# Patient Record
Sex: Female | Born: 1945 | Race: White | Hispanic: No | Marital: Married | Smoking: Never smoker
Health system: Southern US, Community
[De-identification: ages and names within clinical notes are randomized; demographics above are authoritative.]

## PROBLEM LIST (undated history)

## (undated) DIAGNOSIS — M1990 Unspecified osteoarthritis, unspecified site: Secondary | ICD-10-CM

## (undated) DIAGNOSIS — Z780 Asymptomatic menopausal state: Secondary | ICD-10-CM

## (undated) DIAGNOSIS — M810 Age-related osteoporosis without current pathological fracture: Secondary | ICD-10-CM

## (undated) DIAGNOSIS — D72820 Lymphocytosis (symptomatic): Secondary | ICD-10-CM

## (undated) DIAGNOSIS — H9190 Unspecified hearing loss, unspecified ear: Secondary | ICD-10-CM

## (undated) DIAGNOSIS — R2242 Localized swelling, mass and lump, left lower limb: Secondary | ICD-10-CM

## (undated) DIAGNOSIS — G4733 Obstructive sleep apnea (adult) (pediatric): Secondary | ICD-10-CM

## (undated) DIAGNOSIS — E559 Vitamin D deficiency, unspecified: Secondary | ICD-10-CM

## (undated) DIAGNOSIS — E785 Hyperlipidemia, unspecified: Secondary | ICD-10-CM

## (undated) DIAGNOSIS — G4730 Sleep apnea, unspecified: Secondary | ICD-10-CM

## (undated) DIAGNOSIS — K219 Gastro-esophageal reflux disease without esophagitis: Secondary | ICD-10-CM

## (undated) DIAGNOSIS — R42 Dizziness and giddiness: Secondary | ICD-10-CM

## (undated) DIAGNOSIS — J309 Allergic rhinitis, unspecified: Secondary | ICD-10-CM

## (undated) DIAGNOSIS — R87811 Vaginal high risk human papillomavirus (HPV) DNA test positive: Secondary | ICD-10-CM

## (undated) DIAGNOSIS — T7840XA Allergy, unspecified, initial encounter: Secondary | ICD-10-CM

## (undated) DIAGNOSIS — R87620 Atypical squamous cells of undetermined significance on cytologic smear of vagina (ASC-US): Secondary | ICD-10-CM

## (undated) DIAGNOSIS — M8580 Other specified disorders of bone density and structure, unspecified site: Secondary | ICD-10-CM

## (undated) DIAGNOSIS — J343 Hypertrophy of nasal turbinates: Secondary | ICD-10-CM

## (undated) DIAGNOSIS — Z91018 Allergy to other foods: Secondary | ICD-10-CM

## (undated) DIAGNOSIS — Z8719 Personal history of other diseases of the digestive system: Secondary | ICD-10-CM

## (undated) DIAGNOSIS — Z9989 Dependence on other enabling machines and devices: Secondary | ICD-10-CM

## (undated) DIAGNOSIS — E041 Nontoxic single thyroid nodule: Secondary | ICD-10-CM

## (undated) DIAGNOSIS — Z66 Do not resuscitate: Secondary | ICD-10-CM

## (undated) DIAGNOSIS — I10 Essential (primary) hypertension: Secondary | ICD-10-CM

## (undated) HISTORY — DX: Hyperlipidemia, unspecified: E78.5

## (undated) HISTORY — DX: Allergic rhinitis, unspecified: J30.9

## (undated) HISTORY — DX: Unspecified hearing loss, unspecified ear: H91.90

## (undated) HISTORY — DX: Other specified disorders of bone density and structure, unspecified site: M85.80

## (undated) HISTORY — DX: Atypical squamous cells of undetermined significance on cytologic smear of vagina (ASC-US): R87.811

## (undated) HISTORY — DX: Obstructive sleep apnea (adult) (pediatric): G47.33

## (undated) HISTORY — DX: Allergy to other foods: Z91.018

## (undated) HISTORY — DX: Nontoxic single thyroid nodule: E04.1

## (undated) HISTORY — DX: Sleep apnea, unspecified: G47.30

## (undated) HISTORY — DX: Dizziness and giddiness: R42

## (undated) HISTORY — DX: Personal history of other diseases of the digestive system: Z87.19

## (undated) HISTORY — DX: Hypertrophy of nasal turbinates: J34.3

## (undated) HISTORY — DX: Vaginal high risk human papillomavirus (HPV) DNA test positive: R87.620

## (undated) HISTORY — PX: BREAST CYST ASPIRATION: SHX578

## (undated) HISTORY — DX: Do not resuscitate: Z66

## (undated) HISTORY — PX: CHOLECYSTECTOMY: SHX55

## (undated) HISTORY — DX: Dependence on other enabling machines and devices: Z99.89

## (undated) HISTORY — DX: Essential (primary) hypertension: I10

## (undated) HISTORY — DX: Unspecified osteoarthritis, unspecified site: M19.90

## (undated) HISTORY — DX: Allergy, unspecified, initial encounter: T78.40XA

## (undated) HISTORY — DX: Vitamin D deficiency, unspecified: E55.9

## (undated) HISTORY — DX: Lymphocytosis (symptomatic): D72.820

## (undated) HISTORY — DX: Asymptomatic menopausal state: Z78.0

## (undated) HISTORY — DX: Gastro-esophageal reflux disease without esophagitis: K21.9

---
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
## 1980-09-09 HISTORY — PX: DILATION AND CURETTAGE OF UTERUS: SHX78

## 1988-09-09 HISTORY — PX: CHOLECYSTECTOMY: SHX55

## 2008-04-25 HISTORY — PX: COLONOSCOPY: SHX174

## 2008-04-25 LAB — HM COLONOSCOPY

## 2011-08-13 DIAGNOSIS — M7741 Metatarsalgia, right foot: Secondary | ICD-10-CM | POA: Insufficient documentation

## 2012-04-15 DIAGNOSIS — R1030 Lower abdominal pain, unspecified: Secondary | ICD-10-CM | POA: Insufficient documentation

## 2012-08-13 DIAGNOSIS — Z87898 Personal history of other specified conditions: Secondary | ICD-10-CM | POA: Insufficient documentation

## 2012-08-13 DIAGNOSIS — H9190 Unspecified hearing loss, unspecified ear: Secondary | ICD-10-CM | POA: Insufficient documentation

## 2012-10-27 DIAGNOSIS — G4733 Obstructive sleep apnea (adult) (pediatric): Secondary | ICD-10-CM | POA: Insufficient documentation

## 2013-03-30 DIAGNOSIS — M79673 Pain in unspecified foot: Secondary | ICD-10-CM | POA: Insufficient documentation

## 2013-03-30 DIAGNOSIS — M659 Synovitis and tenosynovitis, unspecified: Secondary | ICD-10-CM | POA: Insufficient documentation

## 2013-06-09 DIAGNOSIS — M8580 Other specified disorders of bone density and structure, unspecified site: Secondary | ICD-10-CM

## 2013-06-09 HISTORY — DX: Other specified disorders of bone density and structure, unspecified site: M85.80

## 2013-07-05 LAB — HM DEXA SCAN

## 2013-07-14 ENCOUNTER — Ambulatory Visit: Payer: Self-pay | Admitting: Family Medicine

## 2013-09-09 HISTORY — PX: CARPAL TUNNEL RELEASE: SHX101

## 2013-09-10 ENCOUNTER — Ambulatory Visit: Payer: Self-pay | Admitting: Internal Medicine

## 2013-10-07 ENCOUNTER — Ambulatory Visit: Payer: Self-pay | Admitting: Family Medicine

## 2013-10-07 LAB — HM MAMMOGRAPHY

## 2014-02-14 DIAGNOSIS — Z9889 Other specified postprocedural states: Secondary | ICD-10-CM | POA: Insufficient documentation

## 2014-10-06 DIAGNOSIS — G4733 Obstructive sleep apnea (adult) (pediatric): Secondary | ICD-10-CM | POA: Diagnosis not present

## 2014-10-07 DIAGNOSIS — R42 Dizziness and giddiness: Secondary | ICD-10-CM | POA: Diagnosis not present

## 2014-10-07 DIAGNOSIS — J329 Chronic sinusitis, unspecified: Secondary | ICD-10-CM | POA: Diagnosis not present

## 2014-10-28 DIAGNOSIS — J343 Hypertrophy of nasal turbinates: Secondary | ICD-10-CM | POA: Diagnosis not present

## 2014-10-28 DIAGNOSIS — K219 Gastro-esophageal reflux disease without esophagitis: Secondary | ICD-10-CM | POA: Diagnosis not present

## 2014-10-28 DIAGNOSIS — I10 Essential (primary) hypertension: Secondary | ICD-10-CM | POA: Diagnosis not present

## 2014-10-28 DIAGNOSIS — E780 Pure hypercholesterolemia: Secondary | ICD-10-CM | POA: Diagnosis not present

## 2014-10-28 DIAGNOSIS — M8580 Other specified disorders of bone density and structure, unspecified site: Secondary | ICD-10-CM | POA: Diagnosis not present

## 2014-11-09 DIAGNOSIS — K219 Gastro-esophageal reflux disease without esophagitis: Secondary | ICD-10-CM | POA: Diagnosis not present

## 2014-11-24 ENCOUNTER — Ambulatory Visit: Payer: Self-pay | Admitting: Gastroenterology

## 2014-11-24 DIAGNOSIS — R12 Heartburn: Secondary | ICD-10-CM | POA: Diagnosis not present

## 2014-11-24 DIAGNOSIS — K2970 Gastritis, unspecified, without bleeding: Secondary | ICD-10-CM | POA: Diagnosis not present

## 2014-11-24 DIAGNOSIS — K3189 Other diseases of stomach and duodenum: Secondary | ICD-10-CM | POA: Diagnosis not present

## 2014-11-24 DIAGNOSIS — K219 Gastro-esophageal reflux disease without esophagitis: Secondary | ICD-10-CM | POA: Diagnosis not present

## 2014-11-24 DIAGNOSIS — K319 Disease of stomach and duodenum, unspecified: Secondary | ICD-10-CM | POA: Diagnosis not present

## 2014-11-24 DIAGNOSIS — K449 Diaphragmatic hernia without obstruction or gangrene: Secondary | ICD-10-CM | POA: Diagnosis not present

## 2014-11-30 DIAGNOSIS — Z23 Encounter for immunization: Secondary | ICD-10-CM | POA: Diagnosis not present

## 2014-11-30 DIAGNOSIS — Z0000 Encounter for general adult medical examination without abnormal findings: Secondary | ICD-10-CM | POA: Diagnosis not present

## 2014-11-30 DIAGNOSIS — D72820 Lymphocytosis (symptomatic): Secondary | ICD-10-CM | POA: Diagnosis not present

## 2014-12-28 DIAGNOSIS — R87619 Unspecified abnormal cytological findings in specimens from cervix uteri: Secondary | ICD-10-CM | POA: Diagnosis not present

## 2014-12-28 DIAGNOSIS — R87810 Cervical high risk human papillomavirus (HPV) DNA test positive: Secondary | ICD-10-CM | POA: Diagnosis not present

## 2014-12-28 DIAGNOSIS — Z0000 Encounter for general adult medical examination without abnormal findings: Secondary | ICD-10-CM | POA: Diagnosis not present

## 2014-12-28 LAB — HM PAP SMEAR

## 2015-01-03 ENCOUNTER — Other Ambulatory Visit: Payer: Self-pay

## 2015-01-03 DIAGNOSIS — Z1231 Encounter for screening mammogram for malignant neoplasm of breast: Secondary | ICD-10-CM

## 2015-01-06 ENCOUNTER — Ambulatory Visit: Payer: Medicare Other | Attending: Family Medicine | Admitting: Rehabilitative and Restorative Service Providers"

## 2015-01-06 ENCOUNTER — Encounter: Payer: Self-pay | Admitting: Rehabilitative and Restorative Service Providers"

## 2015-01-06 DIAGNOSIS — R269 Unspecified abnormalities of gait and mobility: Secondary | ICD-10-CM

## 2015-01-06 DIAGNOSIS — H8111 Benign paroxysmal vertigo, right ear: Secondary | ICD-10-CM

## 2015-01-06 NOTE — Therapy (Signed)
[facility] [address] [place], [place], [address] Phone: [phone]   Fax:  [phone]  Physical Therapy Evaluation  Patient Details  Name: Kathy Baldwin MRN: [id number] Date of Birth: 1946-04-02 Referring Provider:  Kerman Passey, MD  Encounter Date: 01/06/2015      PT End of Session - 01/06/15 1605    Visit Number 1   Number of Visits 8   Date for PT Re-Evaluation 03/08/15   Authorization Type G code every 10th visit   PT Start Time 1018   PT Stop Time 1105   PT Time Calculation (min) 47 min   Activity Tolerance Patient tolerated treatment well   Behavior During Therapy [facility] for tasks assessed/performed      Past Medical History  Diagnosis Date  . Hypertension     Past Surgical History  Procedure Laterality Date  . Carpal tunnel release Right 2015  . Cholecystectomy  15-20 years ago    There were no vitals filed for this visit.  Visit Diagnosis:  BPPV (benign paroxysmal positional vertigo), right  Abnormality of gait      Subjective Assessment - 01/06/15 1023    Subjective The patient reports onset of intermittent vertigo attacks that began 2-3 months ago.  Symptoms are describd as sensation of room spinning (first attack occurred when looking up), disorientation, imbalance lasting x hours.  She reports these attacks occur every few days up to a month apart.  She goes to bed for the day when these occur.  She also notes pressure, "fuzzy" sensation, difficulty thinking, reports of hearing diminished (hearing checked in 2014 with mild hearing loss), no vision changes, negative h/o migraines.  She is avoiding quick turns, quick head movements.   Patient Stated Goals Decrease attacks of vertigo.   Currently in Pain? No/denies         [facility] PT Assessment - 01/06/15 1034    Assessment   Medical Diagnosis --  vertigo   Onset Date --  10/2013   Precautions   Precautions Fall   Balance  Screen   Has the patient fallen in the past 6 months Yes   How many times? --  slide down 4-5 x   Has the patient had a decrease in activity level because of a fear of falling?  Yes   Is the patient reluctant to leave their home because of a fear of falling?  Yes   Home Environment   Living Enviornment Private residence   Living Arrangements Spouse/significant other   Type of Home House   Home Access Stairs to enter   Entrance Stairs-Number of Steps --  3   Home Layout Two level   Additional Comments has to go up/down steps on her bottom   Prior Function   Level of Independence Independent with homemaking with ambulation   Vocation Retired            Vestibular Assessment - 01/06/15 1037    Vestibular Assessment   General Observation reports R ear fullness, with popping noise   Symptom Behavior   Type of Dizziness Spinning  disoriented in space, oscillopsia   Frequency of Dizziness --  intermittent   Duration of Dizziness hours   Aggravating Factors Turning head quickly;Turning body quickly   Relieving Factors Lying supine  on a bad day   Occulomotor Exam   Occulomotor Alignment Abnormal  R eye skew deviation   Spontaneous Absent   Gaze-induced Left beating nystagmus with L gaze  in superior range, saw with frenzels in L gaze   Head shaking Horizontal --  provokes dizziness, no nystagmus noted   Smooth Pursuits Intact   Comment patient describes usually feeling the right side is affected,    Vestibulo-Occular Reflex   VOR 1 Head Only (x 1 viewing) --  at self regulated pace provokes dizziness   VOR Cancellation Normal   Positional Testing   Dix-Hallpike Dix-Hallpike Right;Dix-Hallpike Left   Sidelying Test Sidelying Right;Sidelying Left   Horizontal Canal Testing Horizontal Canal Right;Horizontal Canal Left   Dix-Hallpike Right   Dix-Hallpike Right Duration --  seconds   Dix-Hallpike Right Symptoms Upbeat, right rotatory nystagmus   Dix-Hallpike Left    Dix-Hallpike Left Symptoms No nystagmus   Sidelying Right   Sidelying Right Duration --  approx 30 sec, going down, 7/10 going up   Sidelying Right Symptoms Upbeat, right rotatory nystagmus   Sidelying Left   Sidelying Left Duration --  approx 10 seconds, 4/10 when returns to sitting   Sidelying Left Symptoms Upbeat, left rotatory nystagmus   Horizontal Canal Right   Horizontal Canal Right Symptoms Normal   Horizontal Canal Left   Horizontal Canal Left Symptoms Normal           Vestibular Treatment/Exercise - 01/06/15 1101    Vestibular Treatment/Exercise   Vestibular Treatment Provided Canalith Repositioning   Canalith Repositioning Epley Manuever Right    EPLEY MANUEVER RIGHT   Number of Reps  2   Overall Response Improved Symptoms   Response Details  --  but did not resolve symptoms           PT Long Term Goals - 01/06/15 1614    PT LONG TERM GOAL #1   Title The patient will return demo HEP for habituation, gaze x 1 and high level balance.   Baseline Target date 02/05/2015   Time 4   Period Weeks   PT LONG TERM GOAL #2   Title The patient will be negative for positional testing for vertigo.   Baseline Target date 02/05/2015   Time 4   Period Weeks   PT LONG TERM GOAL #3   Title The patient will tolerate gaze x 1 viewing x 30 seconds with dizziness < or equal to 3/10.   Baseline Target date 02/05/2015   Time 4   Period Weeks   PT LONG TERM GOAL #4   Title The patient will be further assessed on sensory organization testing and goal to follow as indicated.   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  PT to address deficits through vestibular rehab.   Pt will benefit from skilled therapeutic intervention in order to  improve on the following deficits Abnormal gait;Decreased balance;Difficulty walking;Other (comment)  vertigo   Rehab Potential Good   PT Frequency 2x / week   PT Duration 4 weeks   PT Treatment/Interventions Gait training;Therapeutic exercise;Patient/family education;Balance training;Functional mobility training;Neuromuscular re-education;Other (comment)  vestibular rehab   PT Next Visit Plan Check R BPPV, provide brandt daroff, gaze x 1 viewing, balance exercises, check SOT?   Consulted and Agree with Plan of Care Patient          G-Codes - 01/06/15  1620    Functional Assessment Tool Used vertigo   Functional Limitation Self care   Self Care Current Status [phone]) At least 20 percent but less than 40 percent impaired, limited or restricted   Self Care Goal Status (U0454) At least 1 percent but less than 20 percent impaired, limited or restricted       Problem List There are no active problems to display for this patient.   Khamryn Calderone, PT 01/06/2015, 4:20 PM  [place] [facility] [address] [place], [place], [address] Phone: [phone]   Fax:  [phone]

## 2015-01-09 ENCOUNTER — Encounter: Payer: Self-pay | Admitting: Physical Therapy

## 2015-01-09 ENCOUNTER — Ambulatory Visit: Payer: Medicare Other | Attending: Family Medicine | Admitting: Physical Therapy

## 2015-01-09 DIAGNOSIS — H8111 Benign paroxysmal vertigo, right ear: Secondary | ICD-10-CM | POA: Insufficient documentation

## 2015-01-09 DIAGNOSIS — R269 Unspecified abnormalities of gait and mobility: Secondary | ICD-10-CM | POA: Insufficient documentation

## 2015-01-09 NOTE — Patient Instructions (Addendum)
Benign Positional Vertigo Vertigo means you feel like you or your surroundings are moving when they are not. Benign positional vertigo is the most common form of vertigo. Benign means that the cause of your condition is not serious. Benign positional vertigo is more common in older adults. CAUSES  Benign positional vertigo is the result of an upset in the labyrinth system. This is an area in the middle ear that helps control your balance. This may be caused by a viral infection, head injury, or repetitive motion. However, often no specific cause is found. SYMPTOMS  Symptoms of benign positional vertigo occur when you move your head or eyes in different directions. Some of the symptoms may include:  Loss of balance and falls.  Vomiting.  Blurred vision.  Dizziness.  Nausea.  Involuntary eye movements (nystagmus). DIAGNOSIS  Benign positional vertigo is usually diagnosed by physical exam. If the specific cause of your benign positional vertigo is unknown, your caregiver may perform imaging tests, such as magnetic resonance imaging (MRI) or computed tomography (CT). TREATMENT  Your caregiver may recommend movements or procedures to correct the benign positional vertigo. Medicines such as meclizine, benzodiazepines, and medicines for nausea may be used to treat your symptoms. In rare cases, if your symptoms are caused by certain conditions that affect the inner ear, you may need surgery. HOME CARE INSTRUCTIONS   Follow your caregiver's instructions.  Move slowly. Do not make sudden body or head movements.  Avoid driving.  Avoid operating heavy machinery.  Avoid performing any tasks that would be dangerous to you or others during a vertigo episode.  Drink enough fluids to keep your urine clear or pale yellow. SEEK IMMEDIATE MEDICAL CARE IF:   You develop problems with walking, weakness, numbness, or using your arms, hands, or legs.  You have difficulty speaking.  You develop  severe headaches.  Your nausea or vomiting continues or gets worse.  You develop visual changes.  Your family or friends notice any behavioral changes.  Your condition gets worse.  You have a fever.  You develop a stiff neck or sensitivity to light. MAKE SURE YOU:   Understand these instructions.  Will watch your condition.  Will get help right away if you are not doing well or get worse. Document Released: 06/03/2006 Document Revised: 11/18/2011 Document Reviewed: 05/16/2011 [facility] Patient Information 2015 [employer] [place]. This information is not intended to replace advice given to you by your health care provider. Make sure you discuss any questions you have with your health care provider. Self Treatment for Left Posterior / Anterior Canalithiasis   Sitting on bed: 1. Turn head 45 left. (a) Lie back slowly, shoulders on pillow, head on bed. (b) Hold __30__ seconds. 2. Keeping head on bed, turn head 90 right. Hold __30__ seconds. 3. Roll to right, head on 45 angle down toward bed. Hold _30___ seconds. 4. Sit up on right side of bed. Repeat __5__ times per session. Do __3-5__ sessions per day.  Copyright  VHI. All rights reserved.  SINGLE LIMB STANCE   Stance: single leg on floor. Raise leg. Hold ___ seconds. Repeat with other leg. ___ reps per set, ___ sets per day, ___ days per week  Copyright  VHI. All rights reserved.

## 2015-01-09 NOTE — Therapy (Signed)
[place] [address] [place], [place], [address] Phone: [phone]   Fax:  [phone]  Physical Therapy Treatment  Patient Details  Name: Kathy Baldwin MRN: [id number] Date of Birth: 10-16-1945 Referring Provider:  Arnetha Courser, MD  Encounter Date: 01/09/2015      PT End of Session - 01/09/15 0845    Visit Number 2  G2   Number of Visits 8   Date for PT Re-Evaluation 03/08/15   Authorization Type G code every 10th visit   PT Start Time 0800   PT Stop Time 0840   PT Time Calculation (min) 40 min      Past Medical History  Diagnosis Date  . Hypertension     Past Surgical History  Procedure Laterality Date  . Carpal tunnel release Right 2015  . Cholecystectomy  15-20 years ago    There were no vitals filed for this visit.  Visit Diagnosis:  BPPV (benign paroxysmal positional vertigo), right      Subjective Assessment - 01/09/15 0839    Subjective Pt. reports very little vertigo has been experienced since treatment received (Epley's) on Friday; states husband did the Epley maneuver for her on Saturday at which time she had a little vertigo; states she has some pressure in forehead which may be due to sinus problem   Patient is accompained by: Family member  husband   Patient Stated Goals Decrease attacks of vertigo.   Currently in Pain? No/denies     Pt. Performed standing balance on foam in corner with EO and EC and with head turns - no significant LOB occurred Instructed in Brandt-Daroff - pt. Performed 1 rep with no c/o vertigo ; instructed in balance exercises - tandem stance, Single limb stance, ankle sways and marching on floor and on compliant surface; also standing with narrow BOS on floor And on pillow with head turns - no LOB ocurred Husband present - both pt and spouse stated that they had taken balance classes at [employer]           Vestibular Assessment -  01/09/15 0849    Positional Testing   Dix-Hallpike Dix-Hallpike Right;Dix-Hallpike Left   Sidelying Test Sidelying Right;Sidelying Left   Dix-Hallpike Right   Dix-Hallpike Right Duration No vertigo   Dix-Hallpike Left   Dix-Hallpike Left Duration No vertigo   Sidelying Right   Sidelying Right Duration no vertigo   Sidelying Left   Sidelying Left Duration no vertigo   Positional Sensitivities   Rolling Right No dizziness   Rolling Left No dizziness                         PT Education - 01/09/15 0844    Education provided Yes   Education Details BPPV information; Brandt-Daroff exercises and balance exercises including single limb and tandem stance   Person(s) Educated Patient;Spouse   Methods Explanation;Demonstration;Handout   Comprehension Verbalized understanding;Returned demonstration          PT Short Term Goals - 01/06/15 1609    PT SHORT TERM GOAL #1   Title --           PT Long Term Goals - 01/09/15 0856    PT LONG TERM GOAL #2   Title The patient will be negative for positional testing for vertigo.   Baseline 01-09-15 met   Status Achieved  Plan - 01/09/15 0852    Clinical Impression Statement Pt.'s R BPPV appears to have resolved with treatment of Epley's maneuver; pt. has no c/o vertigo and no nystagmus observed with Dix-Hallpike, Brandt-Daroff, or rolling during positional testing; pt able to maintain balance on foam iwth headturns and with EC and EO - no signiificant LOB occurred    Pt will benefit from skilled therapeutic intervention in order to improve on the following deficits Abnormal gait;Decreased balance;Difficulty walking;Other (comment)   Rehab Potential Good   PT Frequency 2x / week   PT Duration 4 weeks   PT Treatment/Interventions Gait training;Therapeutic exercise;Patient/family education;Balance training;Functional mobility training;Neuromuscular re-education;Other (comment)   PT Next Visit Plan Pt.  wishes to cancel remaining appt. this week due to not having vertigo at this time - states she wishes to be placed on hold and will call back for appt as needed   Consulted and Agree with Plan of Care Patient;Family member/caregiver   Family Member Consulted spouse        Problem List There are no active problems to display for this patient.   Alda Lea, PT 01/09/2015, 9:03 AM  [facility] [address] [place], [place], [address] Phone: [phone]   Fax:  [phone]

## 2015-01-12 ENCOUNTER — Encounter: Payer: Medicare Other | Admitting: Rehabilitative and Restorative Service Providers"

## 2015-01-13 ENCOUNTER — Ambulatory Visit
Admission: RE | Admit: 2015-01-13 | Discharge: 2015-01-13 | Disposition: A | Discharge status: Home / Self Care | Payer: Medicare Other | Source: Ambulatory Visit | Attending: Family Medicine | Admitting: Family Medicine

## 2015-01-13 ENCOUNTER — Ambulatory Visit: Payer: Self-pay

## 2015-01-13 ENCOUNTER — Other Ambulatory Visit: Payer: Self-pay

## 2015-01-13 ENCOUNTER — Ambulatory Visit: Payer: Self-pay | Admitting: Physical Therapy

## 2015-01-13 DIAGNOSIS — Z1231 Encounter for screening mammogram for malignant neoplasm of breast: Secondary | ICD-10-CM | POA: Insufficient documentation

## 2015-01-17 ENCOUNTER — Encounter: Payer: Medicare Other | Admitting: Physical Therapy

## 2015-01-20 ENCOUNTER — Encounter: Payer: Medicare Other | Admitting: Rehabilitative and Restorative Service Providers"

## 2015-01-23 ENCOUNTER — Encounter: Payer: Medicare Other | Admitting: Rehabilitative and Restorative Service Providers"

## 2015-01-25 ENCOUNTER — Encounter: Payer: Medicare Other | Admitting: Rehabilitative and Restorative Service Providers"

## 2015-01-30 ENCOUNTER — Encounter: Payer: Medicare Other | Admitting: Rehabilitative and Restorative Service Providers"

## 2015-01-30 DIAGNOSIS — G4733 Obstructive sleep apnea (adult) (pediatric): Secondary | ICD-10-CM | POA: Diagnosis not present

## 2015-02-01 ENCOUNTER — Encounter: Payer: Medicare Other | Admitting: Rehabilitative and Restorative Service Providers"

## 2015-02-14 ENCOUNTER — Encounter: Payer: Medicare Other | Admitting: Rehabilitative and Restorative Service Providers"

## 2015-02-17 ENCOUNTER — Encounter: Payer: Medicare Other | Admitting: Rehabilitative and Restorative Service Providers"

## 2015-03-02 ENCOUNTER — Other Ambulatory Visit: Payer: Self-pay | Admitting: Family Medicine

## 2015-03-03 NOTE — Telephone Encounter (Signed)
Routing to provider  

## 2015-03-10 ENCOUNTER — Encounter: Payer: Self-pay | Admitting: Rehabilitative and Restorative Service Providers"

## 2015-03-10 NOTE — Therapy (Signed)
[facility] [address] [place], [place], [address] Phone: [phone]   Fax:  [phone]  Patient Details  Name: Kathy Baldwin MRN: [id number] Date of Birth: [birth year] Referring Provider:  No ref. provider found  Encounter Date: last encounter on 01/09/2015  PHYSICAL THERAPY DISCHARGE SUMMARY  Visits from Start of Care: 2  Current functional level related to goals / functional outcomes:     PT Long Term Goals - 01/09/15 0856    PT LONG TERM GOAL #2   Title The patient will be negative for positional testing for vertigo.   Baseline 01-09-15 met   Status Achieved        Remaining deficits: None noted on second visit, patient had cancelled remaining visits.   Education / Equipment: HEP for vertigo.  Plan: Patient agrees to discharge.  Patient goals were met. Patient is being discharged due to meeting the stated rehab goals.  ?????        Debra Colon, PT 03/10/2015, 9:45 AM  [facility] [address] [place], [place], [address] Phone: [phone]   Fax:  [phone]

## 2015-03-28 ENCOUNTER — Telehealth: Payer: Self-pay

## 2015-03-28 ENCOUNTER — Other Ambulatory Visit: Payer: Self-pay

## 2015-03-28 ENCOUNTER — Other Ambulatory Visit: Payer: Self-pay | Admitting: Family Medicine

## 2015-03-28 DIAGNOSIS — J309 Allergic rhinitis, unspecified: Secondary | ICD-10-CM | POA: Insufficient documentation

## 2015-03-28 DIAGNOSIS — D72820 Lymphocytosis (symptomatic): Secondary | ICD-10-CM

## 2015-03-28 DIAGNOSIS — K2100 Gastro-esophageal reflux disease with esophagitis, without bleeding: Secondary | ICD-10-CM

## 2015-03-28 DIAGNOSIS — J3089 Other allergic rhinitis: Secondary | ICD-10-CM

## 2015-03-28 DIAGNOSIS — E785 Hyperlipidemia, unspecified: Secondary | ICD-10-CM

## 2015-03-28 DIAGNOSIS — Z5181 Encounter for therapeutic drug level monitoring: Secondary | ICD-10-CM | POA: Insufficient documentation

## 2015-03-28 MED ORDER — OMEPRAZOLE 40 MG PO CPDR: 40.0000 mg | DELAYED_RELEASE_CAPSULE | Freq: Every day | ORAL | Status: DC

## 2015-03-28 MED ORDER — RABEPRAZOLE SODIUM 20 MG PO TBEC: 20.0000 mg | DELAYED_RELEASE_TABLET | Freq: Every day | ORAL | Status: DC

## 2015-03-28 NOTE — Telephone Encounter (Signed)
I sent refills as requested Please ask the patient to come in for fasting labs on or after august 22nd; orders entered

## 2015-03-28 NOTE — Telephone Encounter (Signed)
Routing to provider  

## 2015-03-28 NOTE — Telephone Encounter (Signed)
See notes below from [employer] regarding new rx request. [place]ip[place] 20mg  [place] [facility] [place]. Pt advised if this doesn't work then we will need a PH study.     Pt already has paid for a 90 day supply of Dexilant. Will call back when she completes the rx for a new rx for Aciphex. Pt requested I call to get a tier exception for her Dexilant. GF  > From: Midge MiniumWOHL, DARREN MD > To: Hajer Dwyer > Sent: 01/09/2015 5:28 PM > Yes but if that does not work then she may need pH studies to see if reflux is really the problem.  > From: Joanathan Affeldt > To: Midge MiniumWOHL, DARREN MD > Sent: 01/06/2015 4:12 PM > Pt has already taken and failed Pantoprazole, Zantac and Nexium. Can we try Aciphex?  > From: Allyn, Shaunna > To: Khamila Bassinger > Sent: 01/06/2015 4:01 PM > [phone] pt went to get Dexilent and it was 500.00. Can she get something cheaper?

## 2015-03-29 ENCOUNTER — Other Ambulatory Visit: Payer: Self-pay

## 2015-03-29 DIAGNOSIS — J3089 Other allergic rhinitis: Secondary | ICD-10-CM

## 2015-03-29 NOTE — Telephone Encounter (Signed)
She needs a 3 month supply on flonase.

## 2015-03-29 NOTE — Telephone Encounter (Signed)
No answer

## 2015-03-29 NOTE — Telephone Encounter (Signed)
Patient notified, lab appointment scheduled

## 2015-03-30 MED ORDER — FLUTICASONE PROPIONATE 50 MCG/ACT NA SUSP: 2.0000 | Freq: Every day | NASAL | Status: DC

## 2015-04-04 ENCOUNTER — Telehealth: Payer: Self-pay | Admitting: Gastroenterology

## 2015-04-04 ENCOUNTER — Other Ambulatory Visit: Payer: Self-pay

## 2015-04-04 DIAGNOSIS — K2100 Gastro-esophageal reflux disease with esophagitis, without bleeding: Secondary | ICD-10-CM

## 2015-04-04 MED ORDER — OMEPRAZOLE 40 MG PO CPDR: 40.0000 mg | DELAYED_RELEASE_CAPSULE | Freq: Every day | ORAL | Status: DC

## 2015-04-04 NOTE — Telephone Encounter (Signed)
Patient would like her reflux medication called in to the mail pharmacy [facility] and please call patient when done

## 2015-04-04 NOTE — Telephone Encounter (Signed)
Rx sent to [facility]. Pt has been notified.

## 2015-05-02 ENCOUNTER — Other Ambulatory Visit: Payer: Medicare Other

## 2015-05-02 DIAGNOSIS — E785 Hyperlipidemia, unspecified: Secondary | ICD-10-CM

## 2015-05-02 DIAGNOSIS — D72820 Lymphocytosis (symptomatic): Secondary | ICD-10-CM

## 2015-05-02 DIAGNOSIS — Z5181 Encounter for therapeutic drug level monitoring: Secondary | ICD-10-CM | POA: Diagnosis not present

## 2015-05-03 LAB — LIPID PANEL W/O CHOL/HDL RATIO
Cholesterol, Total: 187 mg/dL (ref 100–199)
HDL: 72 mg/dL (ref >39)
LDL Calculated: 95 mg/dL (ref 0–99)
TRIGLYCERIDES: 101 mg/dL (ref 0–149)
VLDL CHOLESTEROL CAL: 20 mg/dL (ref 5–40)

## 2015-05-03 LAB — COMPREHENSIVE METABOLIC PANEL
ALK PHOS: 107 IU/L (ref 39–117)
ALT: 17 IU/L (ref 0–32)
AST: 25 IU/L (ref 0–40)
Albumin/Globulin Ratio: 1.7 (ref 1.1–2.5)
Albumin: 4.3 g/dL (ref 3.5–4.8)
BILIRUBIN TOTAL: 0.7 mg/dL (ref 0.0–1.2)
BUN / CREAT RATIO: 15 (ref 11–26)
BUN: 13 mg/dL (ref 8–27)
CHLORIDE: 103 mmol/L (ref 97–108)
CO2: 26 mmol/L (ref 18–29)
CREATININE: 0.86 mg/dL (ref 0.57–1.00)
Calcium: 9.9 mg/dL (ref 8.7–10.3)
GFR calc Af Amer: 79 mL/min/{1.73_m2} (ref >59)
GFR calc non Af Amer: 69 mL/min/{1.73_m2} (ref >59)
GLOBULIN, TOTAL: 2.5 g/dL (ref 1.5–4.5)
Glucose: 95 mg/dL (ref 65–99)
POTASSIUM: 5 mmol/L (ref 3.5–5.2)
SODIUM: 144 mmol/L (ref 134–144)
Total Protein: 6.8 g/dL (ref 6.0–8.5)

## 2015-05-03 LAB — CBC WITH DIFFERENTIAL/PLATELET
BASOS: 0 %
Basophils Absolute: 0 10*3/uL (ref 0.0–0.2)
EOS (ABSOLUTE): 0.2 10*3/uL (ref 0.0–0.4)
EOS: 3 %
HEMATOCRIT: 41.9 % (ref 34.0–46.6)
Hemoglobin: 14.2 g/dL (ref 11.1–15.9)
IMMATURE GRANS (ABS): 0 10*3/uL (ref 0.0–0.1)
IMMATURE GRANULOCYTES: 0 %
LYMPHS: 50 %
Lymphocytes Absolute: 3.6 10*3/uL — ABNORMAL HIGH (ref 0.7–3.1)
MCH: 30.1 pg (ref 26.6–33.0)
MCHC: 33.9 g/dL (ref 31.5–35.7)
MCV: 89 fL (ref 79–97)
Monocytes Absolute: 0.5 10*3/uL (ref 0.1–0.9)
Monocytes: 7 %
NEUTROS PCT: 40 %
Neutrophils Absolute: 2.9 10*3/uL (ref 1.4–7.0)
Platelets: 267 10*3/uL (ref 150–379)
RBC: 4.72 x10E6/uL (ref 3.77–5.28)
RDW: 13.8 % (ref 12.3–15.4)
WBC: 7.3 10*3/uL (ref 3.4–10.8)

## 2015-05-09 ENCOUNTER — Encounter: Payer: Self-pay | Admitting: Family Medicine

## 2015-05-17 DIAGNOSIS — K219 Gastro-esophageal reflux disease without esophagitis: Secondary | ICD-10-CM | POA: Insufficient documentation

## 2015-05-17 DIAGNOSIS — R87811 Vaginal high risk human papillomavirus (HPV) DNA test positive: Secondary | ICD-10-CM

## 2015-05-17 DIAGNOSIS — D72820 Lymphocytosis (symptomatic): Secondary | ICD-10-CM | POA: Insufficient documentation

## 2015-05-17 DIAGNOSIS — J343 Hypertrophy of nasal turbinates: Secondary | ICD-10-CM | POA: Insufficient documentation

## 2015-05-17 DIAGNOSIS — E785 Hyperlipidemia, unspecified: Secondary | ICD-10-CM | POA: Insufficient documentation

## 2015-05-17 DIAGNOSIS — Z9989 Dependence on other enabling machines and devices: Secondary | ICD-10-CM

## 2015-05-17 DIAGNOSIS — I10 Essential (primary) hypertension: Secondary | ICD-10-CM | POA: Insufficient documentation

## 2015-05-17 DIAGNOSIS — R87620 Atypical squamous cells of undetermined significance on cytologic smear of vagina (ASC-US): Secondary | ICD-10-CM | POA: Insufficient documentation

## 2015-05-17 DIAGNOSIS — J309 Allergic rhinitis, unspecified: Secondary | ICD-10-CM | POA: Insufficient documentation

## 2015-05-17 DIAGNOSIS — G4733 Obstructive sleep apnea (adult) (pediatric): Secondary | ICD-10-CM | POA: Insufficient documentation

## 2015-05-22 DIAGNOSIS — G4733 Obstructive sleep apnea (adult) (pediatric): Secondary | ICD-10-CM | POA: Diagnosis not present

## 2015-05-25 ENCOUNTER — Ambulatory Visit (INDEPENDENT_AMBULATORY_CARE_PROVIDER_SITE_OTHER): Payer: Medicare Other | Admitting: Family Medicine

## 2015-05-25 ENCOUNTER — Encounter: Payer: Self-pay | Admitting: Family Medicine

## 2015-05-25 VITALS — BP 132/69 | HR 67 | Temp 98.2°F | Ht 62.4 in | Wt 164.0 lb

## 2015-05-25 DIAGNOSIS — D72820 Lymphocytosis (symptomatic): Secondary | ICD-10-CM

## 2015-05-25 DIAGNOSIS — R590 Localized enlarged lymph nodes: Secondary | ICD-10-CM | POA: Diagnosis not present

## 2015-05-25 DIAGNOSIS — Z23 Encounter for immunization: Secondary | ICD-10-CM

## 2015-05-25 DIAGNOSIS — R61 Generalized hyperhidrosis: Secondary | ICD-10-CM | POA: Diagnosis not present

## 2015-05-25 NOTE — Patient Instructions (Signed)
Please do keep an eye on your lymph nodes and report any swelling of your glands to me or the hematologist right away We'll have you see the specialist in the next few weeks or few months If you have not heard anything from my staff in a week about any orders/referrals/studies from today, please contact us here to follow-up [phone]

## 2015-05-25 NOTE — Assessment & Plan Note (Signed)
Seems to have resolved; patient to keep an eye on this area and other lymph nodes to see if any swelling; get CT scan if any new lymph node enlargement; refer to heme for evaluation

## 2015-05-25 NOTE — Progress Notes (Signed)
BP 132/69 mmHg  Pulse 67  Temp(Src) 98.2 F (36.8 C)  Ht 5' 2.4" (1.585 m)  Wt 164 lb (74.39 kg)  BMI 29.61 kg/m2  SpO2 99%   Subjective:    Patient ID: Kathy Baldwin, female    DOB: 1946-05-11, 69 y.o.   MRN: [id number]  HPI: Kathy Baldwin is a 69 y.o. female  Chief Complaint  Patient presents with  . re check white blood count   No fevers; does have some night sweats; not extreme though she says; she was about 69 years old She did noticed some swollen glands on the right side of the neck a few weeks ago Energy level is good She still has problems with her right ear, but no vertigo stuff any more; learned how to do her own exercises when she gets a little dizzy No diarrhea, no unexplained weight loss No unusual rash Does have some pain in her hands, but had carpal tunnel surgery done and that goes away No chest pain or trouble breathing  Relevant past medical, surgical, family and social history reviewed and updated as indicated. Interim medical history since our last visit reviewed. Allergies and medications reviewed and updated.  Review of Systems  Constitutional: Positive for chills (felt cold in the house a few weeks ago when lymph nodes were swollen in the neck) and diaphoresis (night sweats). Negative for fever, activity change, appetite change and fatigue.  HENT: Positive for postnasal drip. Negative for congestion and dental problem.   Respiratory: Negative for shortness of breath.   Cardiovascular: Negative for chest pain and leg swelling.  Gastrointestinal: Negative for blood in stool.  Genitourinary: Negative for hematuria.  Musculoskeletal:       Pain in the leg and groin; when she was at [facility] and she had scan done, couldn't find anything, no treatment; no worse than when evaluated at [facility]; actually, a lot better  Allergic/Immunologic: Positive for environmental allergies (uses flonase).  Neurological: Negative for tremors and numbness (maybe carpal  tunnel, alleviated with massage).  Hematological: Positive for adenopathy (previous swelling along right side of neck a couple of weeks ago, resolved). Does not bruise/bleed easily (hardly ever has bruises, one bruise on the right arm).  Psychiatric/Behavioral: Negative for sleep disturbance.   Per HPI unless specifically indicated above     Objective:    BP 132/69 mmHg  Pulse 67  Temp(Src) 98.2 F (36.8 C)  Ht 5' 2.4" (1.585 m)  Wt 164 lb (74.39 kg)  BMI 29.61 kg/m2  SpO2 99%  Wt Readings from Last 3 Encounters:  05/25/15 164 lb (74.39 kg)  11/30/14 165 lb (74.844 kg)    Physical Exam  Constitutional: She appears well-developed and well-nourished. No distress.  Weight stable  Eyes: No scleral icterus.  Cardiovascular: Normal rate and regular rhythm.   Pulmonary/Chest: Effort normal and breath sounds normal.  Abdominal: Soft. She exhibits no distension.  Musculoskeletal: She exhibits no edema.  Lymphadenopathy:       Head (right side): No submental, no submandibular, no preauricular and no posterior auricular adenopathy present.       Head (left side): No submental, no submandibular, no preauricular and no posterior auricular adenopathy present.    She has no cervical adenopathy.    She has no axillary adenopathy.  Neurological: She is alert.  Skin: Skin is warm and dry. No bruising, no ecchymosis and no petechiae noted. She is not diaphoretic. No pallor.  Psychiatric: She has a normal mood and affect. Her  speech is normal and behavior is normal. Judgment and thought content normal. Cognition and memory are normal.      Assessment & Plan:   Problem List Items Addressed This Visit      Immune and Lymphatic   Cervical lymphadenopathy    Seems to have resolved; patient to keep an eye on this area and other lymph nodes to see if any swelling; get CT scan if any new lymph node enlargement; refer to heme for evaluation      Relevant Orders   Ambulatory referral to  Hematology     Other   Lymphocytosis - Primary    Discussed and will refer to hematologist for evaluation since she has had some recent lymph node swelling and night sweats; no imaging or additional labs here today since I anticipate they will do repeat CBC, peripheral smear, CLL panel, etc.      Relevant Orders   Ambulatory referral to Hematology    Other Visit Diagnoses    Immunization due        Relevant Orders    Flu vaccine greater than or equal to 3yo preservative free IM (Completed)    Night sweats        Relevant Orders    Ambulatory referral to Hematology       Follow up plan: No Follow-up on file.

## 2015-05-25 NOTE — Assessment & Plan Note (Signed)
Discussed and will refer to hematologist for evaluation since she has had some recent lymph node swelling and night sweats; no imaging or additional labs here today since I anticipate they will do repeat CBC, peripheral smear, CLL panel, etc.

## 2015-05-29 DIAGNOSIS — G4733 Obstructive sleep apnea (adult) (pediatric): Secondary | ICD-10-CM | POA: Diagnosis not present

## 2015-06-05 ENCOUNTER — Inpatient Hospital Stay: Payer: Medicare Other | Attending: Internal Medicine | Admitting: Internal Medicine

## 2015-06-05 ENCOUNTER — Encounter: Payer: Self-pay | Admitting: Internal Medicine

## 2015-06-05 ENCOUNTER — Inpatient Hospital Stay: Payer: Medicare Other

## 2015-06-05 VITALS — BP 133/79 | HR 71 | Temp 97.7°F | Resp 20 | Ht 62.4 in | Wt 163.1 lb

## 2015-06-05 DIAGNOSIS — Z7982 Long term (current) use of aspirin: Secondary | ICD-10-CM

## 2015-06-05 DIAGNOSIS — R221 Localized swelling, mass and lump, neck: Secondary | ICD-10-CM

## 2015-06-05 DIAGNOSIS — G4730 Sleep apnea, unspecified: Secondary | ICD-10-CM | POA: Diagnosis not present

## 2015-06-05 DIAGNOSIS — K219 Gastro-esophageal reflux disease without esophagitis: Secondary | ICD-10-CM | POA: Diagnosis not present

## 2015-06-05 DIAGNOSIS — M8580 Other specified disorders of bone density and structure, unspecified site: Secondary | ICD-10-CM | POA: Diagnosis not present

## 2015-06-05 DIAGNOSIS — H9190 Unspecified hearing loss, unspecified ear: Secondary | ICD-10-CM | POA: Diagnosis not present

## 2015-06-05 DIAGNOSIS — D72820 Lymphocytosis (symptomatic): Secondary | ICD-10-CM | POA: Diagnosis not present

## 2015-06-05 DIAGNOSIS — J343 Hypertrophy of nasal turbinates: Secondary | ICD-10-CM

## 2015-06-05 DIAGNOSIS — Z780 Asymptomatic menopausal state: Secondary | ICD-10-CM | POA: Diagnosis not present

## 2015-06-05 DIAGNOSIS — Z79899 Other long term (current) drug therapy: Secondary | ICD-10-CM | POA: Diagnosis not present

## 2015-06-05 DIAGNOSIS — Z800 Family history of malignant neoplasm of digestive organs: Secondary | ICD-10-CM

## 2015-06-05 DIAGNOSIS — R61 Generalized hyperhidrosis: Secondary | ICD-10-CM

## 2015-06-05 DIAGNOSIS — Z809 Family history of malignant neoplasm, unspecified: Secondary | ICD-10-CM | POA: Diagnosis not present

## 2015-06-05 DIAGNOSIS — E559 Vitamin D deficiency, unspecified: Secondary | ICD-10-CM

## 2015-06-05 LAB — CBC WITH DIFFERENTIAL/PLATELET
BASOS PCT: 1 %
Basophils Absolute: 0.1 10*3/uL (ref 0–0.1)
EOS ABS: 0.2 10*3/uL (ref 0–0.7)
EOS PCT: 3 %
HCT: 41.9 % (ref 35.0–47.0)
Hemoglobin: 14 g/dL (ref 12.0–16.0)
Lymphocytes Relative: 42 %
Lymphs Abs: 2.7 10*3/uL (ref 1.0–3.6)
MCH: 30 pg (ref 26.0–34.0)
MCHC: 33.4 g/dL (ref 32.0–36.0)
MCV: 89.9 fL (ref 80.0–100.0)
MONO ABS: 0.6 10*3/uL (ref 0.2–0.9)
MONOS PCT: 9 %
NEUTROS PCT: 45 %
Neutro Abs: 2.9 10*3/uL (ref 1.4–6.5)
PLATELETS: 248 10*3/uL (ref 150–440)
RBC: 4.66 MIL/uL (ref 3.80–5.20)
RDW: 13.3 % (ref 11.5–14.5)
WBC: 6.5 10*3/uL (ref 3.6–11.0)

## 2015-06-05 LAB — LACTATE DEHYDROGENASE: LDH: 174 U/L (ref 98–192)

## 2015-06-05 NOTE — Progress Notes (Signed)
[facility] CONSULT NOTE  Patient Care Team: Kerman Passey, MD as PCP - General (Family Medicine)  CHIEF COMPLAINTS/PURPOSE OF CONSULTATION:  Neck swelling and night sweats.   HISTORY OF PRESENTING ILLNESS:  Kathy Baldwin 69 y.o. female with a history of mild lymphocytosis noted in August 2016 has been deferred was for further evaluation of neck adenopathy with night sweats.   Skin August 2016-patient had regular CBC that showed normal white count at 7; mild lymphocytosis at 3.6 [normal 3.1]; normal hemoglobin and platelet count.  More recently patient complained of mild neck adenopathy; which is currently resolved. Patient also complained of intermittent "night sweats". However on further questioning she states this suggests mild; and could be related to ambulate temperature changes.  Patient denies any weight loss; fatigue or frequent infections. Denies any skin rashes. Denies any early satiety.  ROS: A complete 10 point review of system is done which is negative except mentioned above in history of present illness  MEDICAL HISTORY:  Past Medical History  Diagnosis Date  . Hypertension   . Hyperlipidemia   . ASCUS with positive high risk human papillomavirus of vagina     colpo done by Dr. Holly Bodily 2010  . Osteopenia Oct. 2014  . Allergic rhinitis   . GERD (gastroesophageal reflux disease)   . Vitamin D deficiency disease   . OSA on CPAP   . Lymphocytosis   . Hypertrophy of nasal turbinates   . Menopause age 65  . Multiple food allergies     chocolate and cabbage  . Vertigo   . Sleep apnea   . GERD (gastroesophageal reflux disease)   . History of gallstones   . Hearing loss     SURGICAL HISTORY: Past Surgical History  Procedure Laterality Date  . Carpal tunnel release Right 2015  . Cholecystectomy  15-20 years ago  . Breast cyst aspiration Right     negative  . Dilation and curettage of uterus  1982    s/p miscarriage  . Colonoscopy  04/25/08     SOCIAL HISTORY: Social History   Social History  . Marital Status: Married    Spouse Name: N/A  . Number of Children: N/A  . Years of Education: N/A   Occupational History  . Not on file.   Social History Main Topics  . Smoking status: Never Smoker   . Smokeless tobacco: Never Used  . Alcohol Use: 0.0 oz/week    0 Standard drinks or equivalent per week     Comment: occasional  . Drug Use: No  . Sexual Activity:    Partners: Male   Other Topics Concern  . Not on file   Social History Narrative    FAMILY HISTORY: Family History  Problem Relation Age of Onset  . Hypertension Mother   . Lung disease Father     black lung  . Heart disease Father   . Hyperlipidemia Father   . Hypertension Father   . Diabetes Sister   . Emphysema Sister     died of complications of emphyema  . Cancer Brother     unsure of type  . Asthma Daughter   . Deep vein thrombosis Sister   . Thyroid disease Sister   . Colon cancer Other 60    nephew    ALLERGIES:  is allergic to cabbage; chocolate; and statins.  MEDICATIONS:  Current Outpatient Prescriptions  Medication Sig Dispense Refill  . amLODipine (NORVASC) 5 MG tablet Take 1 tablet by  mouth  daily 90 tablet 2  . aspirin EC 81 MG tablet Take 81 mg by mouth daily.    Marland Kitchen atorvastatin (LIPITOR) 10 MG tablet Take 1 tablet (10 mg total) by mouth at bedtime. 90 tablet 0  . calcium carbonate (OSCAL) 1500 (600 CA) MG TABS tablet Take 600 mg of elemental calcium by mouth 2 (two) times daily with a meal.    . Cyanocobalamin (B-12) 2500 MCG TABS Take 1 tablet by mouth daily.    . fluticasone (FLONASE) 50 MCG/ACT nasal spray Place 2 sprays into both nostrils daily. 48 g 3  . omeprazole (PRILOSEC) 40 MG capsule Take 1 capsule (40 mg total) by mouth daily. 90 capsule 3   No current facility-administered medications for this visit.      Marland Kitchen  PHYSICAL EXAMINATION: ECOG PERFORMANCE STATUS: 0 - Asymptomatic  Filed Vitals:   06/05/15 1103   BP: 133/79  Pulse: 71  Temp: 97.7 F (36.5 C)  Resp: 20   Filed Weights   06/05/15 1103  Weight: 163 lb 2.2 oz (73.999 kg)    GENERAL:alert, no distress and comfortable. She is alone. SKIN: skin color, texture, turgor are normal, no rashes or significant lesions EYES: normal, conjunctiva are pink and non-injected, sclera clear OROPHARYNX:no exudate, no erythema and lips, buccal mucosa, and tongue normal  NECK: supple, thyroid normal size, non-tender, without nodularity LYMPH:  no palpable lymphadenopathy in the cervical, axillary or inguinal LUNGS: clear to auscultation and percussion with normal breathing effort HEART: regular rate & rhythm and no murmurs and no lower extremity edema ABDOMEN:abdomen soft, non-tender and normal bowel sounds Musculoskeletal:no cyanosis of digits and no clubbing  PSYCH: alert & oriented x 3 with fluent speech NEURO: no focal motor/sensory deficits  LABORATORY DATA:  I have reviewed the data as listed Lab Results  Component Value Date   WBC 7.3 05/02/2015   HCT 41.9 05/02/2015    Recent Labs  05/02/15 0819  NA 144  K 5.0  CL 103  CO2 26  GLUCOSE 95  BUN 13  CREATININE 0.86  CALCIUM 9.9  GFRNONAA 69  GFRAA 79  PROT 6.8  AST 25  ALT 17  ALKPHOS 107  BILITOT 0.7    RADIOGRAPHIC STUDIES: I have personally reviewed the radiological images as listed and agreed with the findings in the report. No results found.  ASSESSMENT & PLAN:   # Lymphocytosis- with normal white count and hemoglobin platelets. I suspect this is a transient phenomenon possibly from viral infections etc versus clonal B-cell process. Patient is currently asymptomatic; on physical exam no lymphadenopathy felt today.    Today I would recommend follow-up CBC; LDH.  Further workup/follow-up based on results of today's labs.  All questions were answered. The patient knows to call the clinic with any problems, questions or concerns.  Thank you Dr. Sherie Don for allowing  me to participate in the care of your pleasant patient. Please do not hesitate to contact me with questions or concerns in the interim.   I spent  30 counseling the patient face to face. The total time spent in the appointment was  30 mins and more than 50% was on counseling.     Earna Coder, MD 06/05/2015 11:30 AM

## 2015-06-05 NOTE — Progress Notes (Signed)
Cbc- normal; no follow up unless pt has any worsening symptoms/ signs.

## 2015-06-06 ENCOUNTER — Telehealth: Payer: Self-pay | Admitting: *Deleted

## 2015-06-06 NOTE — Telephone Encounter (Signed)
-----   Message from Earna Coder, MD sent at 06/05/2015  4:44 PM EDT ----- Please inform pt that her blood work has come back normal; hence no further follow up are recommended; unless she has any other worsening symptoms.

## 2015-06-06 NOTE — Telephone Encounter (Signed)
Patient notified that the labs which were drawn yesterday are completely normal.  Patient reassured that no return appointment is needed at this time. The patient expressed gratitude for the call.

## 2015-06-07 DIAGNOSIS — G4733 Obstructive sleep apnea (adult) (pediatric): Secondary | ICD-10-CM | POA: Diagnosis not present

## 2015-07-18 ENCOUNTER — Telehealth: Payer: Self-pay | Admitting: Family Medicine

## 2015-07-18 ENCOUNTER — Encounter: Payer: Self-pay | Admitting: Family Medicine

## 2015-07-18 ENCOUNTER — Ambulatory Visit (INDEPENDENT_AMBULATORY_CARE_PROVIDER_SITE_OTHER): Payer: Medicare Other | Admitting: Family Medicine

## 2015-07-18 VITALS — BP 134/74 | HR 63 | Temp 98.7°F | Wt 161.0 lb

## 2015-07-18 DIAGNOSIS — H8112 Benign paroxysmal vertigo, left ear: Secondary | ICD-10-CM

## 2015-07-18 NOTE — Progress Notes (Signed)
BP 134/74 mmHg  Pulse 63  Temp(Src) 98.7 F (37.1 C)  Wt 161 lb (73.029 kg)  SpO2 97%   Subjective:    Patient ID: Kathy Baldwin, female    DOB: 08/06/1946, 69 y.o.   MRN: [id number]  HPI: Kathy Baldwin is a 69 y.o. female  Chief Complaint  Patient presents with  . Dizziness    Patient states that she has had this in the past, she needs a [facility].   DIZZINESS- had BPPV in May, did PT. Has tried eply's manuver without benefit. Can't take meclizine as it makes her sleep. Has only taken full pill, has not cut in half. Duration: Started on Sunday Description of symptoms: lightheaded Duration of episode: seconds Dizziness frequency: recurrent Provoking factors: head movements Aggravating factors:  Head movements Triggered by rolling over in bed: yes Triggered by bending over: yes Aggravated by head movement: yes Aggravated by exertion, coughing, loud noises: no Recent head injury: no Recent or current viral symptoms: no History of vasovagal episodes: no Nausea: no Vomiting: no Tinnitus: no Hearing loss: no Aural fullness: no Headache: yes Photophobia/phonophobia: no Unsteady gait: yes Postural instability: yes Diplopia, dysarthria, dysphagia or weakness: no Related to exertion: no Pallor: no Diaphoresis: yes Dyspnea: no Chest pain: no  Relevant past medical, surgical, family and social history reviewed and updated as indicated. Interim medical history since our last visit reviewed. Allergies and medications reviewed and updated.  Review of Systems  Constitutional: Negative.   Respiratory: Negative.   Cardiovascular: Negative.   Endocrine: Negative.   Neurological: Positive for dizziness, light-headedness and headaches. Negative for tremors, seizures, syncope, facial asymmetry, speech difficulty, weakness and numbness.    Per HPI unless specifically indicated above     Objective:    BP 134/74 mmHg  Pulse 63  Temp(Src)  98.7 F (37.1 C)  Wt 161 lb (73.029 kg)  SpO2 97%  Wt Readings from Last 3 Encounters:  07/18/15 161 lb (73.029 kg)  06/05/15 163 lb 2.2 oz (73.999 kg)  05/25/15 164 lb (74.39 kg)    Physical Exam  Constitutional: She is oriented to person, place, and time. She appears well-developed and well-nourished. No distress.  HENT:  Head: Normocephalic and atraumatic.  Right Ear: Hearing normal.  Left Ear: Hearing normal.  Nose: Nose normal.  Eyes: Conjunctivae and lids are normal. Pupils are equal, round, and reactive to light. Right eye exhibits no discharge. Left eye exhibits no discharge. No scleral icterus. Right eye exhibits normal extraocular motion and no nystagmus. Left eye exhibits nystagmus. Left eye exhibits normal extraocular motion.  Neck: Normal range of motion. Neck supple. No JVD present. No tracheal deviation present. No thyromegaly present.  Cardiovascular: Normal rate, regular rhythm, normal heart sounds and intact distal pulses.  Exam reveals no gallop and no friction rub.   No murmur heard. Pulmonary/Chest: Effort normal and breath sounds normal. No stridor. No respiratory distress. She has no wheezes. She has no rales. She exhibits no tenderness.  Musculoskeletal: Normal range of motion.  Lymphadenopathy:    She has no cervical adenopathy.  Neurological: She is alert and oriented to person, place, and time. She displays normal reflexes. No cranial nerve deficit. She exhibits normal muscle tone. Coordination normal.  Skin: Skin is warm, dry and intact. No rash noted. No erythema. No pallor.  Psychiatric: She has a normal mood and affect. Her speech is normal and behavior is normal. Judgment and thought content normal. Cognition and memory are normal.  Nursing note and vitals reviewed.   Results for orders placed or performed in visit on 06/05/15  Lactate dehydrogenase  Result Value Ref Range   LDH 174 98 - 192 U/L  CBC with Differential  Result Value Ref Range   WBC  6.5 3.6 - 11.0 K/uL   RBC 4.66 3.80 - 5.20 MIL/uL   Hemoglobin 14.0 12.0 - 16.0 g/dL   HCT 16.1 09.6 - 04.5 %   MCV 89.9 80.0 - 100.0 fL   MCH 30.0 26.0 - 34.0 pg   MCHC 33.4 32.0 - 36.0 g/dL   RDW 40.9 81.1 - 91.4 %   Platelets 248 150 - 440 K/uL   Neutrophils Relative % 45 %   Neutro Abs 2.9 1.4 - 6.5 K/uL   Lymphocytes Relative 42 %   Lymphs Abs 2.7 1.0 - 3.6 K/uL   Monocytes Relative 9 %   Monocytes Absolute 0.6 0.2 - 0.9 K/uL   Eosinophils Relative 3 %   Eosinophils Absolute 0.2 0 - 0.7 K/uL   Basophils Relative 1 %   Basophils Absolute 0.1 0 - 0.1 K/uL   Smear Review MANUAL DIFF DONE       Assessment & Plan:   Problem List Items Addressed This Visit    None    Visit Diagnoses    BPPV (benign paroxysmal positional vertigo), left    -  Primary    Reoccurance. Referral back to neurology for PT made today. Will try halving her meclizine or quartering it. Call if not getting better or getting worse.     Relevant Orders    Ambulatory referral to Neurology        Follow up plan: Return if symptoms worsen or fail to improve.

## 2015-07-18 NOTE — Telephone Encounter (Signed)
Pt would like to go back to [facility] but the referral has expired.

## 2015-07-19 ENCOUNTER — Ambulatory Visit: Payer: Medicare Other | Attending: Family Medicine | Admitting: Physical Therapy

## 2015-07-19 DIAGNOSIS — R42 Dizziness and giddiness: Secondary | ICD-10-CM | POA: Diagnosis not present

## 2015-07-19 NOTE — Therapy (Addendum)
[facility] [address] [place], [place], [address] Phone: [phone]   Fax:  [phone]  Physical Therapy Evaluation  Patient Details  Name: Kathy Baldwin MRN: [id number] Date of Birth: 1946-04-06 Referring Provider: Park Liter, DO  Encounter Date: 07/19/2015      PT End of Session - 07/19/15 2140    Visit Number 1   Number of Visits 3  eval + 3 visits   Date for PT Re-Evaluation 08/18/15   Authorization Type [employer] Medicare - G Codes required   PT Start Time 1104   PT Stop Time 1145   PT Time Calculation (min) 41 min   Activity Tolerance Patient tolerated treatment well   Behavior During Therapy [facility] for tasks assessed/performed      Past Medical History  Diagnosis Date  . Hypertension   . Hyperlipidemia   . ASCUS with positive high risk human papillomavirus of vagina     colpo done by Dr. Sabino Gasser 2010  . Osteopenia Oct. 2014  . Allergic rhinitis   . GERD (gastroesophageal reflux disease)   . Vitamin D deficiency disease   . OSA on CPAP   . Lymphocytosis   . Hypertrophy of nasal turbinates   . Menopause age 35  . Multiple food allergies     chocolate and cabbage  . Vertigo   . Sleep apnea   . GERD (gastroesophageal reflux disease)   . History of gallstones   . Hearing loss     Past Surgical History  Procedure Laterality Date  . Carpal tunnel release Right 2015  . Cholecystectomy  15-20 years ago  . Breast cyst aspiration Right     negative  . Dilation and curettage of uterus  1982    s/p miscarriage  . Colonoscopy  04/25/08    There were no vitals filed for this visit.  Visit Diagnosis:  Dizziness and giddiness - Plan: PT plan of care cert/re-cert      Subjective Assessment - 07/19/15 1112    Subjective Pt seen in [facility] in April-May 2016; seen for 2 appts for treatment of R posterior canal BPPV. Pt with onset of vertigo. Pt performed Epley maneuver by herself (taught by PT)when  she felt "kind of weird" over the summer. This time, pt/husband report vertigo is "more stubborn" in that the Epley maneuver does not appear to be mitigating symptoms.   Patient is accompained by: Family member  husband, Barnabas Lister   Pertinent History BPPV   Patient Stated Goals "To quit wobbling"   Currently in Pain? No/denies  has had headache off/on            [facility] PT Assessment - 07/19/15 0001    Assessment   Medical Diagnosis BPPV   Referring Provider Park Liter, DO   Onset Date/Surgical Date 07/15/15   Precautions   Precautions Fall   Balance Screen   Has the patient fallen in the past 6 months No   Has the patient had a decrease in activity level because of a fear of falling?  No   Is the patient reluctant to leave their home because of a fear of falling?  No   Prior Function   Level of Independence Independent            Vestibular Assessment - 07/19/15 0001    Symptom Behavior   Type of Dizziness Spinning   Frequency of Dizziness every morning since Saturday 11/5   Duration of Dizziness between 30 sec  and 1 min   Aggravating Factors Looking up to the ceiling;Mornings;Sitting with head tilted back;Rolling to right;Rolling to left   Relieving Factors Rest;Head stationary   Occulomotor Exam   Occulomotor Alignment Normal   Spontaneous Absent   Gaze-induced Absent   Positional Testing   Dix-Hallpike Dix-Hallpike Right;Dix-Hallpike Left   Sidelying Test Sidelying Right;Sidelying Left   Horizontal Canal Testing Horizontal Canal Right;Horizontal Canal Left   Dix-Hallpike Right   Dix-Hallpike Right Duration Approx 20 seconds   Dix-Hallpike Right Symptoms Upbeat, right rotatory nystagmus   Dix-Hallpike Left   Dix-Hallpike Left Duration NA   Dix-Hallpike Left Symptoms No nystagmus   Sidelying Right   Sidelying Right Duration NA   Sidelying Right Symptoms No nystagmus   Sidelying Left   Sidelying Left Duration NA   Sidelying Left Symptoms No nystagmus    Horizontal Canal Right   Horizontal Canal Right Duration NA   Horizontal Canal Right Symptoms Normal   Horizontal Canal Left   Horizontal Canal Left Duration NA   Horizontal Canal Left Symptoms Normal               [facility] Adult PT Treatment/Exercise - 07/19/15 0001    Transfers   Transfers Sit to Stand;Stand to Sit   Sit to Stand 7: Independent   Stand to Sit 7: Independent   Ambulation/Gait   Ambulation/Gait Yes   Ambulation/Gait Assistance 7: Independent   Ambulation Distance (Feet) 120 Feet   Assistive device None   Gait Pattern Within Functional Limits   Ambulation Surface Level;Indoor         Vestibular Treatment/Exercise - 07/19/15 0001    Vestibular Treatment/Exercise   Vestibular Treatment Provided Canalith Repositioning   Canalith Repositioning Epley Manuever Right    EPLEY MANUEVER RIGHT   Number of Reps  2   Overall Response Symptoms Resolved   Response Details  During second maneuver, pt reports "just a twinge" of dizziness (duration < 3 seconds) in R Halpike position. No symptoms or nystagmus noted in remaining test positions.               PT Education - 07/19/15 2139    Education provided Yes   Education Details Pt eval findings, POC. Recommended pt contact this PT and schedule follow up appt if symptoms reoccur.   Person(s) Educated Patient;Spouse   Methods Explanation   Comprehension Verbalized understanding          PT Short Term Goals - 07/19/15 2149    PT SHORT TERM GOAL #1   Title Positional vertigo testing will be negative to indicate resolved BPPV. Target date: 08/16/15   PT SHORT TERM GOAL #2   Title Pt will decrease DHI score from 54 to < / = 36 to indicate significant decrease in pt-perceived disability due to dizziness. Target date: 08/16/15                  Plan - 07/19/15 2144    Clinical Impression Statement Pt is a 69 y/o F referred to outpatient neuro PT to address recent onset of vertiginous symptoms. PT  evaluation reveals R upbeating torsional nystagmus approximately 20 seconds in duration on R Dix-Hallpike Test. Therefore, unable to rule out R posterior canalithiasis.Pt treated with R Epley Maneuver. Pt will benefit for up to 3 additional sessions, as needed, over the course of the next 4 weeks to address any additional symptoms.     Pt will benefit from skilled therapeutic intervention in order to improve on the following  deficits Dizziness;Decreased balance   Rehab Potential Good   Clinical Impairments Affecting Rehab Potential multiple occurrence of BPPV since 12/2014   PT Frequency Other (comment)  3 total sessions   PT Duration 4 weeks   PT Treatment/Interventions Canalith Repostioning;Vestibular;Balance training;Neuromuscular re-education   PT Next Visit Plan Reassess BPPV.   Consulted and Agree with Plan of Care Patient;Family member/caregiver   Family Member Consulted husband, Windell Norfolk - 2015/07/24 Nov 11, 2153    Functional Assessment Tool Used DHI score 54   Functional Limitation Self care   Self Care Current Status [phone]) At least 40 percent but less than 60 percent impaired, limited or restricted   Self Care Goal Status (O1157) At least 20 percent but less than 40 percent impaired, limited or restricted       Problem List Patient Active Problem List   Diagnosis Date Noted  . Cervical lymphadenopathy 05/25/2015  . Hypertension   . Hyperlipidemia   . 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 GERD (gastroesophageal reflux disease)   . OSA on CPAP   . Lymphocytosis   . Hypertrophy of nasal turbinates   . Hyperlipidemia 03/28/2015  . Medication monitoring encounter 03/28/2015  . Allergic rhinitis 03/28/2015  . Elevated lymphocytes 03/28/2015  . Osteopenia 06/09/2013    Billie Ruddy, PT, [facility] [address] [place], [place], [address] Phone: [phone]   Fax:   [phone] 2015/07/24, 10:00 PM   Name: ANJEL PERFETTI MRN: [id number] Date of Birth: 1945/11/20    PHYSICAL THERAPY DISCHARGE SUMMARY  Visits from Start of Care: 1  Current functional level related to goals / functional outcomes: Unknown, as patient did not return to PT after initial evaluation    Remaining deficits: Unknown, as patient did not return to PT after initial evaluation    Education / Equipment: See above.  Plan: Patient agrees to discharge.  Patient goals were not met. Patient is being discharged due to not returning since the last visit.  ?????             G-Codes - 07/24/15 2153-11-11    Functional Assessment Tool Used DHI score not captured, as patient did not return to PT after initial evaluation.   Functional Limitation Self care   Self Care Current Status [phone]) At least 20 percent but less than 40 percent impaired, limited or restricted   Self Care Goal Status (Q9169) At least 40 percent but less than 60 percent impaired, limited or restricted     Billie Ruddy, PT, [place] [address] [place], [place], [address] Phone: [phone]   Fax:  [phone] 07/26/16, 8:40 AM

## 2015-08-14 ENCOUNTER — Other Ambulatory Visit: Payer: Self-pay | Admitting: Family Medicine

## 2015-08-14 NOTE — Telephone Encounter (Signed)
Routing to provider  

## 2015-08-15 NOTE — Telephone Encounter (Signed)
Labs Aug 2016 reviewed Rx approved Allergy overridden, she is tolerating this

## 2015-08-16 ENCOUNTER — Telehealth: Payer: Self-pay | Admitting: Family Medicine

## 2015-08-16 DIAGNOSIS — Z1159 Encounter for screening for other viral diseases: Secondary | ICD-10-CM

## 2015-08-16 NOTE — Telephone Encounter (Signed)
I entered the test and diagnosis code Misty StanleyLisa can give you the number so you can load it into your favorites Thanks!

## 2015-08-16 NOTE — Telephone Encounter (Signed)
Dr. Sherie DonLada, I don't know which test code to order. Can you put this test in?

## 2015-08-16 NOTE — Telephone Encounter (Signed)
Pt would like to come in for hep c screening.

## 2015-08-28 ENCOUNTER — Telehealth: Payer: Self-pay | Admitting: Family Medicine

## 2015-08-28 NOTE — Telephone Encounter (Signed)
Pt would like a call back regarding hep c screening

## 2015-08-28 NOTE — Telephone Encounter (Signed)
Fast busy on home number.

## 2015-08-28 NOTE — Telephone Encounter (Signed)
Patient just wanted to schedule her lab visit.

## 2015-08-28 NOTE — Telephone Encounter (Signed)
No answer

## 2015-08-29 ENCOUNTER — Other Ambulatory Visit: Payer: Medicare Other

## 2015-08-29 DIAGNOSIS — Z1159 Encounter for screening for other viral diseases: Secondary | ICD-10-CM

## 2015-08-30 LAB — HEPATITIS C ANTIBODY

## 2015-09-25 ENCOUNTER — Telehealth: Payer: Self-pay | Admitting: Family Medicine

## 2015-09-25 ENCOUNTER — Other Ambulatory Visit: Payer: Self-pay | Admitting: Family Medicine

## 2015-09-25 DIAGNOSIS — J3089 Other allergic rhinitis: Secondary | ICD-10-CM

## 2015-09-25 MED ORDER — OMEPRAZOLE 40 MG PO CPDR: 40.0000 mg | DELAYED_RELEASE_CAPSULE | Freq: Every day | ORAL | Status: DC

## 2015-09-25 MED ORDER — FLUTICASONE PROPIONATE 50 MCG/ACT NA SUSP: 2.0000 | Freq: Every day | NASAL | Status: DC

## 2015-09-25 MED ORDER — AMLODIPINE BESYLATE 5 MG PO TABS: 5.0000 mg | ORAL_TABLET | Freq: Every day | ORAL | Status: DC

## 2015-09-25 NOTE — Telephone Encounter (Signed)
Rxs sent I appreciate staff checking on the statin, no refill needed for that

## 2015-09-25 NOTE — Telephone Encounter (Signed)
Pt needs refills on all RX's [facility] to [facility] [place]. 90 day supply of each. Thanks.

## 2015-09-25 NOTE — Telephone Encounter (Signed)
Pt has switched pharmacies for insurance purposes. Please send all RX's to [facility] in [place]. Thanks. Please remove [facility] from [facility] listing.

## 2015-09-25 NOTE — Telephone Encounter (Signed)
Pharmacy updated.

## 2015-09-25 NOTE — Telephone Encounter (Signed)
Routing to provider. Atorvastatin was written on 08/15/15 with a qty of 90, should not be out yet.

## 2015-10-02 ENCOUNTER — Other Ambulatory Visit: Payer: Self-pay | Admitting: Family Medicine

## 2015-10-02 NOTE — Telephone Encounter (Signed)
Rx was filled on 08/15/15 for a qty. Of 90. Was sent to [facility].

## 2015-10-02 NOTE — Telephone Encounter (Signed)
Pt would like refill for atorvastatin sent to [facility] [place]

## 2015-10-03 MED ORDER — ATORVASTATIN CALCIUM 10 MG PO TABS: 10.0000 mg | ORAL_TABLET | Freq: Every day | ORAL | Status: DC

## 2015-10-03 NOTE — Telephone Encounter (Signed)
May 02, 2015 labs reviewed; Rx approved

## 2015-10-03 NOTE — Telephone Encounter (Signed)
She did have an rx sent to [facility], but she states they no longer use them.

## 2015-10-17 ENCOUNTER — Telehealth: Payer: Self-pay

## 2015-10-17 NOTE — Telephone Encounter (Signed)
If we're the ordering / managing provider for the OSA, then that's fine If she has pulm or ENT or neuro do her OSA and CPAP supplies and management, that order should go through them please Thank you

## 2015-10-17 NOTE — Telephone Encounter (Signed)
Patient called and stated that she has a new insurance company and now needs to get her CPAP supplies ordered thru [facility]. Is it ok to send the order to them? The generic order form I have does not require a signature at this time.

## 2015-10-19 NOTE — Telephone Encounter (Signed)
Left message to call.

## 2015-10-20 NOTE — Telephone Encounter (Signed)
Patient states she has not seen anyone else for her OSA. Order faxed to [facility].

## 2015-11-01 DIAGNOSIS — G4733 Obstructive sleep apnea (adult) (pediatric): Secondary | ICD-10-CM | POA: Diagnosis not present

## 2015-11-23 DIAGNOSIS — H2513 Age-related nuclear cataract, bilateral: Secondary | ICD-10-CM | POA: Diagnosis not present

## 2015-12-04 ENCOUNTER — Ambulatory Visit (INDEPENDENT_AMBULATORY_CARE_PROVIDER_SITE_OTHER): Payer: PPO | Admitting: Family Medicine

## 2015-12-04 ENCOUNTER — Encounter: Payer: Self-pay | Admitting: Family Medicine

## 2015-12-04 VITALS — BP 145/68 | HR 54 | Temp 98.4°F | Ht 62.5 in | Wt 158.0 lb

## 2015-12-04 DIAGNOSIS — I10 Essential (primary) hypertension: Secondary | ICD-10-CM

## 2015-12-04 DIAGNOSIS — M8580 Other specified disorders of bone density and structure, unspecified site: Secondary | ICD-10-CM

## 2015-12-04 DIAGNOSIS — Z0000 Encounter for general adult medical examination without abnormal findings: Secondary | ICD-10-CM | POA: Insufficient documentation

## 2015-12-04 DIAGNOSIS — Z5181 Encounter for therapeutic drug level monitoring: Secondary | ICD-10-CM | POA: Diagnosis not present

## 2015-12-04 DIAGNOSIS — E785 Hyperlipidemia, unspecified: Secondary | ICD-10-CM | POA: Diagnosis not present

## 2015-12-04 DIAGNOSIS — R87811 Vaginal high risk human papillomavirus (HPV) DNA test positive: Secondary | ICD-10-CM

## 2015-12-04 DIAGNOSIS — Z1231 Encounter for screening mammogram for malignant neoplasm of breast: Secondary | ICD-10-CM | POA: Diagnosis not present

## 2015-12-04 DIAGNOSIS — R87620 Atypical squamous cells of undetermined significance on cytologic smear of vagina (ASC-US): Secondary | ICD-10-CM

## 2015-12-04 DIAGNOSIS — H9193 Unspecified hearing loss, bilateral: Secondary | ICD-10-CM | POA: Insufficient documentation

## 2015-12-04 DIAGNOSIS — H9192 Unspecified hearing loss, left ear: Secondary | ICD-10-CM

## 2015-12-04 NOTE — Assessment & Plan Note (Signed)
USPSTF grade A and B recommendations reviewed with patient; age-appropriate recommendations, preventive care, screening tests, etc discussed and encouraged; healthy living encouraged; see AVS for patient education given to patient  

## 2015-12-04 NOTE — Progress Notes (Signed)
Patient: Kathy Baldwin, Female    DOB: [birth year], 70 y.o.   MRN: [id number]  Visit Date: 12/04/2015  Today's Provider: Enid Derry, MD   Chief Complaint  Patient presents with  . Annual Exam    Medicare Wellness; mammogram ordered    Subjective:   Kathy Baldwin is a 70 y.o. female who presents today for her Subsequent Annual Wellness Visit.  Caregiver input:  Seeing benefits from therapy and doing exercise at home when needed for BPPV; offered ENT referral if needed in future  USPSTF grade A and B recommendations Alcohol: occasional social Depression:  Depression screen [facility] 2/9 12/04/2015  Decreased Interest 0  Down, Depressed, Hopeless 0  PHQ - 2 Score 0   Hypertension: not checking at home; just over target; sick dog at home last night; up and down all night Obesity: n/a Tobacco use: never smoker HIV, hep B, hep C: politely declined, hep C already done STD testing and prevention (chl/gon/syphilis): little bump down below, like a pimple kind of thing; she'd like to have it checked in April; call before if getting worse Lipids: check today Glucose: check today Colorectal cancer: 2009;  Breast cancer: May 2016 BRCA gene screening: n/a, no fam hx Intimate partner violence: no Cervical cancer screening: went to specialist 2010; repeat in April 2017; last pap April 2016 HPV negative, negative for intraepithelial lesion; reviewed path report Lung cancer: never smoker Osteoporosis: ordered Fall prevention/vitamin D: spends time outdoors; does take calcium with vitamin D AAA:  n/a Aspirin: daily Diet: does not like fast food, tries to eat well Exercise: goes to senior center, active, will start water aerobics Skin cancer: no worrisome moles; scaly dark mole on back  HPI  Review of Systems  Past Medical History  Diagnosis Date  . Hypertension   . Hyperlipidemia   . ASCUS with positive high risk human papillomavirus of vagina     colpo done by Dr. Sabino Gasser 2010   . Osteopenia Oct. 2014  . Allergic rhinitis   . GERD (gastroesophageal reflux disease)   . Vitamin D deficiency disease   . OSA on CPAP   . Lymphocytosis   . Hypertrophy of nasal turbinates   . Menopause age 60  . Multiple food allergies     chocolate and cabbage  . Vertigo   . Sleep apnea   . GERD (gastroesophageal reflux disease)   . History of gallstones   . Hearing loss      Past Surgical History  Procedure Laterality Date  . Carpal tunnel release Right 2015  . Cholecystectomy  15-20 years ago  . Breast cyst aspiration Right     negative  . Dilation and curettage of uterus  1982    s/p miscarriage  . Colonoscopy  04/25/08    Family History  Problem Relation Age of Onset  . Hypertension Mother   . Lung disease Father     black lung  . Heart disease Father   . Hyperlipidemia Father   . Hypertension Father   . Diabetes Sister   . Emphysema Sister     died of complications of emphyema  . Cancer Brother     unsure of type  . Asthma Daughter   . Deep vein thrombosis Sister   . Thyroid disease Sister   . Colon cancer Other 51    nephew    Social History   Social History  . Marital Status: Married    Spouse Name: N/A  . Number of Children:  N/A  . Years of Education: N/A   Occupational History  . Not on file.   Social History Main Topics  . Smoking status: Never Smoker   . Smokeless tobacco: Never Used  . Alcohol Use: 0.0 oz/week    0 Standard drinks or equivalent per week     Comment: occasional  . Drug Use: No  . Sexual Activity:    Partners: Male   Other Topics Concern  . Not on file   Social History Narrative    Outpatient Encounter Prescriptions as of 12/04/2015  Medication Sig  . amLODipine (NORVASC) 5 MG tablet Take 1 tablet (5 mg total) by mouth daily.  Marland Kitchen aspirin EC 81 MG tablet Take 81 mg by mouth daily.  Marland Kitchen atorvastatin (LIPITOR) 10 MG tablet Take 1 tablet (10 mg total) by mouth at bedtime.  . calcium carbonate (OSCAL) 1500 (600 CA)  MG TABS tablet Take 600 mg of elemental calcium by mouth 2 (two) times daily with a meal.  . Cyanocobalamin (B-12) 2500 MCG TABS Take 1 tablet by mouth daily.  . fluticasone (FLONASE) 50 MCG/ACT nasal spray Place 2 sprays into both nostrils daily.  Marland Kitchen omeprazole (PRILOSEC) 40 MG capsule Take 1 capsule (40 mg total) by mouth daily.   No facility-administered encounter medications on file as of 12/04/2015.    Functional Ability / Safety Screening 1.  Was the timed Get Up and Go test longer than 30 seconds?  no 2.  Does the patient need help with the phone, transportation, shopping,      preparing meals, housework, laundry, medications, or managing money?  no 3.  Does the patient's home have:  loose throw rugs in the hallway?   no  (slip protection)      Grab bars in the bathroom? no      Handrails on the stairs?   yes      Poor lighting?   no 4.  Has the patient noticed any hearing difficulties?   yes, husband noticed  Fall Risk Assessment See under rooming  Depression Screen See under rooming Depression screen [facility] 2/9 12/04/2015  Decreased Interest 0  Down, Depressed, Hopeless 0  PHQ - 2 Score 0   Advanced Directives Does patient have a HCPOA?    yes If yes, name and contact information: husband, home number [phone]; 2nd is daughter Kathy Baldwin, [phone] Does patient have a living will or MOST form?  yes  Objective:   Vitals: BP 145/68 mmHg  Pulse 54  Temp(Src) 98.4 F (36.9 C)  Ht 5' 2.5" (1.588 m)  Wt 158 lb (71.668 kg)  BMI 28.42 kg/m2  SpO2 98% Body mass index is 28.42 kg/(m^2). No exam data present  Physical Exam  Constitutional: She appears well-developed and well-nourished.  Cardiovascular: Normal rate.   Pulmonary/Chest: Effort normal.  Skin: No pallor.  Psychiatric: She has a normal mood and affect. Her behavior is normal. Judgment and thought content normal.   Mood/affect:  euthymic Appearance:  Casually dressed, good hygiene  Cognitive  Testing - 6-CIT  Correct? Score   What year is it? yes 0 Yes = 0    No = 4  What month is it? yes 0 Yes = 0    No = 3  Remember:     Pia [address]au, [place], [place]     What time is it? yes 0 Yes = 0    No = 3  Count backwards from 20 to 1 yes 0 Correct =  0    1 error = 2   More than 1 error = 4  Say the months of the year in reverse. yes 0 Correct = 0    1 error = 2   More than 1 error = 4  What address did I ask you to remember? yes 0 Correct = 0  1 error = 2    2 error = 4    3 error = 6    4 error = 8    All wrong = 10       TOTAL SCORE  0/28   Interpretation:  Normal  Normal (0-7) Abnormal (8-28)    Assessment & Plan:     Annual Wellness Visit  Reviewed patient's Family Medical History Reviewed and updated list of patient's medical providers Assessment of cognitive impairment was done Assessed patient's functional ability Established a written schedule for health screening [place] Completed and Reviewed  Exercise Activities and Dietary recommendations Goals    None      Immunization History  Administered Date(s) Administered  . Influenza, Seasonal, Injecte, Preservative Fre 05/25/2015  . Influenza-Unspecified 05/10/2014  . Pneumococcal Conjugate-13 11/30/2014  . Pneumococcal Polysaccharide-23 08/13/2011, 10/01/2013  . Tdap 09/10/2011  . Zoster 08/13/2012    Health Maintenance  Topic Date Due  . INFLUENZA VACCINE  04/09/2016  . MAMMOGRAM  01/12/2017  . COLONOSCOPY  04/25/2018  . TETANUS/TDAP  09/09/2021  . DEXA SCAN  Completed  . ZOSTAVAX  Completed  . Hepatitis C Screening  Completed  . PNA vac Low Risk Adult  Completed     Discussed health benefits of physical activity, and encouraged her to engage in regular exercise appropriate for her age and condition.   No orders of the defined types were placed in this encounter.    Current outpatient prescriptions:  .  amLODipine (NORVASC) 5 MG tablet, Take 1 tablet (5 mg total) by  mouth daily., Disp: 90 tablet, Rfl: 1 .  aspirin EC 81 MG tablet, Take 81 mg by mouth daily., Disp: , Rfl:  .  atorvastatin (LIPITOR) 10 MG tablet, Take 1 tablet (10 mg total) by mouth at bedtime., Disp: 90 tablet, Rfl: 0 .  calcium carbonate (OSCAL) 1500 (600 CA) MG TABS tablet, Take 600 mg of elemental calcium by mouth 2 (two) times daily with a meal., Disp: , Rfl:  .  Cyanocobalamin (B-12) 2500 MCG TABS, Take 1 tablet by mouth daily., Disp: , Rfl:  .  fluticasone (FLONASE) 50 MCG/ACT nasal spray, Place 2 sprays into both nostrils daily., Disp: 48 g, Rfl: 1 .  omeprazole (PRILOSEC) 40 MG capsule, Take 1 capsule (40 mg total) by mouth daily., Disp: 90 capsule, Rfl: 0 There are no discontinued medications.  Next Medicare Wellness Visit in 12+ months  Problem List Items Addressed This Visit      Cardiovascular and Mediastinum   Hypertension    Better on the recheck ;try DASH guidelines        Musculoskeletal and Integument   Osteopenia    Order DEXA; fall precautions, calcium and vitamin D; weight-bearing activity like she is doing      Relevant Orders   DG Bone Density     Other   Hyperlipidemia    Avoid fatty meats, limit saturated fats; check lipids today      Relevant Orders   Lipid Panel w/o Chol/HDL Ratio   Medication monitoring encounter    Check renal and liver function  Relevant Orders   Comprehensive metabolic panel   ASCUS with positive high risk human papillomavirus of vagina    History; last pap was normal; return in late April or early May for next with breast exam      Hearing loss in left ear    Refer to audiology      Relevant Orders   Ambulatory referral to Audiology   Preventative health care - Primary    USPSTF grade A and B recommendations reviewed with patient; age-appropriate recommendations, preventive care, screening tests, etc discussed and encouraged; healthy living encouraged; see AVS for patient education given to patient        Other Visit Diagnoses    Visit for screening mammogram        Relevant Orders    MM SCREENING BREAST TOMO BILATERAL

## 2015-12-04 NOTE — Assessment & Plan Note (Addendum)
Order DEXA; fall precautions, calcium and vitamin D; weight-bearing activity like she is doing

## 2015-12-04 NOTE — Assessment & Plan Note (Signed)
Avoid fatty meats, limit saturated fats; check lipids today

## 2015-12-04 NOTE — Assessment & Plan Note (Signed)
Better on the recheck ;try DASH guidelines

## 2015-12-04 NOTE — Assessment & Plan Note (Signed)
Check renal and liver function 

## 2015-12-04 NOTE — Assessment & Plan Note (Signed)
History; last pap was normal; return in late April or early May for next with breast exam

## 2015-12-04 NOTE — Patient Instructions (Addendum)
Return for a pap smear and breast exam on or after December 29, 2015 at [facility] Please do call to schedule your mammogram and bone density test; the number to schedule one at either [facility] or [facility] is [phone] We'll check to see when you next colonoscopy is (five years or ten years after the date of the last one); if you have not heard from [place] in the next few weeks, please ask me at your next follow-up and we'll make sure we heard back from the gastroenterologist Otherwise, the list of preventive services should be accurate:  Health Maintenance  Topic Date Due  . INFLUENZA VACCINE  04/09/2016  . MAMMOGRAM  01/12/2017  . COLONOSCOPY  04/25/2018  . TETANUS/TDAP  09/09/2021  . DEXA SCAN  Completed  . ZOSTAVAX  Completed  . Hepatitis C Screening  Completed  . PNA vac Low Risk Adult  Completed    Your goal blood pressure is less than 150 mmHg on top. Try to follow the DASH guidelines (DASH stands for Dietary Approaches to Stop Hypertension) Try to limit the sodium in your diet.  Ideally, consume less than 1.5 grams (less than 1,546m) per day. Do not add salt when cooking or at the table.  Check the sodium amount on labels when shopping, and choose items lower in sodium when given a choice. Avoid or limit foods that already contain a lot of sodium. Eat a diet rich in fruits and vegetables and whole grains. Try to limit saturated fats in your diet (bologna, hot dogs, barbeque, cheeseburgers, hamburgers, steak, bacon, sausage, cheese, etc.) and get more fresh fruits, vegetables, and whole grains   Health Maintenance, Female Adopting a healthy lifestyle and getting preventive care can go a long way to promote health and wellness. Talk with your health care provider about what schedule of regular examinations is right for you. This is a good chance for you to check in with your provider about disease prevention and staying healthy. In between checkups,  there are plenty of things you can do on your own. Experts have done a lot of research about which lifestyle changes and preventive measures are most likely to keep you healthy. Ask your health care provider for more information. WEIGHT AND DIET  Eat a healthy diet  Be sure to include plenty of vegetables, fruits, low-fat dairy products, and lean protein.  Do not eat a lot of foods high in solid fats, added sugars, or salt.  Get regular exercise. This is one of the most important things you can do for your health.  Most adults should exercise for at least 150 minutes each week. The exercise should increase your heart rate and make you sweat (moderate-intensity exercise).  Most adults should also do strengthening exercises at least twice a week. This is in addition to the moderate-intensity exercise.  Maintain a healthy weight  Body mass index (BMI) is a measurement that can be used to identify possible weight problems. It estimates body fat based on height and weight. Your health care provider can help determine your BMI and help you achieve or maintain a healthy weight.  For females 21years of age and older:   A BMI below 18.5 is considered underweight.  A BMI of 18.5 to 24.9 is normal.  A BMI of 25 to 29.9 is considered overweight.  A BMI of 30 and above is considered obese.  Watch levels of cholesterol and blood lipids  You should start having your  blood tested for lipids and cholesterol at 72 years of age, then have this test every 5 years.  You may need to have your cholesterol levels checked more often if:  Your lipid or cholesterol levels are high.  You are older than 72 years of age.  You are at high risk for heart disease.  CANCER SCREENING   Lung Cancer  Lung cancer screening is recommended for adults 61-80 years old who are at high risk for lung cancer because of a history of smoking.  A yearly low-dose CT scan of the lungs is recommended for people  who:  Currently smoke.  Have quit within the past 15 years.  Have at least a 30-pack-year history of smoking. A pack year is smoking an average of one pack of cigarettes a day for 1 year.  Yearly screening should continue until it has been 15 years since you quit.  Yearly screening should stop if you develop a health problem that would prevent you from having lung cancer treatment.  Breast Cancer  Practice breast self-awareness. This means understanding how your breasts normally appear and feel.  It also means doing regular breast self-exams. Let your health care provider know about any changes, no matter how small.  If you are in your 20s or 30s, you should have a clinical breast exam (CBE) by a health care provider every 1-3 years as part of a regular health exam.  If you are 74 or older, have a CBE every year. Also consider having a breast X-ray (mammogram) every year.  If you have a family history of breast cancer, talk to your health care provider about genetic screening.  If you are at high risk for breast cancer, talk to your health care provider about having an MRI and a mammogram every year.  Breast cancer gene (BRCA) assessment is recommended for women who have family members with BRCA-related cancers. BRCA-related cancers include:  Breast.  Ovarian.  Tubal.  Peritoneal cancers.  Results of the assessment will determine the need for genetic counseling and BRCA1 and BRCA2 testing. Cervical Cancer Your health care provider may recommend that you be screened regularly for cancer of the pelvic organs (ovaries, uterus, and vagina). This screening involves a pelvic examination, including checking for microscopic changes to the surface of your cervix (Pap test). You may be encouraged to have this screening done every 3 years, beginning at age 64.  For women ages 68-65, health care providers may recommend pelvic exams and Pap testing every 3 years, or they may recommend the  Pap and pelvic exam, combined with testing for human papilloma virus (HPV), every 5 years. Some types of HPV increase your risk of cervical cancer. Testing for HPV may also be done on women of any age with unclear Pap test results.  Other health care providers may not recommend any screening for nonpregnant women who are considered low risk for pelvic cancer and who do not have symptoms. Ask your health care provider if a screening pelvic exam is right for you.  If you have had past treatment for cervical cancer or a condition that could lead to cancer, you need Pap tests and screening for cancer for at least 20 years after your treatment. If Pap tests have been discontinued, your risk factors (such as having a new sexual partner) need to be reassessed to determine if screening should resume. Some women have medical problems that increase the chance of getting cervical cancer. In these cases, your health care  provider may recommend more frequent screening and Pap tests. Colorectal Cancer  This type of cancer can be detected and often prevented.  Routine colorectal cancer screening usually begins at 72 years of age and continues through 72 years of age.  Your health care provider may recommend screening at an earlier age if you have risk factors for colon cancer.  Your health care provider may also recommend using home test kits to check for hidden blood in the stool.  A small camera at the end of a tube can be used to examine your colon directly (sigmoidoscopy or colonoscopy). This is done to check for the earliest forms of colorectal cancer.  Routine screening usually begins at age 32.  Direct examination of the colon should be repeated every 5-10 years through 72 years of age. However, you may need to be screened more often if early forms of precancerous polyps or small growths are found. Skin Cancer  Check your skin from head to toe regularly.  Tell your health care provider about any new  moles or changes in moles, especially if there is a change in a mole's shape or color.  Also tell your health care provider if you have a mole that is larger than the size of a pencil eraser.  Always use sunscreen. Apply sunscreen liberally and repeatedly throughout the day.  Protect yourself by wearing long sleeves, pants, a wide-brimmed hat, and sunglasses whenever you are outside. HEART DISEASE, DIABETES, AND HIGH BLOOD PRESSURE   High blood pressure causes heart disease and increases the risk of stroke. High blood pressure is more likely to develop in:  People who have blood pressure in the high end of the normal range (130-139/85-89 mm Hg).  People who are overweight or obese.  People who are African American.  If you are 52-21 years of age, have your blood pressure checked every 3-5 years. If you are 2 years of age or older, have your blood pressure checked every year. You should have your blood pressure measured twice--once when you are at a hospital or clinic, and once when you are not at a hospital or clinic. Record the average of the two measurements. To check your blood pressure when you are not at a hospital or clinic, you can use:  An automated blood pressure machine at a pharmacy.  A home blood pressure monitor.  If you are between 66 years and 42 years old, ask your health care provider if you should take aspirin to prevent strokes.  Have regular diabetes screenings. This involves taking a blood sample to check your fasting blood sugar level.  If you are at a normal weight and have a low risk for diabetes, have this test once every three years after 72 years of age.  If you are overweight and have a high risk for diabetes, consider being tested at a younger age or more often. PREVENTING INFECTION  Hepatitis B  If you have a higher risk for hepatitis B, you should be screened for this virus. You are considered at high risk for hepatitis B if:  You were born in a  country where hepatitis B is common. Ask your health care provider which countries are considered high risk.  Your parents were born in a high-risk country, and you have not been immunized against hepatitis B (hepatitis B vaccine).  You have HIV or AIDS.  You use needles to inject street drugs.  You live with someone who has hepatitis B.  You have  had sex with someone who has hepatitis B.  You get hemodialysis treatment.  You take certain medicines for conditions, including cancer, organ transplantation, and autoimmune conditions. Hepatitis C  Blood testing is recommended for:  Everyone born from [birth year] through 1967.  Anyone with known risk factors for hepatitis C. Sexually transmitted infections (STIs)  You should be screened for sexually transmitted infections (STIs) including gonorrhea and chlamydia if:  You are sexually active and are younger than 72 years of age.  You are older than 71 years of age and your health care provider tells you that you are at risk for this type of infection.  Your sexual activity has changed since you were last screened and you are at an increased risk for chlamydia or gonorrhea. Ask your health care provider if you are at risk.  If you do not have HIV, but are at risk, it may be recommended that you take a prescription medicine daily to prevent HIV infection. This is called pre-exposure prophylaxis (PrEP). You are considered at risk if:  You are sexually active and do not regularly use condoms or know the HIV status of your partner(s).  You take drugs by injection.  You are sexually active with a partner who has HIV. Talk with your health care provider about whether you are at high risk of being infected with HIV. If you choose to begin PrEP, you should first be tested for HIV. You should then be tested every 3 months for as long as you are taking PrEP.  PREGNANCY   If you are premenopausal and you may become pregnant, ask your health care  provider about preconception counseling.  If you may become pregnant, take 400 to 800 micrograms (mcg) of folic acid every day.  If you want to prevent pregnancy, talk to your health care provider about birth control (contraception). OSTEOPOROSIS AND MENOPAUSE   Osteoporosis is a disease in which the bones lose minerals and strength with aging. This can result in serious bone fractures. Your risk for osteoporosis can be identified using a bone density scan.  If you are 17 years of age or older, or if you are at risk for osteoporosis and fractures, ask your health care provider if you should be screened.  Ask your health care provider whether you should take a calcium or vitamin D supplement to lower your risk for osteoporosis.  Menopause may have certain physical symptoms and risks.  Hormone replacement therapy may reduce some of these symptoms and risks. Talk to your health care provider about whether hormone replacement therapy is right for you.  HOME CARE INSTRUCTIONS   Schedule regular health, dental, and eye exams.  Stay current with your immunizations.   Do not use any tobacco products including cigarettes, chewing tobacco, or electronic cigarettes.  If you are pregnant, do not drink alcohol.  If you are breastfeeding, limit how much and how often you drink alcohol.  Limit alcohol intake to no more than 1 drink per day for nonpregnant women. One drink equals 12 ounces of beer, 5 ounces of wine, or 1 ounces of hard liquor.  Do not use street drugs.  Do not share needles.  Ask your health care provider for help if you need support or information about quitting drugs.  Tell your health care provider if you often feel depressed.  Tell your health care provider if you have ever been abused or do not feel safe at home.   This information is not intended to replace  advice given to you by your health care provider. Make sure you discuss any questions you have with your health  care provider.   Document Released: 03/11/2011 Document Revised: 09/16/2014 Document Reviewed: 07/28/2013 Elsevier Interactive Patient Education 2016 [place] DASH stands for "Dietary Approaches to Stop Hypertension." The DASH eating plan is a healthy eating plan that has been shown to reduce high blood pressure (hypertension). Additional health benefits may include reducing the risk of type 2 diabetes mellitus, heart disease, and stroke. The DASH eating plan may also help with weight loss. WHAT DO I NEED TO KNOW ABOUT THE DASH EATING PLAN? For the DASH eating plan, you will follow these general guidelines:  Choose foods with a percent daily value for sodium of less than 5% (as listed on the food label).  Use salt-free seasonings or herbs instead of table salt or sea salt.  Check with your health care provider or pharmacist before using salt substitutes.  Eat lower-sodium products, often labeled as "lower sodium" or "no salt added."  Eat fresh foods.  Eat more vegetables, fruits, and low-fat dairy products.  Choose whole grains. Look for the word "whole" as the first word in the ingredient list.  Choose fish and skinless chicken or [place] more often than red meat. Limit fish, poultry, and meat to 6 oz (170 g) each day.  Limit sweets, desserts, sugars, and sugary drinks.  Choose heart-healthy fats.  Limit cheese to 1 oz (28 g) per day.  Eat more home-cooked food and less restaurant, buffet, and fast food.  Limit fried foods.  Cook foods using methods other than frying.  Limit canned vegetables. If you do use them, rinse them well to decrease the sodium.  When eating at a restaurant, ask that your food be prepared with less salt, or no salt if possible. WHAT FOODS CAN I EAT? Seek help from a dietitian for individual calorie needs. Grains Whole grain or whole wheat bread. Brown rice. Whole grain or whole wheat pasta. Quinoa, bulgur, and whole grain  cereals. Low-sodium cereals. Corn or whole wheat flour tortillas. Whole grain cornbread. Whole grain crackers. Low-sodium crackers. Vegetables Fresh or frozen vegetables (raw, steamed, roasted, or grilled). Low-sodium or reduced-sodium tomato and vegetable juices. Low-sodium or reduced-sodium tomato sauce and paste. Low-sodium or reduced-sodium canned vegetables.  Fruits All fresh, canned (in natural juice), or frozen fruits. Meat and Other Protein Products Ground beef (85% or leaner), grass-fed beef, or beef trimmed of fat. Skinless chicken or [place]. Ground chicken or [place]. Pork trimmed of fat. All fish and seafood. Eggs. Dried beans, peas, or lentils. Unsalted nuts and seeds. Unsalted canned beans. Dairy Low-fat dairy products, such as skim or 1% milk, 2% or reduced-fat cheeses, low-fat ricotta or cottage cheese, or plain low-fat yogurt. Low-sodium or reduced-sodium cheeses. Fats and Oils Tub margarines without trans fats. Light or reduced-fat mayonnaise and salad dressings (reduced sodium). Avocado. Safflower, olive, or canola oils. Natural peanut or almond butter. Other Unsalted popcorn and pretzels. The items listed above may not be a complete list of recommended foods or beverages. Contact your dietitian for more options. WHAT FOODS ARE NOT RECOMMENDED? Grains White bread. White pasta. White rice. Refined cornbread. Bagels and croissants. Crackers that contain trans fat. Vegetables Creamed or fried vegetables. Vegetables in a cheese sauce. Regular canned vegetables. Regular canned tomato sauce and paste. Regular tomato and vegetable juices. Fruits Dried fruits. Canned fruit in light or heavy syrup. Fruit juice. Meat and Other Protein Products Fatty cuts  of meat. Ribs, chicken wings, bacon, sausage, bologna, salami, chitterlings, fatback, hot dogs, bratwurst, and packaged luncheon meats. Salted nuts and seeds. Canned beans with salt. Dairy Whole or 2% milk, cream, half-and-half, and  cream cheese. Whole-fat or sweetened yogurt. Full-fat cheeses or blue cheese. Nondairy creamers and whipped toppings. Processed cheese, cheese spreads, or cheese curds. Condiments Onion and garlic salt, seasoned salt, table salt, and sea salt. Canned and packaged gravies. Worcestershire sauce. Tartar sauce. Barbecue sauce. Teriyaki sauce. Soy sauce, including reduced sodium. Steak sauce. Fish sauce. Oyster sauce. Cocktail sauce. Horseradish. Ketchup and mustard. Meat flavorings and tenderizers. Bouillon cubes. Hot sauce. Tabasco sauce. Marinades. Taco seasonings. Relishes. Fats and Oils Butter, stick margarine, lard, shortening, ghee, and bacon fat. Coconut, palm kernel, or palm oils. Regular salad dressings. Other Pickles and olives. Salted popcorn and pretzels. The items listed above may not be a complete list of foods and beverages to avoid. Contact your dietitian for more information. WHERE CAN I FIND MORE INFORMATION? National Heart, Lung, and Blood Institute: travelstabloid.com   This information is not intended to replace advice given to you by your health care provider. Make sure you discuss any questions you have with your health care provider.   Document Released: 08/15/2011 Document Revised: 09/16/2014 Document Reviewed: 06/30/2013 Elsevier Interactive Patient Education [employer].

## 2015-12-04 NOTE — Assessment & Plan Note (Signed)
Refer to audiology.

## 2015-12-05 ENCOUNTER — Encounter: Payer: Self-pay | Admitting: Family Medicine

## 2015-12-05 LAB — COMPREHENSIVE METABOLIC PANEL
ALK PHOS: 89 IU/L (ref 39–117)
ALT: 17 IU/L (ref 0–32)
AST: 22 IU/L (ref 0–40)
Albumin/Globulin Ratio: 2 (ref 1.2–2.2)
Albumin: 4.3 g/dL (ref 3.5–4.8)
BILIRUBIN TOTAL: 0.9 mg/dL (ref 0.0–1.2)
BUN/Creatinine Ratio: 14 (ref 11–26)
BUN: 11 mg/dL (ref 8–27)
CHLORIDE: 105 mmol/L (ref 96–106)
CO2: 28 mmol/L (ref 18–29)
Calcium: 9.8 mg/dL (ref 8.7–10.3)
Creatinine, Ser: 0.78 mg/dL (ref 0.57–1.00)
GFR calc Af Amer: 88 mL/min/{1.73_m2} (ref >59)
GFR calc non Af Amer: 77 mL/min/{1.73_m2} (ref >59)
GLUCOSE: 97 mg/dL (ref 65–99)
Globulin, Total: 2.2 g/dL (ref 1.5–4.5)
Potassium: 4.4 mmol/L (ref 3.5–5.2)
Sodium: 143 mmol/L (ref 134–144)
Total Protein: 6.5 g/dL (ref 6.0–8.5)

## 2015-12-05 LAB — LIPID PANEL W/O CHOL/HDL RATIO
CHOLESTEROL TOTAL: 171 mg/dL (ref 100–199)
HDL: 72 mg/dL (ref >39)
LDL CALC: 85 mg/dL (ref 0–99)
TRIGLYCERIDES: 69 mg/dL (ref 0–149)
VLDL CHOLESTEROL CAL: 14 mg/dL (ref 5–40)

## 2015-12-20 DIAGNOSIS — H903 Sensorineural hearing loss, bilateral: Secondary | ICD-10-CM | POA: Diagnosis not present

## 2015-12-31 ENCOUNTER — Other Ambulatory Visit: Payer: Self-pay | Admitting: Family Medicine

## 2015-12-31 NOTE — Telephone Encounter (Signed)
December 04, 2015 labs reviewed; Rx approved

## 2016-01-05 DIAGNOSIS — G4733 Obstructive sleep apnea (adult) (pediatric): Secondary | ICD-10-CM | POA: Diagnosis not present

## 2016-01-23 ENCOUNTER — Ambulatory Visit
Admission: RE | Admit: 2016-01-23 | Discharge: 2016-01-23 | Disposition: A | Discharge status: Home / Self Care | Payer: PPO | Source: Ambulatory Visit | Attending: Family Medicine | Admitting: Family Medicine

## 2016-01-23 DIAGNOSIS — M85852 Other specified disorders of bone density and structure, left thigh: Secondary | ICD-10-CM | POA: Insufficient documentation

## 2016-01-23 DIAGNOSIS — Z1231 Encounter for screening mammogram for malignant neoplasm of breast: Secondary | ICD-10-CM | POA: Diagnosis not present

## 2016-01-23 DIAGNOSIS — Z780 Asymptomatic menopausal state: Secondary | ICD-10-CM | POA: Diagnosis not present

## 2016-01-29 ENCOUNTER — Other Ambulatory Visit: Payer: Self-pay | Admitting: Family Medicine

## 2016-01-29 MED ORDER — OMEPRAZOLE 20 MG PO CPDR: 20.0000 mg | DELAYED_RELEASE_CAPSULE | Freq: Every day | ORAL | Status: DC | PRN

## 2016-01-29 NOTE — Telephone Encounter (Signed)
Pt needs refill on generic for prilosec. Pharm is [facility] in [place]

## 2016-01-29 NOTE — Telephone Encounter (Signed)
Pt willing to try 20mg 

## 2016-01-29 NOTE — Telephone Encounter (Signed)
Please see if patient would be willing to decrease this dose of omeprazole from 40 mg daily to 20 mg daily Use of this medicine long term can increase risk of osteoporosis, anemia, pneumonia, kidney disease, C diff colitis, etc. I'd like to help her decrease her use If she agrees, back to me to change dose Encourage her to limit/avoid triggers (chocolate, spicy foods, tomato-based foods, coffee, onions, etc.)

## 2016-01-29 NOTE — Telephone Encounter (Signed)
New Rx generated.

## 2016-01-30 ENCOUNTER — Encounter: Payer: Self-pay | Admitting: Family Medicine

## 2016-02-08 ENCOUNTER — Encounter: Payer: Self-pay | Admitting: Family Medicine

## 2016-02-08 ENCOUNTER — Ambulatory Visit (INDEPENDENT_AMBULATORY_CARE_PROVIDER_SITE_OTHER): Payer: PPO | Admitting: Family Medicine

## 2016-02-08 VITALS — BP 116/76 | HR 71 | Temp 98.4°F | Resp 18 | Ht 63.0 in | Wt 153.5 lb

## 2016-02-08 DIAGNOSIS — R87620 Atypical squamous cells of undetermined significance on cytologic smear of vagina (ASC-US): Secondary | ICD-10-CM

## 2016-02-08 DIAGNOSIS — H9192 Unspecified hearing loss, left ear: Secondary | ICD-10-CM

## 2016-02-08 DIAGNOSIS — R87811 Vaginal high risk human papillomavirus (HPV) DNA test positive: Secondary | ICD-10-CM

## 2016-02-08 DIAGNOSIS — M8588 Other specified disorders of bone density and structure, other site: Secondary | ICD-10-CM | POA: Diagnosis not present

## 2016-02-08 DIAGNOSIS — K219 Gastro-esophageal reflux disease without esophagitis: Secondary | ICD-10-CM | POA: Diagnosis not present

## 2016-02-08 DIAGNOSIS — Z01419 Encounter for gynecological examination (general) (routine) without abnormal findings: Secondary | ICD-10-CM | POA: Diagnosis not present

## 2016-02-08 DIAGNOSIS — M8580 Other specified disorders of bone density and structure, unspecified site: Secondary | ICD-10-CM | POA: Diagnosis not present

## 2016-02-08 DIAGNOSIS — I10 Essential (primary) hypertension: Secondary | ICD-10-CM

## 2016-02-08 NOTE — Patient Instructions (Signed)
I've put in a referral for you to see an endocrinologist Return next spring for your Medicare wellness visit

## 2016-02-08 NOTE — Assessment & Plan Note (Signed)
Using hearing aide now

## 2016-02-08 NOTE — Assessment & Plan Note (Signed)
Excellent control today; so glad she is exercising and working on weight loss

## 2016-02-08 NOTE — Assessment & Plan Note (Addendum)
Recheck pap, if Ascus then reflex HPV today

## 2016-02-08 NOTE — Assessment & Plan Note (Addendum)
Reviewed the dexa report; T-score femoral neck -2.4, so technically osteopenia, but her risk for hip fracture is increased; with her acid reflux, oral bisphosphonate not my first choice; will refer to endo to discuss possible Prolia; I don't think the miacalcin would be indicated, since her spine T-score was -0.1; fall precautions discussed; calcium intake diet is best, okay to supplement but may not be helpful explained

## 2016-02-08 NOTE — Progress Notes (Signed)
BP 116/76 mmHg  Pulse 71  Temp(Src) 98.4 F (36.9 C)  Resp 18  Ht '5\' 3"'$  (1.6 m)  Wt 153 lb 8 oz (69.627 kg)  BMI 27.20 kg/m2  SpO2 96%   Subjective:    Patient ID: Kathy Baldwin, female    DOB: 1946-03-27, 70 y.o.   MRN: [id number]  HPI: Kathy Baldwin is a 69 y.o. female  Chief Complaint  Patient presents with  . Gynecologic Exam    pt here for yearly pap exam   No issues with pelvic pain or discharge, no bleeding; here for repeat pap smear; hx of ASCUS with high risk HPV  We talked about her osteopenia, found on bone density test; reviewed scan results and letter She is taking a calcium supplement now, switched brand She does not drink milk; does eat yogurt and loves cheese; does like kale, has kale salad quite often; does eat some broccoli, but husband doesn't like it; does get some greens when eating out She has acid reflux disease Mother broke her femur  Elevated WBC and eval with hematologist all normal; no further work-up  Referred to audiologist and patient is now wearing hearing aides  Last mammogram reviewed from Jan 23, 2016; normal RECOMMENDATION: Screening mammogram in one year. (Code:[id number]) BI-RADS CATEGORY 1: Negative.  Depression screen PHQ 2/9 12/04/2015  Decreased Interest 0  Down, Depressed, Hopeless 0  PHQ - 2 Score 0   Relevant past medical, surgical, family and social history reviewed Past Medical History  Diagnosis Date  . Hypertension   . Hyperlipidemia   . ASCUS with positive high risk human papillomavirus of vagina     colpo done by Dr. Sabino Gasser 2010  . Osteopenia Oct. 2014  . Allergic rhinitis   . GERD (gastroesophageal reflux disease)   . Vitamin D deficiency disease   . OSA on CPAP   . Lymphocytosis   . Hypertrophy of nasal turbinates   . Menopause age 20  . Multiple food allergies     chocolate and cabbage  . Vertigo   . Sleep apnea   . GERD (gastroesophageal reflux disease)   . History of gallstones   .  Hearing loss    Past Surgical History  Procedure Laterality Date  . Carpal tunnel release Right 2015  . Cholecystectomy  15-20 years ago  . Breast cyst aspiration Right     negative  . Dilation and curettage of uterus  1982    s/p miscarriage  . Colonoscopy  04/25/08   Family History  Problem Relation Age of Onset  . Hypertension Mother   . Lung disease Father     black lung  . Heart disease Father   . Hyperlipidemia Father   . Hypertension Father   . Diabetes Sister   . Emphysema Sister     died of complications of emphyema  . Cancer Brother     unsure of type  . Asthma Daughter   . Deep vein thrombosis Sister   . Thyroid disease Sister   . Colon cancer Other 62    nephew   Social History  Substance Use Topics  . Smoking status: Never Smoker   . Smokeless tobacco: Never Used  . Alcohol Use: 0.0 oz/week    0 Standard drinks or equivalent per week     Comment: occasional   Interim medical history since last visit reviewed. Allergies and medications reviewed  Review of Systems Per HPI unless specifically indicated above She is losing weight  on purpose, going to the senior center, trying to eat better; taking tai chi classes     Objective:    BP 116/76 mmHg  Pulse 71  Temp(Src) 98.4 F (36.9 C)  Resp 18  Ht '5\' 3"'$  (1.6 m)  Wt 153 lb 8 oz (69.627 kg)  BMI 27.20 kg/m2  SpO2 96%  Wt Readings from Last 3 Encounters:  02/08/16 153 lb 8 oz (69.627 kg)  12/04/15 158 lb (71.668 kg)  07/18/15 161 lb (73.029 kg)    Physical Exam  Constitutional: She appears well-developed and well-nourished.  Cardiovascular: Normal rate and regular rhythm.   No murmur heard. Pulmonary/Chest: Effort normal and breath sounds normal. She has no decreased breath sounds. She has no wheezes. She has no rhonchi. She has no rales. Right breast exhibits no inverted nipple, no mass, no nipple discharge, no skin change and no tenderness. Left breast exhibits no inverted nipple, no mass, no  nipple discharge, no skin change and no tenderness. Breasts are symmetrical.  Genitourinary: Uterus normal. There is no rash or lesion on the right labia. There is no rash or lesion on the left labia. Uterus is not enlarged and not tender. Cervix exhibits no motion tenderness, no discharge and no friability. Right adnexum displays no mass, no tenderness and no fullness. Left adnexum displays no mass, no tenderness and no fullness. No erythema or bleeding in the vagina. No foreign body around the vagina. No vaginal discharge found.  Skin:  Numerous brown keratotic papules on the trunk, most following dermatomal lines  Psychiatric: Her mood appears not anxious. She does not exhibit a depressed mood.   Results for orders placed or performed in visit on 12/04/15  Lipid Panel w/o Chol/HDL Ratio  Result Value Ref Range   Cholesterol, Total 171 100 - 199 mg/dL   Triglycerides 69 0 - 149 mg/dL   HDL 72 >39 mg/dL   VLDL Cholesterol Cal 14 5 - 40 mg/dL   LDL Calculated 85 0 - 99 mg/dL  Comprehensive metabolic panel  Result Value Ref Range   Glucose 97 65 - 99 mg/dL   BUN 11 8 - 27 mg/dL   Creatinine, Ser 0.78 0.57 - 1.00 mg/dL   GFR calc non Af Amer 77 >59 mL/min/1.73   GFR calc Af Amer 88 >59 mL/min/1.73   BUN/Creatinine Ratio 14 11 - 26   Sodium 143 134 - 144 mmol/L   Potassium 4.4 3.5 - 5.2 mmol/L   Chloride 105 96 - 106 mmol/L   CO2 28 18 - 29 mmol/L   Calcium 9.8 8.7 - 10.3 mg/dL   Total Protein 6.5 6.0 - 8.5 g/dL   Albumin 4.3 3.5 - 4.8 g/dL   Globulin, Total 2.2 1.5 - 4.5 g/dL   Albumin/Globulin Ratio 2.0 1.2 - 2.2   Bilirubin Total 0.9 0.0 - 1.2 mg/dL   Alkaline Phosphatase 89 39 - 117 IU/L   AST 22 0 - 40 IU/L   ALT 17 0 - 32 IU/L      Assessment & Plan:   Problem List Items Addressed This Visit      Cardiovascular and Mediastinum   Hypertension    Excellent control today; so glad she is exercising and working on weight loss        Digestive   GERD (gastroesophageal  reflux disease)    Oral bisphosphonate might not be best option for her; refer to endo to discuss / consider Prolia        Musculoskeletal and  Integument   Osteopenia - Primary    Reviewed the dexa report; T-score femoral neck -2.4, so technically osteopenia, but her risk for hip fracture is increased; with her acid reflux, oral bisphosphonate not my first choice; will refer to endo to discuss possible Prolia; I don't think the miacalcin would be indicated, since her spine T-score was -0.1; fall precautions discussed; calcium intake diet is best, okay to supplement but may not be helpful explained      Relevant Orders   Ambulatory referral to Endocrinology     Other   ASCUS with positive high risk human papillomavirus of vagina    Recheck pap, if Ascus then reflex HPV today      Relevant Orders   IGP, rfx Aptima HPV ASCU   Hearing loss in left ear    Using hearing aide now       Other Visit Diagnoses    Encounter for gynecological examination        breast and pelvic/pap done       Follow up plan: No Follow-up on file.  An after-visit summary was printed and given to the patient at [facility].  Please see the patient instructions which may contain other information and recommendations beyond what is mentioned above in the assessment and plan.  Orders Placed This Encounter  Procedures  . Ambulatory referral to Endocrinology

## 2016-02-08 NOTE — Assessment & Plan Note (Signed)
Oral bisphosphonate might not be best option for her; refer to endo to discuss / consider Prolia

## 2016-02-10 LAB — IGP, RFX APTIMA HPV ASCU: PAP Smear Comment: 0

## 2016-02-23 ENCOUNTER — Telehealth: Payer: Self-pay | Admitting: Family Medicine

## 2016-02-23 NOTE — Telephone Encounter (Signed)
Pt would like a call back about a medical personal.

## 2016-02-23 NOTE — Telephone Encounter (Signed)
I spoke with her; not related to her health

## 2016-03-06 ENCOUNTER — Telehealth: Payer: Self-pay | Admitting: Family Medicine

## 2016-03-06 NOTE — Telephone Encounter (Signed)
Thank you :)

## 2016-03-06 NOTE — Telephone Encounter (Signed)
Patient wanted to inform you that she will be seeing Dr Katheran AweWhol-Gastro doctor on Monday.

## 2016-03-11 ENCOUNTER — Ambulatory Visit (INDEPENDENT_AMBULATORY_CARE_PROVIDER_SITE_OTHER): Payer: PPO | Admitting: Gastroenterology

## 2016-03-11 ENCOUNTER — Encounter: Payer: Self-pay | Admitting: Gastroenterology

## 2016-03-11 VITALS — BP 154/79 | HR 65 | Temp 99.3°F | Wt 157.0 lb

## 2016-03-11 DIAGNOSIS — R140 Abdominal distension (gaseous): Secondary | ICD-10-CM

## 2016-03-11 DIAGNOSIS — K5900 Constipation, unspecified: Secondary | ICD-10-CM | POA: Diagnosis not present

## 2016-03-11 DIAGNOSIS — E785 Hyperlipidemia, unspecified: Secondary | ICD-10-CM | POA: Insufficient documentation

## 2016-03-11 DIAGNOSIS — T7840XA Allergy, unspecified, initial encounter: Secondary | ICD-10-CM | POA: Insufficient documentation

## 2016-03-11 NOTE — Progress Notes (Signed)
Primary Care Physician: Baruch GoutyMelinda Lada, MD  Primary Gastroenterologist:  Dr. Midge Miniumarren Kwan [place]  Chief Complaint  Patient presents with  . Excessive gas  . Bloated    HPI: Kathy Baldwin is a 70 y.o. female here bloating with gas. The patient reports that she has excessive flatulence and bloating.  The patient states that she moves her bowels every other day.  She also reports that her stools are firm and sometimes hard.  There is no report of any rectal bleeding, black stools or bloody stools.  She does report that when she is  constipated her heartburn is worse. When she is not constipated her heartburn  Current Outpatient Prescriptions  Medication Sig Dispense Refill  . amLODipine (NORVASC) 5 MG tablet Take 1 tablet (5 mg total) by mouth daily. 90 tablet 1  . aspirin EC 81 MG tablet Take 81 mg by mouth daily.    Marland Kitchen. atorvastatin (LIPITOR) 10 MG tablet TAKE 1 TABLET(10 MG) BY MOUTH AT BEDTIME 90 tablet 1  . calcium carbonate (OSCAL) 1500 (600 CA) MG TABS tablet Take 600 mg of elemental calcium by mouth 2 (two) times daily with a meal.    . Cyanocobalamin (B-12) 2500 MCG TABS Take 1 tablet by mouth daily.    . fluticasone (FLONASE) 50 MCG/ACT nasal spray Place 2 sprays into both nostrils daily. 48 g 1  . omeprazole (PRILOSEC) 20 MG capsule Take 1 capsule (20 mg total) by mouth daily as needed. 90 capsule 0   No current facility-administered medications for this visit.    Allergies as of 03/11/2016 - Review Complete 03/11/2016  Allergen Reaction Noted  . Cabbage Other (See Comments) 06/05/2015  . Chocolate Other (See Comments) 06/05/2015  . Statins Other (See Comments) 05/25/2015    ROS:  General: Negative for anorexia, weight loss, fever, chills, fatigue, weakness. ENT: Negative for hoarseness, difficulty swallowing , nasal congestion. CV: Negative for chest pain, angina, palpitations, dyspnea on exertion, peripheral edema.  Respiratory: Negative for dyspnea at rest, dyspnea on  exertion, cough, sputum, wheezing.  GI: See history of present illness. GU:  Negative for dysuria, hematuria, urinary incontinence, urinary frequency, nocturnal urination.  Endo: Negative for unusual weight change.    Physical Examination:   BP 154/79 mmHg  Pulse 65  Temp(Src) 99.3 F (37.4 C) (Oral)  Wt 157 lb (71.215 kg)  General: Well-nourished, well-developed in no acute distress.  Eyes: No icterus. Conjunctivae pink. Mouth: Oropharyngeal mucosa moist and pink , no lesions erythema or exudate. Lungs: Clear to auscultation bilaterally. Non-labored. Heart: Regular rate and rhythm, no murmurs rubs or gallops.  Abdomen: Bowel sounds are normal, nontender, nondistended, no hepatosplenomegaly or masses, no abdominal bruits or hernia , no rebound or guarding.   Extremities: No lower extremity edema. No clubbing or deformities. Neuro: Alert and oriented x 3.  Grossly intact. Skin: Warm and dry, no jaundice.   Psych: Alert and cooperative, normal mood and affect.  Labs:    Imaging Studies: No results found.  Assessment and Plan:   Kathy Baldwin is a 70 y.o. y/o female who comes in today with bloating and gas with constipation.  The patient has been told to take Mi on a daily basis.  She has been told that if this does not work she should add fiber and prune juice as needed.  The patient has been explained the plan and agrees with it.   Note: This dictation was prepared with Dragon dictation along with smaller phrase technology. Any transcriptional  errors that result from this process are unintentional.

## 2016-03-13 DIAGNOSIS — Z780 Asymptomatic menopausal state: Secondary | ICD-10-CM | POA: Diagnosis not present

## 2016-03-13 DIAGNOSIS — M85852 Other specified disorders of bone density and structure, left thigh: Secondary | ICD-10-CM | POA: Diagnosis not present

## 2016-03-19 DIAGNOSIS — G4733 Obstructive sleep apnea (adult) (pediatric): Secondary | ICD-10-CM | POA: Diagnosis not present

## 2016-03-20 ENCOUNTER — Other Ambulatory Visit: Payer: Self-pay | Admitting: Family Medicine

## 2016-04-08 ENCOUNTER — Telehealth: Payer: Self-pay

## 2016-04-08 NOTE — Telephone Encounter (Signed)
Patient saw Dr.Wohl on 03/11/16. He put her on a program to follow to clear her bowels. She wants to know how long she should continue this. She also wants to know if she should be doing something else because it's not working or helping her.

## 2016-04-09 ENCOUNTER — Telehealth: Payer: Self-pay | Admitting: Family Medicine

## 2016-04-09 DIAGNOSIS — M810 Age-related osteoporosis without current pathological fracture: Secondary | ICD-10-CM | POA: Diagnosis not present

## 2016-04-09 HISTORY — DX: Age-related osteoporosis without current pathological fracture: M81.0

## 2016-04-09 NOTE — Telephone Encounter (Signed)
Left message on home number to return my call. Tried contacting pt on her cell and recording stated she was not accepting calls at the moment,

## 2016-04-09 NOTE — Telephone Encounter (Signed)
Pt returned my call and we discussed her gas issue. I asked pt if she was doing all of Dr. Annabell Sabal recommendations. Miralax daily, Fiber and prune juice. She stated she had not been doing the fiber. I have recommended her start the fiber and add a gas x to her regimen to see if this will help her symptoms. Pt will start this today and contact me on Friday to let me know if she has had any relief. Advised her Dr. Servando Snare will be out of the office starting Friday but will return on Tuesday. I will discuss with him if no improvement. Pt agreed and will let me know.

## 2016-04-12 ENCOUNTER — Telehealth: Payer: Self-pay | Admitting: Family Medicine

## 2016-04-29 NOTE — Telephone Encounter (Signed)
I tried to call patient, but her line would not accept a call from my number I'll check for the paperwork tomorrow

## 2016-04-30 ENCOUNTER — Other Ambulatory Visit: Payer: Self-pay | Admitting: Family Medicine

## 2016-04-30 NOTE — Telephone Encounter (Signed)
Sent with cautions 

## 2016-04-30 NOTE — Telephone Encounter (Signed)
Have this been taken care of? If so please close encounter °

## 2016-05-10 DIAGNOSIS — G4733 Obstructive sleep apnea (adult) (pediatric): Secondary | ICD-10-CM | POA: Diagnosis not present

## 2016-05-15 NOTE — Telephone Encounter (Signed)
I am sorry, but I am not seeing any paperwork and she won't take calls from my phone Can the paperwork be re-faxed? Thank you

## 2016-05-16 NOTE — Telephone Encounter (Signed)
Fax order to [facility] [phone]

## 2016-05-16 NOTE — Telephone Encounter (Signed)
Then fax [facility] to [facility]

## 2016-05-17 NOTE — Telephone Encounter (Signed)
faxed

## 2016-07-28 ENCOUNTER — Telehealth: Payer: Self-pay | Admitting: Family Medicine

## 2016-07-29 ENCOUNTER — Encounter: Payer: Self-pay | Admitting: Family Medicine

## 2016-07-29 MED ORDER — RANITIDINE HCL 300 MG PO TABS: 300.0000 mg | ORAL_TABLET | Freq: Every day | ORAL | 5 refills | Status: DC

## 2016-07-29 NOTE — Telephone Encounter (Signed)
I'm going to suggest patient wean off of this medicine, unless she has Barrett's esophagus (I have no record of that) This drug increases risks of certain health problems I'll suggest she decrease by one pill a week over the next six weeks until she stops it Take a pill six days, off one day the first week Take a pill five days, off two days the second week and so on Make the non-pill days non-consecutive as long as she can; for example Week 1: skip Monday Week 2: skip Monday and Friday Week 3: skip Monday, Wed, and Friday Week 4: skip Monday, Wed, Fri, Sat Etc. She can start taking Zantac 300 mg at bedtime if she needs something for heartburn; Rx sent; okay to take same days as PPI Avoid triggers Go to GI if still having symptoms off of PPI

## 2016-07-30 NOTE — Telephone Encounter (Signed)
Called pt regarding her approve Rx. Went over Dr. lada instructions on how to take the medication in order to ween her off omeprazole. Pt understood.

## 2016-08-07 ENCOUNTER — Telehealth: Payer: Self-pay | Admitting: Family Medicine

## 2016-08-07 NOTE — Telephone Encounter (Signed)
Uses cpap machine and [facility] has sent a form to you on yesterdayfor her to get supplies. Please give patient a call at [phone] if yo have any questions.

## 2016-08-31 ENCOUNTER — Ambulatory Visit
Admission: EM | Admit: 2016-08-31 | Discharge: 2016-08-31 | Disposition: A | Discharge status: Home / Self Care | Payer: PPO | Attending: Family Medicine | Admitting: Family Medicine

## 2016-08-31 ENCOUNTER — Ambulatory Visit (INDEPENDENT_AMBULATORY_CARE_PROVIDER_SITE_OTHER): Payer: PPO

## 2016-08-31 ENCOUNTER — Encounter: Payer: Self-pay | Admitting: Emergency Medicine

## 2016-08-31 DIAGNOSIS — R05 Cough: Secondary | ICD-10-CM | POA: Diagnosis not present

## 2016-08-31 DIAGNOSIS — J40 Bronchitis, not specified as acute or chronic: Secondary | ICD-10-CM

## 2016-08-31 DIAGNOSIS — R059 Cough, unspecified: Secondary | ICD-10-CM

## 2016-08-31 MED ORDER — BENZONATATE 100 MG PO CAPS: 100.0000 mg | ORAL_CAPSULE | Freq: Three times a day (TID) | ORAL | 0 refills | Status: DC | PRN

## 2016-08-31 MED ORDER — IPRATROPIUM-ALBUTEROL 0.5-2.5 (3) MG/3ML IN SOLN
3.0000 mL | Freq: Once | RESPIRATORY_TRACT | Status: AC
  Administered 2016-08-31: 3 mL via RESPIRATORY_TRACT

## 2016-08-31 MED ORDER — HYDROCOD POLST-CPM POLST ER 10-8 MG/5ML PO SUER: 5.0000 mL | Freq: Two times a day (BID) | ORAL | 0 refills | Status: DC | PRN

## 2016-08-31 MED ORDER — DOXYCYCLINE HYCLATE 100 MG PO TABS: 100.0000 mg | ORAL_TABLET | Freq: Two times a day (BID) | ORAL | 0 refills | Status: DC

## 2016-08-31 NOTE — ED Triage Notes (Signed)
Patient  c/o cough and chest congestion for over a week.   

## 2016-08-31 NOTE — ED Provider Notes (Addendum)
[facility]    CSN: [id number] Arrival date & time: 08/31/16  1415     History   Chief Complaint Chief Complaint  Patient presents with  . Cough    HPI Kathy Baldwin is a 70 y.o. female.   The history is provided by the patient.  URI  Presenting symptoms: congestion, cough, fatigue and fever   Severity:  Moderate Onset quality:  Sudden Duration:  10 days Timing:  Constant Progression:  Worsening Chronicity:  New Relieved by:  Nothing Ineffective treatments:  OTC medications Associated symptoms: wheezing (slight)   Associated symptoms: no headaches, no myalgias, no neck pain, no sinus pain and no swollen glands   Risk factors: being elderly and sick contacts   Risk factors: no chronic cardiac disease, no chronic kidney disease, no chronic respiratory disease, no diabetes mellitus, no immunosuppression, no recent illness and no recent travel     Past Medical History:  Diagnosis Date  . Allergic rhinitis   . 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 Holly Bodily 2010  . GERD (gastroesophageal reflux disease)   . Hearing loss   . History of gallstones   . Hyperlipidemia   . Hypertension   . Hypertrophy of nasal turbinates   . Lymphocytosis   . Menopause age 22  . Multiple food allergies    chocolate and cabbage  . OSA on CPAP   . Osteopenia Oct. 2014  . Sleep apnea   . Vertigo   . Vitamin D deficiency disease     Patient Active Problem List   Diagnosis Date Noted  . HLD (hyperlipidemia) 03/11/2016  . Allergic state 03/11/2016  . Hearing loss in left ear 12/04/2015  . Preventative health care 12/04/2015  . Need for hepatitis C screening test 08/16/2015  . Hypertension   . 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 GERD (gastroesophageal reflux disease)   . OSA on CPAP   . Hypertrophy of nasal turbinates   . Hyperlipidemia 03/28/2015  . Medication monitoring encounter 03/28/2015    . Allergic rhinitis 03/28/2015  . History of decompression of median nerve 02/14/2014  . Osteopenia 06/09/2013  . Synovitis and tenosynovitis 03/30/2013  . Foot pain 03/30/2013  . Obstructive apnea 10/27/2012  . Personal history of other specified conditions 08/13/2012  . Difficulty hearing 08/13/2012  . Groin pain 04/15/2012  . Metatarsalgia of right foot 08/13/2011    Past Surgical History:  Procedure Laterality Date  . BREAST CYST ASPIRATION Right    negative  . CARPAL TUNNEL RELEASE Right 2015  . CHOLECYSTECTOMY  15-20 years ago  . COLONOSCOPY  04/25/08  . DILATION AND CURETTAGE OF UTERUS  1982   s/p miscarriage    OB History    No data available       Home Medications    Prior to Admission medications   Medication Sig Start Date End Date Taking? Authorizing Provider  amLODipine (NORVASC) 5 MG tablet TAKE 1 TABLET(5 MG) BY MOUTH DAILY 03/20/16   Kerman Passey, MD  aspirin EC 81 MG tablet Take 81 mg by mouth daily.    Historical Provider, MD  atorvastatin (LIPITOR) 10 MG tablet TAKE 1 TABLET(10 MG) BY MOUTH AT BEDTIME 12/31/15   Kerman Passey, MD  benzonatate (TESSALON) 100 MG capsule Take 1 capsule (100 mg total) by mouth 3 (three) times daily as needed for cough. 08/31/16   Payton Mccallum, MD  calcium  carbonate (OSCAL) 1500 (600 CA) MG TABS tablet Take 600 mg of elemental calcium by mouth 2 (two) times daily with a meal.    Historical Provider, MD  chlorpheniramine-HYDROcodone (TUSSIONEX PENNKINETIC ER) 10-8 MG/5ML SUER Take 5 mLs by mouth every 12 (twelve) hours as needed. 08/31/16   Payton Mccallumrlando Laisha Rau, MD  Cyanocobalamin (B-12) 2500 MCG TABS Take 1 tablet by mouth daily.    Historical Provider, MD  doxycycline (VIBRA-TABS) 100 MG tablet Take 1 tablet (100 mg total) by mouth 2 (two) times daily. 08/31/16   Payton Mccallumrlando Chrisean Kloth, MD  fluticasone (FLONASE) 50 MCG/ACT nasal spray Place 2 sprays into both nostrils daily. 03/20/16   Kerman PasseyMelinda P Lada, MD  omeprazole (PRILOSEC) 20 MG capsule  Take one pill daily, but wean by skipping one pill each week over next six weeks 07/29/16 09/09/16  Kerman PasseyMelinda P Lada, MD  ranitidine (ZANTAC) 300 MG tablet Take 1 tablet (300 mg total) by mouth at bedtime. For heartburn, reflux 07/29/16   Kerman PasseyMelinda P Lada, MD    Family History Family History  Problem Relation Age of Onset  . Hypertension Mother   . Lung disease Father     black lung  . Heart disease Father   . Hyperlipidemia Father   . Hypertension Father   . Diabetes Sister   . Emphysema Sister     died of complications of emphyema  . Cancer Brother     unsure of type  . Asthma Daughter   . Deep vein thrombosis Sister   . Thyroid disease Sister   . Colon cancer Other 2849    nephew    Social History Social History  Substance Use Topics  . Smoking status: Never Smoker  . Smokeless tobacco: Never Used  . Alcohol use 0.0 oz/week     Comment: occasional     Allergies   Cabbage; Chocolate; and Statins   Review of Systems Review of Systems  Constitutional: Positive for fatigue and fever.  HENT: Positive for congestion. Negative for sinus pain.   Respiratory: Positive for cough and wheezing (slight).   Musculoskeletal: Negative for myalgias and neck pain.  Neurological: Negative for headaches.     Physical Exam Triage Vital Signs ED Triage Vitals  Enc Vitals Group     BP 08/31/16 1502 118/64     Pulse Rate 08/31/16 1502 82     Resp 08/31/16 1502 16     Temp 08/31/16 1502 99.1 F (37.3 C)     Temp Source 08/31/16 1502 Oral     SpO2 08/31/16 1502 95 %     Weight 08/31/16 1501 145 lb (65.8 kg)     Height 08/31/16 1501 5\' 3"  (1.6 m)     Head Circumference --      Peak Flow --      Pain Score 08/31/16 1502 4     Pain Loc --      Pain Edu? --      Excl. in GC? --    No data found.   Updated Vital Signs BP 118/64 (BP Location: Left Arm)   Pulse 73   Temp 99.1 F (37.3 C) (Oral)   Resp 16   Ht 5\' 3"  (1.6 m)   Wt 145 lb (65.8 kg)   SpO2 95%   BMI 25.69 kg/m     Visual Acuity Right Eye Distance:   Left Eye Distance:   Bilateral Distance:    Right Eye Near:   Left Eye Near:    Bilateral Near:  Physical Exam  Constitutional: She appears well-developed and well-nourished. No distress.  HENT:  Head: Normocephalic and atraumatic.  Right Ear: Tympanic membrane, external ear and ear canal normal.  Left Ear: Tympanic membrane, external ear and ear canal normal.  Nose: No mucosal edema, rhinorrhea, nose lacerations, sinus tenderness, nasal deformity, septal deviation or nasal septal hematoma. No epistaxis.  No foreign bodies. Right sinus exhibits no maxillary sinus tenderness and no frontal sinus tenderness. Left sinus exhibits no maxillary sinus tenderness and no frontal sinus tenderness.  Mouth/Throat: Uvula is midline, oropharynx is clear and moist and mucous membranes are normal. No oropharyngeal exudate.  Eyes: Conjunctivae and EOM are normal. Pupils are equal, round, and reactive to light. Right eye exhibits no discharge. Left eye exhibits no discharge. No scleral icterus.  Neck: Normal range of motion. Neck supple. No thyromegaly present.  Cardiovascular: Normal rate, regular rhythm and normal heart sounds.   Pulmonary/Chest: Effort normal. No respiratory distress. She has wheezes (mild, diffuse). She has rales (bilateral bases).  Lymphadenopathy:    She has no cervical adenopathy.  Skin: She is not diaphoretic.  Nursing note and vitals reviewed.    UC Treatments / Results  Labs (all labs ordered are listed, but only abnormal results are displayed) Labs Reviewed - No data to display  EKG  EKG Interpretation None       Radiology Dg Chest 2 View  Result Date: 08/31/2016 CLINICAL DATA:  Cough for 2 weeks. EXAM: CHEST  2 VIEW COMPARISON:  None. FINDINGS: Both lungs are clear. Heart and mediastinum are within normal limits. Atherosclerotic calcifications at the aortic arch. No pleural effusions. Surgical clips in the right upper  abdomen. Mild degenerative changes in the thoracic spine. IMPRESSION: No active cardiopulmonary disease. Electronically Signed   By: Richarda OverlieAdam  Henn M.D.   On: 08/31/2016 15:59    Procedures Procedures (including critical care time)  Medications Ordered in UC Medications  ipratropium-albuterol (DUONEB) 0.5-2.5 (3) MG/3ML nebulizer solution 3 mL (3 mLs Nebulization Given 08/31/16 1528)     Initial Impression / Assessment and Plan / UC Course  I have reviewed the triage vital signs and the nursing notes.  Pertinent labs & imaging results that were available during my care of the patient were reviewed by me and considered in my medical decision making (see chart for details).  Clinical Course       Final Clinical Impressions(s) / UC Diagnoses   Final diagnoses:  Cough  Bronchitis    New Prescriptions Discharge Medication List as of 08/31/2016  4:14 PM    START taking these medications   Details  benzonatate (TESSALON) 100 MG capsule Take 1 capsule (100 mg total) by mouth 3 (three) times daily as needed for cough., Starting Sat 08/31/2016, Normal    chlorpheniramine-HYDROcodone (TUSSIONEX PENNKINETIC ER) 10-8 MG/5ML SUER Take 5 mLs by mouth every 12 (twelve) hours as needed., Starting Sat 08/31/2016, Normal    doxycycline (VIBRA-TABS) 100 MG tablet Take 1 tablet (100 mg total) by mouth 2 (two) times daily., Starting Sat 08/31/2016, Normal       1. x-ray results and diagnosis reviewed with patient 2. Given duoneb x 1 with improvement of symptoms 3. rx as per orders above; reviewed possible side effects, interactions, risks and benefits  4. Recommend supportive treatment with rest, fluids 5. Follow-up prn if symptoms worsen or don't improve   Payton Mccallumrlando Arthelia Callicott, MD 08/31/16 1715    Payton Mccallumrlando Harvel Meskill, MD 08/31/16 [phone]

## 2016-09-03 ENCOUNTER — Encounter: Payer: Self-pay | Admitting: Family Medicine

## 2016-09-03 ENCOUNTER — Ambulatory Visit
Admission: RE | Admit: 2016-09-03 | Discharge: 2016-09-03 | Disposition: A | Discharge status: Home / Self Care | Payer: PPO | Source: Ambulatory Visit | Attending: Family Medicine | Admitting: Family Medicine

## 2016-09-03 ENCOUNTER — Ambulatory Visit (INDEPENDENT_AMBULATORY_CARE_PROVIDER_SITE_OTHER): Payer: PPO | Admitting: Family Medicine

## 2016-09-03 DIAGNOSIS — R05 Cough: Secondary | ICD-10-CM | POA: Diagnosis not present

## 2016-09-03 DIAGNOSIS — J180 Bronchopneumonia, unspecified organism: Secondary | ICD-10-CM | POA: Insufficient documentation

## 2016-09-03 DIAGNOSIS — Z9989 Dependence on other enabling machines and devices: Secondary | ICD-10-CM | POA: Diagnosis not present

## 2016-09-03 DIAGNOSIS — G4733 Obstructive sleep apnea (adult) (pediatric): Secondary | ICD-10-CM

## 2016-09-03 DIAGNOSIS — I700 Atherosclerosis of aorta: Secondary | ICD-10-CM | POA: Diagnosis not present

## 2016-09-03 MED ORDER — PREDNISONE 10 MG PO TABS: ORAL_TABLET | ORAL | 0 refills | Status: AC

## 2016-09-03 MED ORDER — LEVOFLOXACIN 500 MG PO TABS: 500.0000 mg | ORAL_TABLET | Freq: Every day | ORAL | 0 refills | Status: DC

## 2016-09-03 NOTE — Progress Notes (Signed)
BP 118/62 (BP Location: Left Arm, Patient Position: Sitting, Cuff Size: Normal)   Pulse 77   Temp 97.9 F (36.6 C) (Oral)   Resp 16   Wt 142 lb 8 oz (64.6 kg)   SpO2 93%   BMI 25.24 kg/m    Subjective:    Patient ID: Kathy Baldwin Justin, female    DOB: 06/25/1946, 70 y.o.   MRN: [id number]  HPI: Kathy Baldwin Koper is a 70 y.o. female  Chief Complaint  Patient presents with  . Cough    Lots of mucus and drainage. This morning yellow mucus but clear before    She was diagnosed with bronchitis; had chest xray; really bad drainage and mucous; was clear but now it's yellow They gave her med Got sick on December 10th; just feeling yucky; never had body aches; started with cough; nasal drainage; no headaches Just got back from cruise; got sick on the cruise No sore throuat No fever Chest and head congestion, center and upper chest Husband also got sick during the end of the cruise; he had hacking cough No personal hx of pneumonia  She saw Dr. Judd Gaudieronty, note reviewed; treatd with doxy, tussionex and tessalon perles; husband says they are not helping; could not get insurance coverage for tussionex Not much wheezing; no inhaler  Has OSA; uses CPAP; diagnosed Feb 2014; uses CPAP every night, even naps; wakes up more refreshed; without it, used to snore significantly; no known fam hx of OSA  [facility] 09/03/2016 12/04/2015  Decreased Interest 0 0  Down, Depressed, Hopeless 0 0  PHQ - 2 Score 0 0   Relevant past medical, surgical, family and social history reviewed Past Medical History:  Diagnosis Date  . Allergic rhinitis   . 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 Holly BodilyArtis 2010  . GERD (gastroesophageal reflux disease)   . Hearing loss   . History of gallstones   . Hyperlipidemia   . Hypertension   . Hypertrophy of nasal turbinates   . Lymphocytosis   . Menopause age 70  . Multiple food allergies    chocolate and  cabbage  . OSA on CPAP   . Osteopenia Oct. 2014  . Sleep apnea   . Vertigo   . Vitamin D deficiency disease    Past Surgical History:  Procedure Laterality Date  . BREAST CYST ASPIRATION Right    negative  . CARPAL TUNNEL RELEASE Right 2015  . CHOLECYSTECTOMY  15-20 years ago  . COLONOSCOPY  04/25/08  . DILATION AND CURETTAGE OF UTERUS  1982   s/Baldwin miscarriage   Family History  Problem Relation Age of Onset  . Hypertension Mother   . Lung disease Father     black lung  . Heart disease Father   . Hyperlipidemia Father   . Hypertension Father   . Diabetes Sister   . Emphysema Sister     died of complications of emphyema  . Cancer Brother     unsure of type  . Asthma Daughter   . Deep vein thrombosis Sister   . Thyroid disease Sister   . Colon cancer Other [id number]    nephew   Social History  Substance Use Topics  . Smoking status: Never Smoker  . Smokeless tobacco: Never Used  . Alcohol use 0.0 oz/week     Comment: occasional   Interim medical history since last visit reviewed. Allergies and medications reviewed  Review  of Systems Per HPI unless specifically indicated above     Objective:    BP 118/62 (BP Location: Left Arm, Patient Position: Sitting, Cuff Size: Normal)   Pulse 77   Temp 97.9 F (36.6 C) (Oral)   Resp 16   Wt 142 lb 8 oz (64.6 kg)   SpO2 93%   BMI 25.24 kg/m   Wt Readings from Last 3 Encounters:  09/03/16 142 lb 8 oz (64.6 kg)  08/31/16 145 lb (65.8 kg)  03/11/16 157 lb (71.2 kg)    Physical Exam  Constitutional: She appears well-developed and well-nourished.  HENT:  Head: Normocephalic and atraumatic.  Right Ear: Hearing, tympanic membrane, external ear and ear canal normal. Tympanic membrane is not erythematous. No middle ear effusion.  Left Ear: Hearing, external ear and ear canal normal. Tympanic membrane is not erythematous. A middle ear effusion is present.  Nose: Rhinorrhea present.  Mouth/Throat: Oropharynx is clear and moist and  mucous membranes are normal.  Eyes: EOM are normal. No scleral icterus.  Cardiovascular: Normal rate and regular rhythm.   Pulmonary/Chest: Effort normal. No accessory muscle usage. No respiratory distress. She has no decreased breath sounds. She has wheezes. She has rhonchi in the left middle field and the left lower field.  Lymphadenopathy:    She has no cervical adenopathy.  Psychiatric: She has a normal mood and affect. Her behavior is normal. Her mood appears not anxious. She does not exhibit a depressed mood.   Results for orders placed or performed in visit on 02/08/16  IGP, rfx Aptima HPV ASCU  Result Value Ref Range   DIAGNOSIS: Comment    Specimen adequacy: Comment    CLINICIAN PROVIDED ICD10: Comment    Performed by: Comment    PAP SMEAR COMMENT .    Note: Comment    Test Methodology Comment    PAP REFLEX: Comment       Assessment & Plan:   Problem List Items Addressed This Visit      Respiratory   OSA on CPAP    Using CPAP, recertify; benefit noted      Bronchopneumonia    Suspicious for pneumonia; switch gears, start levaquin; stop doxycycline; treat with low dose steroids; rest, hydration; on-call doctor available      Relevant Medications   levofloxacin (LEVAQUIN) 500 MG tablet   Other Relevant Orders   DG Chest 2 View      Follow up plan: No Follow-up on file.  An after-visit summary was printed and given to the patient at check-out.  Please see the patient instructions which may contain other information and recommendations beyond what is mentioned above in the assessment and plan.  Meds ordered this encounter  Medications  . levofloxacin (LEVAQUIN) 500 MG tablet    Sig: Take 1 tablet (500 mg total) by mouth daily.    Dispense:  7 tablet    Refill:  0  . predniSONE (DELTASONE) 10 MG tablet    Sig: Three pills by mouth daily x 2 days, then two pills daily x 2 days, then one pill daily x 2 days; take with food    Dispense:  12 tablet    Refill:  0      Orders Placed This Encounter  Procedures  . DG Chest 2 View

## 2016-09-03 NOTE — Assessment & Plan Note (Signed)
Using CPAP, recertify; benefit noted

## 2016-09-03 NOTE — Patient Instructions (Signed)
Stop the doxycycline Start the new antibiotic Please do eat yogurt daily or take a probiotic daily for the next month or two We want to replace the healthy germs in the gut If you notice foul, watery diarrhea in the next two months, schedule an appointment RIGHT AWAY Try vitamin C (orange juice if not diabetic or vitamin C tablets) and drink green tea to help your immune system during your illness Get plenty of rest and hydration

## 2016-09-03 NOTE — Assessment & Plan Note (Signed)
Suspicious for pneumonia; switch gears, start levaquin; stop doxycycline; treat with low dose steroids; rest, hydration; on-call doctor available

## 2016-09-04 ENCOUNTER — Telehealth: Payer: Self-pay | Admitting: Family Medicine

## 2016-09-04 NOTE — Telephone Encounter (Signed)
[facility] and was given antibiotic (doxycycline). She then came to see you and was given a different antibiotic (levosloxacin). Patient did the xray and was told that the xray does not show pneumonia. Patient would like to know if she should start back taking the doxycycline or what should she do. She had one dose of both medications on two different days.

## 2016-09-04 NOTE — Telephone Encounter (Signed)
Checking status on chest xray also need clarification on prednisone. Would like to know if she is taking it correctly. Please return call [phone]

## 2016-09-04 NOTE — Telephone Encounter (Signed)
The doxycycline has fewer potential side effects, so let's have her STOP levaquin and go back on the doxy Continue the steroid If not getting better, let me know

## 2016-09-04 NOTE — Telephone Encounter (Signed)
Poke with pt and gave clarification on dosing and explained that nothing was seen on her xray.

## 2016-09-05 ENCOUNTER — Other Ambulatory Visit: Payer: Self-pay | Admitting: Family Medicine

## 2016-09-05 ENCOUNTER — Encounter: Payer: Self-pay | Admitting: Family Medicine

## 2016-09-05 DIAGNOSIS — I700 Atherosclerosis of aorta: Secondary | ICD-10-CM

## 2016-09-05 DIAGNOSIS — Z5181 Encounter for therapeutic drug level monitoring: Secondary | ICD-10-CM

## 2016-09-05 NOTE — Assessment & Plan Note (Signed)
Monitor sgpt on statin 

## 2016-09-05 NOTE — Telephone Encounter (Signed)
Patient notified to start back with doxycycline and prednisone.

## 2016-09-05 NOTE — Progress Notes (Signed)
Orders for lipids, cmp; note to pt; check labs in a few weeks

## 2016-09-05 NOTE — Assessment & Plan Note (Signed)
Check lipids 

## 2016-09-14 ENCOUNTER — Other Ambulatory Visit: Payer: Self-pay | Admitting: Family Medicine

## 2016-09-14 NOTE — Telephone Encounter (Signed)
rx approved

## 2016-10-24 DIAGNOSIS — G4733 Obstructive sleep apnea (adult) (pediatric): Secondary | ICD-10-CM | POA: Diagnosis not present

## 2016-12-13 DIAGNOSIS — G4733 Obstructive sleep apnea (adult) (pediatric): Secondary | ICD-10-CM | POA: Diagnosis not present

## 2017-01-17 ENCOUNTER — Other Ambulatory Visit: Payer: Self-pay | Admitting: Family Medicine

## 2017-01-17 DIAGNOSIS — Z1231 Encounter for screening mammogram for malignant neoplasm of breast: Secondary | ICD-10-CM

## 2017-02-05 ENCOUNTER — Ambulatory Visit
Admission: RE | Admit: 2017-02-05 | Discharge: 2017-02-05 | Disposition: A | Discharge status: Home / Self Care | Payer: PPO | Source: Ambulatory Visit | Attending: Family Medicine | Admitting: Family Medicine

## 2017-02-05 DIAGNOSIS — Z1231 Encounter for screening mammogram for malignant neoplasm of breast: Secondary | ICD-10-CM | POA: Diagnosis not present

## 2017-02-10 ENCOUNTER — Encounter: Payer: PPO | Admitting: Family Medicine

## 2017-02-18 ENCOUNTER — Ambulatory Visit
Admission: RE | Admit: 2017-02-18 | Discharge: 2017-02-18 | Disposition: A | Discharge status: Home / Self Care | Payer: PPO | Source: Ambulatory Visit | Attending: Family Medicine | Admitting: Family Medicine

## 2017-02-18 ENCOUNTER — Encounter: Payer: Self-pay | Admitting: Family Medicine

## 2017-02-18 ENCOUNTER — Ambulatory Visit (INDEPENDENT_AMBULATORY_CARE_PROVIDER_SITE_OTHER): Payer: PPO | Admitting: Family Medicine

## 2017-02-18 VITALS — BP 122/62 | HR 66 | Temp 97.5°F | Resp 14 | Ht 62.5 in | Wt 125.3 lb

## 2017-02-18 DIAGNOSIS — I10 Essential (primary) hypertension: Secondary | ICD-10-CM | POA: Diagnosis not present

## 2017-02-18 DIAGNOSIS — R3129 Other microscopic hematuria: Secondary | ICD-10-CM

## 2017-02-18 DIAGNOSIS — R300 Dysuria: Secondary | ICD-10-CM

## 2017-02-18 DIAGNOSIS — N952 Postmenopausal atrophic vaginitis: Secondary | ICD-10-CM | POA: Diagnosis not present

## 2017-02-18 DIAGNOSIS — N159 Renal tubulo-interstitial disease, unspecified: Secondary | ICD-10-CM | POA: Diagnosis not present

## 2017-02-18 DIAGNOSIS — N3090 Cystitis, unspecified without hematuria: Secondary | ICD-10-CM | POA: Diagnosis not present

## 2017-02-18 DIAGNOSIS — R109 Unspecified abdominal pain: Secondary | ICD-10-CM | POA: Diagnosis not present

## 2017-02-18 LAB — POCT URINALYSIS DIPSTICK
BILIRUBIN UA: NEGATIVE
Glucose, UA: NEGATIVE
Ketones, UA: NEGATIVE
NITRITE UA: NEGATIVE
Protein, UA: NEGATIVE
Spec Grav, UA: 1.025 (ref 1.010–1.025)
Urobilinogen, UA: 0.2 E.U./dL
pH, UA: 6.5 (ref 5.0–8.0)

## 2017-02-18 MED ORDER — ESTROGENS, CONJUGATED 0.625 MG/GM VA CREA: TOPICAL_CREAM | VAGINAL | 12 refills | Status: DC

## 2017-02-18 MED ORDER — AMLODIPINE BESYLATE 2.5 MG PO TABS: 2.5000 mg | ORAL_TABLET | Freq: Every day | ORAL | 3 refills | Status: DC

## 2017-02-18 MED ORDER — ZOSTER VAC RECOMB ADJUVANTED 50 MCG/0.5ML IM SUSR: 0.5000 mL | Freq: Once | INTRAMUSCULAR | 1 refills | Status: AC

## 2017-02-18 MED ORDER — NITROFURANTOIN MONOHYD MACRO 100 MG PO CAPS: 100.0000 mg | ORAL_CAPSULE | Freq: Two times a day (BID) | ORAL | 0 refills | Status: AC

## 2017-02-18 NOTE — Patient Instructions (Addendum)
Start the antibiotics Return in 2-3 weeks and we'll recheck a urine quickly at that time Seek care or call if worsening pain, fever, nausea/vomiting Reduce the blood pressure medicine amlodipine from 5 mg daily to 2.5 mg daily Try to follow the DASH guidelines Go across the street and have the xray done Call with any problems

## 2017-02-18 NOTE — Progress Notes (Signed)
BP 122/62   Pulse 66   Temp 97.5 F (36.4 C) (Oral)   Resp 14   Ht 5' 2.5" (1.588 m)   Wt 125 lb 4.8 oz (56.8 kg)   SpO2 96%   BMI 22.55 kg/m    Subjective:    Patient ID: Kathy Baldwin, female    DOB: 10/26/1945, 71 y.o.   MRN: [id number]  HPI: Kathy Baldwin is a 71 y.o. female  Chief Complaint  Patient presents with  . Urinary Tract Infection    right flank pain and burning with urination    HPI Patient is here for an acute visit She will reschedule her complete physical / medicare visit Recent intercourse a few days before Two days duraction Little chilled last night Getting up every hour and a half last night to urinate Cloudy urine No strong smell Some burning with urination, more so yesterday Some right sided pain, little bit lower abd too, all on right side No hx of kidney stone No blood or pinkish tinge to the urine No hx of frequent UTIs, just varied over the years No surgical instrumentation of UT No other antibiotics in last 2 months  Hypertension Wonders if she still needs the 5 mg of amlodipine She does not check her own BP  Small case of shingles on right flank years ago; had old shingles vaccine years ago  more vaginal dryness; recent mammo negative; no hx of breast cancer  [facility] 02/18/2017 09/03/2016 12/04/2015  Decreased Interest 0 0 0  Down, Depressed, Hopeless 0 0 0  PHQ - 2 Score 0 0 0    Relevant past medical, surgical, family and social history reviewed Past Medical History:  Diagnosis Date  . Allergic rhinitis   . 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 Holly BodilyArtis 2010  . GERD (gastroesophageal reflux disease)   . Hearing loss   . History of gallstones   . Hyperlipidemia   . Hypertension   . Hypertrophy of nasal turbinates   . Lymphocytosis   . Menopause age 71  . Multiple food allergies    chocolate and cabbage  . OSA on CPAP   . Osteopenia Oct. 2014  .  Sleep apnea   . Vertigo   . Vitamin D deficiency disease    Past Surgical History:  Procedure Laterality Date  . BREAST CYST ASPIRATION Right    negative  . CARPAL TUNNEL RELEASE Right 2015  . CHOLECYSTECTOMY  15-20 years ago  . COLONOSCOPY  04/25/08  . DILATION AND CURETTAGE OF UTERUS  1982   s/p miscarriage   Family History  Problem Relation Age of Onset  . Hypertension Mother   . Lung disease Father        black lung  . Heart disease Father   . Hyperlipidemia Father   . Hypertension Father   . Diabetes Sister   . Emphysema Sister        died of complications of emphyema  . Cancer Brother        unsure of type  . Asthma Daughter   . Deep vein thrombosis Sister   . Thyroid disease Sister   . Colon cancer Other [id number]       nephew   Social History   Social History  . Marital status: Married    Spouse name: N/A  . Number of children: N/A  . Years of education: N/A   Occupational  History  . Not on file.   Social History Main Topics  . Smoking status: Never Smoker  . Smokeless tobacco: Never Used  . Alcohol use 0.0 oz/week     Comment: occasional  . Drug use: No  . Sexual activity: Yes    Partners: Male   Other Topics Concern  . Not on file   Social History Narrative  . No narrative on file    Interim medical history since last visit reviewed. Allergies and medications reviewed  Review of Systems Per HPI unless specifically indicated above     Objective:    BP 122/62   Pulse 66   Temp 97.5 F (36.4 C) (Oral)   Resp 14   Ht 5' 2.5" (1.588 m)   Wt 125 lb 4.8 oz (56.8 kg)   SpO2 96%   BMI 22.55 kg/m   Wt Readings from Last 3 Encounters:  02/18/17 125 lb 4.8 oz (56.8 kg)  09/03/16 142 lb 8 oz (64.6 kg)  08/31/16 145 lb (65.8 kg)    Physical Exam  Constitutional: She appears well-developed and well-nourished.  HENT:  Mouth/Throat: Mucous membranes are normal.  Eyes: EOM are normal. No scleral icterus.  Cardiovascular: Normal rate and regular  rhythm.   Pulmonary/Chest: Effort normal and breath sounds normal.  Abdominal: Soft. Normal appearance. There is tenderness in the suprapubic area. There is CVA tenderness (equivocal on the right). There is no rebound.  Skin: No rash (specifically, no rash on the right side where discomfort reported) noted.  Psychiatric: She has a normal mood and affect. Her behavior is normal. Her mood appears not anxious. She does not exhibit a depressed mood.    Results for orders placed or performed in visit on 02/18/17  POCT urinalysis dipstick  Result Value Ref Range   Color, UA yellow    Clarity, UA clear    Glucose, UA neg    Bilirubin, UA neg    Ketones, UA neg    Spec Grav, UA 1.025 1.010 - 1.025   Blood, UA 2+    pH, UA 6.5 5.0 - 8.0   Protein, UA neg    Urobilinogen, UA 0.2 0.2 or 1.0 E.U./dL   Nitrite, UA neg    Leukocytes, UA Large (3+) (A) Negative      Assessment & Plan:   Problem List Items Addressed This Visit      Cardiovascular and Mediastinum   Hypertension    Reduce dose of CCB; return in 2-3 weeks for medicare wellness and we'll see what BP is then      Relevant Medications   amLODipine (NORVASC) 2.5 MG tablet     Genitourinary   Vaginal atrophy    May have contributed to current UTI; will start small amount of topical estrogen; nightly for 2 weeks, then just 2x a week       Other Visit Diagnoses    Burning with urination    -  Primary   see urine results; start macrobid; culture pending; hydrate   Relevant Orders   POCT urinalysis dipstick (Completed)   Urine Culture   DG Abd 1 View   Other microscopic hematuria       recheck urine in 2-3 weeks; KUB ordered; urine culture pending   Relevant Orders   DG Abd 1 View   Acute right flank pain       KUB pending; discussed pyelo vs stone; reasons to seek medical care right away reviewed; start macrobid; culture pending; recheck urine  in 2-3 weeks   Relevant Orders   DG Abd 1 View       Follow up  plan: Return in about 2 weeks (around 03/04/2017) for Medicare Wellness check (2-3 weeks).  An after-visit summary was printed and given to the patient at check-out.  Please see the patient instructions which may contain other information and recommendations beyond what is mentioned above in the assessment and plan.  Meds ordered this encounter  Medications  . ranitidine (ZANTAC) 300 MG tablet    Sig: Take 300 mg by mouth as needed for heartburn.  . polyethylene glycol (MIRALAX / GLYCOLAX) packet    Sig: Take 17 g by mouth daily.  Marland Kitchen PEDIASURE/FIBER (PEDIASURE/FIBER) LIQD    Sig: Take 10 mLs by mouth.  Marland Kitchen amLODipine (NORVASC) 2.5 MG tablet    Sig: Take 1 tablet (2.5 mg total) by mouth daily.    Dispense:  90 tablet    Refill:  3    Actually decreasing the dose  . nitrofurantoin, macrocrystal-monohydrate, (MACROBID) 100 MG capsule    Sig: Take 1 capsule (100 mg total) by mouth 2 (two) times daily.    Dispense:  10 capsule    Refill:  0  . Zoster Vac Recomb Adjuvanted (SHINGRIX) injection    Sig: Inject 0.5 mLs into the muscle once. Repeat booster 2-6 months after first injection    Dispense:  0.5 mL    Refill:  1  . conjugated estrogens (PREMARIN) vaginal cream    Sig: Use a pea-sized amount externally and internally every night for 2 weeks, then just twice a week    Dispense:  42.5 g    Refill:  12    Orders Placed This Encounter  Procedures  . Urine Culture  . DG Abd 1 View  . POCT urinalysis dipstick

## 2017-02-18 NOTE — Assessment & Plan Note (Signed)
Reduce dose of CCB; return in 2-3 weeks for medicare wellness and we'll see what BP is then

## 2017-02-18 NOTE — Assessment & Plan Note (Signed)
May have contributed to current UTI; will start small amount of topical estrogen; nightly for 2 weeks, then just 2x a week

## 2017-02-20 LAB — URINE CULTURE

## 2017-03-07 DIAGNOSIS — Z780 Asymptomatic menopausal state: Secondary | ICD-10-CM | POA: Diagnosis not present

## 2017-03-07 DIAGNOSIS — M85852 Other specified disorders of bone density and structure, left thigh: Secondary | ICD-10-CM | POA: Diagnosis not present

## 2017-03-14 DIAGNOSIS — Z780 Asymptomatic menopausal state: Secondary | ICD-10-CM | POA: Diagnosis not present

## 2017-03-14 DIAGNOSIS — M8589 Other specified disorders of bone density and structure, multiple sites: Secondary | ICD-10-CM | POA: Diagnosis not present

## 2017-03-20 ENCOUNTER — Encounter: Payer: Self-pay | Admitting: Family Medicine

## 2017-03-20 ENCOUNTER — Ambulatory Visit (INDEPENDENT_AMBULATORY_CARE_PROVIDER_SITE_OTHER): Payer: PPO | Admitting: Family Medicine

## 2017-03-20 VITALS — BP 112/72 | HR 60 | Temp 97.8°F | Resp 14 | Wt 125.3 lb

## 2017-03-20 DIAGNOSIS — Z7189 Other specified counseling: Secondary | ICD-10-CM

## 2017-03-20 DIAGNOSIS — Z0000 Encounter for general adult medical examination without abnormal findings: Secondary | ICD-10-CM

## 2017-03-20 DIAGNOSIS — Z7185 Encounter for immunization safety counseling: Secondary | ICD-10-CM

## 2017-03-20 DIAGNOSIS — Z66 Do not resuscitate: Secondary | ICD-10-CM | POA: Insufficient documentation

## 2017-03-20 HISTORY — DX: Do not resuscitate: Z66

## 2017-03-20 MED ORDER — ZOSTER VAC RECOMB ADJUVANTED 50 MCG/0.5ML IM SUSR: 0.5000 mL | Freq: Once | INTRAMUSCULAR | 1 refills | Status: AC

## 2017-03-20 NOTE — Assessment & Plan Note (Signed)
USPSTF grade A and B recommendations reviewed with patient; age-appropriate recommendations, preventive care, screening tests, etc discussed and encouraged; healthy living encouraged; see AVS for patient education given to patient  

## 2017-03-20 NOTE — Patient Instructions (Addendum)
Health Maintenance  Topic Date Due  . INFLUENZA VACCINE  04/09/2017  . MAMMOGRAM  02/05/2018  . COLONOSCOPY  04/25/2018  . TETANUS/TDAP  09/09/2021  . DEXA SCAN  Completed  . Hepatitis C Screening  Completed  . PNA vac Low Risk Adult  Completed   DEXA scan will be determined by Dr. [place] can get the shingles vaccine at your participating pharmacy   Health Maintenance for Postmenopausal Women Menopause is a normal process in which your reproductive ability comes to an end. This process happens gradually over a span of months to years, usually between the ages of 18 and 11. Menopause is complete when you have missed 12 consecutive menstrual periods. It is important to talk with your health care provider about some of the most common conditions that affect postmenopausal women, such as heart disease, cancer, and bone loss (osteoporosis). Adopting a healthy lifestyle and getting preventive care can help to promote your health and wellness. Those actions can also lower your chances of developing some of these common conditions. What should I know about menopause? During menopause, you may experience a number of symptoms, such as:  Moderate-to-severe hot flashes.  Night sweats.  Decrease in sex drive.  Mood swings.  Headaches.  Tiredness.  Irritability.  Memory problems.  Insomnia.  Choosing to treat or not to treat menopausal changes is an individual decision that you make with your health care provider. What should I know about hormone replacement therapy and supplements? Hormone therapy products are effective for treating symptoms that are associated with menopause, such as hot flashes and night sweats. Hormone replacement carries certain risks, especially as you become older. If you are thinking about using estrogen or estrogen with progestin treatments, discuss the benefits and risks with your health care provider. What should I know about heart disease and  stroke? Heart disease, heart attack, and stroke become more likely as you age. This may be due, in part, to the hormonal changes that your body experiences during menopause. These can affect how your body processes dietary fats, triglycerides, and cholesterol. Heart attack and stroke are both medical emergencies. There are many things that you can do to help prevent heart disease and stroke:  Have your blood pressure checked at least every 1-2 years. High blood pressure causes heart disease and increases the risk of stroke.  If you are 57-58 years old, ask your health care provider if you should take aspirin to prevent a heart attack or a stroke.  Do not use any tobacco products, including cigarettes, chewing tobacco, or electronic cigarettes. If you need help quitting, ask your health care provider.  It is important to eat a healthy diet and maintain a healthy weight. ? Be sure to include plenty of vegetables, fruits, low-fat dairy products, and lean protein. ? Avoid eating foods that are high in solid fats, added sugars, or salt (sodium).  Get regular exercise. This is one of the most important things that you can do for your health. ? Try to exercise for at least 150 minutes each week. The type of exercise that you do should increase your heart rate and make you sweat. This is known as moderate-intensity exercise. ? Try to do strengthening exercises at least twice each week. Do these in addition to the moderate-intensity exercise.  Know your numbers.Ask your health care provider to check your cholesterol and your blood glucose. Continue to have your blood tested as directed by your health care provider.  What should I  know about cancer screening? There are several types of cancer. Take the following steps to reduce your risk and to catch any cancer development as early as possible. Breast Cancer  Practice breast self-awareness. ? This means understanding how your breasts normally appear  and feel. ? It also means doing regular breast self-exams. Let your health care provider know about any changes, no matter how small.  If you are 53 or older, have a clinician do a breast exam (clinical breast exam or CBE) every year. Depending on your age, family history, and medical history, it may be recommended that you also have a yearly breast X-ray (mammogram).  If you have a family history of breast cancer, talk with your health care provider about genetic screening.  If you are at high risk for breast cancer, talk with your health care provider about having an MRI and a mammogram every year.  Breast cancer (BRCA) gene test is recommended for women who have family members with BRCA-related cancers. Results of the assessment will determine the need for genetic counseling and BRCA1 and for BRCA2 testing. BRCA-related cancers include these types: ? Breast. This occurs in males or females. ? Ovarian. ? Tubal. This may also be called fallopian tube cancer. ? Cancer of the abdominal or pelvic lining (peritoneal cancer). ? Prostate. ? Pancreatic.  Cervical, Uterine, and Ovarian Cancer Your health care provider may recommend that you be screened regularly for cancer of the pelvic organs. These include your ovaries, uterus, and vagina. This screening involves a pelvic exam, which includes checking for microscopic changes to the surface of your cervix (Pap test).  For women ages 21-65, health care providers may recommend a pelvic exam and a Pap test every three years. For women ages 7-65, they may recommend the Pap test and pelvic exam, combined with testing for human papilloma virus (HPV), every five years. Some types of HPV increase your risk of cervical cancer. Testing for HPV may also be done on women of any age who have unclear Pap test results.  Other health care providers may not recommend any screening for nonpregnant women who are considered low risk for pelvic cancer and have no  symptoms. Ask your health care provider if a screening pelvic exam is right for you.  If you have had past treatment for cervical cancer or a condition that could lead to cancer, you need Pap tests and screening for cancer for at least 20 years after your treatment. If Pap tests have been discontinued for you, your risk factors (such as having a new sexual partner) need to be reassessed to determine if you should start having screenings again. Some women have medical problems that increase the chance of getting cervical cancer. In these cases, your health care provider may recommend that you have screening and Pap tests more often.  If you have a family history of uterine cancer or ovarian cancer, talk with your health care provider about genetic screening.  If you have vaginal bleeding after reaching menopause, tell your health care provider.  There are currently no reliable tests available to screen for ovarian cancer.  Lung Cancer Lung cancer screening is recommended for adults 53-71 years old who are at high risk for lung cancer because of a history of smoking. A yearly low-dose CT scan of the lungs is recommended if you:  Currently smoke.  Have a history of at least 30 pack-years of smoking and you currently smoke or have quit within the past 15 years. A  pack-year is smoking an average of one pack of cigarettes per day for one year.  Yearly screening should:  Continue until it has been 15 years since you quit.  Stop if you develop a health problem that would prevent you from having lung cancer treatment.  Colorectal Cancer  This type of cancer can be detected and can often be prevented.  Routine colorectal cancer screening usually begins at age 57 and continues through age 57.  If you have risk factors for colon cancer, your health care provider may recommend that you be screened at an earlier age.  If you have a family history of colorectal cancer, talk with your health care  provider about genetic screening.  Your health care provider may also recommend using home test kits to check for hidden blood in your stool.  A small camera at the end of a tube can be used to examine your colon directly (sigmoidoscopy or colonoscopy). This is done to check for the earliest forms of colorectal cancer.  Direct examination of the colon should be repeated every 5-10 years until age 24. However, if early forms of precancerous polyps or small growths are found or if you have a family history or genetic risk for colorectal cancer, you may need to be screened more often.  Skin Cancer  Check your skin from head to toe regularly.  Monitor any moles. Be sure to tell your health care provider: ? About any new moles or changes in moles, especially if there is a change in a mole's shape or color. ? If you have a mole that is larger than the size of a pencil eraser.  If any of your family members has a history of skin cancer, especially at a young age, talk with your health care provider about genetic screening.  Always use sunscreen. Apply sunscreen liberally and repeatedly throughout the day.  Whenever you are outside, protect yourself by wearing long sleeves, pants, a wide-brimmed hat, and sunglasses.  What should I know about osteoporosis? Osteoporosis is a condition in which bone destruction happens more quickly than new bone creation. After menopause, you may be at an increased risk for osteoporosis. To help prevent osteoporosis or the bone fractures that can happen because of osteoporosis, the following is recommended:  If you are 43-13 years old, get at least 1,000 mg of calcium and at least 600 mg of vitamin D per day.  If you are older than age 54 but younger than age 32, get at least 1,200 mg of calcium and at least 600 mg of vitamin D per day.  If you are older than age 84, get at least 1,200 mg of calcium and at least 800 mg of vitamin D per day.  Smoking and excessive  alcohol intake increase the risk of osteoporosis. Eat foods that are rich in calcium and vitamin D, and do weight-bearing exercises several times each week as directed by your health care provider. What should I know about how menopause affects my mental health? Depression may occur at any age, but it is more common as you become older. Common symptoms of depression include:  Low or sad mood.  Changes in sleep patterns.  Changes in appetite or eating patterns.  Feeling an overall lack of motivation or enjoyment of activities that you previously enjoyed.  Frequent crying spells.  Talk with your health care provider if you think that you are experiencing depression. What should I know about immunizations? It is important that you get  and maintain your immunizations. These include:  Tetanus, diphtheria, and pertussis (Tdap) booster vaccine.  Influenza every year before the flu season begins.  Pneumonia vaccine.  Shingles vaccine.  Your health care provider may also recommend other immunizations. This information is not intended to replace advice given to you by your health care provider. Make sure you discuss any questions you have with your health care provider. Document Released: 10/18/2005 Document Revised: 03/15/2016 Document Reviewed: 05/30/2015 Elsevier Interactive Patient Education  2018 [employer].

## 2017-03-20 NOTE — Progress Notes (Signed)
Patient: Kathy Baldwin, Female    DOB: 06/13/1946, 71 y.o.   MRN: [id number]  Visit Date: 03/20/2017  Today's Provider: Baruch GoutyMelinda Haruki Arnold, MD   Chief Complaint  Patient presents with  . Medicare Wellness    Subjective:   Kathy MallingJanice P Baldwin is a 71 y.o. female who presents today for her Subsequent Annual Wellness Visit.  Caregiver input:  N/a  USPSTF grade A and B recommendations Depression:  Depression screen [facility] 2/9 03/20/2017 02/18/2017 09/03/2016 12/04/2015  Decreased Interest 0 0 0 0  Down, Depressed, Hopeless 0 0 0 0  PHQ - 2 Score 0 0 0 0   Hypertension: BP Readings from Last 3 Encounters:  03/20/17 112/72  02/18/17 122/62  09/03/16 118/62   Obesity: stable, goal is 125-130 pounds; weight loss makes sense for intake and activity Wt Readings from Last 3 Encounters:  03/20/17 125 lb 4.8 oz (56.8 kg)  02/18/17 125 lb 4.8 oz (56.8 kg)  09/03/16 142 lb 8 oz (64.6 kg)   BMI Readings from Last 3 Encounters:  03/20/17 22.55 kg/m  02/18/17 22.55 kg/m  09/03/16 25.24 kg/m    Alcohol: occasionally, socially Tobacco use: never HIV, hep B, hep C: UTD STD testing and prevention (chl/gon/syphilis): declined Intimate partner violence: no abuse Breast cancer: May 2018 Cervical cancer screening: seeing GYN Osteoporosis: managed by endo Fall prevention/vitamin D: normal on March 07, 2017; good fall prevention Lipids:  Lab Results  Component Value Date   CHOL 171 12/04/2015   CHOL 187 05/02/2015   Lab Results  Component Value Date   HDL 72 12/04/2015   HDL 72 05/02/2015   Lab Results  Component Value Date   LDLCALC 85 12/04/2015   LDLCALC 95 05/02/2015   Lab Results  Component Value Date   TRIG 69 12/04/2015   TRIG 101 05/02/2015   No results found for: CHOLHDL No results found for: LDLDIRECT Glucose:  Glucose  Date Value Ref Range Status  12/04/2015 97 65 - 99 mg/dL Final  40/98/119108/23/2016 95 65 - 99 mg/dL Final   Colorectal cancer: 2009, due next year; no  fam hx of colon cancer in 1st degree relative Lung cancer:  never AAA: n/a Aspirin: taking 81 mg daily Diet: eating much better; losing weight on purpose Exercise: pretty active Skin cancer: mole on the back; checked before, okay  Immunizations: discussed, needs new shingles vaccines to go elsewhere  HPI  Review of Systems  Past Medical History:  Diagnosis Date  . Allergic rhinitis   . 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 Holly BodilyArtis 2010  . DNR no code (do not resuscitate) 03/20/2017  . GERD (gastroesophageal reflux disease)   . Hearing loss   . History of gallstones   . Hyperlipidemia   . Hypertension   . Hypertrophy of nasal turbinates   . Lymphocytosis   . Menopause age 89  . Multiple food allergies    chocolate and cabbage  . OSA on CPAP   . Osteopenia Oct. 2014  . Sleep apnea   . Vertigo   . Vitamin D deficiency disease     Past Surgical History:  Procedure Laterality Date  . BREAST CYST ASPIRATION Right    negative  . CARPAL TUNNEL RELEASE Right 2015  . CHOLECYSTECTOMY  15-20 years ago  . COLONOSCOPY  04/25/08  . DILATION AND CURETTAGE OF UTERUS  1982   s/p miscarriage    Family History  Problem Relation Age of Onset  .  Lung disease Father        black lung  . Heart disease Father   . Hyperlipidemia Father   . Hypertension Father   . Emphysema Sister   . Diabetes Sister   . Emphysema Sister        died of complications of emphyema  . Cancer Brother        unsure of type  . Asthma Daughter   . Deep vein thrombosis Sister   . Thyroid disease Sister   . Stroke Maternal Grandmother   . Hypertension Maternal Grandmother   . Diabetes Maternal Grandmother     Social History   Social History  . Marital status: Married    Spouse name: N/A  . Number of children: N/A  . Years of education: N/A   Occupational History  . Not on file.   Social History Main Topics  . Smoking status: Never Smoker  .  Smokeless tobacco: Never Used  . Alcohol use 0.0 oz/week     Comment: occasional-holidays  . Drug use: No  . Sexual activity: Yes    Partners: Male   Other Topics Concern  . Not on file   Social History Narrative  . No narrative on file    Outpatient Encounter Prescriptions as of 03/20/2017  Medication Sig  . amLODipine (NORVASC) 2.5 MG tablet Take 1 tablet (2.5 mg total) by mouth daily.  Marland Kitchen aspirin EC 81 MG tablet Take 81 mg by mouth daily.  . calcium carbonate (OSCAL) 1500 (600 CA) MG TABS tablet Take 600 mg of elemental calcium by mouth 2 (two) times daily with a meal.  . conjugated estrogens (PREMARIN) vaginal cream Use a pea-sized amount externally and internally every night for 2 weeks, then just twice a week  . Cyanocobalamin (B-12) 2500 MCG TABS Take 1 tablet by mouth daily.  . fluticasone (FLONASE) 50 MCG/ACT nasal spray Place 2 sprays into both nostrils daily.  Marland Kitchen PEDIASURE/FIBER (PEDIASURE/FIBER) LIQD Take 10 mLs by mouth.  . polyethylene glycol (MIRALAX / GLYCOLAX) packet Take 17 g by mouth daily.  . ranitidine (ZANTAC) 300 MG tablet Take 300 mg by mouth as needed for heartburn.  . Zoster Vac Recomb Adjuvanted [facility]) injection Inject 0.5 mLs into the muscle once. Receive booster vaccine 2-6 months after the 1st shot   No facility-administered encounter medications on file as of 03/20/2017.     Functional Ability / Safety Screening 1.  Was the timed Get Up and Go test longer than 30 seconds?  no 2.  Does the patient need help with the phone, transportation, shopping,      preparing meals, housework, laundry, medications, or managing money?  no 3.  Does the patient's home have:  loose throw rugs in the hallway?   no      Grab bars in the bathroom? no      Handrails on the stairs?   yes      Poor lighting?   no 4.  Has the patient noticed any hearing difficulties?   yes, hearing aides bilaterally  Fall Risk Assessment See under rooming  Depression Screen See under  rooming Depression screen [facility] 2/9 03/20/2017 02/18/2017 09/03/2016 12/04/2015  Decreased Interest 0 0 0 0  Down, Depressed, Hopeless 0 0 0 0  PHQ - 2 Score 0 0 0 0    Advanced Directives Does patient have a HCPOA?    yes If yes, name and contact information: husband, [phone] Does patient have a living will or MOST form?  yes    Objective:   Vitals: BP 112/72   Pulse 60   Temp 97.8 F (36.6 C) (Oral)   Resp 14   Wt 125 lb 4.8 oz (56.8 kg)   SpO2 96%   BMI 22.55 kg/m  Body mass index is 22.55 kg/m. No exam data present  Physical Exam  Constitutional: She appears well-developed and well-nourished. No distress.  Eyes: EOM are normal.  Cardiovascular: Normal rate.   Pulmonary/Chest: Effort normal.  Skin: No pallor.  Keratotic lesion on the LEFT side of back above bra line, typical SK  Psychiatric: She has a normal mood and affect.   Mood/affect:  euthymic Appearance:  Neatly dressed  6CIT Screen 03/20/2017  What Year? 0 points  What month? 0 points  What time? 0 points  Count back from 20 0 points  Months in reverse 0 points  Repeat phrase 0 points  Total Score 0    Assessment & Plan:     Annual Wellness Visit  Reviewed patient's Family Medical History Reviewed and updated list of patient's medical providers Assessment of cognitive impairment was done Assessed patient's functional ability Established a written schedule for health screening services Health Risk Assessent Completed and Reviewed  Exercise Activities and Dietary recommendations Goals    None    stay where she is right now  Immunization History  Administered Date(s) Administered  . Influenza, Seasonal, Injecte, Preservative Fre 05/25/2015  . Influenza-Unspecified 05/10/2014, 06/22/2016  . Pneumococcal Conjugate-13 11/30/2014  . Pneumococcal Polysaccharide-23 08/13/2011, 10/01/2013  . Tdap 09/10/2011  . Zoster 08/13/2012    Health Maintenance  Topic Date Due  . INFLUENZA VACCINE   04/09/2017  . MAMMOGRAM  02/05/2018  . COLONOSCOPY  04/25/2018  . TETANUS/TDAP  09/09/2021  . DEXA SCAN  Completed  . Hepatitis C Screening  Completed  . PNA vac Low Risk Adult  Completed    Discussed health benefits of physical activity, and encouraged her to engage in regular exercise appropriate for her age and condition.   Meds ordered this encounter  Medications  . Zoster Vac Recomb Adjuvanted [facility]) injection    Sig: Inject 0.5 mLs into the muscle once. Receive booster vaccine 2-6 months after the 1st shot    Dispense:  0.5 mL    Refill:  1    Current Outpatient Prescriptions:  .  amLODipine (NORVASC) 2.5 MG tablet, Take 1 tablet (2.5 mg total) by mouth daily., Disp: 90 tablet, Rfl: 3 .  aspirin EC 81 MG tablet, Take 81 mg by mouth daily., Disp: , Rfl:  .  calcium carbonate (OSCAL) 1500 (600 CA) MG TABS tablet, Take 600 mg of elemental calcium by mouth 2 (two) times daily with a meal., Disp: , Rfl:  .  conjugated estrogens (PREMARIN) vaginal cream, Use a pea-sized amount externally and internally every night for 2 weeks, then just twice a week, Disp: 42.5 g, Rfl: 12 .  Cyanocobalamin (B-12) 2500 MCG TABS, Take 1 tablet by mouth daily., Disp: , Rfl:  .  fluticasone (FLONASE) 50 MCG/ACT nasal spray, Place 2 sprays into both nostrils daily., Disp: 48 g, Rfl: 3 .  PEDIASURE/FIBER (PEDIASURE/FIBER) LIQD, Take 10 mLs by mouth., Disp: , Rfl:  .  polyethylene glycol (MIRALAX / GLYCOLAX) packet, Take 17 g by mouth daily., Disp: , Rfl:  .  ranitidine (ZANTAC) 300 MG tablet, Take 300 mg by mouth as needed for heartburn., Disp: , Rfl:  .  Zoster Vac Recomb Adjuvanted (SHINGRIX) injection, Inject 0.5 mLs into the muscle  once. Receive booster vaccine 2-6 months after the 1st shot, Disp: 0.5 mL, Rfl: 1 There are no discontinued medications.  Next Medicare Wellness Visit in 12+ months  Problem List Items Addressed This Visit      Other   Preventative health care - Primary    USPSTF  grade A and B recommendations reviewed with patient; age-appropriate recommendations, preventive care, screening tests, etc discussed and encouraged; healthy living encouraged; see AVS for patient education given to patient      DNR no code (do not resuscitate)    Confirmed today       Other Visit Diagnoses    Vaccine counseling       discussed Shingrix vaccine; Rx printed and she can take that to her participating pharmacy

## 2017-03-20 NOTE — Assessment & Plan Note (Signed)
Confirmed today.  

## 2017-03-26 DIAGNOSIS — G4733 Obstructive sleep apnea (adult) (pediatric): Secondary | ICD-10-CM | POA: Diagnosis not present

## 2017-04-19 ENCOUNTER — Other Ambulatory Visit: Payer: Self-pay | Admitting: Family Medicine

## 2017-04-22 DIAGNOSIS — M810 Age-related osteoporosis without current pathological fracture: Secondary | ICD-10-CM | POA: Diagnosis not present

## 2017-05-05 DIAGNOSIS — H04123 Dry eye syndrome of bilateral lacrimal glands: Secondary | ICD-10-CM | POA: Diagnosis not present

## 2017-05-05 DIAGNOSIS — H524 Presbyopia: Secondary | ICD-10-CM | POA: Diagnosis not present

## 2017-05-05 DIAGNOSIS — H2513 Age-related nuclear cataract, bilateral: Secondary | ICD-10-CM | POA: Diagnosis not present

## 2017-05-05 DIAGNOSIS — H25013 Cortical age-related cataract, bilateral: Secondary | ICD-10-CM | POA: Diagnosis not present

## 2017-05-05 DIAGNOSIS — H43811 Vitreous degeneration, right eye: Secondary | ICD-10-CM | POA: Diagnosis not present

## 2017-05-06 ENCOUNTER — Encounter: Payer: Self-pay | Admitting: Family Medicine

## 2017-05-06 ENCOUNTER — Ambulatory Visit (INDEPENDENT_AMBULATORY_CARE_PROVIDER_SITE_OTHER): Payer: PPO | Admitting: Family Medicine

## 2017-05-06 VITALS — BP 110/70 | HR 75 | Temp 97.9°F | Resp 14 | Ht 63.0 in | Wt 127.5 lb

## 2017-05-06 DIAGNOSIS — N3001 Acute cystitis with hematuria: Secondary | ICD-10-CM

## 2017-05-06 LAB — POCT URINALYSIS DIPSTICK
BILIRUBIN UA: NEGATIVE
Glucose, UA: NEGATIVE
KETONES UA: NEGATIVE
Nitrite, UA: POSITIVE
SPEC GRAV UA: 1.015 (ref 1.010–1.025)
Urobilinogen, UA: 0.2 E.U./dL
pH, UA: 5 (ref 5.0–8.0)

## 2017-05-06 MED ORDER — SULFAMETHOXAZOLE-TRIMETHOPRIM 800-160 MG PO TABS: 1.0000 | ORAL_TABLET | Freq: Two times a day (BID) | ORAL | 0 refills | Status: AC

## 2017-05-06 NOTE — Patient Instructions (Addendum)

## 2017-05-06 NOTE — Progress Notes (Addendum)
Name: Kathy Baldwin   MRN: [id number]    DOB: 04-03-1946   Date:05/06/2017       Progress Note  Subjective  Chief Complaint  Chief Complaint  Patient presents with  . Back Pain    mid back right side for 2 days    HPI  Pt presents with RIGHT mid-back pain for about 2-3 days. She has had similar symptoms in June 2018 and was diagnosed with cystitis - treated with Nitrofurantoin with good efficacy. No dysuria, no frequency, urgency, frank blood in urine, no history of kidney stones.  Pain is mid back and did have one episode of right flank and right groin pain yesterday that got better as the day went on.  Denies nausea, had one episode of diarrhea on first day of symptoms.  Patient Active Problem List   Diagnosis Date Noted  . DNR no code (do not resuscitate) 03/20/2017  . Vaginal atrophy 02/18/2017  . Aortic atherosclerosis (HCC) 09/05/2016  . Bronchopneumonia 09/03/2016  . Allergic state 03/11/2016  . Hearing loss in left ear 12/04/2015  . Preventative health care 12/04/2015  . Need for hepatitis C screening test 08/16/2015  . Hypertension   . 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 GERD (gastroesophageal reflux disease)   . OSA on CPAP   . Hypertrophy of nasal turbinates   . Hyperlipidemia 03/28/2015  . Medication monitoring encounter 03/28/2015  . Allergic rhinitis 03/28/2015  . History of decompression of median nerve 02/14/2014  . Osteopenia 06/09/2013  . Synovitis and tenosynovitis 03/30/2013  . Foot pain 03/30/2013  . Obstructive apnea 10/27/2012  . Personal history of other specified conditions 08/13/2012  . Difficulty hearing 08/13/2012  . Groin pain 04/15/2012  . Metatarsalgia of right foot 08/13/2011    Social History  Substance Use Topics  . Smoking status: Never Smoker  . Smokeless tobacco: Never Used  . Alcohol use 0.0 oz/week     Comment: occasional-holidays     Current Outpatient Prescriptions:  .  amLODipine  (NORVASC) 2.5 MG tablet, Take 1 tablet (2.5 mg total) by mouth daily., Disp: 90 tablet, Rfl: 3 .  aspirin EC 81 MG tablet, Take 81 mg by mouth daily., Disp: , Rfl:  .  calcium carbonate (OSCAL) 1500 (600 CA) MG TABS tablet, Take 600 mg of elemental calcium by mouth 2 (two) times daily with a meal., Disp: , Rfl:  .  conjugated estrogens (PREMARIN) vaginal cream, Use a pea-sized amount externally and internally every night for 2 weeks, then just twice a week, Disp: 42.5 g, Rfl: 12 .  Cyanocobalamin (B-12) 2500 MCG TABS, Take 1 tablet by mouth daily., Disp: , Rfl:  .  fluticasone (FLONASE) 50 MCG/ACT nasal spray, SHAKE LIQUID AND USE 2 SPRAYS IN EACH NOSTRIL DAILY, Disp: 48 g, Rfl: 3 .  PEDIASURE/FIBER (PEDIASURE/FIBER) LIQD, Take 10 mLs by mouth., Disp: , Rfl:  .  polyethylene glycol (MIRALAX / GLYCOLAX) packet, Take 17 g by mouth daily., Disp: , Rfl:  .  ranitidine (ZANTAC) 300 MG tablet, Take 300 mg by mouth as needed for heartburn., Disp: , Rfl:   Allergies  Allergen Reactions  . Cabbage Other (See Comments)    Hypersensitivity on allergy testing  . Chocolate Other (See Comments)    Hypersensitivity on allergy testing  . Statins Other (See Comments)    Zocor and Pravastatin--leg cramps    ROS  Constitutional: Negative for fevers/chills or weight change.  Respiratory: Negative for cough and shortness  of breath.   Cardiovascular: Negative for chest pain or palpitations.  Gastrointestinal: Negative for abdominal pain, see HPI GU: See HPI Musculoskeletal: Negative for gait problem or joint swelling.  Skin: Negative for rash.  Neurological: Negative for dizziness or headache.  No other specific complaints in a complete review of systems (except as listed in HPI above).  Objective  Vitals:   05/06/17 1101  BP: 110/70  Pulse: 75  Resp: 14  Temp: 97.9 F (36.6 C)  TempSrc: Oral  SpO2: 95%  Weight: 127 lb 8 oz (57.8 kg)  Height: 5\' 3"  (1.6 m)    Body mass index is 22.59  kg/m.  Nursing Note and Vital Signs reviewed.  Physical Exam  Constitutional: Patient appears well-developed and well-nourished.  No distress.  HEENT: head atraumatic, normocephalic. Pulmonary/Chest: Effort normal and breath sounds clear. No respiratory distress or retractions. Abdominal: Soft and non-tender, bowel sounds present x4 quadrants.  RIGHT CVA tenderness is equivocal, no LEFT CVA tenderness. Psychiatric: Patient has a normal mood and affect. behavior is normal. Judgment and thought content normal.  Recent Results (from the past 2160 hour(s))  POCT urinalysis dipstick     Status: Abnormal   Collection Time: 02/18/17 10:59 AM  Result Value Ref Range   Color, UA yellow    Clarity, UA clear    Glucose, UA neg    Bilirubin, UA neg    Ketones, UA neg    Spec Grav, UA 1.025 1.010 - 1.025   Blood, UA 2+    pH, UA 6.5 5.0 - 8.0   Protein, UA neg    Urobilinogen, UA 0.2 0.2 or 1.0 E.U./dL   Nitrite, UA neg    Leukocytes, UA Large (3+) (A) Negative  Urine Culture     Status: None   Collection Time: 02/18/17 11:11 AM  Result Value Ref Range   Culture ESCHERICHIA COLI     Comment: SOURCE: URINE&URINE   Colony Count Greater than 100,000 CFU/mL    Organism ID, Bacteria ESCHERICHIA COLI       Susceptibility   Escherichia coli -  (no method available)    AMPICILLIN 4 Sensitive     AMOX/CLAVULANIC <=2 Sensitive     AMPICILLIN/SULBACTAM <=2 Sensitive     PIP/TAZO <=4 Sensitive     IMIPENEM <=0.25 Sensitive     CEFAZOLIN <=4 Not Reportable     CEFTRIAXONE <=1 Sensitive     CEFTAZIDIME <=1 Sensitive     CEFEPIME <=1 Sensitive     GENTAMICIN <=1 Sensitive     TOBRAMYCIN <=1 Sensitive     CIPROFLOXACIN <=0.25 Sensitive     LEVOFLOXACIN <=0.12 Sensitive     NITROFURANTOIN <=16 Sensitive     TRIMETH/SULFA* <=20 Sensitive      * NR=NOT REPORTABLE,SEE COMMENTORAL therapy:A cefazolin MIC of <32 predicts susceptibility to the oral agents  cefaclor,cefdinir,cefpodoxime,cefprozil,cefuroxime,cephalexin,and loracarbef when used for therapy of uncomplicated UTIs due to E.coli,K.pneumomiae,and P.mirabilis. PARENTERAL therapy: A cefazolinMIC of >8 indicates resistance to parenteralcefazolin. An alternate test method must beperformed to confirm susceptibility to parenteralcefazolin.  POCT urinalysis dipstick     Status: Abnormal   Collection Time: 05/06/17 11:11 AM  Result Value Ref Range   Color, UA yellow    Clarity, UA cloudy    Glucose, UA negative    Bilirubin, UA negative    Ketones, UA negative    Spec Grav, UA 1.015 1.010 - 1.025   Blood, UA trace    pH, UA 5.0 5.0 - 8.0   Protein, UA  trace    Urobilinogen, UA 0.2 0.2 or 1.0 E.U./dL   Nitrite, UA positive    Leukocytes, UA Trace (A) Negative     Assessment & Plan  1. Acute cystitis with hematuria - POCT urinalysis dipstick - Urine Culture - sulfamethoxazole-trimethoprim (BACTRIM DS,SEPTRA DS) 800-160 MG tablet; Take 1 tablet by mouth 2 (two) times daily.  Dispense: 10 tablet; Refill: 0 - Return in 2 weeks for urine dipstick (nurse visit) to ensure resolution of hematuria. - Due to possibility of pyelonephritis based on equivocal CVA tenderness on exam, we will treat for 5 days with Bactrim, and patient will call on day 5 if not improving, and we will extend treatment and/or consider imaging at that time.  -Red flags and when to present for emergency care or RTC including fever >101.59F, new/worsening/un-resolving symptoms, severe pain, frank blood in urine reviewed with patient at time of visit. Follow up and care instructions discussed and provided in AVS.  I have reviewed this encounter including the documentation in this note and/or discussed this patient with the Deboraha Sprang, FNP, NP-C. I am certifying that I agree with the content of this note as supervising physician.  Alba Cory, MD [facility] 05/06/2017,  12:08 PM

## 2017-05-06 NOTE — Addendum Note (Signed)
Addended by: Maurice Small E on: 05/06/2017 12:12 PM   Modules accepted: Level of Service

## 2017-05-08 LAB — URINE CULTURE

## 2017-05-20 ENCOUNTER — Other Ambulatory Visit: Payer: Self-pay | Admitting: Family Medicine

## 2017-05-20 ENCOUNTER — Other Ambulatory Visit (INDEPENDENT_AMBULATORY_CARE_PROVIDER_SITE_OTHER): Payer: PPO

## 2017-05-20 ENCOUNTER — Ambulatory Visit: Payer: PPO

## 2017-05-20 DIAGNOSIS — N390 Urinary tract infection, site not specified: Secondary | ICD-10-CM | POA: Diagnosis not present

## 2017-05-20 DIAGNOSIS — N3001 Acute cystitis with hematuria: Secondary | ICD-10-CM | POA: Diagnosis not present

## 2017-05-20 DIAGNOSIS — R8281 Pyuria: Secondary | ICD-10-CM

## 2017-05-20 LAB — POCT URINALYSIS DIPSTICK
BILIRUBIN UA: NEGATIVE
Glucose, UA: NEGATIVE
NITRITE UA: NEGATIVE
PH UA: 6.5 (ref 5.0–8.0)
RBC UA: NEGATIVE
Spec Grav, UA: 1.01 (ref 1.010–1.025)
Urobilinogen, UA: 0.2 E.U./dL

## 2017-05-20 NOTE — Progress Notes (Signed)
Patient presents for repeat Urine Dipstick nurse visit to ensure resolution of hematuria.  Hematuria has resolved, but she has moderate Leukocytes and trace Ketones. She took and finished Bactrim as prescribed and is no longer having symptoms. We will send urine for culture today to evaluate, and will hold on additional treatment at this time. Patient is agreeable with this plan.

## 2017-05-21 LAB — URINE CULTURE
MICRO NUMBER:: 80999718
SPECIMEN QUALITY: ADEQUATE

## 2017-07-22 ENCOUNTER — Encounter: Payer: Self-pay | Admitting: Family Medicine

## 2017-07-29 ENCOUNTER — Other Ambulatory Visit: Payer: Self-pay | Admitting: Family Medicine

## 2017-10-24 ENCOUNTER — Telehealth: Payer: Self-pay | Admitting: Family Medicine

## 2017-10-24 NOTE — Telephone Encounter (Signed)
Tried calling no voicemail setup, called cell and husband stated she was at home?

## 2017-10-24 NOTE — Telephone Encounter (Signed)
Copied from CRM [id number]. Topic: Quick Communication - Rx Refill/Question >> Oct 24, 2017  1:03 PM Maia Pettiesrtiz, Kristie S wrote: Pt is needing RX sent for cpap mask and supplies to [facility], [address], [place], [place] [address], Phone: [phone] She is supposed to get new machine this year too, please include on RX (pt has had her over 5 years).

## 2017-10-24 NOTE — Telephone Encounter (Signed)
Please ask her to schedule an appt to get this recertified. It looks like the last time CPAP was addressed was 2017. We'll be glad to get that for her, and will document at her visit use, symptoms, side effects, benefits, etc. Make sure she knows her settings and type so we can include that on her paperwork. We'll also need to verify date of last sleep study (find in chart or [facility] system if you can). Thank you

## 2017-10-24 NOTE — Telephone Encounter (Signed)
Last sleep study 01/23/14

## 2017-10-25 NOTE — Progress Notes (Signed)
Closing out lab/order note open since:  05/20/17

## 2017-10-25 NOTE — Telephone Encounter (Signed)
I came upon an old note that was still open Purpose of note completed Signing off

## 2017-10-27 NOTE — Telephone Encounter (Signed)
Notified and appt scheduled

## 2017-10-29 ENCOUNTER — Ambulatory Visit (INDEPENDENT_AMBULATORY_CARE_PROVIDER_SITE_OTHER): Payer: PPO | Admitting: Family Medicine

## 2017-10-29 ENCOUNTER — Encounter: Payer: Self-pay | Admitting: Family Medicine

## 2017-10-29 VITALS — BP 124/62 | HR 81 | Temp 98.3°F | Resp 14 | Wt 135.9 lb

## 2017-10-29 DIAGNOSIS — J0100 Acute maxillary sinusitis, unspecified: Secondary | ICD-10-CM

## 2017-10-29 DIAGNOSIS — R52 Pain, unspecified: Secondary | ICD-10-CM

## 2017-10-29 DIAGNOSIS — I10 Essential (primary) hypertension: Secondary | ICD-10-CM | POA: Diagnosis not present

## 2017-10-29 DIAGNOSIS — Z9989 Dependence on other enabling machines and devices: Secondary | ICD-10-CM

## 2017-10-29 DIAGNOSIS — J111 Influenza due to unidentified influenza virus with other respiratory manifestations: Secondary | ICD-10-CM

## 2017-10-29 DIAGNOSIS — R69 Illness, unspecified: Secondary | ICD-10-CM | POA: Diagnosis not present

## 2017-10-29 DIAGNOSIS — G4733 Obstructive sleep apnea (adult) (pediatric): Secondary | ICD-10-CM | POA: Diagnosis not present

## 2017-10-29 LAB — POCT INFLUENZA A/B
Influenza A, POC: NEGATIVE
Influenza B, POC: NEGATIVE

## 2017-10-29 MED ORDER — DOXYCYCLINE HYCLATE 100 MG PO TABS: 100.0000 mg | ORAL_TABLET | Freq: Two times a day (BID) | ORAL | 0 refills | Status: DC

## 2017-10-29 MED ORDER — BENZONATATE 100 MG PO CAPS: 100.0000 mg | ORAL_CAPSULE | Freq: Three times a day (TID) | ORAL | 0 refills | Status: DC | PRN

## 2017-10-29 NOTE — Patient Instructions (Signed)
Try vitamin C (orange juice if not diabetic or vitamin C tablets) and drink green tea to help your immune system during your illness Get plenty of rest and hydration Start the antibiotics Please do eat yogurt or kimchi or take a probiotic daily for the next month We want to replace the healthy germs in the gut If you notice foul, watery diarrhea in the next two months, schedule an appointment RIGHT AWAY or go to an urgent care or the emergency room if a holiday or over a weekend

## 2017-10-29 NOTE — Progress Notes (Signed)
BP 124/62   Pulse 81   Temp 98.3 F (36.8 C) (Oral)   Resp 14   Wt 135 lb 14.4 oz (61.6 kg)   SpO2 94%   BMI 24.07 kg/m    Subjective:    Patient ID: Kathy Baldwin, female    DOB: [birth year], 73 y.o.   MRN: [id number]  HPI: Kathy Baldwin is a 72 y.o. female  Chief Complaint  Patient presents with  . URI    onset 3 days sypmtoms include: congestion, runny nose, cough, headache, sore throat and sneezing  . Sleep Apnea    paperwork/forms    HPI Patient is here for illness; got sick really on Saturday; sneezing and drippy and coughing; no fevers; progressively getting worse; coughing up white phlegm, maybe some yellow or green; no SHOB or heaviness Really tired Sinus drainage, some pressure up in the forehead, some sinus headaches; might be developoing sinus infections Husband sick too [place] in January; no rash; no headache or neck stiffness Ribs sore She was taking sinus medicine, mucous medicine No hx of pneumonia; nonsmoker  She uses CPAP Gets dry eye from the air exit leakage; tried systane but caused tear duct to split open; no preservative [employer] helps a lot Nosebleed this morning, just a tinge; just will illness Using nasal mask Got this machine in 2014; test was in Feb 2014; got the machine and never changed the settings She does get benefit She uses it every night and even during daytime naps Feels much better on the PAP, no daytime somnolence; would still snore like crazy without it   Depression screen [facility] 2/9 10/29/2017 03/20/2017 02/18/2017 09/03/2016 12/04/2015  Decreased Interest 0 0 0 0 0  Down, Depressed, Hopeless 0 0 0 0 0  PHQ - 2 Score 0 0 0 0 0    Relevant past medical, surgical, family and social history reviewed Past Medical History:  Diagnosis Date  . Allergic rhinitis   . 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 Holly Bodily 2010  . DNR no code (do not resuscitate) 03/20/2017  . GERD  (gastroesophageal reflux disease)   . Hearing loss   . History of gallstones   . Hyperlipidemia   . Hypertension   . Hypertrophy of nasal turbinates   . Lymphocytosis   . Menopause age 76  . Multiple food allergies    chocolate and cabbage  . OSA on CPAP   . Osteopenia Oct. 2014  . Sleep apnea   . Vertigo   . Vitamin D deficiency disease    Past Surgical History:  Procedure Laterality Date  . BREAST CYST ASPIRATION Right    negative  . CARPAL TUNNEL RELEASE Right 2015  . CHOLECYSTECTOMY  15-20 years ago  . COLONOSCOPY  04/25/08  . DILATION AND CURETTAGE OF UTERUS  1982   s/p miscarriage   Family History  Problem Relation Age of Onset  . Lung disease Father        black lung  . Heart disease Father   . Hyperlipidemia Father   . Hypertension Father   . Emphysema Sister   . Diabetes Sister   . Emphysema Sister        died of complications of emphyema  . Cancer Brother        unsure of type  . Asthma Daughter   . Deep vein thrombosis Sister   . Thyroid disease Sister   . Stroke  Maternal Grandmother   . Hypertension Maternal Grandmother   . Diabetes Maternal Grandmother    Social History   Tobacco Use  . Smoking status: Never Smoker  . Smokeless tobacco: Never Used  Substance Use Topics  . Alcohol use: Yes    Alcohol/week: 0.0 oz    Comment: occasional-holidays  . Drug use: No    Interim medical history since last visit reviewed. Allergies and medications reviewed  Review of Systems Per HPI unless specifically indicated above     Objective:    BP 124/62   Pulse 81   Temp 98.3 F (36.8 C) (Oral)   Resp 14   Wt 135 lb 14.4 oz (61.6 kg)   SpO2 94%   BMI 24.07 kg/m   Wt Readings from Last 3 Encounters:  10/29/17 135 lb 14.4 oz (61.6 kg)  05/06/17 127 lb 8 oz (57.8 kg)  03/20/17 125 lb 4.8 oz (56.8 kg)    Physical Exam  Constitutional: She appears well-developed and well-nourished.  HENT:  Right Ear: Tympanic membrane and ear canal normal.    Left Ear: Tympanic membrane and ear canal normal.  Nose: Mucosal edema and rhinorrhea present. Left sinus exhibits maxillary sinus tenderness.  Mouth/Throat: Mucous membranes are normal. No oropharyngeal exudate, posterior oropharyngeal edema or posterior oropharyngeal erythema.  Eyes: EOM are normal. Right conjunctiva is injected. Right conjunctiva has no hemorrhage. Left conjunctiva is injected. Left conjunctiva has no hemorrhage. No scleral icterus.  Cardiovascular: Normal rate and regular rhythm.  Pulmonary/Chest: Effort normal and breath sounds normal.  Skin: She is not diaphoretic. No pallor.  Psychiatric: She has a normal mood and affect. Her behavior is normal.    Results for orders placed or performed in visit on 10/29/17  POCT Influenza A/B  Result Value Ref Range   Influenza A, POC Negative Negative   Influenza B, POC Negative Negative      Assessment & Plan:   Problem List Items Addressed This Visit      Cardiovascular and Mediastinum   Hypertension    Under excellent control        Respiratory   OSA on CPAP    Self-titrating machine; patient received benefit from use; uses nightly and during daytime; I believe this CPAP is medically necessary       Other Visit Diagnoses    Influenza-like illness    -  Primary   flu A and B negative; sick for five days now; rest, hydration   Body aches       tested for flu, negative   Relevant Orders   POCT Influenza A/B (Completed)   Acute non-recurrent maxillary sinusitis       secondary infection after initial virus; start ABX; see cautions about C diff   Relevant Medications   benzonatate (TESSALON) 100 MG capsule   doxycycline (VIBRA-TABS) 100 MG tablet       Follow up plan: Return for (okay to cancel March 6th visit).  An after-visit summary was printed and given to the patient at check-out.  Please see the patient instructions which may contain other information and recommendations beyond what is mentioned above  in the assessment and plan.  Meds ordered this encounter  Medications  . benzonatate (TESSALON) 100 MG capsule    Sig: Take 1 capsule (100 mg total) by mouth 3 (three) times daily as needed for cough.    Dispense:  30 capsule    Refill:  0  . doxycycline (VIBRA-TABS) 100 MG tablet  Sig: Take 1 tablet (100 mg total) by mouth 2 (two) times daily.    Dispense:  20 tablet    Refill:  0    Orders Placed This Encounter  Procedures  . POCT Influenza A/B

## 2017-10-29 NOTE — Assessment & Plan Note (Signed)
Self-titrating machine; patient received benefit from use; uses nightly and during daytime; I believe this CPAP is medically necessary

## 2017-10-29 NOTE — Assessment & Plan Note (Signed)
Under excellent control 

## 2017-11-12 ENCOUNTER — Ambulatory Visit: Payer: Self-pay | Admitting: Family Medicine

## 2017-12-02 ENCOUNTER — Telehealth: Payer: Self-pay | Admitting: Family Medicine

## 2017-12-02 NOTE — Telephone Encounter (Signed)
Is this something we do or pulmonary? Please advise. Thanks

## 2017-12-02 NOTE — Telephone Encounter (Signed)
Copied from CRM [phone]#[id number]. Topic: Quick Communication - See Telephone Encounter >> Dec 02, 2017 10:58 AM Diana EvesHoyt, Maryann B wrote: CRM for notification. See Telephone encounter for: 12/02/17.  Pt requesting a new script for CPAP. She is changing vendors. Pt will come and pick up the script when it is ready.

## 2017-12-02 NOTE — Telephone Encounter (Signed)
Please write out Rx See 10/18/2016 under media tab

## 2017-12-03 NOTE — Telephone Encounter (Signed)
Rx written, placed on providers desk 

## 2017-12-08 NOTE — Telephone Encounter (Signed)
Called pt she states that she needs to pick up the rx for CPAP, as she is switching companies (no longer using [facility]) informed pt she could drop by to get RX at her convenience. Pt gave verbal understanding.

## 2017-12-08 NOTE — Telephone Encounter (Signed)
Patient checking status  [phone]

## 2018-01-02 DIAGNOSIS — G4733 Obstructive sleep apnea (adult) (pediatric): Secondary | ICD-10-CM | POA: Diagnosis not present

## 2018-02-01 DIAGNOSIS — G4733 Obstructive sleep apnea (adult) (pediatric): Secondary | ICD-10-CM | POA: Diagnosis not present

## 2018-02-11 ENCOUNTER — Other Ambulatory Visit: Payer: Self-pay | Admitting: Internal Medicine

## 2018-02-11 DIAGNOSIS — M8589 Other specified disorders of bone density and structure, multiple sites: Secondary | ICD-10-CM

## 2018-02-20 ENCOUNTER — Ambulatory Visit
Admission: RE | Admit: 2018-02-20 | Discharge: 2018-02-20 | Disposition: A | Discharge status: Home / Self Care | Payer: PPO | Source: Ambulatory Visit | Attending: Internal Medicine | Admitting: Internal Medicine

## 2018-02-20 DIAGNOSIS — M85851 Other specified disorders of bone density and structure, right thigh: Secondary | ICD-10-CM | POA: Diagnosis not present

## 2018-02-20 DIAGNOSIS — M85852 Other specified disorders of bone density and structure, left thigh: Secondary | ICD-10-CM | POA: Diagnosis not present

## 2018-02-20 DIAGNOSIS — Z780 Asymptomatic menopausal state: Secondary | ICD-10-CM | POA: Diagnosis not present

## 2018-02-20 DIAGNOSIS — M8589 Other specified disorders of bone density and structure, multiple sites: Secondary | ICD-10-CM | POA: Diagnosis not present

## 2018-03-04 DIAGNOSIS — G4733 Obstructive sleep apnea (adult) (pediatric): Secondary | ICD-10-CM | POA: Diagnosis not present

## 2018-03-09 ENCOUNTER — Ambulatory Visit (INDEPENDENT_AMBULATORY_CARE_PROVIDER_SITE_OTHER): Payer: PPO | Admitting: Family Medicine

## 2018-03-09 ENCOUNTER — Encounter: Payer: Self-pay | Admitting: Family Medicine

## 2018-03-09 VITALS — BP 128/56 | HR 56 | Temp 98.2°F | Resp 16 | Ht 63.0 in | Wt 134.5 lb

## 2018-03-09 DIAGNOSIS — Z1211 Encounter for screening for malignant neoplasm of colon: Secondary | ICD-10-CM

## 2018-03-09 DIAGNOSIS — I10 Essential (primary) hypertension: Secondary | ICD-10-CM | POA: Diagnosis not present

## 2018-03-09 DIAGNOSIS — M8589 Other specified disorders of bone density and structure, multiple sites: Secondary | ICD-10-CM | POA: Diagnosis not present

## 2018-03-09 DIAGNOSIS — Z1239 Encounter for other screening for malignant neoplasm of breast: Secondary | ICD-10-CM

## 2018-03-09 DIAGNOSIS — R42 Dizziness and giddiness: Secondary | ICD-10-CM | POA: Diagnosis not present

## 2018-03-09 DIAGNOSIS — Z1231 Encounter for screening mammogram for malignant neoplasm of breast: Secondary | ICD-10-CM | POA: Diagnosis not present

## 2018-03-09 DIAGNOSIS — R55 Syncope and collapse: Secondary | ICD-10-CM

## 2018-03-09 NOTE — Assessment & Plan Note (Signed)
Stop the CCB; monitor BP and contact me if going above 140/90; try DASH guidelines

## 2018-03-09 NOTE — Progress Notes (Signed)
BP (!) 128/56 (BP Location: Right Arm, Patient Position: Sitting, Cuff Size: Large)   Pulse (!) 56   Temp 98.2 F (36.8 C) (Oral)   Resp 16   Ht 5\' 3"  (1.6 m)   Wt 134 lb 8 oz (61 kg)   SpO2 97%   BMI 23.83 kg/m    Subjective:    Patient ID: Kathy Baldwin, female    DOB: 01/13/1946, 72 y.o.   MRN: [id number]  HPI: Kathy Baldwin is a 72 y.o. female  Chief Complaint  Patient presents with  . Near Syncope    patient is unsure if she actually had a vertigo flare or not. 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 and knocking them off; got to a certain point and almost fell and twitched; she remembers the whole thing; did not lose consciousness It acted like vertigo but no precursor; she did not feel vertiginous at all Afternoon time frame Ate normally; not all up all night Everything was pretty normal It was not especially hot outside, June 9th; 8:48 pm She had taken the dog out to do his business and just noticed the leaves on the tree and started gathering them to through out Came back in to the house; no residual effects; acted normally; did not feel tired Father had cardiac atherosclerosis, died at age 69  She has not seen ENT for vertigo or neurologist  Depression screen [facility] 2/9 03/09/2018 10/29/2017 03/20/2017 02/18/2017 09/03/2016  Decreased Interest 0 0 0 0 0  Down, Depressed, Hopeless 0 0 0 0 0  PHQ - 2 Score 0 0 0 0 0    Relevant past medical, surgical, family and social history reviewed Past Medical History:  Diagnosis Date  . Allergic rhinitis   . 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 Holly Baldwin 2010  . DNR no code (do not resuscitate) 03/20/2017  . GERD (gastroesophageal reflux disease)   . Hearing  loss   . History of gallstones   . Hyperlipidemia   . Hypertension   . Hypertrophy of nasal turbinates   . Lymphocytosis   . Menopause age 55  . Multiple food allergies    chocolate and cabbage  . OSA on CPAP   . Osteopenia Oct. 2014  . Sleep apnea   . Vertigo   . Vitamin D deficiency disease    Past Surgical History:  Procedure Laterality Date  . BREAST CYST ASPIRATION Right    negative  . CARPAL TUNNEL RELEASE Right 2015  . CHOLECYSTECTOMY  15-20 years ago  . COLONOSCOPY  04/25/08  . DILATION AND CURETTAGE OF UTERUS  1982   s/p miscarriage   Family History  Problem Relation Age of Onset  . Lung disease Father        black lung  . Heart disease Father   . Hyperlipidemia Father   . Hypertension Father   . Emphysema Sister   . Diabetes Sister   . Emphysema Sister        died of complications of emphyema  . Cancer Brother        unsure of type  . Asthma Daughter   . Deep vein thrombosis Sister   .  Thyroid disease Sister   . Stroke Maternal Grandmother   . Hypertension Maternal Grandmother   . Diabetes Maternal Grandmother    Social History   Tobacco Use  . Smoking status: Never Smoker  . Smokeless tobacco: Never Used  Substance Use Topics  . Alcohol use: Yes    Alcohol/week: 0.0 oz    Comment: occasional-holidays  . Drug use: No    Interim medical history since last visit reviewed. Allergies and medications reviewed  Review of Systems Per HPI unless specifically indicated above     Objective:    BP (!) 128/56 (BP Location: Right Arm, Patient Position: Sitting, Cuff Size: Large)   Pulse (!) 56   Temp 98.2 F (36.8 C) (Oral)   Resp 16   Ht 5\' 3"  (1.6 m)   Wt 134 lb 8 oz (61 kg)   SpO2 97%   BMI 23.83 kg/m   Wt Readings from Last 3 Encounters:  03/09/18 134 lb 8 oz (61 kg)  10/29/17 135 lb 14.4 oz (61.6 kg)  05/06/17 127 lb 8 oz (57.8 kg)    Physical Exam  Constitutional: She appears well-developed and well-nourished. No distress.  HENT:    Head: Normocephalic and atraumatic.  Eyes: EOM are normal. No scleral icterus.  Neck: Carotid bruit is not present. No thyromegaly present.  Cardiovascular: Normal rate and regular rhythm.  Murmur heard.  Systolic murmur is present with a grade of 1/6. RUSB; accentuated 2nd heart sound  Pulmonary/Chest: Effort normal and breath sounds normal. No respiratory distress. She has no wheezes.  Abdominal: Soft. Bowel sounds are normal. She exhibits no distension.  Musculoskeletal: She exhibits no edema.  Neurological: She is alert. She exhibits normal muscle tone. Coordination normal.  Skin: Skin is warm and dry. She is not diaphoretic. No pallor.  Psychiatric: She has a normal mood and affect. Her behavior is normal. Judgment and thought content normal.    Results for orders placed or performed in visit on 10/29/17  POCT Influenza A/B  Result Value Ref Range   Influenza A, POC Negative Negative   Influenza B, POC Negative Negative      Assessment & Plan:   Problem List Items Addressed This Visit      Cardiovascular and Mediastinum   Hypertension    Stop the CCB; monitor BP and contact me if going above 140/90; try DASH guidelines       Other Visit Diagnoses    Syncope, unspecified syncope type    -  Primary   Relevant Orders   US Carotid Duplex Bilateral   ECHOCARDIOGRAM COMPLETE   Vertigo       Relevant Orders   Ambulatory referral to ENT   Colon cancer screening       Relevant Orders   Cologuard   Breast cancer screening       Relevant Orders   MM DIGITAL SCREENING BILATERAL       Follow up plan: No follow-ups on file.  An after-visit summary was printed and given to the patient at check-out.  Please see the patient instructions which may contain other information and recommendations beyond what is mentioned above in the assessment and plan.  No orders of the defined types were placed in this encounter.   Orders Placed This Encounter  Procedures  . US Carotid  Duplex Bilateral  . MM DIGITAL SCREENING BILATERAL  . Cologuard  . Ambulatory referral to ENT  . ECHOCARDIOGRAM COMPLETE

## 2018-03-09 NOTE — Patient Instructions (Addendum)
Please call [phone] to schedule your imaging test (carotid ultrasound) Please wait 2-3 days after the order has been placed to call and get your test scheduled  Please call [phone] to schedule your echocardiogram Please wait 2-3 days after the order has been placed to call and get your echocardiogram scheduled  Do stay well-hydrated If another episode occurs, seek medical attention or advice  Stop the blood pressure for now We'll have you see the Ear Nose Throat doctor  Monitor your blood pressure off of the medicine and notify me if over 140/90  Try to follow the DASH guidelines (DASH stands for Dietary Approaches to Stop Hypertension). Try to limit the sodium in your diet to no more than 1,500mg  of sodium per day. Certainly try to not exceed 2,000 mg per day at the very most. Do not add salt when cooking or at the table.  Check the sodium amount on labels when shopping, and choose items lower in sodium when given a choice. Avoid or limit foods that already contain a lot of sodium. Eat a diet rich in fruits and vegetables and whole grains, and try to lose weight if overweight or obese

## 2018-03-15 DIAGNOSIS — Z1211 Encounter for screening for malignant neoplasm of colon: Secondary | ICD-10-CM | POA: Diagnosis not present

## 2018-03-16 DIAGNOSIS — Z780 Asymptomatic menopausal state: Secondary | ICD-10-CM | POA: Diagnosis not present

## 2018-03-16 DIAGNOSIS — M8589 Other specified disorders of bone density and structure, multiple sites: Secondary | ICD-10-CM | POA: Diagnosis not present

## 2018-03-24 LAB — COLOGUARD: Cologuard: NEGATIVE

## 2018-04-02 ENCOUNTER — Ambulatory Visit
Admission: RE | Admit: 2018-04-02 | Discharge: 2018-04-02 | Disposition: A | Discharge status: Home / Self Care | Payer: PPO | Source: Ambulatory Visit | Attending: Family Medicine | Admitting: Family Medicine

## 2018-04-02 DIAGNOSIS — Z1231 Encounter for screening mammogram for malignant neoplasm of breast: Secondary | ICD-10-CM | POA: Insufficient documentation

## 2018-04-02 DIAGNOSIS — Z1239 Encounter for other screening for malignant neoplasm of breast: Secondary | ICD-10-CM

## 2018-04-03 ENCOUNTER — Telehealth: Payer: Self-pay | Admitting: Family Medicine

## 2018-04-03 DIAGNOSIS — G4733 Obstructive sleep apnea (adult) (pediatric): Secondary | ICD-10-CM | POA: Diagnosis not present

## 2018-04-03 NOTE — Telephone Encounter (Signed)
Left message asking pt to call and schedule AWV w/ [facility]. Last AWV 03/20/17

## 2018-04-06 NOTE — Telephone Encounter (Signed)
Pt has been sch for 04-17-18

## 2018-04-08 DIAGNOSIS — G4733 Obstructive sleep apnea (adult) (pediatric): Secondary | ICD-10-CM | POA: Diagnosis not present

## 2018-04-10 ENCOUNTER — Ambulatory Visit
Admission: RE | Admit: 2018-04-10 | Discharge: 2018-04-10 | Disposition: A | Discharge status: Home / Self Care | Payer: PPO | Source: Ambulatory Visit | Attending: Family Medicine | Admitting: Family Medicine

## 2018-04-10 DIAGNOSIS — R55 Syncope and collapse: Secondary | ICD-10-CM | POA: Insufficient documentation

## 2018-04-10 DIAGNOSIS — K219 Gastro-esophageal reflux disease without esophagitis: Secondary | ICD-10-CM | POA: Diagnosis not present

## 2018-04-10 DIAGNOSIS — I6523 Occlusion and stenosis of bilateral carotid arteries: Secondary | ICD-10-CM | POA: Diagnosis not present

## 2018-04-10 DIAGNOSIS — E785 Hyperlipidemia, unspecified: Secondary | ICD-10-CM | POA: Diagnosis not present

## 2018-04-10 DIAGNOSIS — I10 Essential (primary) hypertension: Secondary | ICD-10-CM | POA: Diagnosis not present

## 2018-04-10 DIAGNOSIS — G4733 Obstructive sleep apnea (adult) (pediatric): Secondary | ICD-10-CM | POA: Diagnosis not present

## 2018-04-10 DIAGNOSIS — R0602 Shortness of breath: Secondary | ICD-10-CM | POA: Diagnosis not present

## 2018-04-10 NOTE — Progress Notes (Signed)
*  PRELIMINARY RESULTS* Echocardiogram 2D Echocardiogram has been performed.  Cristela BlueHege, Usbaldo Pannone 04/10/2018, 10:28 AM

## 2018-04-13 DIAGNOSIS — R42 Dizziness and giddiness: Secondary | ICD-10-CM | POA: Diagnosis not present

## 2018-04-14 ENCOUNTER — Other Ambulatory Visit: Payer: Self-pay | Admitting: Family Medicine

## 2018-04-14 DIAGNOSIS — E041 Nontoxic single thyroid nodule: Secondary | ICD-10-CM | POA: Insufficient documentation

## 2018-04-14 DIAGNOSIS — I6523 Occlusion and stenosis of bilateral carotid arteries: Secondary | ICD-10-CM

## 2018-04-14 NOTE — Progress Notes (Signed)
See carotid report; order thyroid US and refer to endo

## 2018-04-14 NOTE — Assessment & Plan Note (Addendum)
Order dedicated thyroid US, refer to endo

## 2018-04-17 ENCOUNTER — Other Ambulatory Visit: Payer: Self-pay

## 2018-04-17 ENCOUNTER — Ambulatory Visit (INDEPENDENT_AMBULATORY_CARE_PROVIDER_SITE_OTHER): Payer: PPO

## 2018-04-17 VITALS — BP 142/68 | HR 55 | Temp 97.6°F | Resp 12 | Ht 63.0 in | Wt 134.8 lb

## 2018-04-17 DIAGNOSIS — I6523 Occlusion and stenosis of bilateral carotid arteries: Secondary | ICD-10-CM

## 2018-04-17 DIAGNOSIS — Z0000 Encounter for general adult medical examination without abnormal findings: Secondary | ICD-10-CM

## 2018-04-17 LAB — LIPID PANEL
CHOL/HDL RATIO: 3.2 (calc) (ref <5.0)
Cholesterol: 273 mg/dL — ABNORMAL HIGH (ref <200)
HDL: 86 mg/dL (ref >50)
LDL Cholesterol (Calc): 169 mg/dL (calc) — ABNORMAL HIGH
Non-HDL Cholesterol (Calc): 187 mg/dL (calc) — ABNORMAL HIGH (ref <130)
Triglycerides: 80 mg/dL (ref <150)

## 2018-04-17 NOTE — Progress Notes (Signed)
Subjective:   Kathy Baldwin is a 72 y.o. female who presents for Medicare Annual (Subsequent) preventive examination.  Review of Systems:  N/A Cardiac Risk Factors include: advanced age (>29men, >69 women);dyslipidemia;hypertension;sedentary lifestyle     Objective:     Vitals: BP (!) 142/68 (BP Location: Left Arm, Patient Position: Sitting, Cuff Size: Normal)   Pulse (!) 55   Temp 97.6 F (36.4 C) (Oral)   Resp 12   Ht 5\' 3"  (1.6 m)   Wt 134 lb 12.8 oz (61.1 kg)   SpO2 97%   BMI 23.88 kg/m   Body mass index is 23.88 kg/m.  Advanced Directives 04/17/2018 05/06/2017 03/20/2017 02/18/2017 09/03/2016 08/31/2016 02/08/2016  Does Patient Have a Medical Advance Directive? Yes Yes Yes Yes Yes Yes Yes  Type of Estate agent of [place];Living will Healthcare Power of [place];Living will - - Healthcare Power of [place];Living will Living will Healthcare Power of [place];Living will  Does patient want to make changes to medical advance directive? - - - - - - No - Patient declined  Copy of Healthcare Power of Attorney in Chart? No - copy requested - - - - - No - copy requested    Tobacco Social History   Tobacco Use  Smoking Status Never Smoker  Smokeless Tobacco Never Used  Tobacco Comment   smoking cessation materials not required     Counseling given: No Comment: smoking cessation materials not required  Clinical Intake:  Pre-visit preparation completed: Yes  Pain : No/denies pain   BMI - recorded: 23.88 Nutritional Status: BMI of 19-24  Normal Nutritional Risks: None Diabetes: No  How often do you need to have someone help you when you read instructions, pamphlets, or other written materials from your doctor or pharmacy?: 1 - Never  Interpreter Needed?: No  Information entered by :: AEversole, LPN  Past Medical History:  Diagnosis Date  . Allergic rhinitis   . ASCUS with positive high risk human papillomavirus of vagina    colpo done  by Dr. Holly Bodily 2010  . DNR no code (do not resuscitate) 03/20/2017  . GERD (gastroesophageal reflux disease)   . Hearing loss   . History of gallstones   . Hyperlipidemia   . Hypertension   . Hypertrophy of nasal turbinates   . Lymphocytosis   . Menopause age 24  . Multiple food allergies    chocolate and cabbage  . OSA on CPAP   . Osteopenia Oct. 2014  . Sleep apnea   . Vertigo   . Vitamin D deficiency disease    Past Surgical History:  Procedure Laterality Date  . BREAST CYST ASPIRATION Right    negative  . CARPAL TUNNEL RELEASE Right 2015  . CHOLECYSTECTOMY  15-20 years ago  . COLONOSCOPY  04/25/08  . DILATION AND CURETTAGE OF UTERUS  1982   s/p miscarriage   Family History  Problem Relation Age of Onset  . Healthy Mother   . Lung disease Father        black lung  . Heart disease Father   . Hyperlipidemia Father   . Hypertension Father   . Emphysema Sister   . Healthy Sister   . Cancer Brother        unsure of type  . Asthma Daughter   . Heart disease Sister   . Thyroid disease Sister   . Stroke Maternal Grandmother   . Hypertension Maternal Grandmother   . Diabetes Maternal Grandmother   . Diabetes Sister   .  Healthy Sister   . Breast cancer Neg Hx    Social History   Socioeconomic History  . Marital status: Married    Spouse name: [name]  . Number of children: 1  . Years of education: Not on file  . Highest education level: Bachelor's degree (e.g., BA, AB, BS)  Occupational History  . Occupation: Retired  Engineer, production  . Financial resource strain: Not hard at all  . Food insecurity:    Worry: Never true    Inability: Never true  . Transportation needs:    Medical: No    Non-medical: No  Tobacco Use  . Smoking status: Never Smoker  . Smokeless tobacco: Never Used  . Tobacco comment: smoking cessation materials not required  Substance and Sexual Activity  . Alcohol use: Yes    Alcohol/week: 0.0 standard drinks    Comment: occasional-holidays  .  Drug use: No  . Sexual activity: Yes    Partners: Male  Lifestyle  . Physical activity:    Days per week: 0 days    Minutes per session: 0 min  . Stress: Not at all  Relationships  . Social connections:    Talks on phone: Patient refused    Gets together: Patient refused    Attends religious service: Patient refused    Active member of club or organization: Patient refused    Attends meetings of clubs or organizations: Patient refused    Relationship status: Married  Other Topics Concern  . Not on file  Social History Narrative  . Not on file    Outpatient Encounter Medications as of 04/17/2018  Medication Sig  . aspirin EC 81 MG tablet Take 81 mg by mouth daily.  . calcium carbonate (OSCAL) 1500 (600 CA) MG TABS tablet Take 600 mg of elemental calcium by mouth 2 (two) times daily with a meal.  . conjugated estrogens (PREMARIN) vaginal cream Use a pea-sized amount externally and internally every night for 2 weeks, then just twice a week  . Cyanocobalamin (B-12) 2500 MCG TABS Take 1 tablet by mouth daily.  . fluticasone (FLONASE) 50 MCG/ACT nasal spray SHAKE LIQUID AND USE 2 SPRAYS IN EACH NOSTRIL DAILY  . PEDIASURE/FIBER (PEDIASURE/FIBER) LIQD Take 10 mLs by mouth.  . polyethylene glycol (MIRALAX / GLYCOLAX) packet Take 17 g by mouth daily.  . ranitidine (ZANTAC) 300 MG tablet TAKE 1 TABLET(300 MG) BY MOUTH AT BEDTIME FOR HEARTBURN OR REFLUX  . scopolamine (TRANSDERM-SCOP) 1 MG/3DAYS Place 1 mg onto the skin every 3 (three) days.   No facility-administered encounter medications on file as of 04/17/2018.     Activities of Daily Living In your present state of health, do you have any difficulty performing the following activities: 04/17/2018 03/09/2018  Hearing? N N  Comment B hearing aids; vertigo -  Vision? N N  Comment wears eyeglasses -  Difficulty concentrating or making decisions? Y N  Comment short term memory loss -  Walking or climbing stairs? N N  Dressing or bathing? N  N  Doing errands, shopping? N N  Preparing Food and eating ? N -  Comment denies dentures -  Using the Toilet? N -  In the past six months, have you accidently leaked urine? N -  Do you have problems with loss of bowel control? N -  Managing your Medications? N -  Managing your Finances? N -  Housekeeping or managing your Housekeeping? N -  Some recent data might be hidden    Patient Care  Team: Kerman PasseyLada, Melinda P, MD as PCP - General (Family Medicine) Midge MiniumWohl, Darren, MD as Consulting Physician (Gastroenterology) [facility], [facility] (Optometry) Solum, Marlana SalvageAnna M, MD as Physician Assistant (Endocrinology) Linus SalmonsMcQueen, Chapman, MD as Consulting Physician (Otolaryngology)    Assessment:   This is a routine wellness examination for Kathy NixonJanice.  Exercise Activities and Dietary recommendations Current Exercise Habits: The patient does not participate in regular exercise at present, Exercise limited by: None identified  Goals    . DIET - INCREASE WATER INTAKE     Recommend to drink at least 6-8 8oz glasses of water per day.       Fall Risk Fall Risk  04/17/2018 03/09/2018 10/29/2017 03/20/2017 02/18/2017  Falls in the past year? No Yes No No No  Comment removing dead leaves from a fig tree and fell to the ground - - - -  Number falls in past yr: 1 1 - - -  Injury with Fall? No No - - -  Risk for fall due to : Impaired vision;Other (Comment);Impaired balance/gait;History of fall(s) - - - -  Risk for fall due to: Comment wears eyeglasses; vertigo - - - -  Follow up Falls evaluation completed;Education provided;Falls prevention discussed - - - -   FALL RISK PREVENTION PERTAINING TO HOME: Is your home free of loose throw rugs in walkways, pet beds, electrical cords, etc? Yes Is there adequate lighting in your home to reduce risk of falls?  Yes Are there stairs in or around your home WITH handrails? Yes  ASSISTIVE DEVICES UTILIZED TO PREVENT FALLS: Use of a cane, walker or w/c? No Grab bars in the  bathroom? No  Shower chair or a place to sit while bathing? No An elevated toilet seat or a handicapped toilet? No  Timed Get Up and Go Performed: Yes. Pt ambulated 10 feet within 11 sec. Gait slow, steady and without the use of an assistive device. No intervention required at this time. Fall risk prevention has been discussed.  Community Resource Referral:  Pt declined my offer to send [employer] Corporation[employer] Resource Referral to Care Guide for installation of grab bars in the shower, shower chair or an elevated toilet seat.  Depression Screen PHQ 2/9 Scores 04/17/2018 03/09/2018 10/29/2017 03/20/2017  PHQ - 2 Score 0 0 0 0  PHQ- 9 Score 0 - - -     Cognitive Function     6CIT Screen 04/17/2018 03/20/2017  What Year? 0 points 0 points  What month? 0 points 0 points  What time? 0 points 0 points  Count back from 20 0 points 0 points  Months in reverse 0 points 0 points  Repeat phrase 0 points 0 points  Total Score 0 0    Immunization History  Administered Date(s) Administered  . Influenza, High Dose Seasonal PF 06/27/2017  . Influenza, Seasonal, Injecte, Preservative Fre 05/25/2015  . Influenza-Unspecified 06/12/2011, 05/10/2014, 06/22/2016, 06/27/2017  . Pneumococcal Conjugate-13 11/30/2014  . Pneumococcal Polysaccharide-23 08/13/2011, 10/01/2013  . Td 06/09/2002  . Tdap 09/10/2011, 08/13/2012  . Zoster 08/13/2012    Qualifies for Shingles Vaccine? Yes. Zostavax completed 08/23/12. Due for Shingrix. Education has been provided regarding the importance of this vaccine. Pt has been advised to call insurance company to determine out of pocket expense. Advised may also receive vaccine at local pharmacy or Health Dept. Verbalized acceptance and understanding.  Screening Tests Health Maintenance  Topic Date Due  . INFLUENZA VACCINE  04/09/2018  . MAMMOGRAM  04/03/2019  . Fecal DNA (Cologuard)  03/24/2021  .  TETANUS/TDAP  08/13/2022  . DEXA SCAN  Completed  . Hepatitis C Screening  Completed    . PNA vac Low Risk Adult  Completed    Cancer Screenings: Lung: Low Dose CT Chest recommended if Age 17-80 years, 30 pack-year currently smoking OR have quit w/in 15years. Patient does not qualify. Breast:  Up to date on Mammogram? Yes. Completed 04/02/18. Repeat every year   Up to date of Bone Density/Dexa? Yes. Completed 02/20/18. Results reflect osteopenia. Repeat every 2 years Colorectal: Completed Cologuard 03/24/18. Repeat every 3 years  Additional Screenings: Hepatitis C Screening: Completed 08/29/15    Plan:  I have personally reviewed and addressed the Medicare Annual Wellness questionnaire and have noted the following in the patient's chart:  A. Medical and social history B. Use of alcohol, tobacco or illicit drugs  C. Current medications and supplements D. Functional ability and status E.  Nutritional status F.  Physical activity G. Advance directives H. List of other physicians I.  Hospitalizations, surgeries, and ER visits in previous 12 months J.  Vitals K. Screenings such as hearing and vision if needed, cognitive and depression L. Referrals and appointments  In addition, I have reviewed and discussed with patient certain preventive protocols, quality metrics, and best practice recommendations. A written personalized care plan for preventive services as well as general preventive health recommendations were provided to patient.  See attached scanned questionnaire for additional information.   Signed,  Deon Pilling, LPN Nurse Health Advisor

## 2018-04-17 NOTE — Patient Instructions (Addendum)
Ms. Kathy Baldwin , Thank you for taking time to come for your Medicare Wellness Visit. I appreciate your ongoing commitment to your health goals. Please review the following plan we discussed and let me know if I can assist you in the future.   Screening recommendations/referrals: Colorectal Screening: Up to date Mammogram: Up to date Bone Density: Up to date  Vision and Dental Exams: Recommended annual ophthalmology exams for early detection of glaucoma and other disorders of the eye Recommended annual dental exams for proper oral hygiene  Vaccinations: Influenza vaccine: Up to date Pneumococcal vaccine: Up to date Tdap vaccine: Up to date Shingles vaccine: Please call your insurance company to determine your out of pocket expense for the Shingrix vaccine. You may also receive this vaccine at your local pharmacy or Health Dept.    Advanced directives: Please bring a copy of your POA (Power of Attorney) and/or Living Will to your next appointment.  Goals: Recommend to drink at least 6-8 8oz glasses of water per day.  Next appointment: Please schedule your Annual Wellness Visit with your Nurse Health Advisor in one year.  Preventive Care 6365 Years and Older, Female Preventive care refers to lifestyle choices and visits with your health care provider that can promote health and wellness. What does preventive care include?  A yearly physical exam. This is also called an annual well check.  Dental exams once or twice a year.  Routine eye exams. Ask your health care provider how often you should have your eyes checked.  Personal lifestyle choices, including:  Daily care of your teeth and gums.  Regular physical activity.  Eating a healthy diet.  Avoiding tobacco and drug use.  Limiting alcohol use.  Practicing safe sex.  Taking low-dose aspirin every day.  Taking vitamin and mineral supplements as recommended by your health care provider. What happens during an annual well  check? The services and screenings done by your health care provider during your annual well check will depend on your age, overall health, lifestyle risk factors, and family history of disease. Counseling  Your health care provider may ask you questions about your:  Alcohol use.  Tobacco use.  Drug use.  Emotional well-being.  Home and relationship well-being.  Sexual activity.  Eating habits.  History of falls.  Memory and ability to understand (cognition).  Work and work Astronomerenvironment.  Reproductive health. Screening  You may have the following tests or measurements:  Height, weight, and BMI.  Blood pressure.  Lipid and cholesterol levels. These may be checked every 5 years, or more frequently if you are over 74 years old.  Skin check.  Lung cancer screening. You may have this screening every year starting at age 10255 if you have a 30-pack-year history of smoking and currently smoke or have quit within the past 15 years.  Fecal occult blood test (FOBT) of the stool. You may have this test every year starting at age 74.  Flexible sigmoidoscopy or colonoscopy. You may have a sigmoidoscopy every 5 years or a colonoscopy every 10 years starting at age 74.  Hepatitis C blood test.  Hepatitis B blood test.  Sexually transmitted disease (STD) testing.  Diabetes screening. This is done by checking your blood sugar (glucose) after you have not eaten for a while (fasting). You may have this done every 1-3 years.  Bone density scan. This is done to screen for osteoporosis. You may have this done starting at age 74.  Mammogram. This may be done every 1-2  years. Talk to your health care provider about how often you should have regular mammograms. Talk with your health care provider about your test results, treatment options, and if necessary, the need for more tests. Vaccines  Your health care provider may recommend certain vaccines, such as:  Influenza vaccine. This is  recommended every year.  Tetanus, diphtheria, and acellular pertussis (Tdap, Td) vaccine. You may need a Td booster every 10 years.  Zoster vaccine. You may need this after age 49.  Pneumococcal 13-valent conjugate (PCV13) vaccine. One dose is recommended after age 24.  Pneumococcal polysaccharide (PPSV23) vaccine. One dose is recommended after age 87. Talk to your health care provider about which screenings and vaccines you need and how often you need them. This information is not intended to replace advice given to you by your health care provider. Make sure you discuss any questions you have with your health care provider. Document Released: 09/22/2015 Document Revised: 05/15/2016 Document Reviewed: 06/27/2015 Elsevier Interactive Patient Education  2017 [place] Prevention in the Home Falls can cause injuries. They can happen to people of all ages. There are many things you can do to make your home safe and to help prevent falls. What can I do on the outside of my home?  Regularly fix the edges of walkways and driveways and fix any cracks.  Remove anything that might make you trip as you walk through a door, such as a raised step or threshold.  Trim any bushes or trees on the path to your home.  Use bright outdoor lighting.  Clear any walking paths of anything that might make someone trip, such as rocks or tools.  Regularly check to see if handrails are loose or broken. Make sure that both sides of any steps have handrails.  Any raised decks and porches should have guardrails on the edges.  Have any leaves, snow, or ice cleared regularly.  Use sand or salt on walking paths during winter.  Clean up any spills in your garage right away. This includes oil or grease spills. What can I do in the bathroom?  Use night lights.  Install grab bars by the toilet and in the tub and shower. Do not use towel bars as grab bars.  Use non-skid mats or decals in the tub or  shower.  If you need to sit down in the shower, use a plastic, non-slip stool.  Keep the floor dry. Clean up any water that spills on the floor as soon as it happens.  Remove soap buildup in the tub or shower regularly.  Attach bath mats securely with double-sided non-slip rug tape.  Do not have throw rugs and other things on the floor that can make you trip. What can I do in the bedroom?  Use night lights.  Make sure that you have a light by your bed that is easy to reach.  Do not use any sheets or blankets that are too big for your bed. They should not hang down onto the floor.  Have a firm chair that has side arms. You can use this for support while you get dressed.  Do not have throw rugs and other things on the floor that can make you trip. What can I do in the kitchen?  Clean up any spills right away.  Avoid walking on wet floors.  Keep items that you use a lot in easy-to-reach places.  If you need to reach something above you, use a strong step  stool that has a grab bar.  Keep electrical cords out of the way.  Do not use floor polish or wax that makes floors slippery. If you must use wax, use non-skid floor wax.  Do not have throw rugs and other things on the floor that can make you trip. What can I do with my stairs?  Do not leave any items on the stairs.  Make sure that there are handrails on both sides of the stairs and use them. Fix handrails that are broken or loose. Make sure that handrails are as long as the stairways.  Check any carpeting to make sure that it is firmly attached to the stairs. Fix any carpet that is loose or worn.  Avoid having throw rugs at the top or bottom of the stairs. If you do have throw rugs, attach them to the floor with carpet tape.  Make sure that you have a light switch at the top of the stairs and the bottom of the stairs. If you do not have them, ask someone to add them for you. What else can I do to help prevent  falls?  Wear shoes that:  Do not have high heels.  Have rubber bottoms.  Are comfortable and fit you well.  Are closed at the toe. Do not wear sandals.  If you use a stepladder:  Make sure that it is fully opened. Do not climb a closed stepladder.  Make sure that both sides of the stepladder are locked into place.  Ask someone to hold it for you, if possible.  Clearly mark and make sure that you can see:  Any grab bars or handrails.  First and last steps.  Where the edge of each step is.  Use tools that help you move around (mobility aids) if they are needed. These include:  Canes.  Walkers.  Scooters.  Crutches.  Turn on the lights when you go into a dark area. Replace any light bulbs as soon as they burn out.  Set up your furniture so you have a clear path. Avoid moving your furniture around.  If any of your floors are uneven, fix them.  If there are any pets around you, be aware of where they are.  Review your medicines with your doctor. Some medicines can make you feel dizzy. This can increase your chance of falling. Ask your doctor what other things that you can do to help prevent falls. This information is not intended to replace advice given to you by your health care provider. Make sure you discuss any questions you have with your health care provider. Document Released: 06/22/2009 Document Revised: 02/01/2016 Document Reviewed: 09/30/2014 Elsevier Interactive Patient Education  2017 [employer].

## 2018-04-20 ENCOUNTER — Other Ambulatory Visit: Payer: Self-pay | Admitting: Unknown Physician Specialty

## 2018-04-20 DIAGNOSIS — R42 Dizziness and giddiness: Secondary | ICD-10-CM

## 2018-04-22 ENCOUNTER — Other Ambulatory Visit: Payer: Self-pay | Admitting: Family Medicine

## 2018-04-22 DIAGNOSIS — E782 Mixed hyperlipidemia: Secondary | ICD-10-CM

## 2018-04-22 MED ORDER — ROSUVASTATIN CALCIUM 10 MG PO TABS: 10.0000 mg | ORAL_TABLET | Freq: Every day | ORAL | 1 refills | Status: DC

## 2018-04-22 NOTE — Progress Notes (Signed)
Start crestor and coq-10 Recheck lipids in 6 weeks

## 2018-04-23 ENCOUNTER — Ambulatory Visit
Admission: RE | Admit: 2018-04-23 | Discharge: 2018-04-23 | Disposition: A | Discharge status: Home / Self Care | Payer: PPO | Source: Ambulatory Visit | Attending: Family Medicine | Admitting: Family Medicine

## 2018-04-23 DIAGNOSIS — E041 Nontoxic single thyroid nodule: Secondary | ICD-10-CM

## 2018-04-23 DIAGNOSIS — E010 Iodine-deficiency related diffuse (endemic) goiter: Secondary | ICD-10-CM | POA: Insufficient documentation

## 2018-04-23 DIAGNOSIS — E042 Nontoxic multinodular goiter: Secondary | ICD-10-CM | POA: Diagnosis not present

## 2018-04-24 DIAGNOSIS — M810 Age-related osteoporosis without current pathological fracture: Secondary | ICD-10-CM | POA: Diagnosis not present

## 2018-04-27 ENCOUNTER — Other Ambulatory Visit: Payer: Self-pay | Admitting: Family Medicine

## 2018-04-28 ENCOUNTER — Other Ambulatory Visit: Payer: Self-pay | Admitting: Family Medicine

## 2018-04-28 ENCOUNTER — Encounter: Payer: Self-pay | Admitting: Family Medicine

## 2018-04-28 DIAGNOSIS — E041 Nontoxic single thyroid nodule: Secondary | ICD-10-CM

## 2018-04-28 DIAGNOSIS — E042 Nontoxic multinodular goiter: Secondary | ICD-10-CM | POA: Diagnosis not present

## 2018-04-28 NOTE — Assessment & Plan Note (Signed)
Refer again to endo

## 2018-04-28 NOTE — Progress Notes (Signed)
Refer to endo 

## 2018-04-29 ENCOUNTER — Other Ambulatory Visit
Admission: RE | Admit: 2018-04-29 | Discharge: 2018-04-29 | Disposition: A | Discharge status: Home / Self Care | Payer: PPO | Source: Ambulatory Visit | Attending: Unknown Physician Specialty | Admitting: Unknown Physician Specialty

## 2018-04-29 ENCOUNTER — Ambulatory Visit
Admission: RE | Admit: 2018-04-29 | Discharge: 2018-04-29 | Disposition: A | Discharge status: Home / Self Care | Payer: PPO | Source: Ambulatory Visit | Attending: Unknown Physician Specialty | Admitting: Unknown Physician Specialty

## 2018-04-29 DIAGNOSIS — I6782 Cerebral ischemia: Secondary | ICD-10-CM | POA: Insufficient documentation

## 2018-04-29 DIAGNOSIS — R42 Dizziness and giddiness: Secondary | ICD-10-CM | POA: Diagnosis not present

## 2018-04-29 LAB — CREATININE, SERUM
Creatinine, Ser: 0.8 mg/dL (ref 0.44–1.00)
GFR calc Af Amer: >60 mL/min (ref >60)
GFR calc non Af Amer: >60 mL/min (ref >60)

## 2018-04-29 MED ORDER — GADOBENATE DIMEGLUMINE 529 MG/ML IV SOLN
15.0000 mL | Freq: Once | INTRAVENOUS | Status: AC | PRN
  Administered 2018-04-29: 12 mL via INTRAVENOUS

## 2018-05-04 DIAGNOSIS — E042 Nontoxic multinodular goiter: Secondary | ICD-10-CM | POA: Diagnosis not present

## 2018-05-04 DIAGNOSIS — G4733 Obstructive sleep apnea (adult) (pediatric): Secondary | ICD-10-CM | POA: Diagnosis not present

## 2018-05-05 DIAGNOSIS — E042 Nontoxic multinodular goiter: Secondary | ICD-10-CM | POA: Diagnosis not present

## 2018-05-11 DIAGNOSIS — H524 Presbyopia: Secondary | ICD-10-CM | POA: Diagnosis not present

## 2018-05-11 DIAGNOSIS — H2513 Age-related nuclear cataract, bilateral: Secondary | ICD-10-CM | POA: Diagnosis not present

## 2018-05-11 DIAGNOSIS — H43811 Vitreous degeneration, right eye: Secondary | ICD-10-CM | POA: Diagnosis not present

## 2018-05-11 DIAGNOSIS — H04123 Dry eye syndrome of bilateral lacrimal glands: Secondary | ICD-10-CM | POA: Diagnosis not present

## 2018-05-18 ENCOUNTER — Encounter: Payer: Self-pay | Admitting: Family Medicine

## 2018-05-18 NOTE — Telephone Encounter (Signed)
Please update health maintenance for Shingrix

## 2018-05-21 ENCOUNTER — Ambulatory Visit (INDEPENDENT_AMBULATORY_CARE_PROVIDER_SITE_OTHER): Payer: PPO

## 2018-05-21 DIAGNOSIS — Z23 Encounter for immunization: Secondary | ICD-10-CM | POA: Diagnosis not present

## 2018-06-04 DIAGNOSIS — G4733 Obstructive sleep apnea (adult) (pediatric): Secondary | ICD-10-CM | POA: Diagnosis not present

## 2018-06-25 ENCOUNTER — Other Ambulatory Visit: Payer: Self-pay

## 2018-06-25 ENCOUNTER — Telehealth: Payer: Self-pay | Admitting: Family Medicine

## 2018-06-25 DIAGNOSIS — E782 Mixed hyperlipidemia: Secondary | ICD-10-CM

## 2018-06-25 LAB — LIPID PANEL
CHOL/HDL RATIO: 2 (calc) (ref <5.0)
Cholesterol: 157 mg/dL (ref <200)
HDL: 77 mg/dL (ref >50)
LDL Cholesterol (Calc): 65 mg/dL (calc)
Non-HDL Cholesterol (Calc): 80 mg/dL (calc) (ref <130)
Triglycerides: 69 mg/dL (ref <150)

## 2018-06-25 MED ORDER — ROSUVASTATIN CALCIUM 10 MG PO TABS: 10.0000 mg | ORAL_TABLET | Freq: Every day | ORAL | 0 refills | Status: DC

## 2018-06-25 NOTE — Telephone Encounter (Signed)
Pt.notified

## 2018-06-25 NOTE — Telephone Encounter (Signed)
Copied from CRM [id number]. Topic: General - Other >> Jun 25, 2018 12:35 PM Ronney Lion A wrote: Medication: rosuvastatin (CRESTOR) 10 MG tablet   Has the patient contacted their pharmacy? Yes (pt would like to continue taking this medication)  Preferred Pharmacy (with phone number or street name): [id number] [place],  - [address] AT [facility] OF [address]  [phone] (Phone) [phone] (Fax)    Agent: Please be advised that RX refills may take up to 3 business days. We ask that you follow-up with your pharmacy.

## 2018-06-25 NOTE — Telephone Encounter (Signed)
Please see lab result note from August 9th and the outstanding lipid panel to be drawn Ask her to come in for those labs soon I'll send in one week of medicine to allow her more time to come get bloodwork Thank you

## 2018-06-26 ENCOUNTER — Other Ambulatory Visit: Payer: Self-pay | Admitting: Family Medicine

## 2018-06-26 MED ORDER — ROSUVASTATIN CALCIUM 10 MG PO TABS: 10.0000 mg | ORAL_TABLET | Freq: Every day | ORAL | 1 refills | Status: DC

## 2018-06-26 NOTE — Progress Notes (Signed)
Refills of statin sent 

## 2018-07-04 DIAGNOSIS — G4733 Obstructive sleep apnea (adult) (pediatric): Secondary | ICD-10-CM | POA: Diagnosis not present

## 2018-07-10 DIAGNOSIS — G4733 Obstructive sleep apnea (adult) (pediatric): Secondary | ICD-10-CM | POA: Diagnosis not present

## 2018-07-24 DIAGNOSIS — H2513 Age-related nuclear cataract, bilateral: Secondary | ICD-10-CM | POA: Diagnosis not present

## 2018-08-04 DIAGNOSIS — G4733 Obstructive sleep apnea (adult) (pediatric): Secondary | ICD-10-CM | POA: Diagnosis not present

## 2018-09-03 DIAGNOSIS — G4733 Obstructive sleep apnea (adult) (pediatric): Secondary | ICD-10-CM | POA: Diagnosis not present

## 2018-09-07 ENCOUNTER — Other Ambulatory Visit: Payer: Self-pay | Admitting: Family Medicine

## 2018-09-07 NOTE — Telephone Encounter (Signed)
Copied from CRM [id number]. Topic: Quick Communication - Rx Refill/Question >> Sep 07, 2018  9:36 AM Gloriann LoanPayne, Kathy Baldwin wrote: Medication: ranitidine (ZANTAC) 300 MG tablet  rosuvastatin (CRESTOR) 10 MG tablet  Has the patient contacted their pharmacy? Yes.   (Agent: If no, request that the patient contact the pharmacy for the refill.) (Agent: If yes, when and what did the pharmacy advise?) she called today told her to call provider  Preferred Pharmacy (with phone number or street name):     [id number] [address]- [place], [place] - [address] [facility] [phone] (Phone) [phone] (Fax)    Agent: Please be advised that RX refills may take up to 3 business days. We ask that you follow-up with your pharmacy.

## 2018-09-08 MED ORDER — RANITIDINE HCL 300 MG PO TABS: ORAL_TABLET | ORAL | 5 refills | Status: DC

## 2018-09-08 NOTE — Telephone Encounter (Signed)
I just prescribed her ranitidine yesterday and received another request today; I hit send before I realized it was a duplicate in her med list; I deleted that one  I prescribed her a 6 month supply of rosuvastatin on 06/26/18 Please resolve the rosuvastatin with the pharmacy

## 2018-09-09 HISTORY — PX: CYST EXCISION: SHX5701

## 2018-09-23 ENCOUNTER — Other Ambulatory Visit: Payer: Self-pay

## 2018-09-23 ENCOUNTER — Encounter: Payer: Self-pay | Admitting: *Deleted

## 2018-09-23 DIAGNOSIS — H2512 Age-related nuclear cataract, left eye: Secondary | ICD-10-CM | POA: Diagnosis not present

## 2018-09-23 NOTE — Anesthesia Preprocedure Evaluation (Addendum)
Anesthesia Evaluation  Patient identified by MRN, date of birth, ID band Patient awake    Reviewed: Allergy & Precautions, NPO status , Patient's Chart, lab work & pertinent test results  History of Anesthesia Complications Negative for: history of anesthetic complications  Airway Mallampati: I   Neck ROM: Full    Dental  (+)    Pulmonary sleep apnea and Continuous Positive Airway Pressure Ventilation ,    Pulmonary exam normal breath sounds clear to auscultation       Cardiovascular hypertension, Normal cardiovascular exam Rhythm:Regular Rate:Normal     Neuro/Psych Vertigo     GI/Hepatic GERD  ,  Endo/Other  negative endocrine ROS  Renal/GU negative Renal ROS     Musculoskeletal Vitamin D deficiency   Abdominal   Peds  Hematology negative hematology ROS (+)   Anesthesia Other Findings   Reproductive/Obstetrics                            Anesthesia Physical Anesthesia Plan  ASA: II  Anesthesia Plan: MAC   Post-op Pain Management:    Induction: Intravenous  PONV Risk Score and Plan: 2 and TIVA and Midazolam  Airway Management Planned: Natural Airway  Additional Equipment:   Intra-op Plan:   Post-operative Plan:   Informed Consent: I have reviewed the patients History and Physical, chart, labs and discussed the procedure including the risks, benefits and alternatives for the proposed anesthesia with the patient or authorized representative who has indicated his/her understanding and acceptance.       Plan Discussed with: CRNA  Anesthesia Plan Comments: (Discuss suspension of DNR with patient.)       Anesthesia Quick Evaluation

## 2018-09-24 NOTE — Discharge Instructions (Signed)

## 2018-09-30 ENCOUNTER — Ambulatory Visit
Admission: RE | Admit: 2018-09-30 | Discharge: 2018-09-30 | Disposition: A | Discharge status: Home / Self Care | Payer: PPO | Attending: Ophthalmology | Admitting: Ophthalmology

## 2018-09-30 ENCOUNTER — Ambulatory Visit: Payer: PPO | Admitting: Anesthesiology

## 2018-09-30 ENCOUNTER — Encounter
Admission: RE | Disposition: A | Discharge status: Home / Self Care | Payer: Self-pay | Source: Home / Self Care | Attending: Ophthalmology

## 2018-09-30 DIAGNOSIS — H2512 Age-related nuclear cataract, left eye: Secondary | ICD-10-CM | POA: Insufficient documentation

## 2018-09-30 DIAGNOSIS — G4730 Sleep apnea, unspecified: Secondary | ICD-10-CM | POA: Diagnosis not present

## 2018-09-30 DIAGNOSIS — M810 Age-related osteoporosis without current pathological fracture: Secondary | ICD-10-CM | POA: Insufficient documentation

## 2018-09-30 DIAGNOSIS — I10 Essential (primary) hypertension: Secondary | ICD-10-CM | POA: Diagnosis not present

## 2018-09-30 DIAGNOSIS — H25812 Combined forms of age-related cataract, left eye: Secondary | ICD-10-CM | POA: Diagnosis not present

## 2018-09-30 HISTORY — PX: CATARACT EXTRACTION W/PHACO: SHX586

## 2018-09-30 SURGERY — PHACOEMULSIFICATION, CATARACT, WITH IOL INSERTION
Anesthesia: Monitor Anesthesia Care | Laterality: Left

## 2018-09-30 MED ORDER — MOXIFLOXACIN HCL 0.5 % OP SOLN
1.0000 [drp] | OPHTHALMIC | Status: DC | PRN
  Administered 2018-09-30 (×3): 1 [drp] via OPHTHALMIC

## 2018-09-30 MED ORDER — ACETAMINOPHEN 160 MG/5ML PO SOLN: 325.0000 mg | ORAL | Status: DC | PRN

## 2018-09-30 MED ORDER — LIDOCAINE HCL (PF) 2 % IJ SOLN
INTRAOCULAR | Status: DC | PRN
  Administered 2018-09-30: 1 mL

## 2018-09-30 MED ORDER — LACTATED RINGERS IV SOLN: INTRAVENOUS | Status: DC

## 2018-09-30 MED ORDER — ACETAMINOPHEN 325 MG PO TABS: 650.0000 mg | ORAL_TABLET | Freq: Once | ORAL | Status: DC | PRN

## 2018-09-30 MED ORDER — FENTANYL CITRATE (PF) 100 MCG/2ML IJ SOLN
INTRAMUSCULAR | Status: DC | PRN
  Administered 2018-09-30: 50 ug via INTRAVENOUS

## 2018-09-30 MED ORDER — BRIMONIDINE TARTRATE-TIMOLOL 0.2-0.5 % OP SOLN
OPHTHALMIC | Status: DC | PRN
  Administered 2018-09-30: 1 [drp] via OPHTHALMIC

## 2018-09-30 MED ORDER — NA HYALUR & NA CHOND-NA HYALUR 0.4-0.35 ML IO KIT
PACK | INTRAOCULAR | Status: DC | PRN
  Administered 2018-09-30: 1 mL via INTRAOCULAR

## 2018-09-30 MED ORDER — ONDANSETRON HCL 4 MG/2ML IJ SOLN: 4.0000 mg | Freq: Once | INTRAMUSCULAR | Status: DC | PRN

## 2018-09-30 MED ORDER — TETRACAINE HCL 0.5 % OP SOLN
1.0000 [drp] | OPHTHALMIC | Status: DC | PRN
  Administered 2018-09-30 (×2): 1 [drp] via OPHTHALMIC

## 2018-09-30 MED ORDER — ARMC OPHTHALMIC DILATING DROPS
1.0000 "application " | OPHTHALMIC | Status: DC | PRN
  Administered 2018-09-30 (×3): 1 via OPHTHALMIC

## 2018-09-30 MED ORDER — MIDAZOLAM HCL 2 MG/2ML IJ SOLN
INTRAMUSCULAR | Status: DC | PRN
  Administered 2018-09-30 (×2): 0.5 mg via INTRAVENOUS
  Administered 2018-09-30: 1 mg via INTRAVENOUS

## 2018-09-30 MED ORDER — CEFUROXIME OPHTHALMIC INJECTION 1 MG/0.1 ML
INJECTION | OPHTHALMIC | Status: DC | PRN
  Administered 2018-09-30: .2 mL via INTRACAMERAL

## 2018-09-30 MED ORDER — EPINEPHRINE PF 1 MG/ML IJ SOLN
INTRAOCULAR | Status: DC | PRN
  Administered 2018-09-30: 67 mL via OPHTHALMIC

## 2018-09-30 SURGICAL SUPPLY — 27 items
CANNULA ANT/CHMB 27G (MISCELLANEOUS) ×1 IMPLANT
CANNULA ANT/CHMB 27GA (MISCELLANEOUS) ×2 IMPLANT
GLOVE SURG LX 7.5 STRW (GLOVE) ×1
GLOVE SURG LX STRL 7.5 STRW (GLOVE) ×1 IMPLANT
GLOVE SURG TRIUMPH 8.0 PF LTX (GLOVE) ×2 IMPLANT
GOWN STRL REUS W/ TWL LRG LVL3 (GOWN DISPOSABLE) ×2 IMPLANT
GOWN STRL REUS W/TWL LRG LVL3 (GOWN DISPOSABLE) ×2
LENS IOL ACRSF IQ ULTRA 18.0 (Intraocular Lens) IMPLANT
LENS IOL ACRYSOF IQ 18.0 (Intraocular Lens) ×2 IMPLANT
MARKER SKIN DUAL TIP RULER LAB (MISCELLANEOUS) ×2 IMPLANT
NDL FILTER BLUNT 18X1 1/2 (NEEDLE) ×1 IMPLANT
NDL RETROBULBAR .5 NSTRL (NEEDLE) IMPLANT
NEEDLE FILTER BLUNT 18X 1/2SAF (NEEDLE) ×1
NEEDLE FILTER BLUNT 18X1 1/2 (NEEDLE) ×1 IMPLANT
PACK CATARACT BRASINGTON (MISCELLANEOUS) ×2 IMPLANT
PACK EYE AFTER SURG (MISCELLANEOUS) ×2 IMPLANT
PACK OPTHALMIC (MISCELLANEOUS) ×2 IMPLANT
RING MALYGIN 7.0 (MISCELLANEOUS) IMPLANT
SUT ETHILON 10-0 CS-B-6CS-B-6 (SUTURE)
SUT VICRYL  9 0 (SUTURE)
SUT VICRYL 9 0 (SUTURE) IMPLANT
SUTURE EHLN 10-0 CS-B-6CS-B-6 (SUTURE) IMPLANT
SYR 3ML LL SCALE MARK (SYRINGE) ×2 IMPLANT
SYR 5ML LL (SYRINGE) ×2 IMPLANT
SYR TB 1ML LUER SLIP (SYRINGE) ×2 IMPLANT
WATER STERILE IRR 500ML POUR (IV SOLUTION) ×2 IMPLANT
WIPE NON LINTING 3.25X3.25 (MISCELLANEOUS) ×2 IMPLANT

## 2018-09-30 NOTE — Op Note (Signed)
OPERATIVE NOTE  Kathy Baldwin [id number] 09/30/2018   PREOPERATIVE DIAGNOSIS:  Nuclear sclerotic cataract left eye. H25.12   POSTOPERATIVE DIAGNOSIS:    Nuclear sclerotic cataract left eye.     PROCEDURE:  Phacoemusification with posterior chamber intraocular lens placement of the left eye   LENS:   Implant Name Type Inv. Item Serial No. Manufacturer Lot No. LRB No. Used  LENS IOL ACRYSOF IQ 18.0 - [id number] Intraocular Lens LENS IOL ACRYSOF IQ 18.0 [id number] ALCON  Left 1        ULTRASOUND TIME: 16  % of 0 minutes 49 seconds, CDE 8.0  SURGEON:  Deirdre Evener, MD   ANESTHESIA:  Topical with tetracaine drops and 2% Xylocaine jelly, augmented with 1% preservative-free intracameral lidocaine.    COMPLICATIONS:  None.   DESCRIPTION OF PROCEDURE:  The patient was identified in the holding room and transported to the operating room and placed in the supine position under the operating microscope.  The left eye was identified as the operative eye and it was prepped and draped in the usual sterile ophthalmic fashion.   A 1 millimeter clear-corneal paracentesis was made at the 1:30 position.  0.5 ml of preservative-free 1% lidocaine was injected into the anterior chamber.  The anterior chamber was filled with Viscoat viscoelastic.  A 2.4 millimeter keratome was used to make a near-clear corneal incision at the 10:30 position.  .  A curvilinear capsulorrhexis was made with a cystotome and capsulorrhexis forceps.  Balanced salt solution was used to hydrodissect and hydrodelineate the nucleus.   Phacoemulsification was then used in stop and chop fashion to remove the lens nucleus and epinucleus.  The remaining cortex was then removed using the irrigation and aspiration handpiece. Provisc was then placed into the capsular bag to distend it for lens placement.  A lens was then injected into the capsular bag.  The remaining viscoelastic was aspirated.   Wounds were hydrated with  balanced salt solution.  The anterior chamber was inflated to a physiologic pressure with balanced salt solution.  No wound leaks were noted. Cefuroxime 0.1 ml of a 10mg /ml solution was injected into the anterior chamber for a dose of 1 mg of intracameral antibiotic at the completion of the case.   Timolol and Brimonidine drops were applied to the eye.  The patient was taken to the recovery room in stable condition without complications of anesthesia or surgery.  [name] 09/30/2018, 9:03 AM

## 2018-09-30 NOTE — Transfer of Care (Signed)
Immediate Anesthesia Transfer of Care Note  Patient: Kathy Baldwin  Procedure(s) Performed: CATARACT EXTRACTION PHACO AND INTRAOCULAR LENS PLACEMENT (IOC)  LEFT (Left )  Patient Location: PACU  Anesthesia Type: MAC  Level of Consciousness: awake, alert  and patient cooperative  Airway and Oxygen Therapy: Patient Spontanous Breathing and Patient connected to supplemental oxygen  Post-op Assessment: Post-op Vital signs reviewed, Patient's Cardiovascular Status Stable, Respiratory Function Stable, Patent Airway and No signs of Nausea or vomiting  Post-op Vital Signs: Reviewed and stable  Complications: No apparent anesthesia complications

## 2018-09-30 NOTE — Anesthesia Procedure Notes (Signed)
Procedure Name: MAC Performed by: Izetta Dakin, CRNA Pre-anesthesia Checklist: Timeout performed, Patient being monitored, Suction available, Emergency Drugs available and Patient identified Patient Re-evaluated:Patient Re-evaluated prior to induction Oxygen Delivery Method: Nasal cannula

## 2018-09-30 NOTE — H&P (Signed)

## 2018-09-30 NOTE — Anesthesia Postprocedure Evaluation (Signed)
Anesthesia Post Note  Patient: Kathy Baldwin  Procedure(s) Performed: CATARACT EXTRACTION PHACO AND INTRAOCULAR LENS PLACEMENT (IOC)  LEFT (Left )  Patient location during evaluation: PACU Anesthesia Type: MAC Level of consciousness: awake and alert, oriented and patient cooperative Pain management: pain level controlled Vital Signs Assessment: post-procedure vital signs reviewed and stable Respiratory status: spontaneous breathing, nonlabored ventilation and respiratory function stable Cardiovascular status: blood pressure returned to baseline and stable Postop Assessment: adequate PO intake Anesthetic complications: no    Darrin Nipper

## 2018-10-01 ENCOUNTER — Encounter: Payer: Self-pay | Admitting: Ophthalmology

## 2018-10-04 DIAGNOSIS — G4733 Obstructive sleep apnea (adult) (pediatric): Secondary | ICD-10-CM | POA: Diagnosis not present

## 2018-10-08 ENCOUNTER — Ambulatory Visit: Payer: PPO | Admitting: Family Medicine

## 2018-10-08 ENCOUNTER — Ambulatory Visit
Admission: RE | Admit: 2018-10-08 | Discharge: 2018-10-08 | Disposition: A | Discharge status: Home / Self Care | Payer: PPO | Source: Ambulatory Visit | Attending: Family Medicine | Admitting: Family Medicine

## 2018-10-08 ENCOUNTER — Telehealth: Payer: Self-pay | Admitting: Family Medicine

## 2018-10-08 ENCOUNTER — Telehealth: Payer: Self-pay

## 2018-10-08 ENCOUNTER — Ambulatory Visit (INDEPENDENT_AMBULATORY_CARE_PROVIDER_SITE_OTHER): Payer: PPO | Admitting: Family Medicine

## 2018-10-08 ENCOUNTER — Encounter: Payer: Self-pay | Admitting: Family Medicine

## 2018-10-08 VITALS — BP 138/80 | HR 69 | Temp 98.6°F | Resp 12 | Ht 63.0 in | Wt 139.2 lb

## 2018-10-08 DIAGNOSIS — Z8673 Personal history of transient ischemic attack (TIA), and cerebral infarction without residual deficits: Secondary | ICD-10-CM | POA: Insufficient documentation

## 2018-10-08 DIAGNOSIS — E782 Mixed hyperlipidemia: Secondary | ICD-10-CM

## 2018-10-08 DIAGNOSIS — I679 Cerebrovascular disease, unspecified: Secondary | ICD-10-CM

## 2018-10-08 DIAGNOSIS — R600 Localized edema: Secondary | ICD-10-CM | POA: Diagnosis not present

## 2018-10-08 DIAGNOSIS — I6523 Occlusion and stenosis of bilateral carotid arteries: Secondary | ICD-10-CM | POA: Insufficient documentation

## 2018-10-08 DIAGNOSIS — R413 Other amnesia: Secondary | ICD-10-CM | POA: Diagnosis not present

## 2018-10-08 DIAGNOSIS — I639 Cerebral infarction, unspecified: Secondary | ICD-10-CM | POA: Diagnosis not present

## 2018-10-08 MED ORDER — ASPIRIN EC 325 MG PO TBEC: 325.0000 mg | DELAYED_RELEASE_TABLET | Freq: Every day | ORAL | Status: DC

## 2018-10-08 NOTE — Patient Instructions (Addendum)
We'll have you go directly to the hospital (or wherever staff directs you) for the imaging test I'll order an MRI of the left lower leg if nothing definitive shows on the first scan Check with the pharmacy about your ranitidine to see if it's included in the recall If you develop acute shortness of breath and/or chest pain, call 911 immediately We'll have you see the neurologist and the vascular surgeon Try to limit saturated fats in your diet (bologna, hot dogs, barbeque, cheeseburgers, hamburgers, steak, bacon, sausage, cheese, etc.) and get more fresh fruits, vegetables, and whole grains

## 2018-10-08 NOTE — Assessment & Plan Note (Addendum)
Noted on recent imaging; reviewed with patient and husband here during visit; refer to neuro; on aspirin and statin

## 2018-10-08 NOTE — Telephone Encounter (Signed)
See other phone note; discussed and documented in the note started for aspirin therapy

## 2018-10-08 NOTE — Telephone Encounter (Signed)
Per [facility], Dr. Inez Pilgrim is okay with the aspirin increase to 325mg  and it does NOT need to be stopped before surgery.

## 2018-10-08 NOTE — Progress Notes (Signed)
BP 138/80   Pulse 69   Temp 98.6 F (37 C) (Oral)   Resp 12   Ht 5\' 3"  (1.6 m)   Wt 139 lb 3.2 oz (63.1 kg)   SpO2 99%   BMI 24.66 kg/m    Subjective:    Patient ID: Kathy Baldwin, female    DOB: 10/23/1945, 73 y.o.   MRN: [id number]  HPI: Kathy Baldwin is a 73 y.o. female  Chief Complaint  Patient presents with  . Leg Pain    left calf onset 2 days, with swelling    HPI Patient is here for an acute visit Left calf pain, started two days ago No long trips, no airplane flights Tingling in the left foot and toes No injuries to the leg Sister had a blood clot, not sure where, not fatal Using topical estogen just every other night, just pea-sized No fevers The first night she tried a Scientist, clinical (histocompatibility and immunogenetics)massager which helped some Ice pack helped some No SHOB, no chest pain Swelled up last night like half of an egg, small egg If it starts to hurt, walking relieves it Going to bed makes it unbearable She was instructed to go to the ER but did not go Sitting and kneeling on the concrete floor the last few days, towels under her; cleaning the bottom shelf and painting in the pantry; day before yesterday, the swelling started the same night; no swelling before that Just had cataract extraction, IOL placed OS Jan 22nd Planning to have OD done in a few weeks She is on aspirin, but she did not take it just one day (surgery date), started back Dr. Inez PilgrimBrasington is eye surgery  She is having a lot of trouble with memory, small amount of cognitive problems Doesn't think clearly; going on for 4-6 months Very forgetful If they don't write down dates on calendar, "forget it, we ain't going to be there" He will tell her something, and after 20 minutes it's gone Mother had some, very little in her 6790's Patient gets easily sidetracked  ENT did a brain MRI done not long ago: CLINICAL DATA:  Recurrent episodes of vertigo over the last 2 years.  EXAM: MRI HEAD WITHOUT AND WITH  CONTRAST  TECHNIQUE: Multiplanar, multiecho pulse sequences of the brain and surrounding structures were obtained without and with intravenous contrast.  CONTRAST:  12mL MULTIHANCE GADOBENATE DIMEGLUMINE 529 MG/ML IV SOLN  COMPARISON:  None.  FINDINGS: Brain: Diffusion imaging does not show any acute or subacute infarction. There are mild chronic small-vessel changes of the pons. There are several old small vessel cerebellar infarctions. Cerebral hemispheres show moderate chronic small-vessel ischemic changes of the deep and subcortical white matter. No cortical or large vessel territory infarction. No mass lesion, hemorrhage, hydrocephalus or extra-axial collection. CP angle regions are normal. Seventh and eighth nerve complexes are normal. No abnormal enhancement occurs.  Vascular: Major vessels at the base of the brain show flow.  Skull and upper cervical spine: Negative  Sinuses/Orbits: Clear/normal. No fluid in the middle ears or mastoids.  Other: None  IMPRESSION: Chronic small-vessel ischemic changes affecting the brain as outlined above. No acute or subacute insult. No vestibular schwannoma or specific cause of vertigo.   Electronically Signed   By: Paulina FusiMark  Shogry M.D.   On: 04/29/2018 09:54  Dr. Tedd SiasSolum is managing thyroid nodules; biopsied, everything was fine says husband  Carotids done in August; less than 50% both sides, but that was before we knew about the strokes  LDL was 65 in October  [facility] 10/08/2018 04/17/2018 03/09/2018 10/29/2017 03/20/2017  Decreased Interest 0 0 0 0 0  Down, Depressed, Hopeless 0 0 0 0 0  PHQ - 2 Score 0 0 0 0 0  Altered sleeping 0 0 - - -  Tired, decreased energy 0 0 - - -  Change in appetite 0 0 - - -  Feeling bad or failure about yourself  0 0 - - -  Trouble concentrating 0 0 - - -  Moving slowly or fidgety/restless 0 0 - - -  Suicidal thoughts 0 0 - - -  PHQ-9 Score 0 0 - - -  Difficult doing  work/chores Not difficult at all Not difficult at all - - -   Fall Risk  10/08/2018 04/17/2018 03/09/2018 10/29/2017 03/20/2017  Falls in the past year? 1 No Yes No No  Comment - removing dead leaves from a fig tree and fell to the ground - - -  Number falls in past yr: 0 1 1 - -  Injury with Fall? 0 No No - -  Risk for fall due to : - Impaired vision;Other (Comment);Impaired balance/gait;History of fall(s) - - -  Risk for fall due to: Comment - wears eyeglasses; vertigo - - -  Follow up - Falls evaluation completed;Education provided;Falls prevention discussed - - -    Relevant past medical, surgical, family and social history reviewed Past Medical History:  Diagnosis Date  . Allergic rhinitis   . 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 Holly BodilyArtis 2010  . DNR no code (do not resuscitate) 03/20/2017  . GERD (gastroesophageal reflux disease)   . Hearing loss   . History of gallstones   . Hyperlipidemia   . Hypertension   . Hypertrophy of nasal turbinates   . Lymphocytosis   . Menopause age 73  . Multiple food allergies    chocolate and cabbage  . OSA on CPAP   . Osteopenia Oct. 2014  . Sleep apnea   . Vertigo   . Vitamin D deficiency disease    Past Surgical History:  Procedure Laterality Date  . BREAST CYST ASPIRATION Right    negative  . CARPAL TUNNEL RELEASE Right 2015  . CATARACT EXTRACTION W/PHACO Left 09/30/2018   Procedure: CATARACT EXTRACTION PHACO AND INTRAOCULAR LENS PLACEMENT (IOC)  LEFT;  Surgeon: Lockie MolaBrasington, Chadwick, MD;  [facility];  Service: Ophthalmology;  Laterality: Left;  sleep apnea  . CHOLECYSTECTOMY  15-20 years ago  . COLONOSCOPY  04/25/08  . DILATION AND CURETTAGE OF UTERUS  1982   s/p miscarriage   Family History  Problem Relation Age of Onset  . Healthy Mother   . Lung disease Father        black lung  . Heart disease Father   . Hyperlipidemia Father   . Hypertension Father   . Emphysema  Sister   . Healthy Sister   . Cancer Brother        unsure of type  . Asthma Daughter   . Heart disease Sister   . Thyroid disease Sister   . Stroke Maternal Grandmother   . Hypertension Maternal Grandmother   . Diabetes Maternal Grandmother   . Diabetes Sister   . Healthy Sister   . Breast cancer Neg Hx    Social History   Tobacco Use  . Smoking status: Never Smoker  . Smokeless tobacco: Never Used  .  Tobacco comment: smoking cessation materials not required  Substance Use Topics  . Alcohol use: Yes    Alcohol/week: 0.0 standard drinks    Comment: occasional-holidays  . Drug use: No     Interim medical history since last visit reviewed. Allergies and medications reviewed  Review of Systems Per HPI unless specifically indicated above     Objective:    BP 138/80   Pulse 69   Temp 98.6 F (37 C) (Oral)   Resp 12   Ht 5\' 3"  (1.6 m)   Wt 139 lb 3.2 oz (63.1 kg)   SpO2 99%   BMI 24.66 kg/m   Wt Readings from Last 3 Encounters:  10/08/18 139 lb 3.2 oz (63.1 kg)  09/30/18 139 lb (63 kg)  04/17/18 134 lb 12.8 oz (61.1 kg)    Physical Exam Constitutional:      Appearance: She is well-developed. She is not diaphoretic.  Eyes:     General: No scleral icterus. Cardiovascular:     Rate and Rhythm: Normal rate and regular rhythm.  Pulmonary:     Effort: Pulmonary effort is normal.     Breath sounds: Normal breath sounds.  Musculoskeletal:       Legs:     Comments: 8 x 6.5 cm palpable mass/swelling; most tender at inferior aspect  Skin:    Findings: No bruising, ecchymosis or erythema.     Comments: No visible signs of injury or trauma to the left leg  Neurological:     Mental Status: She is alert.     Comments: Neurologically intact over and distal to the lesion  Psychiatric:        Behavior: Behavior normal.    Results for orders placed or performed in visit on 06/25/18  Lipid panel  Result Value Ref Range   Cholesterol 157 <200 mg/dL   HDL 77 >16  mg/dL   Triglycerides 69 <109 mg/dL   LDL Cholesterol (Calc) 65 mg/dL (calc)   Total CHOL/HDL Ratio 2.0 <5.0 (calc)   Non-HDL Cholesterol (Calc) 80 <604 mg/dL (calc)      Assessment & Plan:   Problem List Items Addressed This Visit      Cardiovascular and Mediastinum   Small vessel disease, cerebrovascular    Noted on recent imaging; reviewed with patient and husband here during visit; refer to neuro; on aspirin and statin      Relevant Orders   Ambulatory referral to Vascular Surgery   Ambulatory referral to Neurology   COMPLETE METABOLIC PANEL WITH GFR   Lipid panel   Carotid atherosclerosis, bilateral    Refer to vascular specialist; goal LDL less than 70; currently on 81 mg aspirin; will find out from ophthalmologist if antiplatlet therapy can be increased (recent eye surgery)        Nervous and Auditory   Cerebellar infarction (HCC)    Noted on last MRI; discussed results, refer to neurologist; she has been on 81 mg aspirin; she recently had cataract surgery; phone call to Dr. Inez Pilgrim to find out if she can take 325 mg at this time, and if so, does he need her to hold that for X number of days prior to the next cataract surgery; awaiting word back      Relevant Orders   Ambulatory referral to Vascular Surgery   Ambulatory referral to Neurology   COMPLETE METABOLIC PANEL WITH GFR   Lipid panel     Other   Hyperlipidemia    With evidence of cerebellar strokes  on recent MRI which appear old; refer to vascular; refer to neurologist; discussed findings with patient and husband; will find out if ophthalmologist says it is okay to increase dose of aspirin since she recently had eye surgery       Other Visit Diagnoses    Leg edema, left    -  Primary   less likely DVT and more concerned about the anterolateral swelling; no bruising; will get stat US of LEFT lower ext; consider MRI for ant/lat aspect   Relevant Orders   US Venous Img Lower Unilateral Left (Completed)    Memory difficulties       will check labs today and refer to neurologist   Relevant Orders   Ambulatory referral to Vascular Surgery   Ambulatory referral to Neurology   TSH   CBC   VITAMIN D 25 Hydroxy (Vit-D Deficiency, Fractures)   Vitamin B12       Follow up plan: Return in about 4 weeks (around 11/05/2018) for follow-up visit with Dr. Sherie Don.  An after-visit summary was printed and given to the patient at check-out.  Please see the patient instructions which may contain other information and recommendations beyond what is mentioned above in the assessment and plan.  No orders of the defined types were placed in this encounter.   Orders Placed This Encounter  Procedures  . US Venous Img Lower Unilateral Left  . TSH  . CBC  . COMPLETE METABOLIC PANEL WITH GFR  . Lipid panel  . VITAMIN D 25 Hydroxy (Vit-D Deficiency, Fractures)  . Vitamin B12  . Ambulatory referral to Vascular Surgery  . Ambulatory referral to Neurology

## 2018-10-08 NOTE — Telephone Encounter (Signed)
Copied from CRM [id number]. Topic: General - Other >> Oct 08, 2018  3:06 PM Percival Spanish wrote:  Sharlet Salina with  Eye call to say that is ok to increase  aspirin due to they don't dc aspirin due to surgery  She said this message for [facility]

## 2018-10-08 NOTE — Assessment & Plan Note (Addendum)
Noted on last MRI; discussed results, refer to neurologist; she has been on 81 mg aspirin; she recently had cataract surgery; phone call to Dr. Inez Pilgrim to find out if she can take 325 mg at this time, and if so, does he need her to hold that for X number of days prior to the next cataract surgery; awaiting word back

## 2018-10-08 NOTE — Telephone Encounter (Signed)
Pt called in last night with leg pain, Dr. Sherie Don reviewed this morning and advised pt be seen in the ER this morning. Pt notified, stated her husband had just left for work but she was going to call him and see if he could come back home to take her.

## 2018-10-08 NOTE — Telephone Encounter (Signed)
Copied from CRM [id number]. Topic: Quick Communication - See Telephone Encounter >> Oct 08, 2018  4:49 PM Arlyss Gandyichardson, Mikalyn Hermida N, NT wrote: CRM for notification. See Telephone encounter for: 10/08/18. Pts husband awaiting call from Dr. Sherie DonLada to receive instructions on how to care for his wifes edema in legs when he helps her to bed tonight. Please advise.

## 2018-10-08 NOTE — Assessment & Plan Note (Signed)
Refer to vascular specialist; goal LDL less than 70; currently on 81 mg aspirin; will find out from ophthalmologist if antiplatlet therapy can be increased (recent eye surgery)

## 2018-10-08 NOTE — Assessment & Plan Note (Signed)
With evidence of cerebellar strokes on recent MRI which appear old; refer to vascular; refer to neurologist; discussed findings with patient and husband; will find out if ophthalmologist says it is okay to increase dose of aspirin since she recently had eye surgery

## 2018-10-08 NOTE — Telephone Encounter (Signed)
Thank you so much I called patient to discuss increasing aspirin dose I invited him to confirm with Dr. Inez Pilgrim that she is supposed to continue Ice, with protective cloth between temp and skin Tylenol Avoid additional anti-inflammatories since already on aspirin and over 65 He wants to wait on getting the MRI, mentioned $200-300 copay and would rather see what it does over the next 7-10 days I explained I am okay with waiting 7 days, but want them to contact me in one week with an update on how it's doing It is complex and septated and if not quickly resolving, I want MRI He agrees to contact me in one week with update and then get MRI if not improving Call with any other issues in the meantime We'll have her see vascular and neurology for the other issues

## 2018-10-08 NOTE — Telephone Encounter (Signed)
Please contact Dr. Inez Pilgrim, eye doctor Let him know that I would like to advance patient's aspirin therapy from 81 mg to 325 mg daily She recently had cataract surgery and is going to have another cataract surgery soon and we want to make sure that is okay with him Also, if she goes up to 325 mg, does she need to hold that prior to the next cataract surgery and if yes, then how many days does he want to stop it beforehand? Thank you

## 2018-10-08 NOTE — Telephone Encounter (Signed)
Left message with receptionist. She stated she would get the message to Dr. Inez Pilgrim and give [place] a call back with his response.

## 2018-10-09 ENCOUNTER — Ambulatory Visit: Payer: PPO | Admitting: Family Medicine

## 2018-10-09 DIAGNOSIS — H2511 Age-related nuclear cataract, right eye: Secondary | ICD-10-CM | POA: Diagnosis not present

## 2018-10-09 LAB — VITAMIN D 25 HYDROXY (VIT D DEFICIENCY, FRACTURES): Vit D, 25-Hydroxy: 50 ng/mL (ref 30–100)

## 2018-10-09 LAB — CBC
HCT: 39.8 % (ref 35.0–45.0)
HEMOGLOBIN: 13.5 g/dL (ref 11.7–15.5)
MCH: 30.6 pg (ref 27.0–33.0)
MCHC: 33.9 g/dL (ref 32.0–36.0)
MCV: 90.2 fL (ref 80.0–100.0)
MPV: 10 fL (ref 7.5–12.5)
Platelets: 216 10*3/uL (ref 140–400)
RBC: 4.41 10*6/uL (ref 3.80–5.10)
RDW: 12.5 % (ref 11.0–15.0)
WBC: 5.8 10*3/uL (ref 3.8–10.8)

## 2018-10-09 LAB — COMPLETE METABOLIC PANEL WITH GFR
AG Ratio: 1.4 (calc) (ref 1.0–2.5)
ALT: 17 U/L (ref 6–29)
AST: 28 U/L (ref 10–35)
Albumin: 4.2 g/dL (ref 3.6–5.1)
Alkaline phosphatase (APISO): 58 U/L (ref 33–130)
BUN: 15 mg/dL (ref 7–25)
CO2: 27 mmol/L (ref 20–32)
Calcium: 10.4 mg/dL (ref 8.6–10.4)
Chloride: 107 mmol/L (ref 98–110)
Creat: 0.78 mg/dL (ref 0.60–0.93)
GFR, Est African American: 87 mL/min/{1.73_m2} (ref >=60)
GFR, Est Non African American: 75 mL/min/{1.73_m2} (ref >=60)
Globulin: 2.9 g/dL (calc) (ref 1.9–3.7)
Glucose, Bld: 78 mg/dL (ref 65–139)
POTASSIUM: 4.3 mmol/L (ref 3.5–5.3)
Sodium: 144 mmol/L (ref 135–146)
Total Bilirubin: 1.2 mg/dL (ref 0.2–1.2)
Total Protein: 7.1 g/dL (ref 6.1–8.1)

## 2018-10-09 LAB — VITAMIN B12: Vitamin B-12: >2000 pg/mL — ABNORMAL HIGH (ref 200–1100)

## 2018-10-09 LAB — LIPID PANEL
Cholesterol: 159 mg/dL (ref <200)
HDL: 81 mg/dL (ref >50)
LDL Cholesterol (Calc): 66 mg/dL (calc)
Non-HDL Cholesterol (Calc): 78 mg/dL (calc) (ref <130)
Total CHOL/HDL Ratio: 2 (calc) (ref <5.0)
Triglycerides: 50 mg/dL (ref <150)

## 2018-10-09 LAB — TSH: TSH: 0.94 mIU/L (ref 0.40–4.50)

## 2018-10-16 ENCOUNTER — Telehealth: Payer: Self-pay | Admitting: Family Medicine

## 2018-10-16 DIAGNOSIS — G4733 Obstructive sleep apnea (adult) (pediatric): Secondary | ICD-10-CM

## 2018-10-16 DIAGNOSIS — Z9989 Dependence on other enabling machines and devices: Principal | ICD-10-CM

## 2018-10-16 NOTE — Addendum Note (Signed)
Addended by: Marcos EkeGRAVES, Karesa Maultsby C on: 10/16/2018 03:00 PM   Modules accepted: Orders

## 2018-10-16 NOTE — Telephone Encounter (Signed)
Copied from CRM [id number]. Topic: General - Other >> Oct 16, 2018 12:07 PM Leafy Ro wrote: Reason for CRM: Shanda Bumps with [facility] is calling and pt would like new rx for cpap supplies from  [facility] also needs face notes to be fax to [phone] and phone [phone]

## 2018-10-16 NOTE — Telephone Encounter (Signed)
Pt.notified

## 2018-10-16 NOTE — Telephone Encounter (Signed)
She'll need a visit for this soon Last sleep apnea dedicated visit was Oct 29, 2017 I believe they require documentation every year Thank you

## 2018-10-17 ENCOUNTER — Encounter: Payer: Self-pay | Admitting: Family Medicine

## 2018-10-19 ENCOUNTER — Ambulatory Visit (INDEPENDENT_AMBULATORY_CARE_PROVIDER_SITE_OTHER): Payer: PPO | Admitting: Family Medicine

## 2018-10-19 ENCOUNTER — Ambulatory Visit: Payer: Self-pay | Admitting: *Deleted

## 2018-10-19 ENCOUNTER — Encounter: Payer: Self-pay | Admitting: Family Medicine

## 2018-10-19 ENCOUNTER — Ambulatory Visit (HOSPITAL_COMMUNITY)
Admission: RE | Admit: 2018-10-19 | Discharge: 2018-10-19 | Disposition: A | Discharge status: Home / Self Care | Payer: PPO | Source: Ambulatory Visit | Attending: Family Medicine | Admitting: Family Medicine

## 2018-10-19 VITALS — BP 126/78 | HR 66 | Temp 98.2°F | Resp 12 | Ht 63.0 in | Wt 139.0 lb

## 2018-10-19 DIAGNOSIS — M899 Disorder of bone, unspecified: Secondary | ICD-10-CM | POA: Diagnosis not present

## 2018-10-19 DIAGNOSIS — M79605 Pain in left leg: Secondary | ICD-10-CM

## 2018-10-19 DIAGNOSIS — G4733 Obstructive sleep apnea (adult) (pediatric): Secondary | ICD-10-CM | POA: Diagnosis not present

## 2018-10-19 DIAGNOSIS — M21372 Foot drop, left foot: Secondary | ICD-10-CM | POA: Diagnosis not present

## 2018-10-19 DIAGNOSIS — R2242 Localized swelling, mass and lump, left lower limb: Secondary | ICD-10-CM | POA: Insufficient documentation

## 2018-10-19 DIAGNOSIS — M79672 Pain in left foot: Secondary | ICD-10-CM | POA: Insufficient documentation

## 2018-10-19 DIAGNOSIS — Z9989 Dependence on other enabling machines and devices: Secondary | ICD-10-CM | POA: Diagnosis not present

## 2018-10-19 MED ORDER — GADOBUTROL 1 MMOL/ML IV SOLN
6.0000 mL | Freq: Once | INTRAVENOUS | Status: AC | PRN
  Administered 2018-10-19: 6 mL via INTRAVENOUS

## 2018-10-19 NOTE — Patient Instructions (Signed)
We'll get the MRI and the physical therapy appointment ASAP Use the brace to prevent falls Be very careful and avoid falls We'll have the neurologist see you as well If you have not heard anything from my staff in a week about any orders/referrals/studies from today, please contact us here to follow-up [phone]

## 2018-10-19 NOTE — Telephone Encounter (Signed)
  Patient 's husband is calling- his wife is having increased pain in Left leg- the swelling is better- but patient has developed new neurological symptoms in the leg. Appointment has been scheduled- but husband thinks the MRI discussed at last appointment needs to be scheduled. A MyChart message was also sent with video. Reason for Disposition . [1] Thigh or calf pain AND [2] only 1 side AND [3] present > 1 hour  Answer Assessment - Initial Assessment Questions 1. ONSET: "When did the swelling start?" (e.g., minutes, hours, days)     Swelling is some better- last week 2. LOCATION: "What part of the leg is swollen?"  "Are both legs swollen or just one leg?"     L leg on calf 3. SEVERITY: "How bad is the swelling?" (e.g., localized; mild, moderate, severe)  - Localized - small area of swelling localized to one leg  - MILD pedal edema - swelling limited to foot and ankle, pitting edema < 1/4 inch (6 mm) deep, rest and elevation eliminate most or all swelling  - MODERATE edema - swelling of lower leg to knee, pitting edema > 1/4 inch (6 mm) deep, rest and elevation only partially reduce swelling  - SEVERE edema - swelling extends above knee, facial or hand swelling present      Localized 4. REDNESS: "Does the swelling look red or infected?"     No redness 5. PAIN: "Is the swelling painful to touch?" If so, ask: "How painful is it?"   (Scale 1-10; mild, moderate or severe)     Nerve pain- central leg to foot- left foot drop 6. FEVER: "Do you have a fever?" If so, ask: "What is it, how was it measured, and when did it start?"      No fever 7. CAUSE: "What do you think is causing the leg swelling?"     Nerve changes 8. MEDICAL HISTORY: "Do you have a history of heart failure, kidney disease, liver failure, or cancer?"     no 9. RECURRENT SYMPTOM: "Have you had leg swelling before?" If so, ask: "When was the last time?" "What happened that time?"     no 10. OTHER SYMPTOMS: "Do you have any other  symptoms?" (e.g., chest pain, difficulty breathing)       no 11. PREGNANCY: "Is there any chance you are pregnant?" "When was your last menstrual period?"       n/a  Protocols used: LEG SWELLING AND EDEMA-A-AH

## 2018-10-19 NOTE — Progress Notes (Signed)
BP 126/78   Pulse 66   Temp 98.2 F (36.8 C) (Oral)   Resp 12   Ht 5\' 3"  (1.6 m)   Wt 139 lb (63 kg)   SpO2 96%   BMI 24.62 kg/m    Subjective:    Patient ID: Kathy Baldwin, female    DOB: 04-Dec-1945, 73 y.o.   MRN: [id number]  HPI: [name] is a 73 y.o. female  Chief Complaint  Patient presents with  . Leg Pain    left with numbness, drop foot  . Cpap    face to face for supplies    HPI She is having nerve pain in the left foot Foot drop; started last week; Tuesday or Wednesday; just started last week; cannot dorsiflex Swelling has gone down on the side of the leg More nerve trouble than she was Trouble with sleeping She has sensation of tingling gets worse on the top of the right foot Painful sesnation on the top of the right foot; stops at the ankle No reason to avoid MRI; no pacemaker; no bladder stimulator No claustrophobia Has had the MRI in the past with no problems No night sweats or unexplained weight loss   She has obstructive sleep apnea Uses the CPAP every single night and when taking a nap Using at least 4 hours a night Mask fits appropriately; nasal pillows Last sleep study, Feb 2014; saw sleep study doctor in Feb 2014 AHI 29.5 pre-CPAP; SpO2 nadir 76% Titrated to 7 cmH2O; AHI 0 and Sp2 nadir 89% Moderate sleep apnea, no PLMS Start auto-CPAP 6-12 cmH2O, or fixed CPAP 7 cmH2O; currently using auto mode Heated humidity Avoid supine sleep was advised; does have reflux so will try elevating HOB GERD is managed by Dr. Servando Snare; he put her on fiber and ranitidine  Depression screen [facility] 2/9 10/08/2018 04/17/2018 03/09/2018 10/29/2017 03/20/2017  Decreased Interest 0 0 0 0 0  Down, Depressed, Hopeless 0 0 0 0 0  PHQ - 2 Score 0 0 0 0 0  Altered sleeping 0 0 - - -  Tired, decreased energy 0 0 - - -  Change in appetite 0 0 - - -  Feeling bad or failure about yourself  0 0 - - -  Trouble concentrating 0 0 - - -  Moving slowly or  fidgety/restless 0 0 - - -  Suicidal thoughts 0 0 - - -  PHQ-9 Score 0 0 - - -  Difficult doing work/chores Not difficult at all Not difficult at all - - -   Fall Risk  10/08/2018 04/17/2018 03/09/2018 10/29/2017 03/20/2017  Falls in the past year? 1 No Yes No No  Comment - removing dead leaves from a fig tree and fell to the ground - - -  Number falls in past yr: 0 1 1 - -  Injury with Fall? 0 No No - -  Risk for fall due to : - Impaired vision;Other (Comment);Impaired balance/gait;History of fall(s) - - -  Risk for fall due to: Comment - wears eyeglasses; vertigo - - -  Follow up - Falls evaluation completed;Education provided;Falls prevention discussed - - -    Relevant past medical, surgical, family and social history reviewed Past Medical History:  Diagnosis Date  . Allergic rhinitis   . 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 Holly Bodily 2010  . DNR no code (do not resuscitate) 03/20/2017  . GERD (gastroesophageal reflux disease)   .  Hearing loss   . History of gallstones   . Hyperlipidemia   . Hypertension   . Hypertrophy of nasal turbinates   . Lymphocytosis   . Menopause age 41  . Multiple food allergies    chocolate and cabbage  . OSA on CPAP   . Osteopenia Oct. 2014  . Sleep apnea   . Vertigo   . Vitamin D deficiency disease    Past Surgical History:  Procedure Laterality Date  . BREAST CYST ASPIRATION Right    negative  . CARPAL TUNNEL RELEASE Right 2015  . CATARACT EXTRACTION W/PHACO Left 09/30/2018   Procedure: CATARACT EXTRACTION PHACO AND INTRAOCULAR LENS PLACEMENT (IOC)  LEFT;  Surgeon: Lockie Mola, MD;  Location: [facility];  Service: Ophthalmology;  Laterality: Left;  sleep apnea  . CHOLECYSTECTOMY  15-20 years ago  . COLONOSCOPY  04/25/08  . DILATION AND CURETTAGE OF UTERUS  1982   s/p miscarriage   Family History  Problem Relation Age of Onset  . Healthy Mother   . Lung disease Father        black  lung  . Heart disease Father   . Hyperlipidemia Father   . Hypertension Father   . Emphysema Sister   . Healthy Sister   . Cancer Brother        unsure of type  . Asthma Daughter   . Heart disease Sister   . Thyroid disease Sister   . Stroke Maternal Grandmother   . Hypertension Maternal Grandmother   . Diabetes Maternal Grandmother   . Diabetes Sister   . Healthy Sister   . Breast cancer Neg Hx    Social History   Tobacco Use  . Smoking status: Never Smoker  . Smokeless tobacco: Never Used  . Tobacco comment: smoking cessation materials not required  Substance Use Topics  . Alcohol use: Yes    Alcohol/week: 0.0 standard drinks    Comment: occasional-holidays  . Drug use: No     Interim medical history since last visit reviewed. Allergies and medications reviewed  Review of Systems Per HPI unless specifically indicated above     Objective:    BP 126/78   Pulse 66   Temp 98.2 F (36.8 C) (Oral)   Resp 12   Ht 5\' 3"  (1.6 m)   Wt 139 lb (63 kg)   SpO2 96%   BMI 24.62 kg/m   Wt Readings from Last 3 Encounters:  10/19/18 139 lb (63 kg)  10/08/18 139 lb 3.2 oz (63.1 kg)  09/30/18 139 lb (63 kg)    Physical Exam Constitutional:      General: She is not in acute distress.    Appearance: She is well-developed. She is not diaphoretic.  HENT:     Head: Normocephalic and atraumatic.     Nose: No nasal deformity or mucosal edema.     Right Nostril: No foreign body or occlusion.     Left Nostril: No foreign body or occlusion.     Mouth/Throat:     Palate: No mass and lesions.     Pharynx: Oropharynx is clear. Uvula midline. No posterior oropharyngeal erythema.  Eyes:     General: No scleral icterus. Neck:     Thyroid: No thyromegaly.  Cardiovascular:     Rate and Rhythm: Normal rate and regular rhythm.     Heart sounds: Normal heart sounds. No murmur.  Pulmonary:     Effort: Pulmonary effort is normal. No respiratory distress.  Breath sounds: Normal  breath sounds. No wheezing.  Abdominal:     General: Bowel sounds are normal. There is no distension.     Palpations: Abdomen is soft.  Musculoskeletal:       Legs:  Skin:    General: Skin is warm and dry.     Coloration: Skin is not pale.     Comments: No contusion or skin changes over the left proximal lower leg; anterolateral aspect is firm to the touch  Neurological:     Mental Status: She is alert.     Deep Tendon Reflexes:     Reflex Scores:      Patellar reflexes are 2+ on the right side and 2+ on the left side.      Achilles reflexes are 2+ on the right side and 1+ on the left side.    Comments: Sensation altered, dysaesthesia, pain lateral LEFT leg and dorsum of the left foot; decreased sensation to monofilmanet testing great toe, 2nd toe, 3rd toe on the LEFT; dorsiflexion 2/5 on the LEFT; great toe extension 2/5 on the LEFT; 5/5 on the right  Psychiatric:        Behavior: Behavior normal.        Thought Content: Thought content normal.        Judgment: Judgment normal.    Results for orders placed or performed in visit on 10/08/18  TSH  Result Value Ref Range   TSH 0.94 0.40 - 4.50 mIU/L  CBC  Result Value Ref Range   WBC 5.8 3.8 - 10.8 Thousand/uL   RBC 4.41 3.80 - 5.10 Million/uL   Hemoglobin 13.5 11.7 - 15.5 g/dL   HCT 13.039.8 86.535.0 - 78.445.0 %   MCV 90.2 80.0 - 100.0 fL   MCH 30.6 27.0 - 33.0 pg   MCHC 33.9 32.0 - 36.0 g/dL   RDW 69.612.5 29.511.0 - 28.415.0 %   Platelets 216 140 - 400 Thousand/uL   MPV 10.0 7.5 - 12.5 fL  COMPLETE METABOLIC PANEL WITH GFR  Result Value Ref Range   Glucose, Bld 78 65 - 139 mg/dL   BUN 15 7 - 25 mg/dL   Creat 1.320.78 4.400.60 - 1.020.93 mg/dL   GFR, Est Non African American 75 > OR = 60 mL/min/1.2873m2   GFR, Est African American 87 > OR = 60 mL/min/1.5373m2   BUN/Creatinine Ratio NOT APPLICABLE 6 - 22 (calc)   Sodium 144 135 - 146 mmol/L   Potassium 4.3 3.5 - 5.3 mmol/L   Chloride 107 98 - 110 mmol/L   CO2 27 20 - 32 mmol/L   Calcium 10.4 8.6 - 10.4  mg/dL   Total Protein 7.1 6.1 - 8.1 g/dL   Albumin 4.2 3.6 - 5.1 g/dL   Globulin 2.9 1.9 - 3.7 g/dL (calc)   AG Ratio 1.4 1.0 - 2.5 (calc)   Total Bilirubin 1.2 0.2 - 1.2 mg/dL   Alkaline phosphatase (APISO) 58 33 - 130 U/L   AST 28 10 - 35 U/L   ALT 17 6 - 29 U/L  Lipid panel  Result Value Ref Range   Cholesterol 159 <200 mg/dL   HDL 81 >72>50 mg/dL   Triglycerides 50 <536<150 mg/dL   LDL Cholesterol (Calc) 66 mg/dL (calc)   Total CHOL/HDL Ratio 2.0 <5.0 (calc)   Non-HDL Cholesterol (Calc) 78 <644<130 mg/dL (calc)  VITAMIN D 25 Hydroxy (Vit-D Deficiency, Fractures)  Result Value Ref Range   Vit D, 25-Hydroxy 50 30 - 100 ng/mL  Vitamin B12  Result Value Ref Range   Vitamin B-12 >2,000 (H) 200 - 1,100 pg/mL      Assessment & Plan:   Problem List Items Addressed This Visit      Respiratory   OSA on CPAP    Using every single night, compliant; on auto mode 6-12 cmH2O; try to avoid supine sleep; she'll try elevating HOB for reflux; approval of machine and all equipment with face-to-face today       Other Visit Diagnoses    Pain of left lower extremity    -  Primary   Relevant Orders   MR TIBIA FIBULA LEFT W WO CONTRAST   Ambulatory referral to Physical Therapy   Foot drop, left       Relevant Orders   MR TIBIA FIBULA LEFT W WO CONTRAST   Ambulatory referral to Physical Therapy   Mass of left lower leg       Relevant Orders   MR TIBIA FIBULA LEFT W WO CONTRAST   Foot pain, left       Relevant Orders   MR TIBIA FIBULA LEFT W WO CONTRAST       Follow up plan: No follow-ups on file.  An after-visit summary was printed and given to the patient at check-out.  Please see the patient instructions which may contain other information and recommendations beyond what is mentioned above in the assessment and plan.  No orders of the defined types were placed in this encounter.   Orders Placed This Encounter  Procedures  . MR TIBIA FIBULA LEFT W WO CONTRAST  . Ambulatory referral to  Physical Therapy

## 2018-10-19 NOTE — Assessment & Plan Note (Signed)
Using every single night, compliant; on auto mode 6-12 cmH2O; try to avoid supine sleep; she'll try elevating HOB for reflux; approval of machine and all equipment with face-to-face today

## 2018-10-20 ENCOUNTER — Other Ambulatory Visit: Payer: Self-pay | Admitting: Family Medicine

## 2018-10-20 DIAGNOSIS — M21372 Foot drop, left foot: Secondary | ICD-10-CM

## 2018-10-20 DIAGNOSIS — R2242 Localized swelling, mass and lump, left lower limb: Secondary | ICD-10-CM

## 2018-10-20 DIAGNOSIS — M79605 Pain in left leg: Secondary | ICD-10-CM

## 2018-10-20 NOTE — Telephone Encounter (Signed)
Please make sure patient sees an IN-NETWORK provider for this ASAP Work with referral coordinator and respond to patient Thank you

## 2018-10-21 ENCOUNTER — Telehealth: Payer: Self-pay

## 2018-10-21 NOTE — Telephone Encounter (Signed)
Please coordinate this referral with Melissa to happen ASAP and respond to patient

## 2018-10-21 NOTE — Telephone Encounter (Signed)
Melissa in referrals has taken care of.  Copied from CRM [id number]. Topic: Referral - Request for Referral >> Oct 20, 2018  4:48 PM Lorrine Kin, [place] wrote: Has patient seen PCP for this complaint? Yes.   *If NO, is insurance requiring patient see PCP for this issue before PCP can refer them? Referral for which specialty: General Surgery Preferred provider/office: [facility] Doctor Reason for referral: Patient's husband calling and states that the surgery referral that was requested to [facility] they cannot use. [facility] is not in their insurance network. Please advise.

## 2018-10-22 NOTE — Telephone Encounter (Signed)
Kathy Baldwin, please check on this referral and respond to patient Thank you

## 2018-10-22 NOTE — Discharge Instructions (Signed)

## 2018-10-22 NOTE — Telephone Encounter (Signed)
Pt's spouse called in to follow up on Surgery referral. They would like a call back to confirm when this has been completed.

## 2018-10-23 ENCOUNTER — Telehealth: Payer: Self-pay

## 2018-10-23 ENCOUNTER — Encounter (INDEPENDENT_AMBULATORY_CARE_PROVIDER_SITE_OTHER): Payer: Self-pay | Admitting: Vascular Surgery

## 2018-10-23 ENCOUNTER — Telehealth: Payer: Self-pay | Admitting: Family Medicine

## 2018-10-23 ENCOUNTER — Other Ambulatory Visit (INDEPENDENT_AMBULATORY_CARE_PROVIDER_SITE_OTHER): Payer: PPO

## 2018-10-23 ENCOUNTER — Encounter: Payer: Self-pay | Admitting: Family Medicine

## 2018-10-23 ENCOUNTER — Ambulatory Visit (INDEPENDENT_AMBULATORY_CARE_PROVIDER_SITE_OTHER): Payer: PPO | Admitting: Vascular Surgery

## 2018-10-23 ENCOUNTER — Other Ambulatory Visit: Payer: Self-pay

## 2018-10-23 ENCOUNTER — Other Ambulatory Visit (INDEPENDENT_AMBULATORY_CARE_PROVIDER_SITE_OTHER): Payer: Self-pay | Admitting: Vascular Surgery

## 2018-10-23 VITALS — BP 153/82 | HR 61 | Resp 12 | Ht 63.0 in | Wt 138.0 lb

## 2018-10-23 DIAGNOSIS — E782 Mixed hyperlipidemia: Secondary | ICD-10-CM

## 2018-10-23 DIAGNOSIS — I6523 Occlusion and stenosis of bilateral carotid arteries: Secondary | ICD-10-CM

## 2018-10-23 DIAGNOSIS — I10 Essential (primary) hypertension: Secondary | ICD-10-CM | POA: Diagnosis not present

## 2018-10-23 DIAGNOSIS — Z8673 Personal history of transient ischemic attack (TIA), and cerebral infarction without residual deficits: Secondary | ICD-10-CM

## 2018-10-23 DIAGNOSIS — R2242 Localized swelling, mass and lump, left lower limb: Secondary | ICD-10-CM

## 2018-10-23 DIAGNOSIS — M6740 Ganglion, unspecified site: Secondary | ICD-10-CM | POA: Insufficient documentation

## 2018-10-23 DIAGNOSIS — I639 Cerebral infarction, unspecified: Secondary | ICD-10-CM | POA: Diagnosis not present

## 2018-10-23 NOTE — Telephone Encounter (Signed)
Called and spoke with patients husband. Notified  Referral was placed through [facility]. Patients husband stated he was not sure if [facility] was in network either.   Patients husband stated that they went to [facility] this morning and that Dr. Wyn Quaker said he did not see any reason he could not perform the surgery.  Please advise on next steps.

## 2018-10-23 NOTE — Assessment & Plan Note (Signed)
blood pressure control important in reducing the progression of atherosclerotic disease. On appropriate oral medications.  

## 2018-10-23 NOTE — Telephone Encounter (Signed)
Spoke with husband and informed him that referral was sent to Dr. Edilia Bo and I also gave their office phone number (all information is in referral order).

## 2018-10-23 NOTE — Telephone Encounter (Signed)
Dr. Wyn Quaker is a vascular surgeon Miel, can you please assist with this? Patient has a soft tissue mass in the leg compressing a nerve I need her to get in to see a surgeon to remove this quickly

## 2018-10-23 NOTE — Patient Instructions (Signed)
Carotid Artery Disease  The carotid arteries are arteries on both sides of the neck. They carry blood to the brain, face, and neck. Carotid artery disease happens when these arteries become smaller (narrow) or get blocked. If these arteries become smaller or get blocked, you are more likely to have a stroke or a warning stroke (transient ischemic attack). Follow these instructions at home:  Take over-the-counter and prescription medicines only as told by your doctor.  Make sure you understand all instructions about your medicines. Do not stop taking your medicines without talking to your doctor first.  Follow your doctor's diet instructions. It is important to follow a healthy diet. ? Eat foods that include plenty of: ? Fresh fruits. ? Vegetables. ? Lean meats. ? Avoid these foods: ? Foods that are high in fat. ? Foods that are high in salt (sodium). ? Foods that are fried. ? Foods that are processed. ? Foods that have few good nutrients (poor nutritional value).  Keep a healthy weight.  Stay active. Get at least 30 minutes of activity every day.  Do not smoke.  Limit alcohol use to: ? No more than 2 drinks a day for men. ? No more than 1 drink a day for women who are not pregnant.  Do not use illegal drugs.  Keep all follow-up visits as told by your doctor. This is important. Contact a doctor if: Get help right away if:  You have any symptoms of stroke or TIA. The acronym BEFAST is an easy way to remember the main warning signs of stroke. ? B = Balance problems. Signs include dizziness, sudden trouble walking, or loss of balance ? E = Eye problems. This includes trouble seeing or a sudden change in vision. ? F = Face changes. This includes sudden weakness or numbness of the face, or the face or eyelid drooping to one side. ? A = Arm weakness or numbness. This happens suddenly and usually on one side of the body. ? S = Speech problems. This includes trouble speaking or  trouble understanding. ? T = Time. Time to call 911 or seek emergency care. Do not wait to see if symptoms go away. Make note of the time your symptoms started.  Other signs of stroke may include: ? A sudden, severe headache with no known cause. ? Feeling sick to your stomach (nauseous) or throwing up (vomiting). ? Seizure. Call your local emergency services (911 in [place] Do notdrive yourself to the clinic or hospital. Summary  The carotid arteries are arteries on both sides of the neck.  If these arteries get smaller or get blocked, you are more likely to have a stroke or a warning stroke (transient ischemic attack).  Take over-the-counter and prescription medicines only as told by your doctor.  Keep all follow-up visits as told by your doctor. This is important. This information is not intended to replace advice given to you by your health care provider. Make sure you discuss any questions you have with your health care provider. Document Released: 08/12/2012 Document Revised: 08/21/2017 Document Reviewed: 08/21/2017 Elsevier Interactive Patient Education  2019 [employer] Inc[employer]  

## 2018-10-23 NOTE — Telephone Encounter (Signed)
Copied from CRM [id number]. Topic: General - Other >> Oct 23, 2018 10:08 AM Tamela Oddi wrote: Reason for CRM: Patient called to speak with the referral coordinator regarding a referral that was sent in.  Please advise and call patient back at [phone]

## 2018-10-23 NOTE — Assessment & Plan Note (Signed)
lipid control important in reducing the progression of atherosclerotic disease. Continue statin therapy  

## 2018-10-23 NOTE — Assessment & Plan Note (Signed)
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  She is being referred for surgical resection of that.  I do not know exactly what the mass is, but I have reviewed the MRI with the family today.  This appears to be away from the major vascular structures and resection would seem to be a reasonably safe option when she sees her surgeon.

## 2018-10-23 NOTE — Assessment & Plan Note (Signed)
The patient has moderate plaque in the right carotid bulb with velocities still representing a mild carotid artery stenosis.  The left carotid artery has scant plaque.  No role for intervention at this level.  Would recommend antiplatelet therapy and a statin agent.  Plan to recheck in 1 year.

## 2018-10-23 NOTE — Telephone Encounter (Signed)
Kathy Baldwin, please do whatever is needed to make sure this patient has an appointment with a surgeon for her leg mass and is notified of the appointment by the end of today (Friday Feb 14th) Thank you

## 2018-10-23 NOTE — Telephone Encounter (Signed)
Patient called to update the doctor on her referral information.  She stated that the appointment was made for October 27, 2018 at 12:30.  If there are any questions, please call patient.

## 2018-10-23 NOTE — Progress Notes (Signed)
Patient ID: [name], female   DOB: [birth year], 73 y.o.   MRN: [id number]  Chief Complaint  Patient presents with  . New Patient (Initial Visit)    HPI Kathy Baldwin is a 73 y.o. female.  I am asked to see the patient by Dr. Sherie Don for evaluation of carotid atherosclerosis and previous strokes on MRI.  The patient reports some memory issues and some cognitive issues.  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  Speaking of the mass, she had an MRI for this which I reviewed with the family at their request. This appears to be remote from the vascular structures.  They are being referred to a surgeon for resection of this. As for the carotid disease, she had an ultrasound last summer which showed significant plaque in the right carotid bulb without high-grade stenosis.  There was less plaque in the left carotid bulb.  A subsequent study showed cerebellar infarcts.  She is on aspirin and Crestor.  For further evaluation of her carotid disease, we repeated a carotid duplex today. The patient has moderate plaque in the right carotid bulb with velocities still representing a mild carotid artery stenosis.  The left carotid artery has scant plaque.   Past Medical History:  Diagnosis Date  . Allergic rhinitis   . 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 Holly Bodily 2010  . DNR no code (do not resuscitate) 03/20/2017  . GERD (gastroesophageal reflux disease)   . Hearing loss   . History of gallstones   . Hyperlipidemia   . Hypertension   . Hypertrophy of nasal turbinates   . Lymphocytosis   . Menopause age 27  . Multiple food allergies    chocolate and cabbage  . OSA on CPAP   . Osteopenia Oct. 2014  . Sleep apnea   . Vertigo   . Vitamin D deficiency disease     Past Surgical History:  Procedure Laterality Date  . BREAST CYST ASPIRATION  Right    negative  . CARPAL TUNNEL RELEASE Right 2015  . CATARACT EXTRACTION W/PHACO Left 09/30/2018   Procedure: CATARACT EXTRACTION PHACO AND INTRAOCULAR LENS PLACEMENT (IOC)  LEFT;  Surgeon: Lockie Mola, MD;  Location: [facility];  Service: Ophthalmology;  Laterality: Left;  sleep apnea  . CHOLECYSTECTOMY  15-20 years ago  . COLONOSCOPY  04/25/08  . DILATION AND CURETTAGE OF UTERUS  1982   s/p miscarriage    Family History  Problem Relation Age of Onset  . Healthy Mother   . Lung disease Father        black lung  . Heart disease Father   . Hyperlipidemia Father   . Hypertension Father   . Emphysema Sister   . Healthy Sister   . Cancer Brother        unsure of type  . Asthma Daughter   . Heart disease Sister   . Thyroid disease Sister   . Stroke Maternal Grandmother   . Hypertension Maternal Grandmother   . Diabetes Maternal Grandmother   . Diabetes Sister   . Healthy Sister   . Breast cancer Neg Hx     Social History Social History   Tobacco Use  . Smoking status: Never Smoker  . Smokeless tobacco: Never Used  . Tobacco comment: smoking cessation  materials not required  Substance Use Topics  . Alcohol use: Yes    Alcohol/week: 0.0 standard drinks    Comment: occasional-holidays  . Drug use: No    Allergies  Allergen Reactions  . Cabbage Other (See Comments)    Hypersensitivity on allergy testing  . Chocolate Other (See Comments)    Hypersensitivity on allergy testing  . Statins Other (See Comments)    Zocor and Pravastatin--leg cramps    Current Outpatient Medications  Medication Sig Dispense Refill  . aspirin EC 325 MG tablet Take 1 tablet (325 mg total) by mouth daily.    . calcium carbonate (OSCAL) 1500 (600 CA) MG TABS tablet Take 600 mg of elemental calcium by mouth 2 (two) times daily with a meal.    . COLLAGEN PO Take by mouth daily.    Marland Kitchen conjugated estrogens (PREMARIN) vaginal cream Use a pea-sized amount externally and  internally every night for 2 weeks, then just twice a week 42.5 g 12  . Cyanocobalamin (B-12) 2500 MCG TABS Take 1 tablet by mouth daily.    Marland Kitchen FIBER PO Take by mouth.    . fluticasone (FLONASE) 50 MCG/ACT nasal spray SHAKE LIQUID AND USE 2 SPRAYS IN EACH NOSTRIL DAILY 16 g 11  . meclizine (ANTIVERT) 25 MG tablet Take 25 mg by mouth 3 (three) times daily as needed for dizziness.    . Multiple Vitamin (MULTIVITAMIN) tablet Take 1 tablet by mouth daily.    Marland Kitchen PEDIASURE/FIBER (PEDIASURE/FIBER) LIQD Take 10 mLs by mouth.    . polyethylene glycol (MIRALAX / GLYCOLAX) packet Take 17 g by mouth daily.    . ranitidine (ZANTAC) 300 MG tablet TAKE 1 TABLET(300 MG) BY MOUTH AT BEDTIME FOR HEARTBURN OR REFLUX 30 tablet 5  . rosuvastatin (CRESTOR) 10 MG tablet Take 1 tablet (10 mg total) by mouth at bedtime. 90 tablet 1  . scopolamine (TRANSDERM-SCOP) 1 MG/3DAYS Place 1 mg onto the skin every 3 (three) days.     No current facility-administered medications for this visit.       REVIEW OF SYSTEMS (Negative unless checked)  Constitutional: [] Weight loss  [] Fever  [] Chills Cardiac: [] Chest pain   [] Chest pressure   [] Palpitations   [] Shortness of breath when laying flat   [] Shortness of breath at rest   [] Shortness of breath with exertion. Vascular:  [] Pain in legs with walking   [] Pain in legs at rest   [] Pain in legs when laying flat   [] Claudication   [] Pain in feet when walking  [] Pain in feet at rest  [] Pain in feet when laying flat   [] History of DVT   [] Phlebitis   [] Swelling in legs   [] Varicose veins   [] Non-healing ulcers Pulmonary:   [] Uses home oxygen   [] Productive cough   [] Hemoptysis   [] Wheeze  [] COPD   [] Asthma Neurologic:  [] Dizziness  [] Blackouts   [] Seizures   [x] History of stroke   [] History of TIA  [] Aphasia   [] Temporary blindness   [] Dysphagia   [] Weakness or numbness in arms   [x] Weakness or numbness in legs Musculoskeletal:  [x] Arthritis   [] Joint swelling   [] Joint pain   [] Low back  pain Hematologic:  [] Easy bruising  [] Easy bleeding   [] Hypercoagulable state   [] Anemic  [] Hepatitis Gastrointestinal:  [] Blood in stool   [] Vomiting blood  [x] Gastroesophageal reflux/heartburn   [] Abdominal pain Genitourinary:  [] Chronic kidney disease   [] Difficult urination  [] Frequent urination  [] Burning with urination   [] Hematuria Skin:  [] Rashes   []   Ulcers   [] Wounds Psychological:  [] History of anxiety   []  History of major depression.    Physical Exam BP (!) 153/82 (BP Location: Left Arm, Patient Position: Sitting, Cuff Size: Small)   Pulse 61   Resp 12   Ht 5\' 3"  (1.6 m)   Wt 138 lb (62.6 kg)   BMI 24.45 kg/m  Gen:  WD/WN, NAD Head: [place]/AT, No temporalis wasting.  Ear/Nose/Throat: Hearing grossly intact, nares w/o erythema or drainage, oropharynx w/o Erythema/Exudate Eyes: Conjunctiva clear, sclera non-icteric  Neck: trachea midline.  No bruit or JVD.  Pulmonary:  Good air movement, clear to auscultation bilaterally.  Cardiac: RRR, no JVD Vascular:  Vessel Right Left  Radial Palpable Palpable                          PT Palpable Palpable  DP Palpable Palpable    Musculoskeletal: M/S 5/5 throughout.  Extremities without ischemic changes.  Palpable masses present in the left lateral lower leg . 1+ LLE edema. Neurologic: Sensation grossly intact in extremities.  Symmetrical.  Speech is fluent. Motor exam as listed above. Psychiatric: Judgment intact, Mood & affect appropriate for pt's clinical situation. Dermatologic: No rashes or ulcers noted.  No cellulitis or open wounds.    Radiology Mr Tibia Fibula Left W Wo Contrast  Result Date: 10/19/2018 CLINICAL DATA:  Lump on the posterior aspect of the left lower leg for 1 week. EXAM: MRI OF LOWER LEFT EXTREMITY WITHOUT AND WITH CONTRAST TECHNIQUE: Multiplanar, multisequence MR imaging of the the left lower leg was performed both before and after administration of intravenous contrast. CONTRAST:  6 mL Gadavist IV.  COMPARISON:  None. FINDINGS: Bones/Joint/Cartilage Bone marrow signal is normal without fracture, stress change or focal lesion. Ligaments Intact. Muscles and Tendons There is a lesion in the peroneus longus measuring up to 2.3 cm transverse by 2.6 cm AP by 6.2 cm craniocaudal. The lesion is hypointense to muscle on T1 weighted imaging, T2 hyperintense with septations and demonstrates enhancement of its rim and septations. The lesion extends from the inferior aspect of the proximal tib-fib joint and is centered 5 cm below the top of the fibular head. The lesion likely impinges on the common peroneal nerve. There is mild edema in the peroneal musculature compatible with early denervation changes. Imaged muscles otherwise appear normal. No tear is identified. Soft tissues Negative. IMPRESSION: Lesion in the peroneus longus as described above could be a ganglion, abscess, intramuscular myxoma or myxoid liposarcoma. The lesion impinges on the common peroneal nerve. Mild edema in the peroneal musculature may be due to early denervation changes. Electronically Signed   By: Drusilla Kanner M.D.   On: 10/19/2018 20:03   US Venous Img Lower Unilateral Left  Result Date: 10/08/2018 CLINICAL DATA:  Left lower extremity edema, pain EXAM: LEFT LOWER EXTREMITY VENOUS DOPPLER ULTRASOUND TECHNIQUE: Gray-scale sonography with graded compression, as well as color Doppler and duplex ultrasound were performed to evaluate the lower extremity deep venous systems from the level of the common femoral vein and including the common femoral, femoral, profunda femoral, popliteal and calf veins including the posterior tibial, peroneal and gastrocnemius veins when visible. The superficial great saphenous vein was also interrogated. Spectral Doppler was utilized to evaluate flow at rest and with distal augmentation maneuvers in the common femoral, femoral and popliteal veins. COMPARISON:  None. FINDINGS: Contralateral Common Femoral Vein:  Respiratory phasicity is normal and symmetric with the symptomatic side. No evidence of  thrombus. Normal compressibility. Common Femoral Vein: No evidence of thrombus. Normal compressibility, respiratory phasicity and response to augmentation. Saphenofemoral Junction: No evidence of thrombus. Normal compressibility and flow on color Doppler imaging. Profunda Femoral Vein: No evidence of thrombus. Normal compressibility and flow on color Doppler imaging. Femoral Vein: No evidence of thrombus. Normal compressibility, respiratory phasicity and response to augmentation. Popliteal Vein: No evidence of thrombus. Normal compressibility, respiratory phasicity and response to augmentation. Calf Veins: No evidence of thrombus. Normal compressibility and flow on color Doppler imaging. Superficial Great Saphenous Vein: No evidence of thrombus. Normal compressibility. Venous Reflux:  None. Other Findings: Left lateral calf area proximally demonstrates a minimally complex septated soft tissue cystic abnormality measuring 8.6 x 2.5 x 4.1 cm. No associated vascularity. This remains nonspecific by ultrasound IMPRESSION: Negative for left lower extremity DVT. Left lateral proximal calf minimally complex septated cystic lesion without vascularity remains nonspecific by ultrasound. This may represent sequelae from prior trauma or hematoma. Electronically Signed   By: Judie PetitM.  Shick M.D.   On: 10/08/2018 13:37    Labs Recent Results (from the past 2160 hour(s))  TSH     Status: None   Collection Time: 10/08/18 11:56 AM  Result Value Ref Range   TSH 0.94 0.40 - 4.50 mIU/L  CBC     Status: None   Collection Time: 10/08/18 11:56 AM  Result Value Ref Range   WBC 5.8 3.8 - 10.8 Thousand/uL   RBC 4.41 3.80 - 5.10 Million/uL   Hemoglobin 13.5 11.7 - 15.5 g/dL   HCT 16.139.8 09.635.0 - 04.545.0 %   MCV 90.2 80.0 - 100.0 fL   MCH 30.6 27.0 - 33.0 pg   MCHC 33.9 32.0 - 36.0 g/dL   RDW 40.912.5 81.111.0 - 91.415.0 %   Platelets 216 140 - 400 Thousand/uL    MPV 10.0 7.5 - 12.5 fL  COMPLETE METABOLIC PANEL WITH GFR     Status: None   Collection Time: 10/08/18 11:56 AM  Result Value Ref Range   Glucose, Bld 78 65 - 139 mg/dL    Comment: .        Non-fasting reference interval .    BUN 15 7 - 25 mg/dL   Creat 7.820.78 9.560.60 - 2.130.93 mg/dL    Comment: For patients >10849 years of age, the reference limit for Creatinine is approximately 13% higher for people identified as African-American. .    GFR, Est Non African American 75 > OR = 60 mL/min/1.8173m2   GFR, Est African American 87 > OR = 60 mL/min/1.5873m2   BUN/Creatinine Ratio NOT APPLICABLE 6 - 22 (calc)   Sodium 144 135 - 146 mmol/L   Potassium 4.3 3.5 - 5.3 mmol/L   Chloride 107 98 - 110 mmol/L   CO2 27 20 - 32 mmol/L   Calcium 10.4 8.6 - 10.4 mg/dL   Total Protein 7.1 6.1 - 8.1 g/dL   Albumin 4.2 3.6 - 5.1 g/dL   Globulin 2.9 1.9 - 3.7 g/dL (calc)   AG Ratio 1.4 1.0 - 2.5 (calc)   Total Bilirubin 1.2 0.2 - 1.2 mg/dL   Alkaline phosphatase (APISO) 58 33 - 130 U/L   AST 28 10 - 35 U/L   ALT 17 6 - 29 U/L  Lipid panel     Status: None   Collection Time: 10/08/18 11:56 AM  Result Value Ref Range   Cholesterol 159 <200 mg/dL   HDL 81 >08>50 mg/dL   Triglycerides 50 <657<150 mg/dL   LDL Cholesterol (Calc)  66 mg/dL (calc)    Comment: Reference range: <100 . Desirable range <100 mg/dL for primary prevention;   <70 mg/dL for patients with CHD or diabetic patients  with > or = 2 CHD risk factors. Marland Kitchen LDL-C is now calculated using the Martin-Hopkins  calculation, which is a validated novel method providing  better accuracy than the Friedewald equation in the  estimation of LDL-C.  Horald Pollen et al. Lenox Ahr. 5277;824(23): 2061-2068  (http://education.[facility].com/faq/FAQ164)    Total CHOL/HDL Ratio 2.0 <5.0 (calc)   Non-HDL Cholesterol (Calc) 78 <536 mg/dL (calc)    Comment: For patients with diabetes plus 1 major ASCVD risk  factor, treating to a non-HDL-C goal of <100 mg/dL  (LDL-C of <14  mg/dL) is considered a therapeutic  option.   VITAMIN D 25 Hydroxy (Vit-D Deficiency, Fractures)     Status: None   Collection Time: 10/08/18 11:56 AM  Result Value Ref Range   Vit D, 25-Hydroxy 50 30 - 100 ng/mL    Comment: Vitamin D Status         25-OH Vitamin D: . Deficiency:                    <20 ng/mL Insufficiency:             20 - 29 ng/mL Optimal:                 > or = 30 ng/mL . For 25-OH Vitamin D testing on patients on  D2-supplementation and patients for whom quantitation  of D2 and D3 fractions is required, the QuestAssureD(TM) 25-OH VIT D, (D2,D3), LC/MS/MS is recommended: order  code 43154 (patients >48yrs). . For more information on this test, go to: http://education.[facility].com/faq/FAQ163 (This link is being provided for  informational/educational purposes only.)   Vitamin B12     Status: Abnormal   Collection Time: 10/08/18 11:56 AM  Result Value Ref Range   Vitamin B-12 >2,000 (H) 200 - 1,100 pg/mL    Assessment/Plan:  Hyperlipidemia lipid control important in reducing the progression of atherosclerotic disease. Continue statin therapy   Hypertension blood pressure control important in reducing the progression of atherosclerotic disease. On appropriate oral medications.   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  Would agree with antiplatelet and statin therapy going forward.  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  She is being referred for surgical resection of that.  I do not know exactly what the mass is, but I have reviewed the MRI with the family today.  This appears to be away from the major vascular structures and resection would seem to be a reasonably safe option when she sees her surgeon.  Carotid atherosclerosis, bilateral The patient has moderate plaque in the right carotid bulb with velocities still representing a  mild carotid artery stenosis.  The left carotid artery has scant plaque.  No role for intervention at this level.  Would recommend antiplatelet therapy and a statin agent.  Plan to recheck in 1 year.      Festus Barren 10/23/2018, 10:35 AM   This note was created with Dragon medical transcription system.  Any errors from dictation are unintentional.

## 2018-10-23 NOTE — Telephone Encounter (Signed)
Patient husband is calling and states that [facility] is in network and he would like to know the surgeon name.

## 2018-10-23 NOTE — Assessment & Plan Note (Signed)
Possibly microembolic in nature.  Would agree with antiplatelet and statin therapy going forward.

## 2018-10-27 DIAGNOSIS — R2242 Localized swelling, mass and lump, left lower limb: Secondary | ICD-10-CM | POA: Diagnosis not present

## 2018-10-28 ENCOUNTER — Ambulatory Visit
Admission: RE | Admit: 2018-10-28 | Discharge: 2018-10-28 | Disposition: A | Discharge status: Home / Self Care | Payer: PPO | Attending: Ophthalmology | Admitting: Ophthalmology

## 2018-10-28 ENCOUNTER — Encounter
Admission: RE | Disposition: A | Discharge status: Home / Self Care | Payer: Self-pay | Source: Home / Self Care | Attending: Ophthalmology

## 2018-10-28 ENCOUNTER — Ambulatory Visit: Payer: PPO | Admitting: Anesthesiology

## 2018-10-28 DIAGNOSIS — H25811 Combined forms of age-related cataract, right eye: Secondary | ICD-10-CM | POA: Diagnosis not present

## 2018-10-28 DIAGNOSIS — G4730 Sleep apnea, unspecified: Secondary | ICD-10-CM | POA: Insufficient documentation

## 2018-10-28 DIAGNOSIS — H2511 Age-related nuclear cataract, right eye: Secondary | ICD-10-CM | POA: Diagnosis not present

## 2018-10-28 DIAGNOSIS — I10 Essential (primary) hypertension: Secondary | ICD-10-CM | POA: Diagnosis not present

## 2018-10-28 HISTORY — PX: CATARACT EXTRACTION W/PHACO: SHX586

## 2018-10-28 HISTORY — DX: Localized swelling, mass and lump, left lower limb: R22.42

## 2018-10-28 SURGERY — PHACOEMULSIFICATION, CATARACT, WITH IOL INSERTION
Anesthesia: Monitor Anesthesia Care | Site: Eye | Laterality: Right

## 2018-10-28 MED ORDER — ARMC OPHTHALMIC DILATING DROPS
1.0000 "application " | OPHTHALMIC | Status: DC | PRN
  Administered 2018-10-28 (×3): 1 via OPHTHALMIC

## 2018-10-28 MED ORDER — ACETAMINOPHEN 325 MG PO TABS: 325.0000 mg | ORAL_TABLET | ORAL | Status: DC | PRN

## 2018-10-28 MED ORDER — BRIMONIDINE TARTRATE-TIMOLOL 0.2-0.5 % OP SOLN
OPHTHALMIC | Status: DC | PRN
  Administered 2018-10-28: 1 [drp] via OPHTHALMIC

## 2018-10-28 MED ORDER — MIDAZOLAM HCL 2 MG/2ML IJ SOLN
INTRAMUSCULAR | Status: DC | PRN
  Administered 2018-10-28: 2 mg via INTRAVENOUS

## 2018-10-28 MED ORDER — EPINEPHRINE PF 1 MG/ML IJ SOLN
INTRAOCULAR | Status: DC | PRN
  Administered 2018-10-28: 64 mL via OPHTHALMIC

## 2018-10-28 MED ORDER — CEFUROXIME OPHTHALMIC INJECTION 1 MG/0.1 ML
INJECTION | OPHTHALMIC | Status: DC | PRN
  Administered 2018-10-28: 0.1 mL via INTRACAMERAL

## 2018-10-28 MED ORDER — FENTANYL CITRATE (PF) 100 MCG/2ML IJ SOLN
INTRAMUSCULAR | Status: DC | PRN
  Administered 2018-10-28: 50 ug via INTRAVENOUS

## 2018-10-28 MED ORDER — LIDOCAINE HCL (PF) 2 % IJ SOLN
INTRAOCULAR | Status: DC | PRN
  Administered 2018-10-28: 1 mL

## 2018-10-28 MED ORDER — TETRACAINE HCL 0.5 % OP SOLN
1.0000 [drp] | OPHTHALMIC | Status: DC | PRN
  Administered 2018-10-28 (×3): 1 [drp] via OPHTHALMIC

## 2018-10-28 MED ORDER — LACTATED RINGERS IV SOLN: INTRAVENOUS | Status: DC

## 2018-10-28 MED ORDER — NA HYALUR & NA CHOND-NA HYALUR 0.4-0.35 ML IO KIT
PACK | INTRAOCULAR | Status: DC | PRN
  Administered 2018-10-28: 1 mL via INTRAOCULAR

## 2018-10-28 MED ORDER — ACETAMINOPHEN 160 MG/5ML PO SOLN: 325.0000 mg | ORAL | Status: DC | PRN

## 2018-10-28 MED ORDER — MOXIFLOXACIN HCL 0.5 % OP SOLN
1.0000 [drp] | OPHTHALMIC | Status: DC | PRN
  Administered 2018-10-28 (×3): 1 [drp] via OPHTHALMIC

## 2018-10-28 SURGICAL SUPPLY — 21 items
CANNULA ANT/CHMB 27G (MISCELLANEOUS) ×1 IMPLANT
CANNULA ANT/CHMB 27GA (MISCELLANEOUS) ×2 IMPLANT
GLOVE SURG LX 7.5 STRW (GLOVE) ×1
GLOVE SURG LX STRL 7.5 STRW (GLOVE) ×1 IMPLANT
GLOVE SURG TRIUMPH 8.0 PF LTX (GLOVE) ×2 IMPLANT
GOWN STRL REUS W/ TWL LRG LVL3 (GOWN DISPOSABLE) ×2 IMPLANT
GOWN STRL REUS W/TWL LRG LVL3 (GOWN DISPOSABLE) ×2
LENS IOL ACRSF IQ ULTRA 16.5 (Intraocular Lens) IMPLANT
LENS IOL ACRYSOF IQ 16.5 (Intraocular Lens) ×2 IMPLANT
MARKER SKIN DUAL TIP RULER LAB (MISCELLANEOUS) ×2 IMPLANT
NDL FILTER BLUNT 18X1 1/2 (NEEDLE) ×1 IMPLANT
NEEDLE FILTER BLUNT 18X 1/2SAF (NEEDLE) ×1
NEEDLE FILTER BLUNT 18X1 1/2 (NEEDLE) ×1 IMPLANT
PACK CATARACT BRASINGTON (MISCELLANEOUS) ×2 IMPLANT
PACK EYE AFTER SURG (MISCELLANEOUS) ×2 IMPLANT
PACK OPTHALMIC (MISCELLANEOUS) ×2 IMPLANT
SYR 3ML LL SCALE MARK (SYRINGE) ×2 IMPLANT
SYR 5ML LL (SYRINGE) ×2 IMPLANT
SYR TB 1ML LUER SLIP (SYRINGE) ×2 IMPLANT
WATER STERILE IRR 500ML POUR (IV SOLUTION) ×2 IMPLANT
WIPE NON LINTING 3.25X3.25 (MISCELLANEOUS) ×2 IMPLANT

## 2018-10-28 NOTE — Anesthesia Procedure Notes (Signed)
Procedure Name: MAC Performed by: Ramondo Dietze, CRNA Pre-anesthesia Checklist: Patient identified, Emergency Drugs available, Suction available, Timeout performed and Patient being monitored Patient Re-evaluated:Patient Re-evaluated prior to induction Oxygen Delivery Method: Nasal cannula Placement Confirmation: positive ETCO2       

## 2018-10-28 NOTE — H&P (Signed)
The History and Physical notes are on paper, have been signed, and are to be scanned. The patient remains stable and unchanged from the H&P.   Previous H&P reviewed, patient examined, and there are no changes.  The patient has a visually significant cataract interfering with his or her vision.  I attest that the following are true and accurate to the best of my knowledge: 1. The patient's impairment of visual function is believed not to be correctable with a tolerable change in glasses or contact lenses. 2. Cataract (in the operative eye) is believed to be significantly contributing to the patient's visual impairment. 3. The patient desires surgical correction; the risks, benefits, and alternatives have been explained, and questions have been answered to the patients satisfaction.  A reasonable expectation exists that cataract surgery will significantly improve both the visual and functional status of the patient.  Kathy Baldwin 10/28/2018 9:28 AM   

## 2018-10-28 NOTE — Transfer of Care (Signed)
Immediate Anesthesia Transfer of Care Note  Patient: Kathy Baldwin  Procedure(s) Performed: CATARACT EXTRACTION PHACO AND INTRAOCULAR LENS PLACEMENT (IOC)  RIGHT (Right Eye)  Patient Location: PACU  Anesthesia Type: MAC  Level of Consciousness: awake, alert  and patient cooperative  Airway and Oxygen Therapy: Patient Spontanous Breathing and Patient connected to supplemental oxygen  Post-op Assessment: Post-op Vital signs reviewed, Patient's Cardiovascular Status Stable, Respiratory Function Stable, Patent Airway and No signs of Nausea or vomiting  Post-op Vital Signs: Reviewed and stable  Complications: No apparent anesthesia complications

## 2018-10-28 NOTE — Anesthesia Preprocedure Evaluation (Signed)
Anesthesia Evaluation  Patient identified by MRN, date of birth, ID band Patient awake    Reviewed: Allergy & Precautions, H&P , NPO status , Patient's Chart, lab work & pertinent test results, reviewed documented beta blocker date and time   Airway Mallampati: II  TM Distance: >3 FB Neck ROM: full    Dental no notable dental hx.    Pulmonary sleep apnea and Continuous Positive Airway Pressure Ventilation ,    Pulmonary exam normal breath sounds clear to auscultation       Cardiovascular Exercise Tolerance: Good hypertension,  Rhythm:regular Rate:Normal     Neuro/Psych negative neurological ROS  negative psych ROS   GI/Hepatic Neg liver ROS, GERD  ,  Endo/Other  negative endocrine ROS  Renal/GU negative Renal ROS  negative genitourinary   Musculoskeletal   Abdominal   Peds  Hematology negative hematology ROS (+)   Anesthesia Other Findings   Reproductive/Obstetrics negative OB ROS                             Anesthesia Physical Anesthesia Plan  ASA: II  Anesthesia Plan: MAC   Post-op Pain Management:    Induction:   PONV Risk Score and Plan:   Airway Management Planned:   Additional Equipment:   Intra-op Plan:   Post-operative Plan:   Informed Consent: I have reviewed the patients History and Physical, chart, labs and discussed the procedure including the risks, benefits and alternatives for the proposed anesthesia with the patient or authorized representative who has indicated his/her understanding and acceptance.     Dental Advisory Given  Plan Discussed with: CRNA and Anesthesiologist  Anesthesia Plan Comments:         Anesthesia Quick Evaluation

## 2018-10-28 NOTE — Anesthesia Postprocedure Evaluation (Signed)
Anesthesia Post Note  Patient: Kathy Baldwin  Procedure(s) Performed: CATARACT EXTRACTION PHACO AND INTRAOCULAR LENS PLACEMENT ([place])  RIGHT (Right Eye)  Patient location during evaluation: PACU Anesthesia Type: MAC Level of consciousness: awake and alert Pain management: pain level controlled Vital Signs Assessment: post-procedure vital signs reviewed and stable Respiratory status: spontaneous breathing, nonlabored ventilation, respiratory function stable and patient connected to nasal cannula oxygen Cardiovascular status: stable and blood pressure returned to baseline Postop Assessment: no apparent nausea or vomiting Anesthetic complications: no    Trecia Rogers

## 2018-10-28 NOTE — Op Note (Signed)
LOCATION:  [facility]   PREOPERATIVE DIAGNOSIS:    Nuclear sclerotic cataract right eye. H25.11   POSTOPERATIVE DIAGNOSIS:  Nuclear sclerotic cataract right eye.     PROCEDURE:  Phacoemusification with posterior chamber intraocular lens placement of the right eye   LENS:   Implant Name Type Inv. Item Serial No. Manufacturer Lot No. LRB No. Used  LENS IOL ACRYSOF IQ 16.5 - [id number] Intraocular Lens LENS IOL ACRYSOF IQ 16.5 [id number] ALCON  Right 1        ULTRASOUND TIME: 11 % of 0 minutes, 56 seconds.  CDE 6.0   SURGEON:  Deirdre Evener, MD   ANESTHESIA:  Topical with tetracaine drops and 2% Xylocaine jelly, augmented with 1% preservative-free intracameral lidocaine.    COMPLICATIONS:  None.   DESCRIPTION OF PROCEDURE:  The patient was identified in the holding room and transported to the operating room and placed in the supine position under the operating microscope.  The right eye was identified as the operative eye and it was prepped and draped in the usual sterile ophthalmic fashion.   A 1 millimeter clear-corneal paracentesis was made at the 12:00 position.  0.5 ml of preservative-free 1% lidocaine was injected into the anterior chamber. The anterior chamber was filled with Viscoat viscoelastic.  A 2.4 millimeter keratome was used to make a near-clear corneal incision at the 9:00 position.  A curvilinear capsulorrhexis was made with a cystotome and capsulorrhexis forceps.  Balanced salt solution was used to hydrodissect and hydrodelineate the nucleus.   Phacoemulsification was then used in stop and chop fashion to remove the lens nucleus and epinucleus.  The remaining cortex was then removed using the irrigation and aspiration handpiece. Provisc was then placed into the capsular bag to distend it for lens placement.  A lens was then injected into the capsular bag.  The remaining viscoelastic was aspirated.   Wounds were hydrated with balanced salt solution.   The anterior chamber was inflated to a physiologic pressure with balanced salt solution.  No wound leaks were noted. Cefuroxime 0.1 ml of a 10mg /ml solution was injected into the anterior chamber for a dose of 1 mg of intracameral antibiotic at the completion of the case.   Timolol and Brimonidine drops were applied to the eye.  The patient was taken to the recovery room in stable condition without complications of anesthesia or surgery.   Kathy Baldwin 10/28/2018, 10:15 AM

## 2018-11-04 DIAGNOSIS — R2242 Localized swelling, mass and lump, left lower limb: Secondary | ICD-10-CM | POA: Diagnosis not present

## 2018-11-04 DIAGNOSIS — M67962 Unspecified disorder of synovium and tendon, left lower leg: Secondary | ICD-10-CM | POA: Diagnosis not present

## 2018-11-04 DIAGNOSIS — G4733 Obstructive sleep apnea (adult) (pediatric): Secondary | ICD-10-CM | POA: Diagnosis not present

## 2018-11-04 DIAGNOSIS — M85662 Other cyst of bone, left lower leg: Secondary | ICD-10-CM | POA: Diagnosis not present

## 2018-11-11 ENCOUNTER — Telehealth: Payer: Self-pay

## 2018-11-11 DIAGNOSIS — Z9989 Dependence on other enabling machines and devices: Principal | ICD-10-CM

## 2018-11-11 DIAGNOSIS — G4733 Obstructive sleep apnea (adult) (pediatric): Secondary | ICD-10-CM

## 2018-11-11 NOTE — Telephone Encounter (Signed)
Copied from CRM [id number]. Topic: General - Other >> Nov 11, 2018  2:12 PM Percival Spanish wrote:  Pt husband call to give information about pt CPAP supplies  [employer] will need office notes and RX    Fax number [phone]

## 2018-11-12 NOTE — Telephone Encounter (Signed)
RX FAXED AND NOTES

## 2018-11-13 DIAGNOSIS — G4733 Obstructive sleep apnea (adult) (pediatric): Secondary | ICD-10-CM | POA: Diagnosis not present

## 2018-11-13 NOTE — Telephone Encounter (Signed)
Pt called in to follow up on Rx for CPAP supplies being faxed. Made pt aware that Rx has been faxed

## 2018-11-16 DIAGNOSIS — R413 Other amnesia: Secondary | ICD-10-CM | POA: Diagnosis not present

## 2018-11-19 DIAGNOSIS — G3184 Mild cognitive impairment, so stated: Secondary | ICD-10-CM | POA: Insufficient documentation

## 2018-11-19 DIAGNOSIS — F03A0 Unspecified dementia, mild, without behavioral disturbance, psychotic disturbance, mood disturbance, and anxiety: Secondary | ICD-10-CM | POA: Insufficient documentation

## 2018-11-19 DIAGNOSIS — R413 Other amnesia: Secondary | ICD-10-CM | POA: Insufficient documentation

## 2018-11-27 ENCOUNTER — Encounter: Payer: Self-pay | Admitting: Family Medicine

## 2018-12-03 DIAGNOSIS — E042 Nontoxic multinodular goiter: Secondary | ICD-10-CM | POA: Diagnosis not present

## 2018-12-03 DIAGNOSIS — G4733 Obstructive sleep apnea (adult) (pediatric): Secondary | ICD-10-CM | POA: Diagnosis not present

## 2018-12-11 DIAGNOSIS — M8589 Other specified disorders of bone density and structure, multiple sites: Secondary | ICD-10-CM | POA: Diagnosis not present

## 2018-12-11 DIAGNOSIS — E042 Nontoxic multinodular goiter: Secondary | ICD-10-CM | POA: Diagnosis not present

## 2018-12-11 DIAGNOSIS — Z780 Asymptomatic menopausal state: Secondary | ICD-10-CM | POA: Diagnosis not present

## 2018-12-22 ENCOUNTER — Other Ambulatory Visit: Payer: Self-pay | Admitting: Family Medicine

## 2018-12-22 NOTE — Telephone Encounter (Signed)
Lab Results  Component Value Date   ALT 17 10/08/2018   Lab Results  Component Value Date   CHOL 159 10/08/2018   HDL 81 10/08/2018   LDLCALC 66 10/08/2018   TRIG 50 10/08/2018   CHOLHDL 2.0 10/08/2018

## 2018-12-23 ENCOUNTER — Encounter: Payer: Self-pay | Admitting: Family Medicine

## 2018-12-23 MED ORDER — FAMOTIDINE 20 MG PO TABS: 20.0000 mg | ORAL_TABLET | Freq: Two times a day (BID) | ORAL | 2 refills | Status: DC | PRN

## 2019-01-03 DIAGNOSIS — G4733 Obstructive sleep apnea (adult) (pediatric): Secondary | ICD-10-CM | POA: Diagnosis not present

## 2019-01-12 ENCOUNTER — Encounter: Payer: Self-pay | Admitting: Family Medicine

## 2019-01-12 MED ORDER — ESTROGENS, CONJUGATED 0.625 MG/GM VA CREA: TOPICAL_CREAM | VAGINAL | 1 refills | Status: DC

## 2019-02-16 DIAGNOSIS — R413 Other amnesia: Secondary | ICD-10-CM | POA: Diagnosis not present

## 2019-02-24 DIAGNOSIS — M21372 Foot drop, left foot: Secondary | ICD-10-CM | POA: Diagnosis not present

## 2019-02-24 DIAGNOSIS — R2242 Localized swelling, mass and lump, left lower limb: Secondary | ICD-10-CM | POA: Diagnosis not present

## 2019-03-10 DIAGNOSIS — IMO0002 Reserved for concepts with insufficient information to code with codable children: Secondary | ICD-10-CM | POA: Insufficient documentation

## 2019-03-15 ENCOUNTER — Encounter: Payer: Self-pay | Admitting: Family Medicine

## 2019-03-15 ENCOUNTER — Other Ambulatory Visit: Payer: Self-pay | Admitting: Family Medicine

## 2019-03-15 DIAGNOSIS — Z1239 Encounter for other screening for malignant neoplasm of breast: Secondary | ICD-10-CM

## 2019-03-18 DIAGNOSIS — Z780 Asymptomatic menopausal state: Secondary | ICD-10-CM | POA: Diagnosis not present

## 2019-03-18 DIAGNOSIS — G4733 Obstructive sleep apnea (adult) (pediatric): Secondary | ICD-10-CM | POA: Diagnosis not present

## 2019-03-18 DIAGNOSIS — M8589 Other specified disorders of bone density and structure, multiple sites: Secondary | ICD-10-CM | POA: Diagnosis not present

## 2019-03-22 ENCOUNTER — Other Ambulatory Visit: Payer: Self-pay | Admitting: Family Medicine

## 2019-03-24 DIAGNOSIS — G4733 Obstructive sleep apnea (adult) (pediatric): Secondary | ICD-10-CM | POA: Diagnosis not present

## 2019-03-31 ENCOUNTER — Telehealth: Payer: Self-pay | Admitting: Family Medicine

## 2019-03-31 NOTE — Telephone Encounter (Signed)
Copied from [place] [id number]. Topic: Quick Communication - See Telephone Encounter >> Mar 31, 2019  3:16 PM Nils Flack wrote: CRM for notification. See Telephone encounter for: 03/31/19. Pt is having surgery on cyst on leg on 04/05/19. She wants to know if she should continue taking the 325 Asprin or if she needs to stop  Please call [phone] Ot you can post to [facility]

## 2019-04-03 DIAGNOSIS — Z1159 Encounter for screening for other viral diseases: Secondary | ICD-10-CM | POA: Diagnosis not present

## 2019-04-05 DIAGNOSIS — M79662 Pain in left lower leg: Secondary | ICD-10-CM | POA: Diagnosis not present

## 2019-04-05 DIAGNOSIS — I10 Essential (primary) hypertension: Secondary | ICD-10-CM | POA: Diagnosis not present

## 2019-04-05 DIAGNOSIS — R2242 Localized swelling, mass and lump, left lower limb: Secondary | ICD-10-CM | POA: Diagnosis not present

## 2019-04-05 DIAGNOSIS — R011 Cardiac murmur, unspecified: Secondary | ICD-10-CM | POA: Diagnosis not present

## 2019-04-05 DIAGNOSIS — M7989 Other specified soft tissue disorders: Secondary | ICD-10-CM | POA: Diagnosis not present

## 2019-04-05 DIAGNOSIS — M21372 Foot drop, left foot: Secondary | ICD-10-CM | POA: Diagnosis not present

## 2019-04-05 DIAGNOSIS — M67472 Ganglion, left ankle and foot: Secondary | ICD-10-CM | POA: Diagnosis not present

## 2019-04-05 DIAGNOSIS — M6748 Ganglion, other site: Secondary | ICD-10-CM | POA: Diagnosis not present

## 2019-04-05 DIAGNOSIS — R229 Localized swelling, mass and lump, unspecified: Secondary | ICD-10-CM | POA: Diagnosis not present

## 2019-04-13 ENCOUNTER — Other Ambulatory Visit: Payer: Self-pay | Admitting: Family Medicine

## 2019-04-20 ENCOUNTER — Ambulatory Visit (INDEPENDENT_AMBULATORY_CARE_PROVIDER_SITE_OTHER): Payer: PPO

## 2019-04-20 VITALS — BP 159/90 | Ht 63.0 in | Wt 135.0 lb

## 2019-04-20 DIAGNOSIS — Z0000 Encounter for general adult medical examination without abnormal findings: Secondary | ICD-10-CM | POA: Diagnosis not present

## 2019-04-20 NOTE — Progress Notes (Signed)
Subjective:   Kathy Baldwin is a 73 y.o. female who presents for Medicare Annual (Subsequent) preventive examination.  Virtual Visit via Telephone Note  I connected with Kathy Baldwin on 04/20/19 at  2:10 PM EDT by telephone and verified that I am speaking with the correct person using two identifiers.  Medicare Annual Wellness visit completed telephonically due to Covid-19 pandemic.   Location: Patient: home Provider: office   I discussed the limitations, risks, security and privacy concerns of performing an evaluation and management service by telephone and the availability of in person appointments. The patient expressed understanding and agreed to proceed.  Some vital signs may be absent or patient reported.   Kathy LittlerKasey Zala Degrasse, Baldwin    Review of Systems:   Cardiac Risk Factors include: advanced age (>5755men, 41>65 women);dyslipidemia     Objective:     Vitals: BP (!) 159/90    Ht 5\' 3"  (1.6 m)    Wt 135 lb (61.2 kg)    BMI 23.91 kg/m   Body mass index is 23.91 kg/m.  Advanced Directives 04/20/2019 10/28/2018 09/30/2018 04/17/2018 05/06/2017 03/20/2017 02/18/2017  Does Patient Have a Medical Advance Directive? Yes Yes Yes Yes Yes Yes Yes  Type of Estate agentAdvance Directive Healthcare Power of [place]orney;Living will Healthcare Power of [place]orney;Living will Healthcare Power of [place];Living will Healthcare Power of [place];Living will Healthcare Power of [place];Living will - -  Does patient want to make changes to medical advance directive? - No - Patient declined - - - - -  Copy of Healthcare Power of Attorney in Chart? No - copy requested No - copy requested No - copy requested No - copy requested - - -    Tobacco Social History   Tobacco Use  Smoking Status Never Smoker  Smokeless Tobacco Never Used  Tobacco Comment   Never smoked, so cannot quit...     Counseling given: Not Answered Comment: Never smoked, so cannot quit...   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 Holly Baldwin 2010   DNR no code (do not resuscitate) 03/20/2017   GERD (gastroesophageal reflux disease)    Hearing loss    History of gallstones    Hyperlipidemia    Hypertension    Hypertrophy of nasal turbinates    Leg mass, left    in calf muscle seeing surgeon tomorrow regarding   Lymphocytosis    Menopause age 73   Multiple food allergies    chocolate and cabbage   OSA on CPAP    Osteopenia Oct. 2014   Sleep apnea    Thyroid nodule    Vertigo    Vitamin D deficiency disease    Past Surgical History:  Procedure Laterality Date   BREAST CYST ASPIRATION Right    negative  CARPAL TUNNEL RELEASE Right 2015   CATARACT EXTRACTION W/PHACO Left 09/30/2018   Procedure: CATARACT EXTRACTION PHACO AND INTRAOCULAR LENS PLACEMENT (IOC)  LEFT;  Surgeon: Kathy Baldwin, Chadwick, MD;  [facility];  Service: Ophthalmology;  Laterality: Left;  sleep apnea   CATARACT EXTRACTION W/PHACO Right 10/28/2018   Procedure: CATARACT EXTRACTION PHACO AND INTRAOCULAR LENS PLACEMENT (IOC)  RIGHT;  Surgeon: Kathy Baldwin, Chadwick, MD;  [facility];  Service: Ophthalmology;  Laterality: Right;   CHOLECYSTECTOMY  15-20 years ago   COLONOSCOPY  04/25/08   DILATION AND CURETTAGE OF UTERUS  1982     s/p miscarriage   EYE SURGERY  Cataract - both eyes   2019 ([facility]-Dr. Inez PilgrimBrasington)   Family History  Problem Relation Age of Onset   Healthy Mother    Arthritis Mother    Lung disease Father        black lung   Heart disease Father    Hyperlipidemia Father    Hypertension Father    Emphysema Sister    Healthy Sister    Cancer Brother        unsure of type   Asthma Daughter    Heart disease Sister    Thyroid disease Sister    Stroke Maternal Grandmother    Hypertension Maternal Grandmother    Diabetes Maternal Grandmother    Diabetes Sister    Healthy Sister    Breast cancer Neg Hx    Social History   Socioeconomic History   Marital status: Married    Spouse name: Kathy Baldwin   Number of children: 1   Years of education: Not on file   Highest education level: Bachelor's degree (e.g., BA, AB, BS)  Occupational History   Occupation: Retired  Ecologistocial Needs   Financial resource strain: Not hard at [employer]   Food insecurity    Worry: Never true    Inability: Never true   Transportation needs    Medical: No    Non-medical: No  Tobacco Use   Smoking status: Never Smoker   Smokeless tobacco: Never Used   Tobacco comment: Never smoked, so cannot quit...  Substance and Sexual Activity   Alcohol use: Not Currently    Alcohol/week: 0.0 standard drinks    Comment: Occasional celebratory drink (holidays, etc.) only.   Drug use: No   Sexual activity: Yes    Partners: Male    Birth control/protection: Post-menopausal  Lifestyle   Physical activity    Days per week: 0 days    Minutes per session: 0 min   Stress: Not at all  Relationships   Social connections    Talks on phone: Patient refused    Gets together: Patient refused    Attends religious service: Patient refused    Active member of club or organization: Patient refused    Attends meetings of clubs or organizations: Patient refused    Relationship status: Married  Other  Topics Concern   Not on file  Social History Narrative   Not on file    Outpatient Encounter Medications as of 04/20/2019  Medication Sig   aspirin EC 325 MG tablet Take 1 tablet (325 mg total) by mouth daily.   calcium carbonate (OSCAL) 1500 (600 CA) MG TABS tablet Take 600 mg of elemental calcium by mouth 2 (two) times daily with a meal.   conjugated estrogens (PREMARIN) vaginal cream Use a pea-sized amount externally and internally twice a week at night   Cyanocobalamin (B-12) 2500 MCG TABS Take  1 tablet by mouth daily. Pt taking tabs   donepezil (ARICEPT) 10 MG tablet Take 10 mg by mouth at bedtime.    famotidine (PEPCID) 20 MG tablet TAKE 1 TABLET BY MOUTH TWICE DAILY AS NEEDED FOR HEARTBURN AND INDIGESTION   FIBER PO Take by mouth.   fluticasone (FLONASE) 50 MCG/ACT nasal spray SHAKE LIQUID AND USE 2 SPRAYS IN EACH NOSTRIL DAILY   Multiple Vitamin (MULTIVITAMIN) tablet Take 1 tablet by mouth daily.   PEDIASURE/FIBER (PEDIASURE/FIBER) LIQD Take 10 mLs by mouth.   polyethylene glycol (MIRALAX / GLYCOLAX) packet Take 17 g by mouth daily.   rosuvastatin (CRESTOR) 10 MG tablet TAKE 1 TABLET(10 MG) BY MOUTH AT BEDTIME   [DISCONTINUED] ondansetron (ZOFRAN-ODT) 4 MG disintegrating tablet Take by mouth.   [DISCONTINUED] oxyCODONE (OXY IR/ROXICODONE) 5 MG immediate release tablet Take by mouth.   [DISCONTINUED] COLLAGEN PO Take by mouth daily.   [DISCONTINUED] meclizine (ANTIVERT) 25 MG tablet Take 25 mg by mouth 3 (three) times daily as needed for dizziness.   [DISCONTINUED] scopolamine (TRANSDERM-SCOP) 1 MG/3DAYS Place 1 mg onto the skin every 3 (three) days.   No facility-administered encounter medications on file as of 04/20/2019.     Activities of Daily Living In your present state of health, do you have any difficulty performing the following activities: 04/20/2019 10/28/2018  Hearing? Y N  Comment wears hearing aids -  Vision? N N  Difficulty concentrating  or making decisions? Y N  Comment sees Dr. Malvin Johns -  Walking or climbing stairs? N N  Dressing or bathing? N N  Doing errands, shopping? N -  Preparing Food and eating ? N -  Using the Toilet? N -  In the past six months, have you accidently leaked urine? N -  Do you have problems with loss of bowel control? N -  Managing your Medications? N -  Managing your Finances? N -  Housekeeping or managing your Housekeeping? N -  Some recent data might be hidden    Patient Care Team: Lada, Janit Bern, MD as PCP - General (Family Medicine) Midge Minium, MD as Consulting Physician (Gastroenterology) [facility], [facility] (Optometry) Solum, Marlana Salvage, MD as Physician Assistant (Endocrinology) Linus Salmons, MD as Consulting Physician (Otolaryngology) Kathy Mola, MD as Referring Physician (Ophthalmology)    Assessment:   This is a routine wellness examination for Jeymi.  Exercise Activities and Dietary recommendations Current Exercise Habits: The patient does not participate in regular exercise at present, Exercise limited by: None identified  Goals     DIET - INCREASE WATER INTAKE     Recommend to drink at least 6-8 8oz glasses of water per day.       Fall Risk Fall Risk  04/20/2019 10/08/2018 04/17/2018 03/09/2018 10/29/2017  Falls in the past year? 0 1 No Yes No  Comment - - removing dead leaves from a fig tree and fell to the ground - -  Number falls in past yr: 0 0 1 1 -  Injury with Fall? 0 0 No No -  Risk for fall due to : - - Impaired vision;Other (Comment);Impaired balance/gait;History of fall(s) - -  Risk for fall due to: Comment - - wears eyeglasses; vertigo - -  Follow up Falls prevention discussed - Falls evaluation completed;Education provided;Falls prevention discussed - -   FALL RISK PREVENTION PERTAINING TO THE HOME:  Any stairs in or around the home? Yes  If so, do they handrails? Yes   Home free of loose  throw rugs in walkways, pet beds, electrical cords,  etc? Yes  Adequate lighting in your home to reduce risk of falls? Yes   ASSISTIVE DEVICES UTILIZED TO PREVENT FALLS:  Life alert? No  Use of a cane, walker or w/c? No  Grab bars in the bathroom? No  Shower chair or bench in shower? No  Elevated toilet seat or a handicapped toilet? No   DME ORDERS:  DME order needed?  No   TIMED UP AND GO:  Was the test performed? No . Telephonic visit.  Education: Fall risk prevention has been discussed.  Intervention(s) required? No    Depression Screen PHQ 2/9 Scores 04/20/2019 10/08/2018 04/17/2018 03/09/2018  PHQ - 2 Score 0 0 0 0  PHQ- 9 Score - 0 0 -     Cognitive Function - memory tests performed by Dr. Kerrie [place] Screen 04/17/2018 03/20/2017  What Year? 0 points 0 points  What month? 0 points 0 points  What time? 0 points 0 points  Count back from 20 0 points 0 points  Months in reverse 0 points 0 points  Repeat phrase 0 points 0 points  Total Score 0 0    Immunization History  Administered Date(s) Administered   Influenza, High Dose Seasonal PF 06/27/2017, 05/21/2018   Influenza, Seasonal, Injecte, Preservative Fre 05/25/2015   Influenza-Unspecified 06/12/2011, 05/10/2014, 06/22/2016, 06/27/2017   Pneumococcal Conjugate-13 11/30/2014   Pneumococcal Polysaccharide-23 08/13/2011, 10/01/2013   Td 06/09/2002   Tdap 09/10/2011, 08/13/2012   Zoster 08/13/2012   Zoster Recombinat (Shingrix) 04/27/2018, 06/29/2018    Qualifies for Shingles Vaccine? Yes  Shingrix series complete.   Tdap: Up to date  Flu Vaccine: Up to date  Pneumococcal Vaccine: Up to date   Screening Tests Health Maintenance  Topic Date Due   MAMMOGRAM  04/03/2019   INFLUENZA VACCINE  04/10/2019   Fecal DNA (Cologuard)  03/24/2021   TETANUS/TDAP  08/13/2022   DEXA SCAN  Completed   Hepatitis C Screening  Completed   PNA vac Low Risk Adult  Completed    Cancer Screenings:  Colorectal Screening: Cologuard completed 03/24/18.  Repeat every 3 years;   Mammogram: Completed 04/02/18. Repeat every year. Scheduled for 04/21/19.  Bone Density: Completed 02/20/18. Results reflect OSTEOPENIA. Repeat every 2 years. Managed by endocrinology. Dr. Gabriel Carina.  Lung Cancer Screening: (Low Dose CT Chest recommended if Age 45-80 years, 30 pack-year currently smoking OR have quit w/in 15years.) does not qualify.     Additional Screening:  Hepatitis C Screening: does qualify; Completed 08/29/15  Vision Screening: Recommended annual ophthalmology exams for early detection of glaucoma and other disorders of the eye. Is the patient up to date with their annual eye exam?  Yes  Who is the provider or what is the name of the office in which the pt attends annual eye exams? [place] [facility]: Recommended annual dental exams for proper oral hygiene  Community Resource Referral:  CRR required this visit?  No      Plan:     I have personally reviewed and addressed the Medicare Annual Wellness questionnaire and have noted the following in the patients chart:  A. Medical and social history B. Use of alcohol, tobacco or illicit drugs  C. Current medications and supplements D. Functional ability and status E.  Nutritional status F.  Physical activity G. Advance directives H. List of other physicians I.  Hospitalizations, surgeries, and ER visits in previous 12 months J.  Vitals K.  Screenings such as hearing and vision if needed, cognitive and depression L. Referrals and appointments   In addition, I have reviewed and discussed with patient certain preventive protocols, quality metrics, and best practice recommendations. A written personalized care plan for preventive services as well as general preventive health recommendations were provided to patient.   Signed,  Kathy LittlerKasey Lash Matulich, Baldwin Nurse Health Advisor   Nurse Notes: none

## 2019-04-20 NOTE — Patient Instructions (Signed)
Kathy Baldwin , Thank you for taking time to come for your Medicare Wellness Visit. I appreciate your ongoing commitment to your health goals. Please review the following plan we discussed and let me know if I can assist you in the future.   Screening recommendations/referrals: Colonoscopy: cologuard completed 03/24/18. Repeat in 2022 Mammogram: done 04/02/18. Scheduled for 04/21/19 Bone Density: done 02/20/18 Recommended yearly ophthalmology/optometry visit for glaucoma screening and checkup Recommended yearly dental visit for hygiene and checkup  Vaccinations: Influenza vaccine: done 05/21/18 Pneumococcal vaccine: done 11/30/14 Tdap vaccine: done 08/13/12 Shingles vaccine: shingrix series completed 06/29/18    Advanced directives: Please bring a copy of your health care power of attorney and living will to the office at your convenience.  Conditions/risks identified: Keep up the great work!  Next appointment: Please follow up in one year for your Medicare Annual Wellness visit.     Preventive Care 70 Years and Older, Female Preventive care refers to lifestyle choices and visits with your health care provider that can promote health and wellness. What does preventive care include?  A yearly physical exam. This is also called an annual well check.  Dental exams once or twice a year.  Routine eye exams. Ask your health care provider how often you should have your eyes checked.  Personal lifestyle choices, including:  Daily care of your teeth and gums.  Regular physical activity.  Eating a healthy diet.  Avoiding tobacco and drug use.  Limiting alcohol use.  Practicing safe sex.  Taking low-dose aspirin every day.  Taking vitamin and mineral supplements as recommended by your health care provider. What happens during an annual well check? The services and screenings done by your health care provider during your annual well check will depend on your age, overall health,  lifestyle risk factors, and family history of disease. Counseling  Your health care provider may ask you questions about your:  Alcohol use.  Tobacco use.  Drug use.  Emotional well-being.  Home and relationship well-being.  Sexual activity.  Eating habits.  History of falls.  Memory and ability to understand (cognition).  Work and work Statistician.  Reproductive health. Screening  You may have the following tests or measurements:  Height, weight, and BMI.  Blood pressure.  Lipid and cholesterol levels. These may be checked every 5 years, or more frequently if you are over 70 years old.  Skin check.  Lung cancer screening. You may have this screening every year starting at age 54 if you have a 30-pack-year history of smoking and currently smoke or have quit within the past 15 years.  Fecal occult blood test (FOBT) of the stool. You may have this test every year starting at age 17.  Flexible sigmoidoscopy or colonoscopy. You may have a sigmoidoscopy every 5 years or a colonoscopy every 10 years starting at age 14.  Hepatitis C blood test.  Hepatitis B blood test.  Sexually transmitted disease (STD) testing.  Diabetes screening. This is done by checking your blood sugar (glucose) after you have not eaten for a while (fasting). You may have this done every 1-3 years.  Bone density scan. This is done to screen for osteoporosis. You may have this done starting at age 36.  Mammogram. This may be done every 1-2 years. Talk to your health care provider about how often you should have regular mammograms. Talk with your health care provider about your test results, treatment options, and if necessary, the need for more tests. Vaccines  Your  health care provider may recommend certain vaccines, such as:  Influenza vaccine. This is recommended every year.  Tetanus, diphtheria, and acellular pertussis (Tdap, Td) vaccine. You may need a Td booster every 10 years.  Zoster  vaccine. You may need this after age 62.  Pneumococcal 13-valent conjugate (PCV13) vaccine. One dose is recommended after age 56.  Pneumococcal polysaccharide (PPSV23) vaccine. One dose is recommended after age 68. Talk to your health care provider about which screenings and vaccines you need and how often you need them. This information is not intended to replace advice given to you by your health care provider. Make sure you discuss any questions you have with your health care provider. Document Released: 09/22/2015 Document Revised: 05/15/2016 Document Reviewed: 06/27/2015 Elsevier Interactive Patient Education  2017 [place] Prevention in the Home Falls can cause injuries. They can happen to people of all ages. There are many things you can do to make your home safe and to help prevent falls. What can I do on the outside of my home?  Regularly fix the edges of walkways and driveways and fix any cracks.  Remove anything that might make you trip as you walk through a door, such as a raised step or threshold.  Trim any bushes or trees on the path to your home.  Use bright outdoor lighting.  Clear any walking paths of anything that might make someone trip, such as rocks or tools.  Regularly check to see if handrails are loose or broken. Make sure that both sides of any steps have handrails.  Any raised decks and porches should have guardrails on the edges.  Have any leaves, snow, or ice cleared regularly.  Use sand or salt on walking paths during winter.  Clean up any spills in your garage right away. This includes oil or grease spills. What can I do in the bathroom?  Use night lights.  Install grab bars by the toilet and in the tub and shower. Do not use towel bars as grab bars.  Use non-skid mats or decals in the tub or shower.  If you need to sit down in the shower, use a plastic, non-slip stool.  Keep the floor dry. Clean up any water that spills on the  floor as soon as it happens.  Remove soap buildup in the tub or shower regularly.  Attach bath mats securely with double-sided non-slip rug tape.  Do not have throw rugs and other things on the floor that can make you trip. What can I do in the bedroom?  Use night lights.  Make sure that you have a light by your bed that is easy to reach.  Do not use any sheets or blankets that are too big for your bed. They should not hang down onto the floor.  Have a firm chair that has side arms. You can use this for support while you get dressed.  Do not have throw rugs and other things on the floor that can make you trip. What can I do in the kitchen?  Clean up any spills right away.  Avoid walking on wet floors.  Keep items that you use a lot in easy-to-reach places.  If you need to reach something above you, use a strong step stool that has a grab bar.  Keep electrical cords out of the way.  Do not use floor polish or wax that makes floors slippery. If you must use wax, use non-skid floor wax.  Do not  have throw rugs and other things on the floor that can make you trip. What can I do with my stairs?  Do not leave any items on the stairs.  Make sure that there are handrails on both sides of the stairs and use them. Fix handrails that are broken or loose. Make sure that handrails are as long as the stairways.  Check any carpeting to make sure that it is firmly attached to the stairs. Fix any carpet that is loose or worn.  Avoid having throw rugs at the top or bottom of the stairs. If you do have throw rugs, attach them to the floor with carpet tape.  Make sure that you have a light switch at the top of the stairs and the bottom of the stairs. If you do not have them, ask someone to add them for you. What else can I do to help prevent falls?  Wear shoes that:  Do not have high heels.  Have rubber bottoms.  Are comfortable and fit you well.  Are closed at the toe. Do not wear  sandals.  If you use a stepladder:  Make sure that it is fully opened. Do not climb a closed stepladder.  Make sure that both sides of the stepladder are locked into place.  Ask someone to hold it for you, if possible.  Clearly mark and make sure that you can see:  Any grab bars or handrails.  First and last steps.  Where the edge of each step is.  Use tools that help you move around (mobility aids) if they are needed. These include:  Canes.  Walkers.  Scooters.  Crutches.  Turn on the lights when you go into a dark area. Replace any light bulbs as soon as they burn out.  Set up your furniture so you have a clear path. Avoid moving your furniture around.  If any of your floors are uneven, fix them.  If there are any pets around you, be aware of where they are.  Review your medicines with your doctor. Some medicines can make you feel dizzy. This can increase your chance of falling. Ask your doctor what other things that you can do to help prevent falls. This information is not intended to replace advice given to you by your health care provider. Make sure you discuss any questions you have with your health care provider. Document Released: 06/22/2009 Document Revised: 02/01/2016 Document Reviewed: 09/30/2014 Elsevier Interactive Patient Education  2017 [employer].

## 2019-04-21 ENCOUNTER — Ambulatory Visit
Admission: RE | Admit: 2019-04-21 | Discharge: 2019-04-21 | Disposition: A | Discharge status: Home / Self Care | Payer: PPO | Source: Ambulatory Visit | Attending: Family Medicine | Admitting: Family Medicine

## 2019-04-21 ENCOUNTER — Other Ambulatory Visit: Payer: Self-pay

## 2019-04-21 DIAGNOSIS — Z1231 Encounter for screening mammogram for malignant neoplasm of breast: Secondary | ICD-10-CM | POA: Diagnosis not present

## 2019-04-21 DIAGNOSIS — Z1239 Encounter for other screening for malignant neoplasm of breast: Secondary | ICD-10-CM

## 2019-05-07 DIAGNOSIS — M810 Age-related osteoporosis without current pathological fracture: Secondary | ICD-10-CM | POA: Diagnosis not present

## 2019-06-16 ENCOUNTER — Other Ambulatory Visit: Payer: Self-pay

## 2019-06-16 MED ORDER — ROSUVASTATIN CALCIUM 10 MG PO TABS: ORAL_TABLET | ORAL | 1 refills | Status: DC

## 2019-06-22 DIAGNOSIS — E042 Nontoxic multinodular goiter: Secondary | ICD-10-CM | POA: Diagnosis not present

## 2019-06-22 DIAGNOSIS — M8589 Other specified disorders of bone density and structure, multiple sites: Secondary | ICD-10-CM | POA: Diagnosis not present

## 2019-06-22 DIAGNOSIS — Z780 Asymptomatic menopausal state: Secondary | ICD-10-CM | POA: Diagnosis not present

## 2019-06-24 DIAGNOSIS — G4733 Obstructive sleep apnea (adult) (pediatric): Secondary | ICD-10-CM | POA: Diagnosis not present

## 2019-07-05 DIAGNOSIS — Z961 Presence of intraocular lens: Secondary | ICD-10-CM | POA: Diagnosis not present

## 2019-07-06 DIAGNOSIS — E042 Nontoxic multinodular goiter: Secondary | ICD-10-CM | POA: Diagnosis not present

## 2019-07-29 ENCOUNTER — Encounter: Payer: Self-pay | Admitting: Family Medicine

## 2019-07-29 DIAGNOSIS — M9903 Segmental and somatic dysfunction of lumbar region: Secondary | ICD-10-CM | POA: Diagnosis not present

## 2019-07-29 DIAGNOSIS — M5442 Lumbago with sciatica, left side: Secondary | ICD-10-CM | POA: Diagnosis not present

## 2019-07-29 DIAGNOSIS — M9904 Segmental and somatic dysfunction of sacral region: Secondary | ICD-10-CM | POA: Diagnosis not present

## 2019-07-29 DIAGNOSIS — M461 Sacroiliitis, not elsewhere classified: Secondary | ICD-10-CM | POA: Diagnosis not present

## 2019-07-29 DIAGNOSIS — M545 Low back pain: Secondary | ICD-10-CM | POA: Diagnosis not present

## 2019-08-02 DIAGNOSIS — M545 Low back pain: Secondary | ICD-10-CM | POA: Diagnosis not present

## 2019-08-02 DIAGNOSIS — M5442 Lumbago with sciatica, left side: Secondary | ICD-10-CM | POA: Diagnosis not present

## 2019-08-02 DIAGNOSIS — M9904 Segmental and somatic dysfunction of sacral region: Secondary | ICD-10-CM | POA: Diagnosis not present

## 2019-08-02 DIAGNOSIS — M9903 Segmental and somatic dysfunction of lumbar region: Secondary | ICD-10-CM | POA: Diagnosis not present

## 2019-08-02 DIAGNOSIS — M461 Sacroiliitis, not elsewhere classified: Secondary | ICD-10-CM | POA: Diagnosis not present

## 2019-08-03 DIAGNOSIS — M9904 Segmental and somatic dysfunction of sacral region: Secondary | ICD-10-CM | POA: Diagnosis not present

## 2019-08-03 DIAGNOSIS — M9903 Segmental and somatic dysfunction of lumbar region: Secondary | ICD-10-CM | POA: Diagnosis not present

## 2019-08-03 DIAGNOSIS — M5442 Lumbago with sciatica, left side: Secondary | ICD-10-CM | POA: Diagnosis not present

## 2019-08-03 DIAGNOSIS — M545 Low back pain: Secondary | ICD-10-CM | POA: Diagnosis not present

## 2019-08-03 DIAGNOSIS — M461 Sacroiliitis, not elsewhere classified: Secondary | ICD-10-CM | POA: Diagnosis not present

## 2019-08-09 DIAGNOSIS — M461 Sacroiliitis, not elsewhere classified: Secondary | ICD-10-CM | POA: Diagnosis not present

## 2019-08-09 DIAGNOSIS — M9903 Segmental and somatic dysfunction of lumbar region: Secondary | ICD-10-CM | POA: Diagnosis not present

## 2019-08-09 DIAGNOSIS — M545 Low back pain: Secondary | ICD-10-CM | POA: Diagnosis not present

## 2019-08-09 DIAGNOSIS — M9904 Segmental and somatic dysfunction of sacral region: Secondary | ICD-10-CM | POA: Diagnosis not present

## 2019-08-09 DIAGNOSIS — M5442 Lumbago with sciatica, left side: Secondary | ICD-10-CM | POA: Diagnosis not present

## 2019-08-12 DIAGNOSIS — M9903 Segmental and somatic dysfunction of lumbar region: Secondary | ICD-10-CM | POA: Diagnosis not present

## 2019-08-12 DIAGNOSIS — M545 Low back pain: Secondary | ICD-10-CM | POA: Diagnosis not present

## 2019-08-12 DIAGNOSIS — M9904 Segmental and somatic dysfunction of sacral region: Secondary | ICD-10-CM | POA: Diagnosis not present

## 2019-08-12 DIAGNOSIS — M5442 Lumbago with sciatica, left side: Secondary | ICD-10-CM | POA: Diagnosis not present

## 2019-08-12 DIAGNOSIS — M461 Sacroiliitis, not elsewhere classified: Secondary | ICD-10-CM | POA: Diagnosis not present

## 2019-08-16 DIAGNOSIS — M5442 Lumbago with sciatica, left side: Secondary | ICD-10-CM | POA: Diagnosis not present

## 2019-08-16 DIAGNOSIS — M9903 Segmental and somatic dysfunction of lumbar region: Secondary | ICD-10-CM | POA: Diagnosis not present

## 2019-08-16 DIAGNOSIS — M461 Sacroiliitis, not elsewhere classified: Secondary | ICD-10-CM | POA: Diagnosis not present

## 2019-08-16 DIAGNOSIS — M545 Low back pain: Secondary | ICD-10-CM | POA: Diagnosis not present

## 2019-08-16 DIAGNOSIS — M9904 Segmental and somatic dysfunction of sacral region: Secondary | ICD-10-CM | POA: Diagnosis not present

## 2019-08-17 ENCOUNTER — Ambulatory Visit (INDEPENDENT_AMBULATORY_CARE_PROVIDER_SITE_OTHER): Payer: PPO | Admitting: Family Medicine

## 2019-08-17 ENCOUNTER — Other Ambulatory Visit: Payer: Self-pay

## 2019-08-17 ENCOUNTER — Encounter: Payer: Self-pay | Admitting: Family Medicine

## 2019-08-17 DIAGNOSIS — H9193 Unspecified hearing loss, bilateral: Secondary | ICD-10-CM | POA: Diagnosis not present

## 2019-08-17 DIAGNOSIS — Z8673 Personal history of transient ischemic attack (TIA), and cerebral infarction without residual deficits: Secondary | ICD-10-CM

## 2019-08-17 DIAGNOSIS — M8589 Other specified disorders of bone density and structure, multiple sites: Secondary | ICD-10-CM

## 2019-08-17 DIAGNOSIS — I700 Atherosclerosis of aorta: Secondary | ICD-10-CM

## 2019-08-17 DIAGNOSIS — E782 Mixed hyperlipidemia: Secondary | ICD-10-CM

## 2019-08-17 DIAGNOSIS — Z9989 Dependence on other enabling machines and devices: Secondary | ICD-10-CM | POA: Diagnosis not present

## 2019-08-17 DIAGNOSIS — G4733 Obstructive sleep apnea (adult) (pediatric): Secondary | ICD-10-CM | POA: Diagnosis not present

## 2019-08-17 DIAGNOSIS — E041 Nontoxic single thyroid nodule: Secondary | ICD-10-CM

## 2019-08-17 DIAGNOSIS — I6523 Occlusion and stenosis of bilateral carotid arteries: Secondary | ICD-10-CM

## 2019-08-17 DIAGNOSIS — I10 Essential (primary) hypertension: Secondary | ICD-10-CM | POA: Diagnosis not present

## 2019-08-17 DIAGNOSIS — G3184 Mild cognitive impairment, so stated: Secondary | ICD-10-CM | POA: Diagnosis not present

## 2019-08-17 DIAGNOSIS — K219 Gastro-esophageal reflux disease without esophagitis: Secondary | ICD-10-CM

## 2019-08-17 MED ORDER — POTASSIUM 99 MG PO TABS: 1.0000 | ORAL_TABLET | ORAL | Status: DC

## 2019-08-17 NOTE — Assessment & Plan Note (Signed)
This is followed by Jefm Bryant endocrinology [facility]), she receives yearly reclast IV. Encouraged regular weight bearing activity, reviewed calcium recommendations.

## 2019-08-17 NOTE — Assessment & Plan Note (Signed)
Followed by endo.  

## 2019-08-17 NOTE — Assessment & Plan Note (Signed)
Managed with pepcid. Not using oral bisphosphonate.

## 2019-08-17 NOTE — Patient Instructions (Addendum)
Start monitoring blood pressures at home, let me know if consistently high to restart amlodipine.  Nice to meet you! Return in 2-3 months for physical.  You are doing well today Happy birthday!

## 2019-08-17 NOTE — Assessment & Plan Note (Signed)
Continues crestor.  

## 2019-08-17 NOTE — Assessment & Plan Note (Signed)
Stable period on aricept 10mg  daily. She sees neurology Dr Melrose Nakayama.

## 2019-08-17 NOTE — Assessment & Plan Note (Signed)
?  microembolic. Continue statin. Anticipate this is why she is on full dose aspirin. Continue.

## 2019-08-17 NOTE — Assessment & Plan Note (Signed)
BP elevated today. They will start monitoring at home and send me log via mychart to determine need to restart amlodipine.

## 2019-08-17 NOTE — Progress Notes (Signed)
This visit was conducted in person.  BP (!) 150/70 (BP Location: Right Arm, Patient Position: Sitting)   Pulse 70   Temp 98.4 F (36.9 C) (Temporal)   Ht 5' 2.5" (1.588 m)   Wt 136 lb 1 oz (61.7 kg)   SpO2 96%   BMI 24.49 kg/m   160s/70s on repeat  CC: new pt to establish Subjective:    Patient ID: Kathy Baldwin, female    DOB: 02/04/1946, 73 y.o.   MRN: [id number]  HPI: Kathy MallingJanice P Baldwin is a 73 y.o. female presenting on 08/17/2019 for New Patient (Initial Visit) (Pt accompanied by husband, Kathy Baldwin [temp, 97.6].)   I see pt's husband.  Last saw prior PCP (Dr Sherie DonLada) 10/2018 She did have medicare wellness visit 04/20/2019.   H/o HTN previously on amlodipine 2.5mg  daily. Off antihypertensive for ~the past year due to good control.   Sees endo for osteoporosis/osteopenia and thyroid nodules. Rece[facility]. Recent thyroid US reassuring.   Neuro - sees Dr Malvin JohnsPotter, recently started donepezil 10mg  daily for memory loss. Tolerating med well, some vivid dreams. Upcoming appt at end of month.   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 H/o R carotid plaque planned yearly monitoring.   OSA on CPAP at night started 2014.   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 Interim medical history since our last visit reviewed. Allergies and medications reviewed and updated. Outpatient Medications Prior to Visit  Medication Sig Dispense Refill  . aspirin EC 325 MG tablet Take 1 tablet (325 mg total) by mouth daily.    . calcium carbonate (OSCAL) 1500 (600 CA) MG TABS tablet Take 600 mg of elemental calcium by mouth 2 (two) times daily with a meal.    . COLLAGEN  PO Take by mouth.    . conjugated estrogens (PREMARIN) vaginal cream Use a pea-sized amount externally and internally twice a week at night 42.5 g 1  . Cyanocobalamin (B-12) 2500 MCG TABS Take 1 tablet by mouth daily. Pt taking 1000mcg tabs    . donepezil (ARICEPT) 10 MG tablet Take 10 mg by mouth at bedtime.     . famotidine (PEPCID) 20 MG tablet TAKE 1 TABLET BY MOUTH TWICE DAILY AS NEEDED FOR HEARTBURN AND INDIGESTION 180 tablet 3  . FIBER PO Take by mouth.    . fluticasone (FLONASE) 50 MCG/ACT nasal spray SHAKE LIQUID AND USE 2 SPRAYS IN EACH NOSTRIL DAILY 16 g 11  . MAGNESIUM PO Take by mouth every other day. Takes 2 tablets every other morning    . Multiple Vitamin (MULTIVITAMIN) tablet Take 1 tablet by mouth daily.    Marland Kitchen. PEDIASURE/FIBER (PEDIASURE/FIBER) LIQD Take 10 mLs by mouth.    . polyethylene glycol (MIRALAX / GLYCOLAX) packet Take 17 g by mouth daily.    . rosuvastatin (CRESTOR) 10 MG tablet TAKE 1 TABLET(10 MG) BY MOUTH AT BEDTIME 90 tablet 1  . OVER THE COUNTER MEDICATION Take by mouth every other day. Potassium.  Alternates every other day with magnesium.     No facility-administered medications prior to visit.      Per HPI unless specifically indicated in ROS section below Review of Systems Objective:    BP Marland Kitchen(!)  150/70 (BP Location: Right Arm, Patient Position: Sitting)   Pulse 70   Temp 98.4 F (36.9 C) (Temporal)   Ht 5' 2.5" (1.588 m)   Wt 136 lb 1 oz (61.7 kg)   SpO2 96%   BMI 24.49 kg/m   Wt Readings from Last 3 Encounters:  08/17/19 136 lb 1 oz (61.7 kg)  04/20/19 135 lb (61.2 kg)  10/28/18 137 lb (62.1 kg)    Physical Exam Vitals signs and nursing note reviewed.  Constitutional:      Appearance: Normal appearance. She is not ill-appearing.  HENT:     Right Ear: Tympanic membrane, ear canal and external ear normal. There is no impacted cerumen.     Left Ear: Tympanic membrane, ear canal and external ear normal. There is no impacted cerumen.  Eyes:      Extraocular Movements: Extraocular movements intact.     Pupils: Pupils are equal, round, and reactive to light.  Cardiovascular:     Rate and Rhythm: Normal rate and regular rhythm.     Pulses: Normal pulses.     Heart sounds: Normal heart sounds. No murmur.  Pulmonary:     Effort: Pulmonary effort is normal. No respiratory distress.     Breath sounds: Normal breath sounds. No wheezing, rhonchi or rales.  Musculoskeletal:     Right lower leg: No edema.     Left lower leg: No edema.  Neurological:     Mental Status: She is alert.  Psychiatric:        Mood and Affect: Mood normal.        Behavior: Behavior normal.       Results for orders placed or performed in visit on 10/08/18  TSH  Result Value Ref Range   TSH 0.94 0.40 - 4.50 mIU/L  CBC  Result Value Ref Range   WBC 5.8 3.8 - 10.8 Thousand/uL   RBC 4.41 3.80 - 5.10 Million/uL   Hemoglobin 13.5 11.7 - 15.5 g/dL   HCT 98.3 38.2 - 50.5 %   MCV 90.2 80.0 - 100.0 fL   MCH 30.6 27.0 - 33.0 pg   MCHC 33.9 32.0 - 36.0 g/dL   RDW 39.7 67.3 - 41.9 %   Platelets 216 140 - 400 Thousand/uL   MPV 10.0 7.5 - 12.5 fL  COMPLETE METABOLIC PANEL WITH GFR  Result Value Ref Range   Glucose, Bld 78 65 - 139 mg/dL   BUN 15 7 - 25 mg/dL   Creat 3.79 0.24 - 0.97 mg/dL   GFR, Est Non African American 75 > OR = 60 mL/min/1.62m2   GFR, Est African American 87 > OR = 60 mL/min/1.61m2   BUN/Creatinine Ratio NOT APPLICABLE 6 - 22 (calc)   Sodium 144 135 - 146 mmol/L   Potassium 4.3 3.5 - 5.3 mmol/L   Chloride 107 98 - 110 mmol/L   CO2 27 20 - 32 mmol/L   Calcium 10.4 8.6 - 10.4 mg/dL   Total Protein 7.1 6.1 - 8.1 g/dL   Albumin 4.2 3.6 - 5.1 g/dL   Globulin 2.9 1.9 - 3.7 g/dL (calc)   AG Ratio 1.4 1.0 - 2.5 (calc)   Total Bilirubin 1.2 0.2 - 1.2 mg/dL   Alkaline phosphatase (APISO) 58 33 - 130 U/L   AST 28 10 - 35 U/L   ALT 17 6 - 29 U/L  Lipid panel  Result Value Ref Range   Cholesterol 159 <200 mg/dL   HDL 81 >35 mg/dL  Triglycerides 50 <150 mg/dL   LDL Cholesterol (Calc) 66 mg/dL (calc)   Total CHOL/HDL Ratio 2.0 <5.0 (calc)   Non-HDL Cholesterol (Calc) 78 <130 mg/dL (calc)  VITAMIN D 25 Hydroxy (Vit-D Deficiency, Fractures)  Result Value Ref Range   Vit D, 25-Hydroxy 50 30 - 100 ng/mL  Vitamin B12  Result Value Ref Range   Vitamin B-12 >2,000 (H) 200 - 1,100 pg/mL   Assessment & Plan:  This visit occurred during the SARS-CoV-2 public health emergency.  Safety protocols were in place, including screening questions prior to the visit, additional usage of staff PPE, and extensive cleaning of exam room while observing appropriate contact time as indicated for disinfecting solutions.   Problem List Items Addressed This Visit    Thyroid nodule    Followed by endo.      Osteopenia    This is followed by [facility] endocrinology [facility]), she receives yearly reclast IV. Encouraged regular weight bearing activity, reviewed calcium recommendations.       OSA on CPAP    Compliant with this. Previously managed by PCP with setting of auto mode 6-12 cmH2O.       MCI (mild cognitive impairment) with memory loss    Stable period on aricept 10mg  daily. She sees neurology Dr Melrose Nakayama.       Hypertension    BP elevated today. They will start monitoring at home and send me log via mychart to determine need to restart amlodipine.      Hyperlipidemia    Continues crestor.       History of cerebellar stroke    ?microembolic. Continue statin. Anticipate this is why she is on full dose aspirin. Continue.       GERD (gastroesophageal reflux disease)    Managed with pepcid. Not using oral bisphosphonate.       Decreased hearing, bilateral   Carotid stenosis, bilateral    Has seen [facility], rec yearly [place]. Last done 10/2018      Aortic atherosclerosis (HCC)    Continue aspirin, statin          Meds ordered this encounter  Medications  . Potassium 99 MG TABS    Sig: Take 1 tablet (99 mg total) by mouth every  other day.   No orders of the defined types were placed in this encounter.  Patient Instructions  Start monitoring blood pressures at home, let me know if consistently high to restart amlodipine.  Nice to meet you! Return in 2-3 months for physical.  You are doing well today Happy birthday!   Follow up plan: Return in about 3 months (around 11/15/2019) for annual exam, prior fasting for blood work.  Ria Bush, MD

## 2019-08-17 NOTE — Assessment & Plan Note (Signed)
Continue aspirin, statin.  

## 2019-08-17 NOTE — Assessment & Plan Note (Signed)
Has seen [facility], rec yearly [place]. Last done 10/2018

## 2019-08-17 NOTE — Assessment & Plan Note (Deleted)
This is followed by [facility] ([facility]), she receives yearly reclast IV. Encouraged regular weight bearing activity, reviewed calcium recommendations.  

## 2019-08-17 NOTE — Assessment & Plan Note (Signed)
Compliant with this. Previously managed by PCP with setting of auto mode 6-12 cmH2O.

## 2019-08-18 ENCOUNTER — Encounter: Payer: Self-pay | Admitting: Family Medicine

## 2019-08-18 DIAGNOSIS — R2242 Localized swelling, mass and lump, left lower limb: Secondary | ICD-10-CM | POA: Diagnosis not present

## 2019-08-19 DIAGNOSIS — M545 Low back pain: Secondary | ICD-10-CM | POA: Diagnosis not present

## 2019-08-19 DIAGNOSIS — M9904 Segmental and somatic dysfunction of sacral region: Secondary | ICD-10-CM | POA: Diagnosis not present

## 2019-08-19 DIAGNOSIS — M461 Sacroiliitis, not elsewhere classified: Secondary | ICD-10-CM | POA: Diagnosis not present

## 2019-08-19 DIAGNOSIS — M9903 Segmental and somatic dysfunction of lumbar region: Secondary | ICD-10-CM | POA: Diagnosis not present

## 2019-08-19 DIAGNOSIS — M5442 Lumbago with sciatica, left side: Secondary | ICD-10-CM | POA: Diagnosis not present

## 2019-08-19 MED ORDER — AMLODIPINE BESYLATE 2.5 MG PO TABS: 2.5000 mg | ORAL_TABLET | Freq: Every day | ORAL | 3 refills | Status: DC

## 2019-08-23 DIAGNOSIS — M545 Low back pain: Secondary | ICD-10-CM | POA: Diagnosis not present

## 2019-08-23 DIAGNOSIS — M461 Sacroiliitis, not elsewhere classified: Secondary | ICD-10-CM | POA: Diagnosis not present

## 2019-08-23 DIAGNOSIS — M9904 Segmental and somatic dysfunction of sacral region: Secondary | ICD-10-CM | POA: Diagnosis not present

## 2019-08-23 DIAGNOSIS — M5442 Lumbago with sciatica, left side: Secondary | ICD-10-CM | POA: Diagnosis not present

## 2019-08-23 DIAGNOSIS — M9903 Segmental and somatic dysfunction of lumbar region: Secondary | ICD-10-CM | POA: Diagnosis not present

## 2019-08-24 DIAGNOSIS — R413 Other amnesia: Secondary | ICD-10-CM | POA: Diagnosis not present

## 2019-08-30 DIAGNOSIS — M545 Low back pain: Secondary | ICD-10-CM | POA: Diagnosis not present

## 2019-08-30 DIAGNOSIS — M461 Sacroiliitis, not elsewhere classified: Secondary | ICD-10-CM | POA: Diagnosis not present

## 2019-08-30 DIAGNOSIS — M9903 Segmental and somatic dysfunction of lumbar region: Secondary | ICD-10-CM | POA: Diagnosis not present

## 2019-08-30 DIAGNOSIS — M5442 Lumbago with sciatica, left side: Secondary | ICD-10-CM | POA: Diagnosis not present

## 2019-08-30 DIAGNOSIS — M9904 Segmental and somatic dysfunction of sacral region: Secondary | ICD-10-CM | POA: Diagnosis not present

## 2019-09-24 DIAGNOSIS — G4733 Obstructive sleep apnea (adult) (pediatric): Secondary | ICD-10-CM | POA: Diagnosis not present

## 2019-09-27 DIAGNOSIS — M461 Sacroiliitis, not elsewhere classified: Secondary | ICD-10-CM | POA: Diagnosis not present

## 2019-09-27 DIAGNOSIS — M545 Low back pain: Secondary | ICD-10-CM | POA: Diagnosis not present

## 2019-09-27 DIAGNOSIS — M9904 Segmental and somatic dysfunction of sacral region: Secondary | ICD-10-CM | POA: Diagnosis not present

## 2019-09-27 DIAGNOSIS — M5442 Lumbago with sciatica, left side: Secondary | ICD-10-CM | POA: Diagnosis not present

## 2019-09-27 DIAGNOSIS — M9903 Segmental and somatic dysfunction of lumbar region: Secondary | ICD-10-CM | POA: Diagnosis not present

## 2019-10-21 ENCOUNTER — Telehealth: Payer: Self-pay | Admitting: Family Medicine

## 2019-10-21 NOTE — Chronic Care Management (AMB) (Signed)
  Chronic Care Management   Note  10/21/2019 Name: KIONI STAHL MRN: [id number] DOB: 02-21-1946  Verne Spurr Pinn is a 74 y.o. year old female who is a primary care patient of Ria Bush, MD. I reached out to [place] by phone today in response to a referral sent by Ms. Verne Spurr Amsden's PCP, Ria Bush, MD.   Ms. Lundahl was given information about Chronic Care Management services today including:  1. CCM service includes personalized support from designated clinical staff supervised by her physician, including individualized plan of care and coordination with other care providers 2. 24/7 contact phone numbers for assistance for urgent and routine care needs. 3. Service will only be billed when office clinical staff spend 20 minutes or more in a month to coordinate care. 4. Only one practitioner may furnish and bill the service in a calendar month. 5. The patient may stop CCM services at any time (effective at the end of the month) by phone call to the office staff. 6. The patient will be responsible for cost sharing (co-pay) of up to 20% of the service fee (after annual deductible is met).  Patient agreed to services and verbal consent obtained.   Follow up plan:   Raynicia Dukes [facility] Scheduler

## 2019-10-25 DIAGNOSIS — M9904 Segmental and somatic dysfunction of sacral region: Secondary | ICD-10-CM | POA: Diagnosis not present

## 2019-10-25 DIAGNOSIS — M461 Sacroiliitis, not elsewhere classified: Secondary | ICD-10-CM | POA: Diagnosis not present

## 2019-10-25 DIAGNOSIS — M5442 Lumbago with sciatica, left side: Secondary | ICD-10-CM | POA: Diagnosis not present

## 2019-10-25 DIAGNOSIS — M545 Low back pain: Secondary | ICD-10-CM | POA: Diagnosis not present

## 2019-10-25 DIAGNOSIS — M9903 Segmental and somatic dysfunction of lumbar region: Secondary | ICD-10-CM | POA: Diagnosis not present

## 2019-10-26 ENCOUNTER — Telehealth: Payer: Self-pay

## 2019-10-26 DIAGNOSIS — K219 Gastro-esophageal reflux disease without esophagitis: Secondary | ICD-10-CM

## 2019-10-26 DIAGNOSIS — I10 Essential (primary) hypertension: Secondary | ICD-10-CM

## 2019-10-26 NOTE — Telephone Encounter (Signed)
I would like to request a referral for Kathy Baldwin to chronic care management pharmacy services focusing on the following conditions:   Essential hypertension, benign  [I10]  GERD [K21.9]  Phil Dopp, PharmD Clinical Pharmacist [facility] [phone]

## 2019-10-27 ENCOUNTER — Ambulatory Visit: Payer: PPO

## 2019-10-27 ENCOUNTER — Other Ambulatory Visit: Payer: Self-pay

## 2019-10-27 DIAGNOSIS — I10 Essential (primary) hypertension: Secondary | ICD-10-CM

## 2019-10-27 DIAGNOSIS — G4733 Obstructive sleep apnea (adult) (pediatric): Secondary | ICD-10-CM

## 2019-10-27 DIAGNOSIS — E782 Mixed hyperlipidemia: Secondary | ICD-10-CM

## 2019-10-27 DIAGNOSIS — K219 Gastro-esophageal reflux disease without esophagitis: Secondary | ICD-10-CM

## 2019-10-27 DIAGNOSIS — J309 Allergic rhinitis, unspecified: Secondary | ICD-10-CM

## 2019-10-27 DIAGNOSIS — M8589 Other specified disorders of bone density and structure, multiple sites: Secondary | ICD-10-CM

## 2019-10-27 DIAGNOSIS — G3184 Mild cognitive impairment, so stated: Secondary | ICD-10-CM

## 2019-10-27 DIAGNOSIS — N952 Postmenopausal atrophic vaginitis: Secondary | ICD-10-CM

## 2019-10-27 DIAGNOSIS — I700 Atherosclerosis of aorta: Secondary | ICD-10-CM

## 2019-10-27 NOTE — Chronic Care Management (AMB) (Signed)
Chronic Care Management Pharmacy  Name: Kathy Baldwin  MRN: [id number] DOB: 16-Apr-1946  Chief Complaint/ HPI  Kathy Baldwin,  74 y.o., female presents for their Initial CCM visit with the clinical pharmacist via telephone.  PCP : [name], MD  Specialists:  Theora Master, neurology  Midge Minium, gastroenterology  Carlena Sax, endocrinology  Their chronic conditions include: hypertension, hyperlipidemia,  aortic atherosclerosis, GERD, mild cognitive impairment, osteopenia, vaginal atrophy  Patient concerns: worried about adverse effects with famotidine, blurred vision, dry eyes, decreased libido --> pt has been on famotidine for a long time and very effective for her acid reflux, these side effects are unlikely caused by famotidine, continue current therapy  Office Visits:  08/17/19: Sharen Hones initial visit - resume amlodipine 2.5 mg  Consult Visit:  08/24/19: Dot Lanes, Neurology - recommended brain stimulating activities, exercise, refill Aricept, use pill box, SLUMS: 20/30, continue current medications  Allergies  Allergen Reactions  . Cabbage Other (See Comments)    Hypersensitivity on allergy testing  . Chocolate Other (See Comments)    Hypersensitivity on allergy testing  . Statins Other (See Comments)    Zocor and Pravastatin--leg cramps Other reaction(s): Other (See Comments) Zocor and Pravastatin--leg cramps   Medications: Outpatient Encounter Medications as of 10/27/2019  Medication Sig  . amLODipine (NORVASC) 2.5 MG tablet Take 1 tablet (2.5 mg total) by mouth daily.  Marland Kitchen aspirin EC 325 MG tablet Take 1 tablet (325 mg total) by mouth daily.  . calcium carbonate (OSCAL) 1500 (600 CA) MG TABS tablet Take 600 mg of elemental calcium by mouth 2 (two) times daily with a meal.  . COLLAGEN PO Take by mouth.  . conjugated estrogens (PREMARIN) vaginal cream Use a pea-sized amount externally and internally twice a week at night  .  Cyanocobalamin (B-12) 2500 MCG TABS Take 1 tablet by mouth daily. Pt taking tabs  . donepezil (ARICEPT) 10 MG tablet Take 10 mg by mouth at bedtime.   . famotidine (PEPCID) 20 MG tablet TAKE 1 TABLET BY MOUTH TWICE DAILY AS NEEDED FOR HEARTBURN AND INDIGESTION  . FIBER PO Take by mouth.  . fluticasone (FLONASE) 50 MCG/ACT nasal spray SHAKE LIQUID AND USE 2 SPRAYS IN EACH NOSTRIL DAILY  . MAGNESIUM PO Take by mouth every other day. Takes 2 tablets every other morning  . Multiple Vitamin (MULTIVITAMIN) tablet Take 1 tablet by mouth daily.  Marland Kitchen PEDIASURE/FIBER (PEDIASURE/FIBER) LIQD Take 10 mLs by mouth.  . polyethylene glycol (MIRALAX / GLYCOLAX) packet Take 17 g by mouth daily.  . Potassium 99 MG TABS Take 1 tablet (99 mg total) by mouth every other day.  . rosuvastatin (CRESTOR) 10 MG tablet TAKE 1 TABLET(10 MG) BY MOUTH AT BEDTIME   No facility-administered encounter medications on file as of 10/27/2019.   Current Diagnosis/Assessment: Goals    . DIET - INCREASE WATER INTAKE     Recommend to drink at least 6-8 8oz glasses of water per day.    Marland Kitchen Pharmacy Care Plan     Current Barriers:  . Chronic Disease Management support, education, and care coordination needs related to hypertension, hyperlipidemia,  . aortic atherosclerosis, GERD, mild cognitive impairment, osteopenia  Pharmacist Clinical Goal(s):  Marland Kitchen Maintain blood pressure within goal of less than 140/90 mmHg. Monitor blood pressure every 1-2 weeks. . Control acid reflux symptoms. Recommend calcium carbonate for breakthrough reflux symptoms as needed. . Control allergy symptoms while limiting medication use. May reduce Flonase to 1 spray in each nostril daily while  allergy symptoms are well controlled. . Reduce duplicate/excess vitamins. Recommend discontinuing potassium and magnesium supplements. Reduce vitamin B12 to 1000 mcg daily. Continue multivitamin and vitamin for hair, skin, nails.  Interventions: . Comprehensive  medication review performed.  Patient Self Care Activities:  . Takes medications as prescribed . Self monitors blood pressure  Initial goal documentation       Hypertension   Office blood pressures are  BP Readings from Last 3 Encounters:  08/17/19 (!) 150/70  04/20/19 (!) 159/90  10/28/18 125/67   CMP Latest Ref Rng & Units 10/08/2018 04/29/2018 12/04/2015  Glucose 65 - 139 mg/dL 78 - 97  BUN 7 - 25 mg/dL 15 - 11  Creatinine 3.81 - 0.93 mg/dL 8.29 9.37 1.69  Sodium 135 - 146 mmol/L 144 - 143  Potassium 3.5 - 5.3 mmol/L 4.3 - 4.4  Chloride 98 - 110 mmol/L 107 - 105  CO2 20 - 32 mmol/L 27 - 28  Calcium 8.6 - 10.4 mg/dL 67.8 - 9.8  Total Protein 6.1 - 8.1 g/dL 7.1 - 6.5  Total Bilirubin 0.2 - 1.2 mg/dL 1.2 - 0.9  Alkaline Phos 39 - 117 IU/L - - 89  AST 10 - 35 U/L 28 - 22  ALT 6 - 29 U/L 17 - 17   Patient has failed these meds in the past: none CPAP use: every night  Patient checks BP at home: daily until about a week ago Patient home BP readings are ranging: 120s/60s mmHg  Patient is currently controlled on the following medications:   Amlodipine 2.5 mg - take one tablet daily  We discussed: amlodipine restarted on 08/17/19 due to BP elevations; check twice monthly for the next few months   Plan: Continue current medications; Monitor blood pressure every 1-2 weeks.  Hyperlipidemia/CAD   Lipid Panel     Component Value Date/Time   CHOL 159 10/08/2018 1156   CHOL 171 12/04/2015 0851   TRIG 50 10/08/2018 1156   HDL 81 10/08/2018 1156   HDL 72 12/04/2015 0851   CHOLHDL 2.0 10/08/2018 1156   LDLCALC 66 10/08/2018 1156   LABVLDL 14 12/04/2015 0851    LDL goal < 70  Patient has failed these meds in past: multiple statins  Patient is currently controlled on the following medications:  Rosuvastatin 10 mg - once daily  Aspirin 325 mg - once daily (dosing per neuro)  Plan: Continue current medications  GERD  Followed by Dr. Servando Snare, gastroenterology Patient has  failed these meds in past: omeprazole (not effective)  Symptoms: some breakthrough reflux  Patient is currently controlled on the following medications:   Famotidine 20 mg - 1 tablet twice daily   We discussed:  Eats early in the evening to avoid nighttime reflux symptoms, recommended calcium carbonate for breakthrough reflux symptoms as needed; differentiated between gas symptoms and acid reflux symptoms   Plan: Continue current medications; Recommend calcium carbonate for breakthrough reflux symptoms as needed.   Mild Cognitive Impairment   Followed by neurology  Patient has failed these meds in past: none Patient is currently controlled on the following medications:   Donepezil 10 mg - take 1 tablet daily at bedtime  We discussed: exercise, stimulating activities, dash diet  Plan: Continue current medications  Allergic Rhinitis   Patient has failed these meds in past: none Patient is currently controlled on the following medications:   Flonase 50 mcg - 2 sprays in each nostril daily (morning)  We discussed:  If stable, may reduce to 1  spray daily  Plan: Continue current medications. Reduce to 1 spray in each nostril daily until Spring/allergy season.  Osteopenia  Followed by endocrinology, [facility] Vitamin D: (10/08/18) 50; Calcium WNL Patient is currently controlled on the following medications:   Reclast IV annually (last dose was July 2020, Endocrinology office)   Calcium/Vitamin D 600 mg/800 IU - 2 capsules daily We discussed:  Calcium goals 1000-1200 mg daily (including dietary intake), vitamin D 400-800 IU daily  Plan: Continue current medications   Vaginal Atrophy   Patient is currently controlled on the following medications:   Premarin vaginal cream - twice weekly at night (started by previous PCP)  Plan: Continue current medications  OTC Supplement Review   Constipation/Bloating:  ClearLAX - 1 cap-full daily (constipation, bloating)  Liquid Fiber - 1  tsp daily (constipation)  Misc Vitamins/Electrolytes:  [place] Collagen Support for hair, skin, nails (brittle fingernails/hair)  B12 5000 mcg SL - once daily (B12 level > 2000) --> reduce to 1000 mcg daily   Multivitamin (Berkley Jenson brand Adult 50+, has potassium, vitamin D1000 IU Calcium 220 mg, B12 25 mcg)   Potassium gluconate 595 mg (99 mg) - every other daily (started 2 months ago) - started for muscle cramps  Magnesium citrate 250 mg - 2 tablet every other day (for muscle cramps)  Plan: Reduce duplicate/excess vitamins. Recommend discontinuing potassium and magnesium supplements. Reduce vitamin B12 to 1000 mcg daily. Continue multivitamin and vitamin for hair, skin, nails.   Medication Management  Pharmacy: [facility] -> would like to switch to [facility] pharmacy services for adherence packaging, 90 DS  Adherence: no concerns  Affordability: no concerns  Verbal consent obtained for [facility] enhanced pharmacy services (medication synchronization, adherence packaging, delivery coordination). A medication sync plan was created to allow patient to get all medications delivered once every 30 to 90 days per patient preference. Patient understands they have freedom to choose pharmacy and clinical pharmacist will coordinate care between all prescribers and [facility].  CCM Follow Up:  Wednesday, April 05, 2020 at 10:00 AM (telephone)  Debbora Dus, PharmD Clinical Pharmacist [facility] [phone]

## 2019-10-27 NOTE — Patient Instructions (Signed)
October 27, 2019  Dear Kathy Baldwin,  It was a pleasure meeting you during our initial appointment on October 27, 2019. Below is a summary of the goals we discussed and components of chronic care management. Please contact me anytime with questions or concerns.   Visit Information  Goals Addressed            This Visit's Progress   . DIET - INCREASE WATER INTAKE   On track    Recommend to drink at least 6-8 8oz glasses of water per day.    [facility] Care Plan       Current Barriers:  . Chronic Disease Management support, education, and care coordination needs related to hypertension, hyperlipidemia,  . aortic atherosclerosis, GERD, mild cognitive impairment, osteopenia  Pharmacist Clinical Goal(s):  Marland Kitchen Maintain blood pressure within goal of less than 140/90 mmHg. Monitor blood pressure every 1-2 weeks. . Control acid reflux symptoms. Recommend calcium carbonate for breakthrough reflux symptoms as needed. . Control allergy symptoms while limiting medication use. May reduce Flonase to 1 spray in each nostril daily while allergy symptoms are well controlled. . Reduce duplicate/excess vitamins. Recommend discontinuing potassium and magnesium supplements. Reduce vitamin B12 to 1000 mcg daily. Continue multivitamin and vitamin for hair, skin, nails.  Interventions: . Comprehensive medication review performed.  Patient Self Care Activities:  . Takes medications as prescribed . Self monitors blood pressure  Initial goal documentation        Kathy Baldwin was given information about Chronic Care Management services today including:  1. CCM service includes personalized support from designated clinical staff supervised by her physician, including individualized plan of care and coordination with other care providers 2. 24/7 contact phone numbers for assistance for urgent and routine care needs. 3. Service will only be billed when office clinical staff spend 20 minutes or  more in a month to coordinate care. 4. Only one practitioner may furnish and bill the service in a calendar month. 5. The patient may stop CCM services at any time (effective at the end of the month) by phone call to the office staff. 6. The patient will be responsible for cost sharing (co-pay) of up to 20% of the service fee (after annual deductible is met).  Patient agreed to services and verbal consent obtained.   Verbal consent obtained for [facility] enhanced pharmacy services (medication synchronization, adherence packaging, delivery coordination). A medication sync plan was created to allow patient to get all medications delivered once every 30 to 90 days per patient preference. Patient understands they have freedom to choose pharmacy and clinical pharmacist will coordinate care between all prescribers and [facility].  Telephone follow up appointment with pharmacy team member scheduled for: Wednesday, April 05, 2020 at 10:00 AM (telephone)  Debbora Dus, PharmD Clinical Pharmacist [facility] at [facility] [phone]  [place] stands for "Dietary Approaches to Stop Hypertension." The DASH eating plan is a healthy eating plan that has been shown to reduce high blood pressure (hypertension). It may also reduce your risk for type 2 diabetes, heart disease, and stroke. The DASH eating plan may also help with weight loss. What are tips for following this plan?  General guidelines  Avoid eating more than 2,300 mg (milligrams) of salt (sodium) a day. If you have hypertension, you may need to reduce your sodium intake to 1,500 mg a day.  Limit alcohol intake to no more than 1 drink a day for nonpregnant women and 2 drinks  a day for men. One drink equals 12 oz of beer, 5 oz of wine, or 1 oz of hard liquor.  Work with your health care provider to maintain a healthy body weight or to lose weight. Ask what an ideal weight is for you.  Get at least 30  minutes of exercise that causes your heart to beat faster (aerobic exercise) most days of the week. Activities may include walking, swimming, or biking.  Work with your health care provider or diet and nutrition specialist (dietitian) to adjust your eating plan to your individual calorie needs. Reading food labels   Check food labels for the amount of sodium per serving. Choose foods with less than 5 percent of the Daily Value of sodium. Generally, foods with less than 300 mg of sodium per serving fit into this eating plan.  To find whole grains, look for the word "whole" as the first word in the ingredient list. Shopping  Buy products labeled as "low-sodium" or "no salt added."  Buy fresh foods. Avoid canned foods and premade or frozen meals. Cooking  Avoid adding salt when cooking. Use salt-free seasonings or herbs instead of table salt or sea salt. Check with your health care provider or pharmacist before using salt substitutes.  Do not fry foods. Cook foods using healthy methods such as baking, boiling, grilling, and broiling instead.  Cook with heart-healthy oils, such as olive, canola, soybean, or sunflower oil. Meal planning  Eat a balanced diet that includes: ? 5 or more servings of fruits and vegetables each day. At each meal, try to fill half of your plate with fruits and vegetables. ? Up to 6-8 servings of whole grains each day. ? Less than 6 oz of lean meat, poultry, or fish each day. A 3-oz serving of meat is about the same size as a deck of cards. One egg equals 1 oz. ? 2 servings of low-fat dairy each day. ? A serving of nuts, seeds, or beans 5 times each week. ? Heart-healthy fats. Healthy fats called Omega-3 fatty acids are found in foods such as flaxseeds and coldwater fish, like sardines, salmon, and mackerel.  Limit how much you eat of the following: ? Canned or prepackaged foods. ? Food that is high in trans fat, such as fried foods. ? Food that is high in  saturated fat, such as fatty meat. ? Sweets, desserts, sugary drinks, and other foods with added sugar. ? Full-fat dairy products.  Do not salt foods before eating.  Try to eat at least 2 vegetarian meals each week.  Eat more home-cooked food and less restaurant, buffet, and fast food.  When eating at a restaurant, ask that your food be prepared with less salt or no salt, if possible. What foods are recommended? The items listed may not be a complete list. Talk with your dietitian about what dietary choices are best for you. Grains Whole-grain or whole-wheat bread. Whole-grain or whole-wheat pasta. Brown rice. [place]. Bulgur. Whole-grain and low-sodium cereals. Pita bread. Low-fat, low-sodium crackers. Whole-wheat flour tortillas. Vegetables Fresh or frozen vegetables (raw, steamed, roasted, or grilled). Low-sodium or reduced-sodium tomato and vegetable juice. Low-sodium or reduced-sodium tomato sauce and tomato paste. Low-sodium or reduced-sodium canned vegetables. Fruits All fresh, dried, or frozen fruit. Canned fruit in natural juice (without added sugar). Meat and other protein foods Skinless chicken or [place]. Ground chicken or [place]. Pork with fat trimmed off. Fish and seafood. Egg whites. Dried beans, peas, or lentils. Unsalted nuts, nut butters, and  seeds. Unsalted canned beans. Lean cuts of beef with fat trimmed off. Low-sodium, lean deli meat. Dairy Low-fat (1%) or fat-free (skim) milk. Fat-free, low-fat, or reduced-fat cheeses. Nonfat, low-sodium ricotta or cottage cheese. Low-fat or nonfat yogurt. Low-fat, low-sodium cheese. Fats and oils Soft margarine without trans fats. Vegetable oil. Low-fat, reduced-fat, or light mayonnaise and salad dressings (reduced-sodium). Canola, safflower, olive, soybean, and sunflower oils. Avocado. Seasoning and other foods Herbs. Spices. Seasoning mixes without salt. Unsalted popcorn and pretzels. Fat-free sweets. What foods are not  recommended? The items listed may not be a complete list. Talk with your dietitian about what dietary choices are best for you. Grains Baked goods made with fat, such as croissants, muffins, or some breads. Dry pasta or rice meal packs. Vegetables Creamed or fried vegetables. Vegetables in a cheese sauce. Regular canned vegetables (not low-sodium or reduced-sodium). Regular canned tomato sauce and paste (not low-sodium or reduced-sodium). Regular tomato and vegetable juice (not low-sodium or reduced-sodium). Angie Fava. Olives. Fruits Canned fruit in a light or heavy syrup. Fried fruit. Fruit in cream or butter sauce. Meat and other protein foods Fatty cuts of meat. Ribs. Fried meat. Berniece Salines. Sausage. Bologna and other processed lunch meats. Salami. Fatback. Hotdogs. Bratwurst. Salted nuts and seeds. Canned beans with added salt. Canned or smoked fish. Whole eggs or egg yolks. Chicken or [place] with skin. Dairy Whole or 2% milk, cream, and half-and-half. Whole or full-fat cream cheese. Whole-fat or sweetened yogurt. Full-fat cheese. Nondairy creamers. Whipped toppings. Processed cheese and cheese spreads. Fats and oils Butter. Stick margarine. Lard. Shortening. Ghee. Bacon fat. Tropical oils, such as coconut, palm kernel, or palm oil. Seasoning and other foods Salted popcorn and pretzels. Onion salt, garlic salt, seasoned salt, table salt, and sea salt. Worcestershire sauce. Tartar sauce. Barbecue sauce. Teriyaki sauce. Soy sauce, including reduced-sodium. Steak sauce. Canned and packaged gravies. Fish sauce. Oyster sauce. Cocktail sauce. Horseradish that you find on the shelf. Ketchup. Mustard. Meat flavorings and tenderizers. Bouillon cubes. Hot sauce and Tabasco sauce. Premade or packaged marinades. Premade or packaged taco seasonings. Relishes. Regular salad dressings. Where to find more information:  National Heart, Lung, and [place]: https://wilson-eaton.com/  American Heart Association:  www.heart.org Summary  The DASH eating plan is a healthy eating plan that has been shown to reduce high blood pressure (hypertension). It may also reduce your risk for type 2 diabetes, heart disease, and stroke.  With the DASH eating plan, you should limit salt (sodium) intake to 2,300 mg a day. If you have hypertension, you may need to reduce your sodium intake to 1,500 mg a day.  When on the DASH eating plan, aim to eat more fresh fruits and vegetables, whole grains, lean proteins, low-fat dairy, and heart-healthy fats.  Work with your health care provider or diet and nutrition specialist (dietitian) to adjust your eating plan to your individual calorie needs. This information is not intended to replace advice given to you by your health care provider. Make sure you discuss any questions you have with your health care provider. Document Revised: 08/08/2017 Document Reviewed: 08/19/2016 Elsevier Patient Education  2020 [employer].

## 2019-11-22 DIAGNOSIS — M9903 Segmental and somatic dysfunction of lumbar region: Secondary | ICD-10-CM | POA: Diagnosis not present

## 2019-11-22 DIAGNOSIS — M545 Low back pain: Secondary | ICD-10-CM | POA: Diagnosis not present

## 2019-11-22 DIAGNOSIS — M461 Sacroiliitis, not elsewhere classified: Secondary | ICD-10-CM | POA: Diagnosis not present

## 2019-11-22 DIAGNOSIS — M5442 Lumbago with sciatica, left side: Secondary | ICD-10-CM | POA: Diagnosis not present

## 2019-11-22 DIAGNOSIS — M9904 Segmental and somatic dysfunction of sacral region: Secondary | ICD-10-CM | POA: Diagnosis not present

## 2019-12-07 ENCOUNTER — Other Ambulatory Visit: Payer: Self-pay

## 2019-12-07 MED ORDER — ROSUVASTATIN CALCIUM 10 MG PO TABS: ORAL_TABLET | ORAL | 3 refills | Status: DC

## 2019-12-07 NOTE — Telephone Encounter (Signed)
ERx 

## 2019-12-07 NOTE — Telephone Encounter (Signed)
Refill request for rosuvastatin 10 mg - send to [facility] please.  Thank you!  Phil Dopp, PharmD Clinical Pharmacist [facility] at [facility] [phone]

## 2019-12-07 NOTE — Telephone Encounter (Signed)
Last OV 08/17/19 Not filled by PCP.  Last filled 10/7/*20, #90. 1RF

## 2019-12-24 DIAGNOSIS — G4733 Obstructive sleep apnea (adult) (pediatric): Secondary | ICD-10-CM | POA: Diagnosis not present

## 2020-01-03 ENCOUNTER — Ambulatory Visit: Payer: Self-pay

## 2020-01-03 DIAGNOSIS — E782 Mixed hyperlipidemia: Secondary | ICD-10-CM

## 2020-01-03 DIAGNOSIS — I10 Essential (primary) hypertension: Secondary | ICD-10-CM

## 2020-01-03 NOTE — Chronic Care Management (AMB) (Signed)
Reviewed chart for medication changes ahead of medication coordination call.  No office visits, consults, or hospital visits since last CCM visit; No medication changes  BP Readings from Last 3 Encounters:  08/17/19 (!) 150/70  04/20/19 (!) 159/90  10/28/18 125/67    BP home monitoring: 120s/60s (weekly check)  Patient obtains medications through Adherence Packaging  90 Days   Called patient and reviewed medications and coordinated delivery.  This delivery to include: . Amlodipine 2.5 mg one daily - breakfast  . Donepezil 10 mg one daily - bedtime . Famotidine 20 mg one daily - breakfast and evening meal . Rosuvastatin 10 mg one daily - bedtime  Patient declined the following medications:  Picks up all OTC items from [facility] (aspirin 325 mg daily, B12 1000 mcg daily, multivitamin, MiraLAX, liquid fiber, calcium/vitamin D 600 mg/800 IU 2 tabs daily, [place] Collagen Support - 1 daily, Flonase - 1-2 sprays in each nostril daily)  Currently still has enough Premarin to last until next delivery  Patient has refills for all needed medications.  Confirmed delivery date of 01/07/2020, advised patient that pharmacy will contact them the morning of delivery. Patient denies additional concerns today.  Phil Dopp, PharmD Clinical Pharmacist [facility] at [facility] [phone]

## 2020-01-04 NOTE — Progress Notes (Signed)
I have collaborated with the care management provider regarding care management and care coordination activities outlined in this encounter and have reviewed this encounter including documentation in the note and care plan. I am certifying that I agree with the content of this note and encounter as supervising physician.  

## 2020-01-19 DIAGNOSIS — R2242 Localized swelling, mass and lump, left lower limb: Secondary | ICD-10-CM | POA: Diagnosis not present

## 2020-02-01 ENCOUNTER — Telehealth: Payer: Self-pay | Admitting: Family Medicine

## 2020-02-01 NOTE — Progress Notes (Signed)
Error message.  This note is not being shared with the patient for the following reason: To respect privacy (The patient or proxy has requested that the information not be shared).  Lynnae January [facility]

## 2020-02-10 DIAGNOSIS — R2242 Localized swelling, mass and lump, left lower limb: Secondary | ICD-10-CM | POA: Diagnosis not present

## 2020-02-10 DIAGNOSIS — M6740 Ganglion, unspecified site: Secondary | ICD-10-CM | POA: Diagnosis not present

## 2020-03-02 IMAGING — MG MM DIGITAL SCREENING BILAT W/ TOMO W/ CAD
6 of 10 series · 6 of 30 positions shown · non-contrast
Comparison: Previous exam(s).

CLINICAL DATA: Screening.

EXAM:
DIGITAL SCREENING BILATERAL MAMMOGRAM WITH TOMO AND CAD

[L MLO synth-2D (1 of 2)]
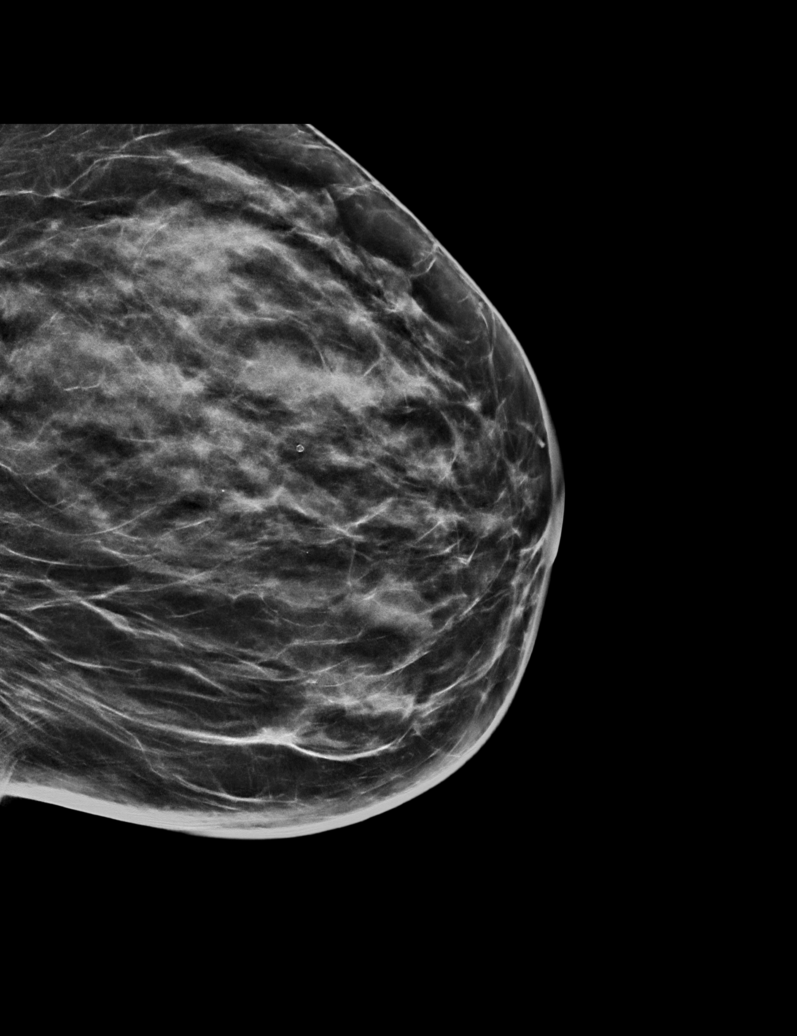

[L MLO synth-2D (2 of 2)]
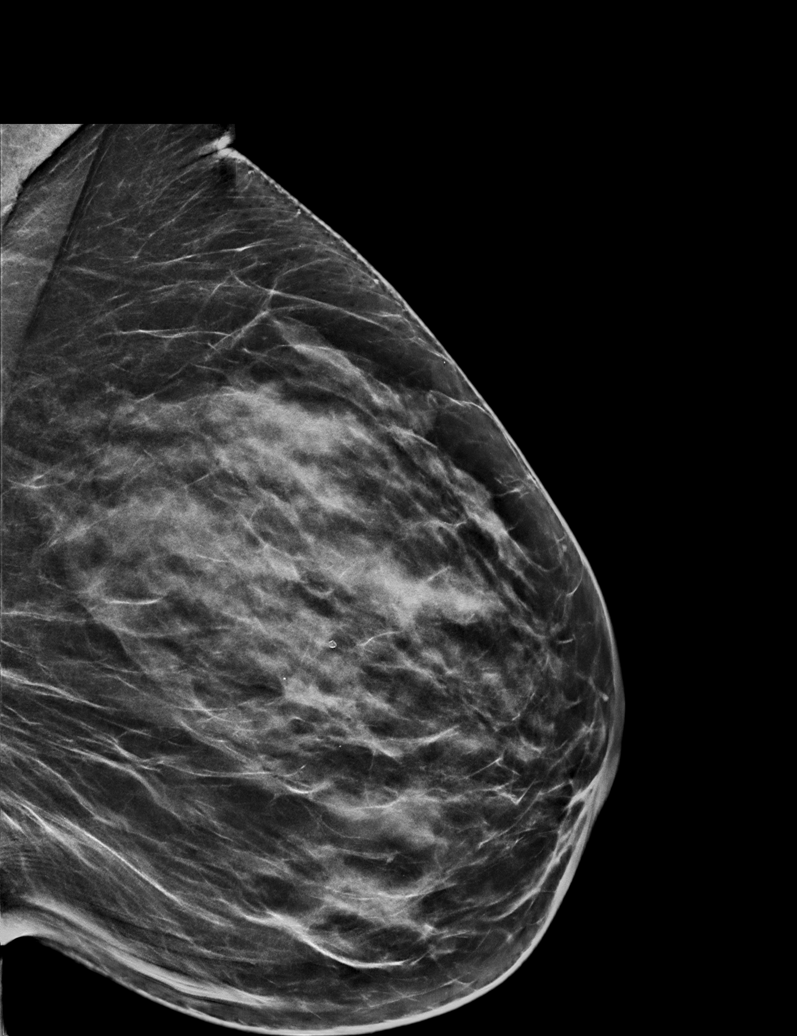

[R CC synth-2D]
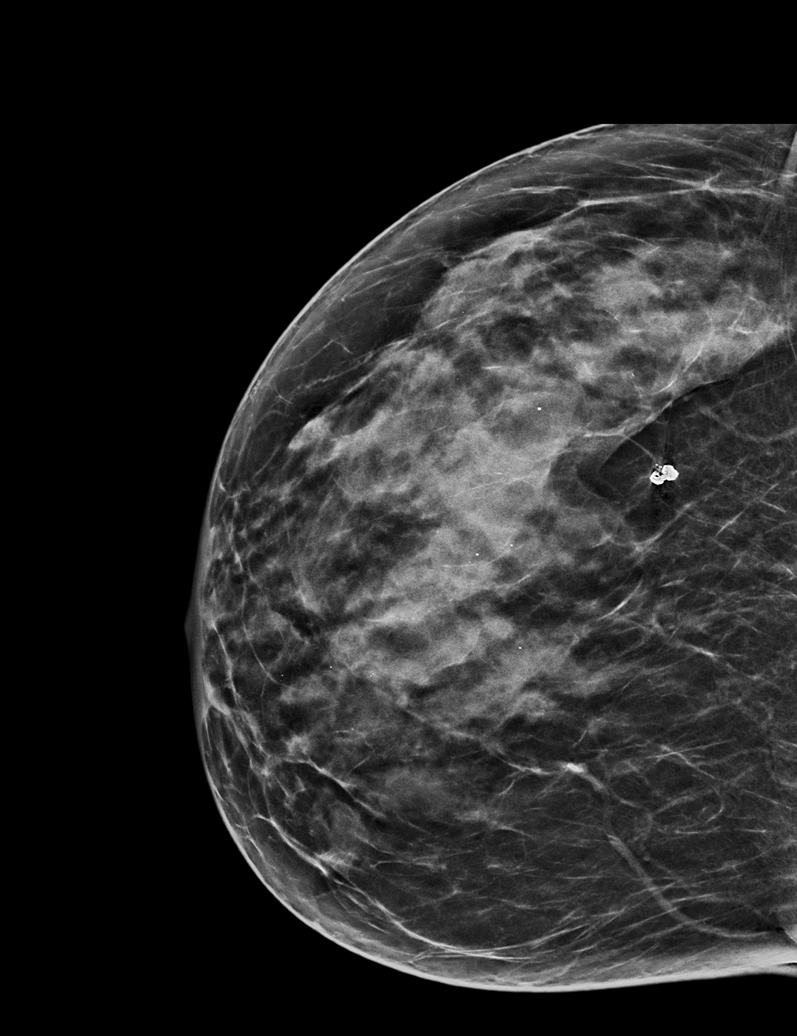

[R MLO synth-2D]
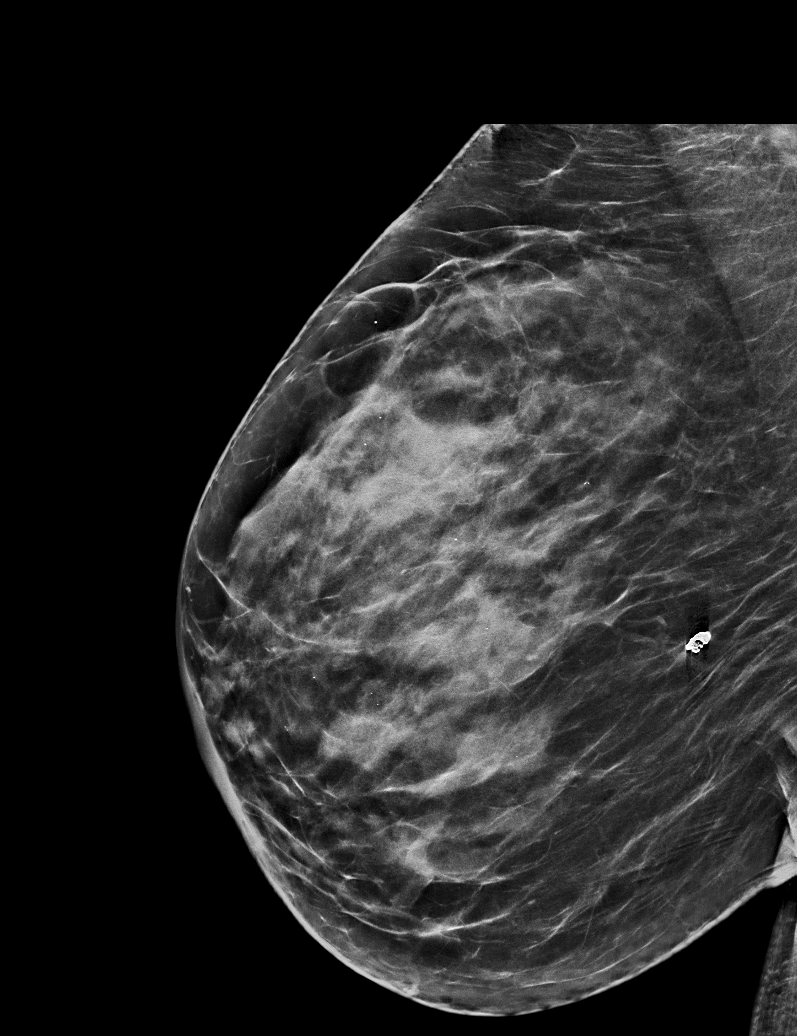

[L CC synth-2D]
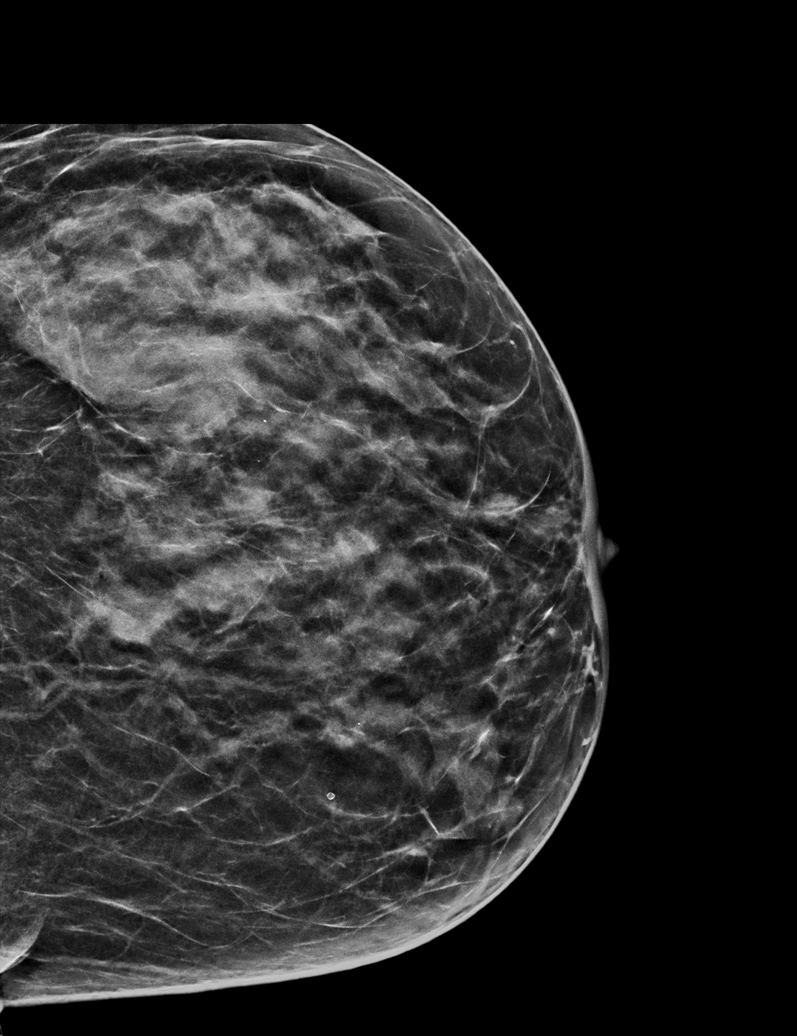

[R MLO tomo · tomo slice 31/62.0]
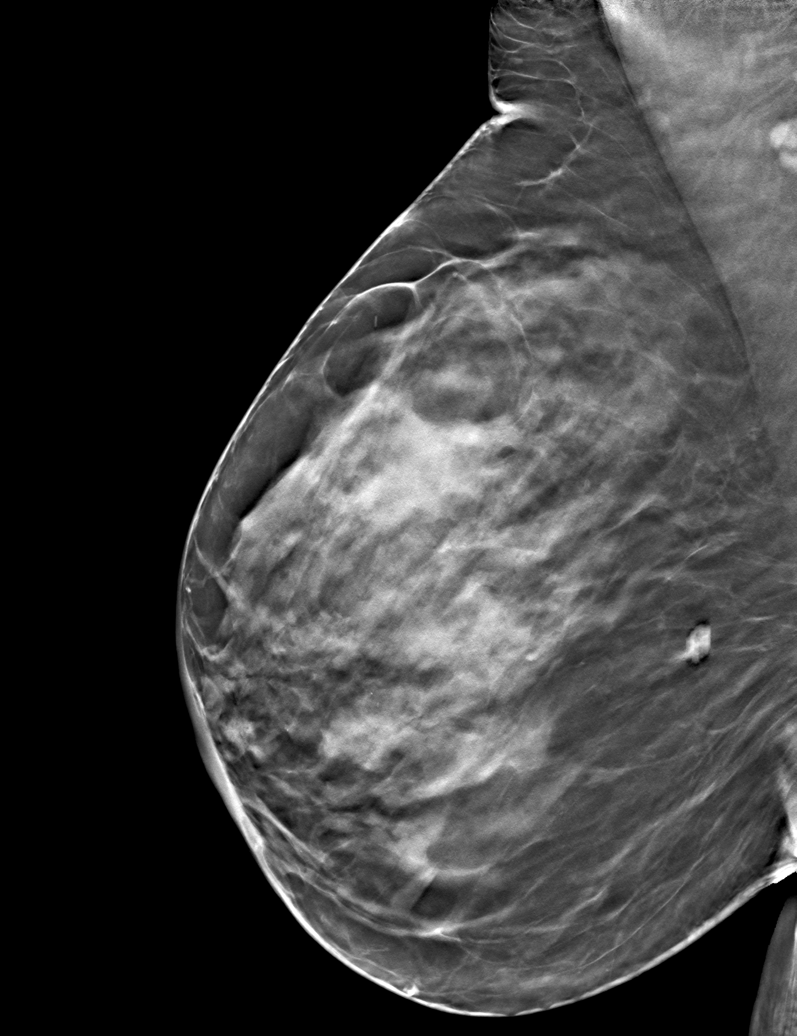

[6 of 30 positions shown; findings below may reference images not displayed]

ACR Breast Density Category c: The breast tissue is heterogeneously
dense, which may obscure small masses.
FINDINGS: There are no findings suspicious for malignancy. Images were
processed with CAD.
IMPRESSION: No mammographic evidence of malignancy. A result letter of this
screening mammogram will be mailed directly to the patient.

RECOMMENDATION:
Screening mammogram in one year. (Code:[id number])

BI-RADS CATEGORY  1: Negative.

## 2020-03-15 ENCOUNTER — Other Ambulatory Visit: Payer: Self-pay | Admitting: Internal Medicine

## 2020-03-15 DIAGNOSIS — M8589 Other specified disorders of bone density and structure, multiple sites: Secondary | ICD-10-CM

## 2020-03-24 DIAGNOSIS — R399 Unspecified symptoms and signs involving the genitourinary system: Secondary | ICD-10-CM | POA: Diagnosis not present

## 2020-03-24 DIAGNOSIS — G4733 Obstructive sleep apnea (adult) (pediatric): Secondary | ICD-10-CM | POA: Diagnosis not present

## 2020-03-24 DIAGNOSIS — R413 Other amnesia: Secondary | ICD-10-CM | POA: Diagnosis not present

## 2020-03-27 ENCOUNTER — Telehealth: Payer: Self-pay

## 2020-03-27 ENCOUNTER — Other Ambulatory Visit: Payer: Self-pay

## 2020-03-27 NOTE — Telephone Encounter (Signed)
Filled

## 2020-03-27 NOTE — Telephone Encounter (Signed)
Patient requesting refill on famotidine 20 mg BID. She takes it twice every day and it comes in her adherence packaging. I believe it was last written by a previous PCP. If approved, please send to [facility].  Thank you,  Phil Dopp, PharmD Clinical Pharmacist [facility] at [facility] [phone]

## 2020-03-27 NOTE — Telephone Encounter (Signed)
Received faxed Initial Prescription Letter of Medical Necessity from [employer].    Placed order in Dr. Timoteo Expose box.

## 2020-03-28 ENCOUNTER — Ambulatory Visit: Payer: Self-pay

## 2020-03-28 DIAGNOSIS — I10 Essential (primary) hypertension: Secondary | ICD-10-CM

## 2020-03-28 DIAGNOSIS — E782 Mixed hyperlipidemia: Secondary | ICD-10-CM

## 2020-03-28 MED ORDER — FAMOTIDINE 20 MG PO TABS: 20.0000 mg | ORAL_TABLET | Freq: Two times a day (BID) | ORAL | 3 refills | Status: DC | PRN

## 2020-03-28 NOTE — Telephone Encounter (Signed)
Looks like this was prescribed by endo.  Will you now be prescribing famotidine?

## 2020-03-28 NOTE — Telephone Encounter (Signed)
Faxed rx

## 2020-03-28 NOTE — Progress Notes (Signed)
I have collaborated with the care management provider regarding care management and care coordination activities outlined in this encounter and have reviewed this encounter including documentation in the note and care plan. I am certifying that I agree with the content of this note and encounter as supervising physician.  

## 2020-03-28 NOTE — Chronic Care Management (AMB) (Signed)
Medication coordination call:  Reviewed chart for medication changes ahead of medication coordination call.  Office visits, consults, or hospital visits since last CCM visit reviewed.   03/24/20: Neurology - Patient started on memantine 10 mg BID. Start with 1/2 tablet BID to see if tolerated. RTC 6 months.   BP Readings from Last 3 Encounters:  08/17/19 (!) 150/70  04/20/19 (!) 159/90  10/28/18 125/67    BP home monitoring: 120s/60s (checks weekly)  Patient obtains medications through Adherence Packaging  90 Days   Called patient and reviewed medications prior to delivery.  Last delivery included: delivered 01/07/20 . Amlodipine 2.5 mg one daily - breakfast  . Donepezil 10 mg one daily - bedtime . Famotidine 20 mg one daily - breakfast and evening meal . Rosuvastatin 10 mg one daily - bedtime  This delivery to include: deliver 03/31/20 (requested early to align with husband's medications) . Amlodipine 2.5 mg one daily - breakfast  . Donepezil 10 mg one daily - bedtime . Famotidine 20 mg one daily - breakfast and evening meal . Rosuvastatin 10 mg one daily - bedtime . Memantine 10 mg one twice daily - breakfast and evening meal   Patient declined the following medications:  Picks up all OTC items from [facility] (aspirin 325 mg daily, B12 1000 mcg daily, multivitamin, MiraLAX, liquid fiber, calcium/vitamin D 600 mg/800 IU 2 tabs daily, [place] Collagen Support - 1 daily, Flonase - 1-2 sprays in each nostril daily)  Currently still has enough Premarin to last until next delivery  Refills needed: famotidine   Confirmed delivery date of 03/31/2020, advised patient that pharmacy will contact them the morning of delivery. Patient denies additional concerns today.  Phil Dopp, PharmD Clinical Pharmacist [facility] at [facility] [phone]

## 2020-03-28 NOTE — Telephone Encounter (Signed)
Sent in

## 2020-03-29 ENCOUNTER — Ambulatory Visit
Admission: RE | Admit: 2020-03-29 | Discharge: 2020-03-29 | Disposition: A | Discharge status: Home / Self Care | Payer: PPO | Source: Ambulatory Visit | Attending: Internal Medicine | Admitting: Internal Medicine

## 2020-03-29 DIAGNOSIS — M8589 Other specified disorders of bone density and structure, multiple sites: Secondary | ICD-10-CM | POA: Diagnosis not present

## 2020-03-29 DIAGNOSIS — M85852 Other specified disorders of bone density and structure, left thigh: Secondary | ICD-10-CM | POA: Diagnosis not present

## 2020-03-29 DIAGNOSIS — Z780 Asymptomatic menopausal state: Secondary | ICD-10-CM | POA: Diagnosis not present

## 2020-03-30 ENCOUNTER — Telehealth: Payer: Self-pay

## 2020-03-30 NOTE — Telephone Encounter (Signed)
Kathy Blonder RN with access nurse said that received call that pt was outside and passed out. Pt's husband does not want to go to ED and requested call from [facility]. I spoke with pts husband (DPR signed) and pt was outside pulling weeds, stood up and without warning except pt felt lightheaded pt leaned over loss of consciousness which lasted about few seconds. Pt suddenly broke out in heavy perspiration. No after effects right now; BP 120/58 P 54. Pt was feeling lightheaded.No H/A, CP or SOB.no weakness or difficulty in walking. Pt does not remember immediately prior to or after passing out. Pt slept for 1/2 hour and is perfectly normal now. This occurred one hr ago. Pts husband request appt at [facility]; does  Not want to go to [facility] or ED.I spoke with Kathy Baldwin and he said if pt is not having any symptoms now, vitals are stable Kathy Baldwin said pt could rest today, drink plenty of water and be seen [facility] on 03/31/20 with ED precautions.(Kathy Baldwin does not have available appt on 03/31/20. Scheduled appt with [name] NP on 03/31/20 at 10:20. Kathy Baldwin voiced understanding and he will take pt to ED if condition changes or worsens prior to appt. FYI to [name] NP. Pt has no covid symptoms, no travel and no known exposure to + covid. Pt has gotten covid vaccinations. FYI to Kathy Baldwin as PCP and [name] NP.

## 2020-03-30 NOTE — Telephone Encounter (Signed)
Noted. Agree. Thank you.

## 2020-03-30 NOTE — Telephone Encounter (Signed)
Noted, will evaluate. 

## 2020-03-30 NOTE — Telephone Encounter (Signed)
[facility] Day - Client TELEPHONE ADVICE RECORD Access[facility]urse Patient Name: Kathy Baldwin Gender: Female DOB: [birth year] Age: 74 Y 7 M 14 D Return Phone Number: [phone] (Primary) Address: City/State/Zip: [place] [address] Client [facility] Day - Client Client Site [facility] - Day Physician [name] - MD Contact Type Call Who Is Calling Patient / Member / Family / Caregiver Call Type Triage / Clinical Caller Name Rashel Okeefe Relationship To Patient Spouse Return Phone Number [phone] (Primary) Chief Complaint FAINTING or PASSING OUT Reason for Call Symptomatic / Request for Health Information Initial Comment His wife was outside working in the yard, stood up and passed out, eyes rolled to back of head, twitched and was out for about 15 seconds. Translation No Nurse Assessment Nurse: Annye English, RN, Denise Date/Time (Eastern Time): 03/30/2020 11:36:05 AM Confirm and document reason for call. If symptomatic, describe symptoms. ---His wife was outside working in the yard, stood up and passed out, eyes rolled to back of head, twitched and was out for about 15 seconds. Pt states she is feeling okay now. Has the patient had close contact with a person known or suspected to have the novel coronavirus illness OR traveled / lives in area with major community spread (including international travel) in the last 14 days from the onset of symptoms? * If Asymptomatic, screen for exposure and travel within the last 14 days. ---No Does the patient have any new or worsening symptoms? ---Yes Will a triage be completed? ---Yes Related visit to physician within the last 2 weeks? ---No Does the PT have any chronic conditions? (i.e. diabetes, asthma, this includes High risk factors for pregnancy, etc.) ---No Is this a behavioral health or substance abuse call? ---No Guidelines Guideline Title Affirmed  Question Affirmed Notes Nurse Date/Time (Eastern Time) Fainting Age > 50 years (Exception: occurred > 1 hour ago AND now feels completely fine) Carmon, RN, Angelique Blonder 03/30/2020 11:38:55 AM PLEASE NOTE: All timestamps contained within this report are represented as [place] Standard Time. CONFIDENTIALTY NOTICE: This fax transmission is intended only for the addressee. It contains information that is legally privileged, confidential or otherwise protected from use or disclosure. If you are not the intended recipient, you are strictly prohibited from reviewing, disclosing, copying using or disseminating any of this information or taking any action in reliance on or regarding this information. If you have received this fax in error, please notify us immediately by telephone so that we can arrange for its return to [place]. Phone: [phone], Toll-Free: [phone], Fax: [phone] Page: 2 of 2 Call Id: [id number] Disp. Time Lamount Cohen Time) Disposition Final User 03/30/2020 11:33:53 AM Send to Urgent Queue Salvatore Marvel 03/30/2020 12:01:23 PM 911 Outcome Documentation Carmon, RN, Angelique Blonder Reason: Pt/PCG refused to call 911 or take the pt to the ER. Wants pt seen in [facility]. Will call [facility] w/report. 03/30/2020 11:42:57 AM Call EMS 911 Now Yes Carmon, RN, Leighton Ruff Disagree/Comply Disagree Caller Understands Yes PreDisposition Call Doctor Care Advice Given Per Guideline CALL EMS 911 NOW: CARE ADVICE given per Fainting (Adult) guideline. Comments User: Greggory Stallion, RN Date/Time Lamount Cohen Time): 03/30/2020 12:00:42 PM Called [facility] w/ pt report given to office nurse. Referrals GO TO FACILITY REFUSED

## 2020-03-31 ENCOUNTER — Ambulatory Visit (INDEPENDENT_AMBULATORY_CARE_PROVIDER_SITE_OTHER): Payer: PPO | Admitting: Primary Care

## 2020-03-31 ENCOUNTER — Encounter: Payer: Self-pay | Admitting: Primary Care

## 2020-03-31 ENCOUNTER — Other Ambulatory Visit: Payer: Self-pay

## 2020-03-31 VITALS — BP 122/60 | HR 64 | Temp 95.5°F | Ht 62.5 in | Wt 133.2 lb

## 2020-03-31 DIAGNOSIS — R55 Syncope and collapse: Secondary | ICD-10-CM | POA: Diagnosis not present

## 2020-03-31 DIAGNOSIS — I6523 Occlusion and stenosis of bilateral carotid arteries: Secondary | ICD-10-CM

## 2020-03-31 LAB — CBC
HCT: 38.4 % (ref 36.0–46.0)
Hemoglobin: 12.8 g/dL (ref 12.0–15.0)
MCHC: 33.3 g/dL (ref 30.0–36.0)
MCV: 90.8 fl (ref 78.0–100.0)
Platelets: 220 10*3/uL (ref 150.0–400.0)
RBC: 4.23 Mil/uL (ref 3.87–5.11)
RDW: 13.2 % (ref 11.5–15.5)
WBC: 7.4 10*3/uL (ref 4.0–10.5)

## 2020-03-31 LAB — BASIC METABOLIC PANEL
BUN: 14 mg/dL (ref 6–23)
CO2: 32 mEq/L (ref 19–32)
Calcium: 10.2 mg/dL (ref 8.4–10.5)
Chloride: 106 mEq/L (ref 96–112)
Creatinine, Ser: 1.06 mg/dL (ref 0.40–1.20)
GFR: 50.45 mL/min — ABNORMAL LOW (ref >60.00)
Glucose, Bld: 81 mg/dL (ref 70–99)
Potassium: 3.9 mEq/L (ref 3.5–5.1)
Sodium: 144 mEq/L (ref 135–145)

## 2020-03-31 LAB — POC URINALSYSI DIPSTICK (AUTOMATED)
Bilirubin, UA: NEGATIVE
Blood, UA: NEGATIVE
Glucose, UA: NEGATIVE
Ketones, UA: NEGATIVE
Leukocytes, UA: NEGATIVE
Nitrite, UA: NEGATIVE
Protein, UA: NEGATIVE
Spec Grav, UA: 1.01 (ref 1.010–1.025)
Urobilinogen, UA: 0.2 E.U./dL
pH, UA: 6 (ref 5.0–8.0)

## 2020-03-31 NOTE — Assessment & Plan Note (Signed)
Overdue for repeat carotid ultrasound, orders placed.

## 2020-03-31 NOTE — Progress Notes (Signed)
Subjective:    Patient ID: Kathy Baldwin, female    DOB: 05/24/1946, 74 y.o.   MRN: [id number]  HPI  This visit occurred during the SARS-CoV-2 public health emergency.  Safety protocols were in place, including screening questions prior to the visit, additional usage of staff PPE, and extensive cleaning of exam room while observing appropriate contact time as indicated for disinfecting solutions.   Ms. Gehrig is a 74 year old female patient of Dr. Sharen Hones with a medical history of hypertension, aortic atherosclerosis, carotid stenosis, CVA, OSA, cogitative impairment, hyperlipidemia who presents today with a chief complaint of syncope.  Yesterday she was outside working in her garden in the heat, bending forward and standing up often, wasn't drinking much water. She took a break to speak with a neighbor and her husband by the fence. During their conversation she started feeling lightheaded, so she leaned to her right against the fence, then felt weak in her legs, and passed out. Her neighbor was standing in front of her and actually caught her and lowered her carefully to the ground. Her husband caught the incident on camera from their home security system. He endorses that she was "out" for about 10 seconds.   She was able to ambulate inside with assistance, was provided with some cold water and cool compress, took a nap for about an hour, woke up feeling her normal self. Her husband checked her blood pressure and heart rate 20 minutes after bringing her inside. BP was 120/58 and HR of 54.   She was diagnosed with acute cystitis on 03/24/20 per neurology. Culture positive with beta hemolytic strep, treated with a seven day course of Augmentin for which she is compliant. She has three days remaining.   This same incidence of syncope occurred years ago, diagnosed with dehydration after some work up. She was working outdoors, not drinking enough water, stood up and passed out.   Today  she feels well, like her normal self. She denies dizziness, chest pain, visual changes, feeling weak. Her husband denies acute confusion, she is back to baseline. She has been working to stay hydrated with water.    BP Readings from Last 3 Encounters:  03/31/20 (!) 122/60  08/17/19 (!) 150/70  04/20/19 (!) 159/90     Review of Systems  Eyes: Negative for visual disturbance.  Respiratory: Negative for shortness of breath.   Cardiovascular: Negative for chest pain.  Genitourinary: Negative for dysuria.  Neurological: Negative for dizziness, light-headedness and headaches.       Past Medical History:  Diagnosis Date  . Age related osteoporosis 04/09/2016  . Allergic rhinitis   . Allergy Statins, chocolate, cabbabe   Shown on record  . Arthritis Though never diagnosed   Seems to be in hands  . 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 Holly Bodily 2010  . DNR no code (do not resuscitate) 03/20/2017  . GERD (gastroesophageal reflux disease)   . Hearing loss   . History of gallstones   . Hyperlipidemia   . Hypertension   . Hypertrophy of nasal turbinates   . Leg mass, left    in calf muscle seeing surgeon tomorrow regarding  . Lymphocytosis   . Menopause age 86  . Multiple food allergies    chocolate and cabbage  . OSA on CPAP   . Osteopenia Oct. 2014  . Sleep apnea   . Thyroid nodule   . Vertigo   .  Vitamin D deficiency disease      Social History   Socioeconomic History  . Marital status: Married    Spouse name: [name]  . Number of children: 1  . Years of education: Not on file  . Highest education level: Bachelor's degree (e.g., BA, AB, BS)  Occupational History  . Occupation: Retired  Tobacco Use  . Smoking status: Never Smoker  . Smokeless tobacco: Never Used  . Tobacco comment: Never smoked, so cannot quit...  Vaping Use  . Vaping Use: Never used  Substance and Sexual Activity  . Alcohol use: Not Currently     Alcohol/week: 0.0 standard drinks    Comment: Occasional celebratory drink (holidays, etc.) only.  . Drug use: No  . Sexual activity: Yes    Partners: Male    Birth control/protection: Post-menopausal  Other Topics Concern  . Not on file  Social History Narrative   Lives with husband [name]   Occ: retired Diplomatic Services operational officer   Edu: BA   Act:    Diet:    Social Determinants of Corporate investment banker Strain:   . Difficulty of Paying Living Expenses:   Food Insecurity:   . Worried About Programme researcher, broadcasting/film/video in the Last Year:   . Barista in the Last Year:   Transportation Needs:   . Freight forwarder (Medical):   Marland Kitchen Lack of Transportation (Non-Medical):   Physical Activity:   . Days of Exercise per Week:   . Minutes of Exercise per Session:   Stress:   . Feeling of Stress :   Social Connections:   . Frequency of Communication with Friends and Family:   . Frequency of Social Gatherings with Friends and Family:   . Attends Religious Services:   . Active Member of Clubs or Organizations:   . Attends Banker Meetings:   Marland Kitchen Marital Status:   Intimate Partner Violence:   . Fear of Current or Ex-Partner:   . Emotionally Abused:   Marland Kitchen Physically Abused:   . Sexually Abused:     Past Surgical History:  Procedure Laterality Date  . BREAST CYST ASPIRATION Right    negative  . CARPAL TUNNEL RELEASE Right 2015  . CATARACT EXTRACTION W/PHACO Left 09/30/2018   Procedure: CATARACT EXTRACTION PHACO AND INTRAOCULAR LENS PLACEMENT (IOC)  LEFT;  Surgeon: Lockie Mola, MD;  Location[facility];  Service: Ophthalmology;  Laterality: Left;  sleep apnea  . CATARACT EXTRACTION W/PHACO Right 10/28/2018   Procedure: CATARACT EXTRACTION PHACO AND INTRAOCULAR LENS PLACEMENT (IOC)  RIGHT;  Surgeon: Lockie Mola, MD;  Location: [facility];  Service: Ophthalmology;  Laterality: Right;  . CHOLECYSTECTOMY  1990  . COLONOSCOPY  04/25/08  .  DILATION AND CURETTAGE OF UTERUS  1982   s/p miscarriage    Family History  Problem Relation Age of Onset  . Healthy Mother   . Arthritis Mother   . Lung disease Father        black lung  . Heart disease Father   . Hyperlipidemia Father   . Hypertension Father   . Emphysema Sister   . Healthy Sister   . Cancer Brother        unsure of type  . Asthma Daughter   . Heart disease Sister   . Thyroid disease Sister   . Stroke Maternal Grandmother   . Hypertension Maternal Grandmother   . Diabetes Maternal Grandmother   . Diabetes Sister   . Healthy Sister   .  Breast cancer Neg Hx     Allergies  Allergen Reactions  . Cabbage Other (See Comments)    Hypersensitivity on allergy testing  . Chocolate Other (See Comments)    Hypersensitivity on allergy testing  . Statins Other (See Comments)    Zocor and Pravastatin--leg cramps Other reaction(s): Other (See Comments) Zocor and Pravastatin--leg cramps    Current Outpatient Medications on File Prior to Visit  Medication Sig Dispense Refill  . amLODipine (NORVASC) 2.5 MG tablet Take 1 tablet (2.5 mg total) by mouth daily. 90 tablet 3  . aspirin EC 325 MG tablet Take 1 tablet (325 mg total) by mouth daily.    . Calcium Carb-Cholecalciferol (CALCIUM 600+D) 600-800 MG-UNIT TABS Take by mouth daily. 2 capsule daily    . calcium carbonate (OSCAL) 1500 (600 CA) MG TABS tablet Take 600 mg of elemental calcium by mouth 2 (two) times daily with a meal.    . COLLAGEN PO Take by mouth.    . conjugated estrogens (PREMARIN) vaginal cream Use a pea-sized amount externally and internally twice a week at night 42.5 g 1  . Cyanocobalamin (B-12) 2500 MCG TABS Take 1 tablet by mouth daily. Pt taking 1/2 of a 5000 mcg tablet    . donepezil (ARICEPT) 10 MG tablet Take 10 mg by mouth at bedtime.     . famotidine (PEPCID) 20 MG tablet Take 1 tablet (20 mg total) by mouth 2 (two) times daily as needed for heartburn or indigestion. 180 tablet 3  . FIBER  PO Take by mouth.    . fluticasone (FLONASE) 50 MCG/ACT nasal spray SHAKE LIQUID AND USE 2 SPRAYS IN EACH NOSTRIL DAILY 16 g 11  . MAGNESIUM PO Take by mouth every other day. Takes 2 tablets every other morning    . Multiple Vitamin (MULTIVITAMIN) tablet Take 1 tablet by mouth daily.    . Multiple Vitamins-Minerals (HAIR/SKIN/NAILS/BIOTIN PO) Take 1 tablet by mouth daily.    Marland Kitchen PEDIASURE/FIBER (PEDIASURE/FIBER) LIQD Take 10 mLs by mouth.    . polyethylene glycol (MIRALAX / GLYCOLAX) packet Take 17 g by mouth daily.    . Potassium 99 MG TABS Take 1 tablet (99 mg total) by mouth every other day.    . rosuvastatin (CRESTOR) 10 MG tablet TAKE 1 TABLET(10 MG) BY MOUTH AT BEDTIME 90 tablet 3   No current facility-administered medications on file prior to visit.    BP (!) 122/60   Pulse 64   Temp (!) 95.5 F (35.3 C) (Temporal)   Ht 5' 2.5" (1.588 m)   Wt 133 lb 4 oz (60.4 kg)   SpO2 98%   BMI 23.98 kg/m    Objective:   Physical Exam Cardiovascular:     Rate and Rhythm: Normal rate and regular rhythm.  Pulmonary:     Effort: Pulmonary effort is normal.     Breath sounds: Normal breath sounds.  Musculoskeletal:     Cervical back: Neck supple.  Skin:    General: Skin is warm and dry.  Neurological:     Mental Status: She is alert and oriented to person, place, and time.     Cranial Nerves: No cranial nerve deficit.     Motor: No weakness.     Coordination: Romberg sign negative. Finger-Nose-Finger Test normal.     Gait: Gait normal.            Assessment & Plan:

## 2020-03-31 NOTE — Assessment & Plan Note (Signed)
Acute event occurring yesterday while working in the heat, witnessed by her husband and neighbor, no falls. Prior incidence of this occurring years ago.  ECG today with sinus bradycardia with rate of 51. No PAC/PVC, acute ST changes. No prior ECG to compare.   Orthostatic vitals with HR of 49 laying, 68 standing. BP in the 160's/70's which is not what we got during checking.   Checking labs today including BMP, CBC, UA with culture.   Referral placed to cardiology given syncope and bradycardia. She is not on beta blocker treatment.

## 2020-03-31 NOTE — Patient Instructions (Signed)
Stop by the lab prior to leaving today. I will notify you of your results once received.   Stop by the front desk and speak with either Shirlee Limerick or Charmaine regarding your referral to cardiology.   Ensure you are consuming 64 ounces of water daily.  Avoid being outdoors during hotter times of the day.  It was a pleasure meeting you!

## 2020-04-01 LAB — URINE CULTURE
MICRO NUMBER:: 10742786
Result:: NO GROWTH
SPECIMEN QUALITY:: ADEQUATE

## 2020-04-04 ENCOUNTER — Ambulatory Visit: Payer: PPO | Admitting: Cardiovascular Disease

## 2020-04-05 ENCOUNTER — Telehealth: Payer: PPO

## 2020-04-07 DIAGNOSIS — E782 Mixed hyperlipidemia: Secondary | ICD-10-CM | POA: Diagnosis not present

## 2020-04-07 DIAGNOSIS — R001 Bradycardia, unspecified: Secondary | ICD-10-CM | POA: Diagnosis not present

## 2020-04-07 DIAGNOSIS — G309 Alzheimer's disease, unspecified: Secondary | ICD-10-CM | POA: Diagnosis not present

## 2020-04-07 DIAGNOSIS — K219 Gastro-esophageal reflux disease without esophagitis: Secondary | ICD-10-CM | POA: Diagnosis not present

## 2020-04-07 DIAGNOSIS — I639 Cerebral infarction, unspecified: Secondary | ICD-10-CM | POA: Diagnosis not present

## 2020-04-07 DIAGNOSIS — R55 Syncope and collapse: Secondary | ICD-10-CM | POA: Diagnosis not present

## 2020-04-07 DIAGNOSIS — F0280 Dementia in other diseases classified elsewhere without behavioral disturbance: Secondary | ICD-10-CM | POA: Diagnosis not present

## 2020-04-19 DIAGNOSIS — M8589 Other specified disorders of bone density and structure, multiple sites: Secondary | ICD-10-CM | POA: Diagnosis not present

## 2020-04-19 DIAGNOSIS — E042 Nontoxic multinodular goiter: Secondary | ICD-10-CM | POA: Diagnosis not present

## 2020-04-20 ENCOUNTER — Ambulatory Visit: Payer: PPO

## 2020-04-25 ENCOUNTER — Telehealth: Payer: Self-pay

## 2020-04-25 DIAGNOSIS — M8589 Other specified disorders of bone density and structure, multiple sites: Secondary | ICD-10-CM | POA: Diagnosis not present

## 2020-04-25 DIAGNOSIS — Z780 Asymptomatic menopausal state: Secondary | ICD-10-CM | POA: Diagnosis not present

## 2020-04-25 NOTE — Progress Notes (Signed)
Chronic Care Management Pharmacy Assistant   Name: Kathy Baldwin  MRN: [id number] DOB: 01/11/1946  Reason for Encounter: Medication Review  Patient Questions:  1.  Have you seen any other providers since your last visit? Yes, see below  2.  Any changes in your medicines or health? Yes, see below   03/31/20 OV with PCP for Syncope- referred to cardiology. 04/07/20 OV with Cardiology- discontinued Amlodipine due to low blood pressures.   PCP : [name], MD  Allergies:   Allergies  Allergen Reactions  . Cabbage Other (See Comments)    Hypersensitivity on allergy testing  . Chocolate Other (See Comments)    Hypersensitivity on allergy testing  . Statins Other (See Comments)    Zocor and Pravastatin--leg cramps Other reaction(s): Other (See Comments) Zocor and Pravastatin--leg cramps    Medications: Outpatient Encounter Medications as of 04/25/2020  Medication Sig  . amLODipine (NORVASC) 2.5 MG tablet Take 1 tablet (2.5 mg total) by mouth daily.  Marland Kitchen amoxicillin-clavulanate (AUGMENTIN) 875-125 MG tablet Take 1 tablet by mouth 2 (two) times daily.  Marland Kitchen aspirin EC 325 MG tablet Take 1 tablet (325 mg total) by mouth daily.  . Calcium Carb-Cholecalciferol (CALCIUM 600+D) 600-800 MG-UNIT TABS Take by mouth daily. 2 capsule daily  . calcium carbonate (OSCAL) 1500 (600 CA) MG TABS tablet Take 600 mg of elemental calcium by mouth 2 (two) times daily with a meal.  . COLLAGEN PO Take by mouth.  . conjugated estrogens (PREMARIN) vaginal cream Use a pea-sized amount externally and internally twice a week at night  . Cyanocobalamin (B-12) 2500 MCG TABS Take 1 tablet by mouth daily. Pt taking 1/2 of a 5000 mcg tablet  . donepezil (ARICEPT) 10 MG tablet Take 10 mg by mouth at bedtime.   . famotidine (PEPCID) 20 MG tablet Take 1 tablet (20 mg total) by mouth 2 (two) times daily as needed for heartburn or indigestion.  Marland Kitchen FIBER PO Take by mouth.  . fluticasone (FLONASE) 50 MCG/ACT  nasal spray SHAKE LIQUID AND USE 2 SPRAYS IN EACH NOSTRIL DAILY  . MAGNESIUM PO Take by mouth every other day. Takes 2 tablets every other morning  . Multiple Vitamin (MULTIVITAMIN) tablet Take 1 tablet by mouth daily.  . Multiple Vitamins-Minerals (HAIR/SKIN/NAILS/BIOTIN PO) Take 1 tablet by mouth daily.  Marland Kitchen PEDIASURE/FIBER (PEDIASURE/FIBER) LIQD Take 10 mLs by mouth.  . polyethylene glycol (MIRALAX / GLYCOLAX) packet Take 17 g by mouth daily.  . Potassium 99 MG TABS Take 1 tablet (99 mg total) by mouth every other day.  . rosuvastatin (CRESTOR) 10 MG tablet TAKE 1 TABLET(10 MG) BY MOUTH AT BEDTIME   No facility-administered encounter medications on file as of 04/25/2020.    Current Diagnosis: Patient Active Problem List   Diagnosis Date Noted  . Syncope 03/31/2020  . 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  . Mass of leg, left 10/23/2018  . History of cerebellar stroke 10/08/2018  . Small vessel disease, 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  . Carotid stenosis, bilateral 10/08/2018  . Thyroid nodule 04/14/2018  . DNR no code (do not resuscitate) 03/20/2017  . Vaginal atrophy 02/18/2017  . Aortic atherosclerosis (HCC) 09/05/2016  . Decreased hearing, bilateral 12/04/2015  . Preventative health care 12/04/2015  . Hypertension   . 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 GERD (gastroesophageal reflux disease)   . OSA on CPAP   . Hypertrophy of nasal turbinates   . Hyperlipidemia 03/28/2015  . Allergic rhinitis  03/28/2015  . History of decompression of median nerve 02/14/2014  . Osteopenia 06/09/2013    Goals Addressed   None     Follow-Up:  Coordination of Enhanced Pharmacy Services  Reviewed chart for medication changes ahead of medication coordination call.  03/31/20 OV with PCP for Syncope- referred to cardiology. 04/07/20 OV with Cardiology- discontinued Amlodipine due to low blood pressures.   BP Readings from Last 3 Encounters:    03/31/20 (!) 122/60  08/17/19 (!) 150/70  04/20/19 (!) 159/90    No results found for: HGBA1C   Patient obtains medications through Adherence Packaging  90 Days   Last adherence delivery included:  Marland Kitchen Amlodipine 2.5 mg one daily - 1 breakfast  . Donepezil 10 mg one daily - 1 bedtime . Famotidine 20 mg one daily - 1 breakfast, 1 evening meal . Rosuvastatin 10 mg one daily - 1 bedtime . Memantine 10 mg - 1 breakfast, 1 evening meal . Flonase (PRN  Patient is not due for adherence delivery at this time. Called patient and reviewed medications.   Patient declined the following medications due to 90 DS given 03/31/20 . Amlodipine 2.5 mg one daily - 1 breakfast  . Donepezil 10 mg one daily - 1 bedtime . Famotidine 20 mg one daily - 1 breakfast, 1 evening meal . Rosuvastatin 10 mg one daily - 1 bedtime . Memantine 10 mg - 1 breakfast, 1 evening meal . Flonase (PRN  Patient does not need any refills for any medications at this time.   Advised patient that we would call ahead of next adherence delivery date.  [place] Uselman, CMA Clinical Pharmacist Assistant  [phone]

## 2020-05-03 DIAGNOSIS — R2242 Localized swelling, mass and lump, left lower limb: Secondary | ICD-10-CM | POA: Diagnosis not present

## 2020-05-03 DIAGNOSIS — M6740 Ganglion, unspecified site: Secondary | ICD-10-CM | POA: Diagnosis not present

## 2020-05-08 DIAGNOSIS — R55 Syncope and collapse: Secondary | ICD-10-CM | POA: Diagnosis not present

## 2020-05-08 DIAGNOSIS — I639 Cerebral infarction, unspecified: Secondary | ICD-10-CM | POA: Diagnosis not present

## 2020-05-08 DIAGNOSIS — R001 Bradycardia, unspecified: Secondary | ICD-10-CM | POA: Diagnosis not present

## 2020-05-11 ENCOUNTER — Encounter: Payer: Self-pay | Admitting: Family Medicine

## 2020-05-12 ENCOUNTER — Encounter: Payer: Self-pay | Admitting: Family Medicine

## 2020-05-12 ENCOUNTER — Ambulatory Visit (INDEPENDENT_AMBULATORY_CARE_PROVIDER_SITE_OTHER): Payer: PPO | Admitting: Family Medicine

## 2020-05-12 ENCOUNTER — Other Ambulatory Visit: Payer: Self-pay

## 2020-05-12 VITALS — BP 140/68 | HR 81 | Temp 98.0°F | Ht 62.5 in | Wt 133.1 lb

## 2020-05-12 DIAGNOSIS — R140 Abdominal distension (gaseous): Secondary | ICD-10-CM | POA: Diagnosis not present

## 2020-05-12 DIAGNOSIS — Z23 Encounter for immunization: Secondary | ICD-10-CM

## 2020-05-12 DIAGNOSIS — K219 Gastro-esophageal reflux disease without esophagitis: Secondary | ICD-10-CM | POA: Diagnosis not present

## 2020-05-12 DIAGNOSIS — R1084 Generalized abdominal pain: Secondary | ICD-10-CM | POA: Diagnosis not present

## 2020-05-12 DIAGNOSIS — R6881 Early satiety: Secondary | ICD-10-CM

## 2020-05-12 LAB — HEPATIC FUNCTION PANEL
ALT: 17 U/L (ref 0–35)
AST: 31 U/L (ref 0–37)
Albumin: 4.2 g/dL (ref 3.5–5.2)
Alkaline Phosphatase: 63 U/L (ref 39–117)
Bilirubin, Direct: 0.1 mg/dL (ref 0.0–0.3)
Total Bilirubin: 0.8 mg/dL (ref 0.2–1.2)
Total Protein: 6.9 g/dL (ref 6.0–8.3)

## 2020-05-12 LAB — LIPASE: Lipase: 72 U/L — ABNORMAL HIGH (ref 11.0–59.0)

## 2020-05-12 MED ORDER — OMEPRAZOLE 40 MG PO CPDR: 40.0000 mg | DELAYED_RELEASE_CAPSULE | Freq: Every day | ORAL | 1 refills | Status: DC

## 2020-05-12 NOTE — Progress Notes (Addendum)
This visit was conducted in person.  BP 140/68 (BP Location: Left Arm, Patient Position: Sitting, Cuff Size: Normal)   Pulse 81   Temp 98 F (36.7 C) (Temporal)   Ht 5' 2.5" (1.588 m)   Wt 133 lb 2 oz (60.4 kg)   SpO2 97%   BMI 23.96 kg/m    CC: GI symptoms Subjective:    Patient ID: Kathy Baldwin, female    DOB: 09/07/1946, 74 y.o.   MRN: [id number]  HPI: Kathy Baldwin is a 74 y.o. female presenting on 05/12/2020 for GI Problem (C/o gas pains and stomach cramps.  Has had issues for past few yrs but sxs worsening. H/o GERD.  Tried Miralax and higher fiber in diet. )   Longstanding history of gassiness, recently worsening bloating and belching and epigastric pressure into thoracic back. Worsening heartburn and reflux noted as well. Denies any dysphagia but endorses some early satiety.   Takes miralax and fiber every morning with some benefit for chronic constipation.  GERD - taking famotidine 20mg  BID.  Doesn't note any change with diet.   Previously saw Dr Servando SnareWohl 2017, no record available.   No unexpected weight loss.  No fevers/chills, night sweats, nausea/vomiting, diarrhea, urinary symptoms.  No vaginal bleeding.  She does experience some pelvic pressure.   S/p cholecystectomy 1990s.      Relevant past medical, surgical, family and social history reviewed and updated as indicated. Interim medical history since our last visit reviewed. Allergies and medications reviewed and updated. Outpatient Medications Prior to Visit  Medication Sig Dispense Refill  . aspirin EC 325 MG tablet Take 1 tablet (325 mg total) by mouth daily.    . Calcium Carb-Cholecalciferol (CALCIUM 600+D) 600-800 MG-UNIT TABS Take by mouth daily. 2 capsule daily    . COLLAGEN PO Take by mouth.    . conjugated estrogens (PREMARIN) vaginal cream Use a pea-sized amount externally and internally twice a week at night 42.5 g 1  . Cyanocobalamin (B-12) 2500 MCG TABS Take 1 tablet by mouth daily. Pt  taking 1/2 of a 5000 mcg tablet    . donepezil (ARICEPT) 10 MG tablet Take 10 mg by mouth at bedtime.     . famotidine (PEPCID) 20 MG tablet Take 1 tablet (20 mg total) by mouth 2 (two) times daily as needed for heartburn or indigestion. 180 tablet 3  . FIBER PO Take by mouth.    . fluticasone (FLONASE) 50 MCG/ACT nasal spray SHAKE LIQUID AND USE 2 SPRAYS IN EACH NOSTRIL DAILY 16 g 11  . memantine (NAMENDA) 10 MG tablet Take 10 mg by mouth 2 (two) times daily.    . Multiple Vitamin (MULTIVITAMIN) tablet Take 1 tablet by mouth daily.    . Multiple Vitamins-Minerals (HAIR/SKIN/NAILS/BIOTIN PO) Take 1 tablet by mouth daily.    Marland Kitchen. PEDIASURE/FIBER (PEDIASURE/FIBER) LIQD Take 10 mLs by mouth.    . polyethylene glycol (MIRALAX / GLYCOLAX) packet Take 17 g by mouth daily.    . rosuvastatin (CRESTOR) 10 MG tablet TAKE 1 TABLET(10 MG) BY MOUTH AT BEDTIME 90 tablet 3  . amLODipine (NORVASC) 2.5 MG tablet Take 1 tablet (2.5 mg total) by mouth daily. 90 tablet 3  . amoxicillin-clavulanate (AUGMENTIN) 875-125 MG tablet Take 1 tablet by mouth 2 (two) times daily.    . calcium carbonate (OSCAL) 1500 (600 CA) MG TABS tablet Take 600 mg of elemental calcium by mouth 2 (two) times daily with a meal.    . MAGNESIUM PO Take by  mouth every other day. Takes 2 tablets every other morning    . Potassium 99 MG TABS Take 1 tablet (99 mg total) by mouth every other day.     No facility-administered medications prior to visit.     Per HPI unless specifically indicated in ROS section below Review of Systems Objective:  BP 140/68 (BP Location: Left Arm, Patient Position: Sitting, Cuff Size: Normal)   Pulse 81   Temp 98 F (36.7 C) (Temporal)   Ht 5' 2.5" (1.588 m)   Wt 133 lb 2 oz (60.4 kg)   SpO2 97%   BMI 23.96 kg/m   Wt Readings from Last 3 Encounters:  05/12/20 133 lb 2 oz (60.4 kg)  03/31/20 133 lb 4 oz (60.4 kg)  08/17/19 136 lb 1 oz (61.7 kg)      Physical Exam Vitals and nursing note reviewed.    Constitutional:      Appearance: Normal appearance. She is not ill-appearing.  HENT:     Mouth/Throat:     Mouth: Mucous membranes are moist.     Pharynx: Oropharynx is clear. No oropharyngeal exudate or posterior oropharyngeal erythema.  Neck:     Thyroid: Thyroid mass (R nodule) present. No thyromegaly or thyroid tenderness.  Cardiovascular:     Rate and Rhythm: Normal rate and regular rhythm.     Pulses: Normal pulses.     Heart sounds: Normal heart sounds. No murmur heard.   Pulmonary:     Effort: Pulmonary effort is normal. No respiratory distress.     Breath sounds: Normal breath sounds. No wheezing, rhonchi or rales.  Abdominal:     General: Abdomen is flat. Bowel sounds are normal. There is no distension.     Palpations: Abdomen is soft. There is no mass.     Tenderness: There is generalized abdominal tenderness. There is no right CVA tenderness, left CVA tenderness, guarding or rebound. Negative signs include Murphy's sign.     Hernia: No hernia is present.     Comments: Predominant epigastric discomfort.   Musculoskeletal:     Right lower leg: No edema.     Left lower leg: No edema.  Skin:    General: Skin is warm and dry.     Findings: No rash.  Neurological:     Mental Status: She is alert.       Results for orders placed or performed in visit on 03/31/20  Urine Culture   Specimen: Urine  Result Value Ref Range   MICRO NUMBER: [id number]    SPECIMEN QUALITY: Adequate    Sample Source URINE    STATUS: FINAL    Result: No Growth   Basic metabolic panel  Result Value Ref Range   Sodium 144 135 - 145 mEq/L   Potassium 3.9 3.5 - 5.1 mEq/L   Chloride 106 96 - 112 mEq/L   CO2 32 19 - 32 mEq/L   Glucose, Bld 81 70 - 99 mg/dL   BUN 14 6 - 23 mg/dL   Creatinine, Ser 1.02 0.40 - 1.20 mg/dL   GFR 58.52 (L) >77.82 mL/min   Calcium 10.2 8.4 - 10.5 mg/dL  CBC  Result Value Ref Range   WBC 7.4 4.0 - 10.5 K/uL   RBC 4.23 3.87 - 5.11 Mil/uL   Platelets 220.0 150 -  400 K/uL   Hemoglobin 12.8 12.0 - 15.0 g/dL   HCT 42.3 36 - 46 %   MCV 90.8 78.0 - 100.0 fl   MCHC 33.3 30.0 -  36.0 g/dL   RDW 40.9 81.1 - 91.4 %  POCT Urinalysis Dipstick (Automated)  Result Value Ref Range   Color, UA Yellow    Clarity, UA Clear    Glucose, UA Negative Negative   Bilirubin, UA Negative    Ketones, UA Negative    Spec Grav, UA 1.010 1.010 - 1.025   Blood, UA Negative    pH, UA 6.0 5.0 - 8.0   Protein, UA Negative Negative   Urobilinogen, UA 0.2 0.2 or 1.0 E.U./dL   Nitrite, UA Negative    Leukocytes, UA Negative Negative   Assessment & Plan:  This visit occurred during the SARS-CoV-2 public health emergency.  Safety protocols were in place, including screening questions prior to the visit, additional usage of staff PPE, and extensive cleaning of exam room while observing appropriate contact time as indicated for disinfecting solutions.   Problem List Items Addressed This Visit    GERD (gastroesophageal reflux disease)    Start 3 wk PPI course then PRN. May continue pepcid BID.       Relevant Medications   omeprazole (PRILOSEC) 40 MG capsule   Other Relevant Orders   Ambulatory referral to Gastroenterology   Abdominal bloating - Primary    Increase in bloating, belching, gassiness as well as worsening GERD, early satiety and abdominal discomfort. No fevers, weight loss, dysphagia or night sweats. Ovaries remain - consider pelvic imaging if unrevealing evaluation.  Check LFT, lipase (recent BMP and CBC and urinalysis normal).  Check abdominal ultrasound. For early satiety with discomfort, will refer to [facility] GI per patient request.  Rx omeprazole 40mg  daily x 3 wks then PRN for worsening GERD symptoms.  Discussed gas-x and probiotic use.  Pt agrees with plan.       Relevant Orders   Lipase   Hepatic function panel   Abdomen Complete   Ambulatory referral to Gastroenterology    Other Visit Diagnoses    Early satiety       Relevant Orders    Lipase   Hepatic function panel   US Abdomen Complete   Ambulatory referral to Gastroenterology   Abdominal discomfort, generalized       Relevant Orders   Lipase   Hepatic function panel   US Abdomen Complete   Ambulatory referral to Gastroenterology   Need for influenza vaccination       Relevant Orders   Flu Vaccine QUAD High Dose(Fluad) (Completed)       Meds ordered this encounter  Medications  . omeprazole (PRILOSEC) 40 MG capsule    Sig: Take 1 capsule (40 mg total) by mouth daily. For 3 weeks then as needed    Dispense:  30 capsule    Refill:  1   Orders Placed This Encounter  Procedures  . US Abdomen Complete    Standing Status:   Future    Standing Expiration Date:   05/12/2021    Order Specific Question:   Reason for Exam (SYMPTOM  OR DIAGNOSIS REQUIRED)    Answer:   bloating, belching, gassiness    Order Specific Question:   Preferred imaging location?    Answer:   [facility]  . Flu Vaccine QUAD High Dose(Fluad)  . Lipase  . Hepatic function panel  . Ambulatory referral to Gastroenterology    Referral Priority:   Routine    Referral Type:   Consultation    Referral Reason:   Specialty Services Required    Number of Visits Requested:   1  Patient Instructions  Labs today We will order abdominal ultrasound.  For symptoms, may continue Gas-X (simethicone) for gas symptoms, avoid gas producing foods (beans, onions, celery, carrots, raisins, bananas, apricots, prunes, brussel sprouts, wheat germ, pretzels) Start omeprazole 40mg  daily for heartburn for 3 weeks then as needed.  Start or ensure you're taking daily probiotic (activia or align or philips colon health).  We will refer you to Dr at [facility].   Follow up plan: No follow-ups on file.  [facility], MD

## 2020-05-12 NOTE — Addendum Note (Signed)
Addended by: Nanci Pina on: 05/12/2020 02:07 PM   Modules accepted: Orders

## 2020-05-12 NOTE — Patient Instructions (Signed)
Labs today We will order abdominal ultrasound.  For symptoms, may continue Gas-X (simethicone) for gas symptoms, avoid gas producing foods (beans, onions, celery, carrots, raisins, bananas, apricots, prunes, brussel sprouts, wheat germ, pretzels) Start omeprazole 40mg  daily for heartburn for 3 weeks then as needed.  Start or ensure you're taking daily probiotic (activia or align or philips colon health).  We will refer you to Dr at [facility].

## 2020-05-12 NOTE — Telephone Encounter (Signed)
Will see today.  

## 2020-05-12 NOTE — Assessment & Plan Note (Addendum)
Increase in bloating, belching, gassiness as well as worsening GERD, early satiety and abdominal discomfort. No fevers, weight loss, dysphagia or night sweats. Ovaries remain - consider pelvic imaging if unrevealing evaluation.  Check LFT, lipase (recent BMP and CBC and urinalysis normal).  Check abdominal ultrasound. For early satiety with discomfort, will refer to [facility] GI per patient request.  Rx omeprazole 40mg  daily x 3 wks then PRN for worsening GERD symptoms.  Discussed gas-x and probiotic use.  Pt agrees with plan.

## 2020-05-12 NOTE — Assessment & Plan Note (Signed)
Start 3 wk PPI course then PRN. May continue pepcid BID.

## 2020-05-19 ENCOUNTER — Telehealth: Payer: Self-pay | Admitting: Family Medicine

## 2020-05-19 NOTE — Telephone Encounter (Signed)
Pt returned your call regarding ultrasound she would like a call back.

## 2020-05-19 NOTE — Telephone Encounter (Signed)
Kathy Baldwin is calling you back and says his wife is now available for making her [place] appmt.  Pls return call.  Thank you!

## 2020-05-22 DIAGNOSIS — R001 Bradycardia, unspecified: Secondary | ICD-10-CM | POA: Diagnosis not present

## 2020-05-22 DIAGNOSIS — I63233 Cerebral infarction due to unspecified occlusion or stenosis of bilateral carotid arteries: Secondary | ICD-10-CM | POA: Diagnosis not present

## 2020-05-22 DIAGNOSIS — R55 Syncope and collapse: Secondary | ICD-10-CM | POA: Diagnosis not present

## 2020-05-22 DIAGNOSIS — I639 Cerebral infarction, unspecified: Secondary | ICD-10-CM | POA: Diagnosis not present

## 2020-05-24 ENCOUNTER — Other Ambulatory Visit: Payer: Self-pay

## 2020-05-24 ENCOUNTER — Ambulatory Visit
Admission: RE | Admit: 2020-05-24 | Discharge: 2020-05-24 | Disposition: A | Discharge status: Home / Self Care | Payer: PPO | Source: Ambulatory Visit | Attending: Family Medicine | Admitting: Family Medicine

## 2020-05-24 DIAGNOSIS — R1084 Generalized abdominal pain: Secondary | ICD-10-CM | POA: Diagnosis not present

## 2020-05-24 DIAGNOSIS — R6881 Early satiety: Secondary | ICD-10-CM

## 2020-05-24 DIAGNOSIS — R140 Abdominal distension (gaseous): Secondary | ICD-10-CM | POA: Diagnosis not present

## 2020-05-24 DIAGNOSIS — Z9049 Acquired absence of other specified parts of digestive tract: Secondary | ICD-10-CM | POA: Diagnosis not present

## 2020-05-25 ENCOUNTER — Encounter: Payer: Self-pay | Admitting: Family Medicine

## 2020-05-30 ENCOUNTER — Telehealth: Payer: Self-pay

## 2020-05-30 NOTE — Chronic Care Management (AMB) (Signed)
Chronic Care Management Pharmacy Assistant   Name: Kathy Baldwin  MRN: [id number] DOB: 09-28-1945  Reason for Encounter: Medication Review/ Monthly Medication Dispensing Call.  PCP : [name], MD  Allergies:   Allergies  Allergen Reactions  . Cabbage Other (See Comments)    Hypersensitivity on allergy testing  . Chocolate Other (See Comments)    Hypersensitivity on allergy testing  . Statins Other (See Comments)    Zocor and Pravastatin--leg cramps Other reaction(s): Other (See Comments) Zocor and Pravastatin--leg cramps    Medications: Outpatient Encounter Medications as of 05/30/2020  Medication Sig  . aspirin EC 325 MG tablet Take 1 tablet (325 mg total) by mouth daily.  . Calcium Carb-Cholecalciferol (CALCIUM 600+D) 600-800 MG-UNIT TABS Take by mouth daily. 2 capsule daily  . COLLAGEN PO Take by mouth.  . conjugated estrogens (PREMARIN) vaginal cream Use a pea-sized amount externally and internally twice a week at night  . Cyanocobalamin (B-12) 2500 MCG TABS Take 1 tablet by mouth daily. Pt taking 1/2 of a 5000 mcg tablet  . donepezil (ARICEPT) 10 MG tablet Take 10 mg by mouth at bedtime.   . famotidine (PEPCID) 20 MG tablet Take 1 tablet (20 mg total) by mouth 2 (two) times daily as needed for heartburn or indigestion.  Marland Kitchen FIBER PO Take by mouth.  . fluticasone (FLONASE) 50 MCG/ACT nasal spray SHAKE LIQUID AND USE 2 SPRAYS IN EACH NOSTRIL DAILY  . memantine (NAMENDA) 10 MG tablet Take 10 mg by mouth 2 (two) times daily.  . Multiple Vitamin (MULTIVITAMIN) tablet Take 1 tablet by mouth daily.  . Multiple Vitamins-Minerals (HAIR/SKIN/NAILS/BIOTIN PO) Take 1 tablet by mouth daily.  Marland Kitchen omeprazole (PRILOSEC) 40 MG capsule Take 1 capsule (40 mg total) by mouth daily. For 3 weeks then as needed  . PEDIASURE/FIBER (PEDIASURE/FIBER) LIQD Take 10 mLs by mouth.  . polyethylene glycol (MIRALAX / GLYCOLAX) packet Take 17 g by mouth daily.  . rosuvastatin (CRESTOR) 10 MG  tablet TAKE 1 TABLET(10 MG) BY MOUTH AT BEDTIME   No facility-administered encounter medications on file as of 05/30/2020.    Current Diagnosis: Patient Active Problem List   Diagnosis Date Noted  . Abdominal bloating 05/12/2020  . Syncope 03/31/2020  . 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  . Mass of leg, left 10/23/2018  . History of cerebellar stroke 10/08/2018  . Small vessel disease, 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  . Carotid stenosis, bilateral 10/08/2018  . Thyroid nodule 04/14/2018  . DNR no code (do not resuscitate) 03/20/2017  . Vaginal atrophy 02/18/2017  . Aortic atherosclerosis (HCC) 09/05/2016  . Decreased hearing, bilateral 12/04/2015  . Preventative health care 12/04/2015  . Hypertension   . 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 GERD (gastroesophageal reflux disease)   . OSA on CPAP   . Hypertrophy of nasal turbinates   . Hyperlipidemia 03/28/2015  . Allergic rhinitis 03/28/2015  . History of decompression of median nerve 02/14/2014  . Osteopenia 06/09/2013    Goals Addressed   None    Reviewed chart for medication changes ahead of medication coordination call.  OVs and Consults- 04/25/20- Carlena Sax (Endocrinology); 05/03/20-  Dr. Clydene Pugh (Orthopaedics); 05/12/20- Dr. Sharen Hones (PCP) since last care coordination call/Pharmacist visit.   Medication changes indicated on 05/12/20- Omeprazole 40 mg, 1 tablet daily for 3 weeks then as needed was added, Amlodipine 2.5 mg 1 tablet daily was discontinued(by Cardiologist), Amoxicillin 875 mg 2 tablets daily was discontinued, Calcium Carbonate 1500  mg 2 times daily with meals was discontinued, Magnesium Oxide 2 tablets every other morning was discontinued and Potassium 99 mg 1 every other day was discontinued.   BP Readings from Last 3 Encounters:  05/12/20 140/68  03/31/20 (!) 122/60  08/17/19 (!) 150/70    No results found for: HGBA1C   Patient obtains medications  through Adherence Packaging  90 Days   Last adherence delivery included: None needed, due to receiving a 90 day supply adherence packaging on 03/29/2020  Patient declined Amlodipine 2.5 mg one tablet daily, Donepezil 10 mg one tablet daily, Famotidine 20 mg one tablet twice daily, Rosuvastatin 10 mg one tablet daily, Memantine 10 mg t tablet twice daily and Flonase- 2 sprays each nostril as needed last month due to receiving a 90 days supply on 03/29/20.   Patient is due for next adherence delivery on: 06/27/2020.  Called patient and reviewed medications  Verified that there was not a need in any refills of any medications. Patient's spouse stated no medication needed they are doing well with adherence packaging and that they wanted to inform the pharmacy that her Amlodipine 2.5 mg was discontinued by her Cardiologist.    Follow-Up:  Coordination of Enhanced Pharmacy Services and Pharmacist Review- No medications needed. Cardiologist discontinued Amlodipine 2.5 mg, patient aware that we will update [facility] to have removed from patient's profile. Lucia Gaskins, CPP aware.  Billee Cashing, CMA Clinical Pharmacist Assistant [phone]

## 2020-05-31 DIAGNOSIS — R001 Bradycardia, unspecified: Secondary | ICD-10-CM | POA: Diagnosis not present

## 2020-05-31 DIAGNOSIS — R55 Syncope and collapse: Secondary | ICD-10-CM | POA: Diagnosis not present

## 2020-06-01 DIAGNOSIS — I6522 Occlusion and stenosis of left carotid artery: Secondary | ICD-10-CM | POA: Diagnosis not present

## 2020-06-01 DIAGNOSIS — R001 Bradycardia, unspecified: Secondary | ICD-10-CM | POA: Diagnosis not present

## 2020-06-01 DIAGNOSIS — I700 Atherosclerosis of aorta: Secondary | ICD-10-CM | POA: Diagnosis not present

## 2020-06-01 DIAGNOSIS — E782 Mixed hyperlipidemia: Secondary | ICD-10-CM | POA: Diagnosis not present

## 2020-06-01 DIAGNOSIS — G4733 Obstructive sleep apnea (adult) (pediatric): Secondary | ICD-10-CM | POA: Diagnosis not present

## 2020-06-01 DIAGNOSIS — R55 Syncope and collapse: Secondary | ICD-10-CM | POA: Diagnosis not present

## 2020-06-01 DIAGNOSIS — Z8673 Personal history of transient ischemic attack (TIA), and cerebral infarction without residual deficits: Secondary | ICD-10-CM | POA: Diagnosis not present

## 2020-06-01 DIAGNOSIS — K219 Gastro-esophageal reflux disease without esophagitis: Secondary | ICD-10-CM | POA: Diagnosis not present

## 2020-06-01 DIAGNOSIS — I10 Essential (primary) hypertension: Secondary | ICD-10-CM | POA: Diagnosis not present

## 2020-06-20 NOTE — Progress Notes (Addendum)
I have collaborated with the care management provider regarding care management and care coordination activities outlined in this encounter and have reviewed this encounter including documentation in the note and care plan. I am certifying that I agree with the content of this note and encounter as supervising physician.  

## 2020-06-26 DIAGNOSIS — G4733 Obstructive sleep apnea (adult) (pediatric): Secondary | ICD-10-CM | POA: Diagnosis not present

## 2020-06-29 ENCOUNTER — Telehealth: Payer: Self-pay

## 2020-06-29 NOTE — Chronic Care Management (AMB) (Signed)
Chronic Care Management Pharmacy Assistant   Name: Kathy Baldwin  MRN: [id number] DOB: Nov 20, 1945  Reason for Encounter: Medication Review/ Monthly Dispensing Call  Patient Questions:  1.  Have you seen any other providers since your last visit? Yes, 06/01/2020- Dr Cliffton Asters (Cardiology)  2.  Any changes in your medicines or health? No  PCP : [name], MD  Allergies:   Allergies  Allergen Reactions  . Cabbage Other (See Comments)    Hypersensitivity on allergy testing  . Chocolate Other (See Comments)    Hypersensitivity on allergy testing  . Statins Other (See Comments)    Zocor and Pravastatin--leg cramps Other reaction(s): Other (See Comments) Zocor and Pravastatin--leg cramps    Medications: Outpatient Encounter Medications as of 06/29/2020  Medication Sig  . aspirin EC 325 MG tablet Take 1 tablet (325 mg total) by mouth daily.  . Calcium Carb-Cholecalciferol (CALCIUM 600+D) 600-800 MG-UNIT TABS Take by mouth daily. 2 capsule daily  . COLLAGEN PO Take by mouth.  . conjugated estrogens (PREMARIN) vaginal cream Use a pea-sized amount externally and internally twice a week at night  . Cyanocobalamin (B-12) 2500 MCG TABS Take 1 tablet by mouth daily. Pt taking 1/2 of a 5000 mcg tablet  . donepezil (ARICEPT) 10 MG tablet Take 10 mg by mouth at bedtime.   . famotidine (PEPCID) 20 MG tablet Take 1 tablet (20 mg total) by mouth 2 (two) times daily as needed for heartburn or indigestion.  Marland Kitchen FIBER PO Take by mouth.  . fluticasone (FLONASE) 50 MCG/ACT nasal spray SHAKE LIQUID AND USE 2 SPRAYS IN EACH NOSTRIL DAILY  . memantine (NAMENDA) 10 MG tablet Take 10 mg by mouth 2 (two) times daily.  . Multiple Vitamin (MULTIVITAMIN) tablet Take 1 tablet by mouth daily.  . Multiple Vitamins-Minerals (HAIR/SKIN/NAILS/BIOTIN PO) Take 1 tablet by mouth daily.  Marland Kitchen omeprazole (PRILOSEC) 40 MG capsule Take 1 capsule (40 mg total) by mouth daily. For 3 weeks then as needed  .  PEDIASURE/FIBER (PEDIASURE/FIBER) LIQD Take 10 mLs by mouth.  . polyethylene glycol (MIRALAX / GLYCOLAX) packet Take 17 g by mouth daily.  . rosuvastatin (CRESTOR) 10 MG tablet TAKE 1 TABLET(10 MG) BY MOUTH AT BEDTIME   No facility-administered encounter medications on file as of 06/29/2020.    Current Diagnosis: Patient Active Problem List   Diagnosis Date Noted  . Abdominal bloating 05/12/2020  . Syncope 03/31/2020  . 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  . Mass of leg, left 10/23/2018  . History of cerebellar stroke 10/08/2018  . Small vessel disease, 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  . Carotid stenosis, bilateral 10/08/2018  . Thyroid nodule 04/14/2018  . DNR no code (do not resuscitate) 03/20/2017  . Vaginal atrophy 02/18/2017  . Aortic atherosclerosis (HCC) 09/05/2016  . Decreased hearing, bilateral 12/04/2015  . Preventative health care 12/04/2015  . Hypertension   . 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 GERD (gastroesophageal reflux disease)   . OSA on CPAP   . Hypertrophy of nasal turbinates   . Hyperlipidemia 03/28/2015  . Allergic rhinitis 03/28/2015  . History of decompression of median nerve 02/14/2014  . Osteopenia 06/09/2013   Reviewed chart for medication changes ahead of medication coordination call.  Consult- 06/01/2020 Dr Cliffton Asters (Cardiology) since last care coordination call/Pharmacist visit.  No medication changes indicated.  BP Readings from Last 3 Encounters:  05/12/20 140/68  03/31/20 (!) 122/60  08/17/19 (!) 150/70    No results found for:  HGBA1C   Patient obtains medications through Adherence Packaging  90 Days   Last adherence delivery included: None needed with last adherence call on 05/30/2020.  Patient declined the following medications last month:  Amlodipine 2.5 mg one tablet daily- due to being discontinued by Cardiologist.   Donepezil 10 mg one tablet daily, Famotidine 20 mg one tablet twice  daily, Rosuvastatin 10 mg one tablet daily, Memantine 10 mg t tablet twice daily and Flonase- 2 sprays each nostril as needed due to receiving a 90 days supply on 03/29/20.  Patient is due for next adherence delivery on: 06/30/2020. 06/29/2020- Called patient and reviewed medications and coordinated delivery.  This delivery to include:  Donepezil 10 mg one tablet daily, Famotidine 20 mg one tablet twice daily, Rosuvastatin 10 mg one tablet daily, Memantine 10 mg t tablet twice daily.  No Short fill or acute fill needed.   Patient declined the following medications  Amlodipine 2.5 mg one tablet daily due to being discontinued by Cardiologist on 04/07/2020 and Flonase- 2 sprays each nostril  due to PRN use and Omeprazole 40 mg given 05/15/2020 due to PRN use per GI- Dr Norma Fredrickson. Patient will let us know if Dr Norma Fredrickson wants her to continue Omeprazole when she sees him on 07/27/2020.   Confirmed delivery date of 06/30/2020, advised patient that pharmacy will contact them the morning of delivery.   Follow-Up:  Coordination of Enhanced Pharmacy Services and Pharmacist Review  Amlodipine was discontinued, Omeprazole is prn use until further notice. Patient has a follow up appointment with Dr Norma Fredrickson (GI) 07/27/2020.  Lucia Gaskins, CPP notified.  Billee Cashing, CMA Clinical Pharmacist Assistant [phone]

## 2020-07-07 DIAGNOSIS — H26492 Other secondary cataract, left eye: Secondary | ICD-10-CM | POA: Diagnosis not present

## 2020-07-26 ENCOUNTER — Telehealth: Payer: Self-pay

## 2020-07-26 NOTE — Progress Notes (Signed)
Chronic Care Management Pharmacy Assistant   Name: Kathy Baldwin  MRN: [id number] DOB: 06-05-1946  Reason for Encounter: Medication Review/Monthly Dispensing Call  PCP : [name], MD  Allergies:   Allergies  Allergen Reactions  . Cabbage Other (See Comments)    Hypersensitivity on allergy testing  . Chocolate Other (See Comments)    Hypersensitivity on allergy testing  . Statins Other (See Comments)    Zocor and Pravastatin--leg cramps Other reaction(s): Other (See Comments) Zocor and Pravastatin--leg cramps    Medications: Outpatient Encounter Medications as of 07/26/2020  Medication Sig  . aspirin EC 325 MG tablet Take 1 tablet (325 mg total) by mouth daily.  . Calcium Carb-Cholecalciferol (CALCIUM 600+D) 600-800 MG-UNIT TABS Take by mouth daily. 2 capsule daily  . COLLAGEN PO Take by mouth.  . conjugated estrogens (PREMARIN) vaginal cream Use a pea-sized amount externally and internally twice a week at night  . Cyanocobalamin (B-12) 2500 MCG TABS Take 1 tablet by mouth daily. Pt taking 1/2 of a 5000 mcg tablet  . donepezil (ARICEPT) 10 MG tablet Take 10 mg by mouth at bedtime.   . famotidine (PEPCID) 20 MG tablet Take 1 tablet (20 mg total) by mouth 2 (two) times daily as needed for heartburn or indigestion.  Marland Kitchen FIBER PO Take by mouth.  . fluticasone (FLONASE) 50 MCG/ACT nasal spray SHAKE LIQUID AND USE 2 SPRAYS IN EACH NOSTRIL DAILY  . memantine (NAMENDA) 10 MG tablet Take 10 mg by mouth 2 (two) times daily.  . Multiple Vitamin (MULTIVITAMIN) tablet Take 1 tablet by mouth daily.  . Multiple Vitamins-Minerals (HAIR/SKIN/NAILS/BIOTIN PO) Take 1 tablet by mouth daily.  Marland Kitchen omeprazole (PRILOSEC) 40 MG capsule Take 1 capsule (40 mg total) by mouth daily. For 3 weeks then as needed  . PEDIASURE/FIBER (PEDIASURE/FIBER) LIQD Take 10 mLs by mouth.  . polyethylene glycol (MIRALAX / GLYCOLAX) packet Take 17 g by mouth daily.  . rosuvastatin (CRESTOR) 10 MG tablet TAKE  1 TABLET(10 MG) BY MOUTH AT BEDTIME   No facility-administered encounter medications on file as of 07/26/2020.    Current Diagnosis: Patient Active Problem List   Diagnosis Date Noted  . Abdominal bloating 05/12/2020  . Syncope 03/31/2020  . 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  . Mass of leg, left 10/23/2018  . History of cerebellar stroke 10/08/2018  . Small vessel disease, 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  . Carotid stenosis, bilateral 10/08/2018  . Thyroid nodule 04/14/2018  . DNR no code (do not resuscitate) 03/20/2017  . Vaginal atrophy 02/18/2017  . Aortic atherosclerosis (HCC) 09/05/2016  . Decreased hearing, bilateral 12/04/2015  . Preventative health care 12/04/2015  . Hypertension   . 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 GERD (gastroesophageal reflux disease)   . OSA on CPAP   . Hypertrophy of nasal turbinates   . Hyperlipidemia 03/28/2015  . Allergic rhinitis 03/28/2015  . History of decompression of median nerve 02/14/2014  . Osteopenia 06/09/2013   Follow-Up:  Pharmacist Review   Reviewed chart for medication changes ahead of medication coordination call.  No OVs, Consults, or hospital visits since last care coordination call/Pharmacist visit.   No medication changes indicated   BP Readings from Last 3 Encounters:  05/12/20 140/68  03/31/20 (!) 122/60  08/17/19 (!) 150/70    No results found for: HGBA1C   Patient obtains medications through Adherence Packaging  90 Days   Last adherence delivery included: (medication name and frequency) Donepezil 10  mg onetabletdaily Famotidine 20 mg onetablet twice daily Rosuvastatin 10 mg onetabletdaily Memantine 10 mgt tablet twice daily.  Patient declined (meds) last month due to PRN use/additional supply on hand.  Amlodipine 2.5 mg onetabletdaily due to being discontinued by Cardiologist on 04/07/2020  Flonase- 2 sprays each nostril  due to PRN use    Omeprazole 40 mg given 05/15/2020 due to PRN use per GI- Dr Norma Fredrickson Patient will let us know if Dr Norma Fredrickson wants her to continue Omeprazole when she sees him on 07/27/2020.  Patient is due for next adherence delivery on: No Fill Needed . Called patient and reviewed medications and coordinated delivery.  This delivery to include:None ID  Patient declined the following medications (meds) due to (reason) Donepezil 10 mg onetabletdaily Famotidine 20 mg onetablet twice daily Rosuvastatin 10 mg onetabletdaily Memantine 10 mgt tablet twice daily. Flonase- 2 sprays each nostril  due to PRN use   Omeprazole 40 mg given 05/15/2020 due to PRN use per GI- Dr Norma Fredrickson Patient will let us know if Dr Norma Fredrickson wants her to continue Omeprazole when she sees him on 07/27/2020.  Patient needs refills for None ID.  Confirmed delivery date of No Fill Needed , advised patient that pharmacy will contact them the morning of delivery.  Everlean Cherry Clinical Pharmacist Assistant [phone]

## 2020-07-27 DIAGNOSIS — K219 Gastro-esophageal reflux disease without esophagitis: Secondary | ICD-10-CM | POA: Diagnosis not present

## 2020-07-27 DIAGNOSIS — R1031 Right lower quadrant pain: Secondary | ICD-10-CM | POA: Diagnosis not present

## 2020-07-27 DIAGNOSIS — Z66 Do not resuscitate: Secondary | ICD-10-CM | POA: Diagnosis not present

## 2020-07-27 DIAGNOSIS — K5909 Other constipation: Secondary | ICD-10-CM | POA: Diagnosis not present

## 2020-07-27 DIAGNOSIS — R140 Abdominal distension (gaseous): Secondary | ICD-10-CM | POA: Diagnosis not present

## 2020-07-27 DIAGNOSIS — R1032 Left lower quadrant pain: Secondary | ICD-10-CM | POA: Diagnosis not present

## 2020-08-01 ENCOUNTER — Encounter: Payer: Self-pay | Admitting: Internal Medicine

## 2020-08-04 ENCOUNTER — Other Ambulatory Visit: Admission: RE | Admit: 2020-08-04 | Payer: PPO | Source: Ambulatory Visit

## 2020-08-07 ENCOUNTER — Encounter: Admission: RE | Payer: Self-pay | Source: Home / Self Care

## 2020-08-07 ENCOUNTER — Ambulatory Visit: Admission: RE | Admit: 2020-08-07 | Payer: PPO | Source: Home / Self Care | Admitting: Internal Medicine

## 2020-08-07 DIAGNOSIS — Z01818 Encounter for other preprocedural examination: Secondary | ICD-10-CM | POA: Diagnosis not present

## 2020-08-07 SURGERY — ESOPHAGOGASTRODUODENOSCOPY (EGD) WITH PROPOFOL
Anesthesia: General

## 2020-08-08 DIAGNOSIS — R140 Abdominal distension (gaseous): Secondary | ICD-10-CM | POA: Diagnosis not present

## 2020-08-08 DIAGNOSIS — R1030 Lower abdominal pain, unspecified: Secondary | ICD-10-CM | POA: Diagnosis not present

## 2020-08-08 DIAGNOSIS — K2970 Gastritis, unspecified, without bleeding: Secondary | ICD-10-CM | POA: Diagnosis not present

## 2020-08-08 DIAGNOSIS — K2930 Chronic superficial gastritis without bleeding: Secondary | ICD-10-CM | POA: Diagnosis not present

## 2020-08-08 DIAGNOSIS — K2950 Unspecified chronic gastritis without bleeding: Secondary | ICD-10-CM | POA: Diagnosis not present

## 2020-08-08 DIAGNOSIS — K449 Diaphragmatic hernia without obstruction or gangrene: Secondary | ICD-10-CM | POA: Diagnosis not present

## 2020-08-08 DIAGNOSIS — K259 Gastric ulcer, unspecified as acute or chronic, without hemorrhage or perforation: Secondary | ICD-10-CM | POA: Diagnosis not present

## 2020-08-08 DIAGNOSIS — K219 Gastro-esophageal reflux disease without esophagitis: Secondary | ICD-10-CM | POA: Diagnosis not present

## 2020-08-09 HISTORY — PX: ESOPHAGOGASTRODUODENOSCOPY: SHX1529

## 2020-08-23 ENCOUNTER — Encounter: Payer: Self-pay | Admitting: Family Medicine

## 2020-08-23 ENCOUNTER — Ambulatory Visit (INDEPENDENT_AMBULATORY_CARE_PROVIDER_SITE_OTHER): Payer: PPO | Admitting: Family Medicine

## 2020-08-23 ENCOUNTER — Other Ambulatory Visit: Payer: Self-pay

## 2020-08-23 VITALS — BP 150/76 | HR 64 | Temp 97.7°F | Ht 62.5 in | Wt 137.8 lb

## 2020-08-23 DIAGNOSIS — M7062 Trochanteric bursitis, left hip: Secondary | ICD-10-CM | POA: Diagnosis not present

## 2020-08-23 MED ORDER — TRIAMCINOLONE ACETONIDE 40 MG/ML IJ SUSP
40.0000 mg | Freq: Once | INTRAMUSCULAR | Status: AC
  Administered 2020-08-23: 40 mg via INTRA_ARTICULAR

## 2020-08-23 NOTE — Progress Notes (Signed)
Kathy Baldwin T. Kathy Piper, MD, CAQ Sports Medicine  Primary Care and Sports Medicine [facility] [address] [place], [address]  Phone: [phone]  FAX: [phone]  Kathy Baldwin - 74 y.o. female  MRN [id number]  Date of Birth: 1946/03/20  Date: 08/23/2020  PCP: [name], MD  Referral: [name], MD  Chief Complaint  Patient presents with  . Hip Pain    Left Hip.     This visit occurred during the SARS-CoV-2 public health emergency.  Safety protocols were in place, including screening questions prior to the visit, additional usage of staff PPE, and extensive cleaning of exam room while observing appropriate contact time as indicated for disinfecting solutions.   Subjective:   Kathy Baldwin is a 74 y.o. very pleasant female patient with Body mass index is 24.8 kg/m. who presents with the following:  Patient complaints of lateral hip pain that has been ongoing now for about 1-2 months. No distinct trauma or injury. No groin pain. No back pain. No radiculopathy. No numbness or acknowledged weakness. Prior operative interventions or fractures in the joints above or below: none Prior meds: OTC NSAIDS and Tylenol HEP or PT: none Prior injection: none Activity level: mild No injury.  Went on a cruise last week  L lateral mainly   Review of Systems is noted in the HPI, as appropriate   Objective:   BP (!) 150/76 (BP Location: Left Arm, Patient Position: Sitting)   Pulse 64   Temp 97.7 F (36.5 C)   Ht 5' 2.5" (1.588 m)   Wt 137 lb 12.8 oz (62.5 kg)   SpO2 98%   BMI 24.80 kg/m    HIP EXAM: SIDE: L ROM: Abduction, Flexion, Internal and External range of motion: full Pain with terminal IROM and EROM: none GTB: TTP SLR: NEG Knees: No effusion FABER: NT REVERSE FABER: NT, neg Piriformis: NT at direct palpation Str: flexion: 5/5 abduction: 5/5 adduction: 5/5 Strength testing non-tender      Radiology: No results found.  Assessment and Plan:     ICD-10-CM   1. Trochanteric bursitis of left hip  M70.62 triamcinolone acetonide (KENALOG-40) injection 40 mg   I reviewed with the patient the structures involved and how they related to diagnosis.   Basic str was discussed, but her str is fairly good right now.  Trochanteric bursa injections have clearly been shown to help with acute bursitis, and the patient would benefit.   Aspiration/Injection Procedure Note Kathy Baldwin 02-27-1946 Date of procedure: 08/23/2020  Procedure: Large Joint Aspiration / Injection of Hip, Trochanteric Bursa, L Indications: Pain  Procedure Details Verbal consent obtained. Risks, benefits, and alternatives reviewed. Greater trochanter sterilely prepped with Chloraprep. Ethyl Chloride used for anesthesia. 9 cc of Lidocaine 1% injected with 1 mL of Kenalog 40 mg into trochanteric bursa at area of maximal tenderness at greater trochanter. Needle taken to bone to troch bursa, flows easily. Bursa massaged. No bleeding and no complications. Decreased pain after injection. Needle: 22 gauge spinal needle Medication: 1 mL of Kenalog 40 mg   Meds ordered this encounter  Medications  . triamcinolone acetonide (KENALOG-40) injection 40 mg   Signed,  Vann Okerlund T. Emiliana Blaize, MD   Outpatient Encounter Medications as of 08/23/2020  Medication Sig  . aspirin EC 325 MG tablet Take 1 tablet (325 mg total) by mouth daily.  . Calcium Carb-Cholecalciferol (CALCIUM 600+D) 600-800 MG-UNIT TABS Take by mouth daily. 2 capsule daily  .  COLLAGEN PO Take by mouth.  . conjugated estrogens (PREMARIN) vaginal cream Use a pea-sized amount externally and internally twice a week at night  . Cyanocobalamin (B-12) 2500 MCG TABS Take 1 tablet by mouth daily. Pt taking 1/2 of a 5000 mcg tablet  . donepezil (ARICEPT) 10 MG tablet Take 10 mg by mouth at bedtime.   . famotidine (PEPCID) 20 MG tablet Take 1 tablet (20 mg  total) by mouth 2 (two) times daily as needed for heartburn or indigestion.  Marland Kitchen FIBER PO Take by mouth.  . fluticasone (FLONASE) 50 MCG/ACT nasal spray SHAKE LIQUID AND USE 2 SPRAYS IN EACH NOSTRIL DAILY  . meclizine (ANTIVERT) 25 MG tablet Take 25 mg by mouth 3 (three) times daily as needed for dizziness.  . memantine (NAMENDA) 10 MG tablet Take 10 mg by mouth 2 (two) times daily.  . Multiple Vitamin (MULTIVITAMIN) tablet Take 1 tablet by mouth daily.  Marland Kitchen omeprazole (PRILOSEC) 40 MG capsule Take 1 capsule (40 mg total) by mouth daily. For 3 weeks then as needed  . PEDIASURE/FIBER (PEDIASURE/FIBER) LIQD Take 10 mLs by mouth.  . polyethylene glycol (MIRALAX / GLYCOLAX) packet Take 17 g by mouth daily.  . rosuvastatin (CRESTOR) 10 MG tablet TAKE 1 TABLET(10 MG) BY MOUTH AT BEDTIME  . Multiple Vitamins-Minerals (HAIR/SKIN/NAILS/BIOTIN PO) Take 1 tablet by mouth daily. (Patient not taking: Reported on 08/23/2020)  . [EXPIRED] triamcinolone acetonide (KENALOG-40) injection 40 mg    No facility-administered encounter medications on file as of 08/23/2020.

## 2020-08-24 ENCOUNTER — Telehealth: Payer: Self-pay

## 2020-08-24 NOTE — Chronic Care Management (AMB) (Signed)
Chronic Care Management Pharmacy Assistant   Name: Kathy Baldwin  MRN: [id number] DOB: [birth year]  Reason for Encounter: Medication Review  Patient Questions:  1.  Have you seen any other providers since your last visit? Yes,  08/23/20 Dr. Karleen Hampshire Copland- Family practice  07/27/20 Dr. Rosina Lowenstein- Gastroenterology   2.  Any changes in your medicines or health? No   PCP : [name], MD  Allergies:   Allergies  Allergen Reactions  . Cabbage Other (See Comments)    Hypersensitivity on allergy testing  . Chocolate Other (See Comments)    Hypersensitivity on allergy testing  . Statins Other (See Comments)    Zocor and Pravastatin--leg cramps Other reaction(s): Other (See Comments) Zocor and Pravastatin--leg cramps    Medications: Outpatient Encounter Medications as of 08/24/2020  Medication Sig  . aspirin EC 325 MG tablet Take 1 tablet (325 mg total) by mouth daily.  . Calcium Carb-Cholecalciferol (CALCIUM 600+D) 600-800 MG-UNIT TABS Take by mouth daily. 2 capsule daily  . COLLAGEN PO Take by mouth.  . conjugated estrogens (PREMARIN) vaginal cream Use a pea-sized amount externally and internally twice a week at night  . Cyanocobalamin (B-12) 2500 MCG TABS Take 1 tablet by mouth daily. Pt taking 1/2 of a 5000 mcg tablet  . donepezil (ARICEPT) 10 MG tablet Take 10 mg by mouth at bedtime.   . famotidine (PEPCID) 20 MG tablet Take 1 tablet (20 mg total) by mouth 2 (two) times daily as needed for heartburn or indigestion.  Marland Kitchen FIBER PO Take by mouth.  . fluticasone (FLONASE) 50 MCG/ACT nasal spray SHAKE LIQUID AND USE 2 SPRAYS IN EACH NOSTRIL DAILY  . meclizine (ANTIVERT) 25 MG tablet Take 25 mg by mouth 3 (three) times daily as needed for dizziness.  . memantine (NAMENDA) 10 MG tablet Take 10 mg by mouth 2 (two) times daily.  . Multiple Vitamin (MULTIVITAMIN) tablet Take 1 tablet by mouth daily.  . Multiple Vitamins-Minerals (HAIR/SKIN/NAILS/BIOTIN PO) Take 1  tablet by mouth daily. (Patient not taking: Reported on 08/23/2020)  . omeprazole (PRILOSEC) 40 MG capsule Take 1 capsule (40 mg total) by mouth daily. For 3 weeks then as needed  . PEDIASURE/FIBER (PEDIASURE/FIBER) LIQD Take 10 mLs by mouth.  . polyethylene glycol (MIRALAX / GLYCOLAX) packet Take 17 g by mouth daily.  . rosuvastatin (CRESTOR) 10 MG tablet TAKE 1 TABLET(10 MG) BY MOUTH AT BEDTIME   No facility-administered encounter medications on file as of 08/24/2020.    Current Diagnosis: Patient Active Problem List   Diagnosis Date Noted  . Abdominal bloating 05/12/2020  . Syncope 03/31/2020  . 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  . Mass of leg, left 10/23/2018  . History of cerebellar stroke 10/08/2018  . Small vessel disease, 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  . Carotid stenosis, bilateral 10/08/2018  . Thyroid nodule 04/14/2018  . DNR no code (do not resuscitate) 03/20/2017  . Vaginal atrophy 02/18/2017  . Aortic atherosclerosis (HCC) 09/05/2016  . Decreased hearing, bilateral 12/04/2015  . Preventative health care 12/04/2015  . Hypertension   . 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 GERD (gastroesophageal reflux disease)   . OSA on CPAP   . Hypertrophy of nasal turbinates   . Hyperlipidemia 03/28/2015  . Allergic rhinitis 03/28/2015  . History of decompression of median nerve 02/14/2014  . Osteopenia 06/09/2013   Reviewed chart for medication changes ahead of medication coordination call.  OVs include 08/23/20 Dr. Karleen Hampshire Copland- Family  practice. Consults include 07/27/20 Dr. Rosina Lowenstein- Gastroenterology, no hospital visits since last care coordination call/Pharmacist visit. No medication changes indicated.   BP Readings from Last 3 Encounters:  08/23/20 (!) 150/76  05/12/20 140/68  03/31/20 (!) 122/60    No results found for: HGBA1C   Patient obtains medications through Adherence Packaging  90 Days   Last  adherence delivery included: NONE  Patient declined the following medications last month . Donepezil 10 mg onetabletdaily Famotidine 20 mg onetablet twice daily Flonase- 2 sprays each nostrildue to PRN use Memantine 10 mgt tablet twice daily. Norvasc 2.5 mg tablet every morning Omeprazole 40 mg given 05/15/2020 due to PRN use per GI- Dr Norma Fredrickson Rosuvastatin 10 mg onetabletdaily   Patient is due for next adherence delivery on: 08/31/20 - No medications needed . Called patient and reviewed medications and coordinated delivery.  This delivery to include: NONE  Patient will not need a short or acute  fill of medications, prior to adherence delivery.    Patient declined the following medications: Donepezil 10 mg onetabletat (bedtime) due to 90 DS filled 06/29/20 Famotidine 20 mg onetablet twice daily (breakfast and bedtime)  due to 90 DS filled 06/29/20 Flonase- 2 sprays each nostrildue to PRN use Memantine 10 mgt tablet twice daily. (breakfast and bedtime)  due to 90 DS filled 06/29/20 Norvasc 2.5 mg tablet every morning (breakfast)- States Dr. Juliann Pares took her off 6 weeks ago. Omeprazole 40 mg given  (breakfast)  due to 90 DS filled 07/31/20 Rosuvastatin 10 mg onetabletdaily (bedtime)  due to 90 DS filled 06/29/20   Patient needs refills for the following medications: NONE      No medications for delivery date of 08/31/20.    Follow-Up:  Occupational hygienist and Pharmacist Review   Phil Dopp, CPP notified  Kathy Baldwin, [facility] Clinical Pharmacy Assistant [phone]

## 2020-08-25 ENCOUNTER — Ambulatory Visit: Payer: PPO | Admitting: Family Medicine

## 2020-08-30 ENCOUNTER — Encounter: Payer: Self-pay | Admitting: Family Medicine

## 2020-09-11 DIAGNOSIS — I639 Cerebral infarction, unspecified: Secondary | ICD-10-CM | POA: Diagnosis not present

## 2020-09-11 DIAGNOSIS — G309 Alzheimer's disease, unspecified: Secondary | ICD-10-CM | POA: Diagnosis not present

## 2020-09-11 DIAGNOSIS — R001 Bradycardia, unspecified: Secondary | ICD-10-CM | POA: Diagnosis not present

## 2020-09-11 DIAGNOSIS — I700 Atherosclerosis of aorta: Secondary | ICD-10-CM | POA: Diagnosis not present

## 2020-09-11 DIAGNOSIS — I10 Essential (primary) hypertension: Secondary | ICD-10-CM | POA: Diagnosis not present

## 2020-09-11 DIAGNOSIS — I6522 Occlusion and stenosis of left carotid artery: Secondary | ICD-10-CM | POA: Diagnosis not present

## 2020-09-11 DIAGNOSIS — R55 Syncope and collapse: Secondary | ICD-10-CM | POA: Diagnosis not present

## 2020-09-11 DIAGNOSIS — F0280 Dementia in other diseases classified elsewhere without behavioral disturbance: Secondary | ICD-10-CM | POA: Diagnosis not present

## 2020-09-11 DIAGNOSIS — G4733 Obstructive sleep apnea (adult) (pediatric): Secondary | ICD-10-CM | POA: Diagnosis not present

## 2020-09-11 DIAGNOSIS — E782 Mixed hyperlipidemia: Secondary | ICD-10-CM | POA: Diagnosis not present

## 2020-09-11 DIAGNOSIS — Z8673 Personal history of transient ischemic attack (TIA), and cerebral infarction without residual deficits: Secondary | ICD-10-CM | POA: Diagnosis not present

## 2020-09-11 DIAGNOSIS — K219 Gastro-esophageal reflux disease without esophagitis: Secondary | ICD-10-CM | POA: Diagnosis not present

## 2020-09-19 ENCOUNTER — Telehealth: Payer: Self-pay

## 2020-09-19 NOTE — Chronic Care Management (AMB) (Incomplete Revision)
Chronic Care Management Pharmacy Assistant   Name: Kathy Baldwin  MRN: [id number] DOB: [birth year]  Reason for Encounter: Medication Adherence and Delivery Coordination  Patient Questions:  1.  Have you seen any other providers since your last visit? No  2.  Any changes in your medicines or health? No  PCP : [name], MD  Allergies:   Allergies  Allergen Reactions   Cabbage Other (See Comments)    Hypersensitivity on allergy testing   Chocolate Other (See Comments)    Hypersensitivity on allergy testing   Statins Other (See Comments)    Zocor and Pravastatin--leg cramps Other reaction(s): Other (See Comments) Zocor and Pravastatin--leg cramps    Medications: Outpatient Encounter Medications as of 09/19/2020  Medication Sig   aspirin EC 325 MG tablet Take 1 tablet (325 mg total) by mouth daily.   Calcium Carb-Cholecalciferol (CALCIUM 600+D) 600-800 MG-UNIT TABS Take by mouth daily. 2 capsule daily   COLLAGEN PO Take by mouth.   conjugated estrogens (PREMARIN) vaginal cream Use a pea-sized amount externally and internally twice a week at night   Cyanocobalamin (B-12) 2500 MCG TABS Take 1 tablet by mouth daily. Pt taking 1/2 of a 5000 mcg tablet   donepezil (ARICEPT) 10 MG tablet Take 10 mg by mouth at bedtime.    famotidine (PEPCID) 20 MG tablet Take 1 tablet (20 mg total) by mouth 2 (two) times daily as needed for heartburn or indigestion.   FIBER PO Take by mouth.   fluticasone (FLONASE) 50 MCG/ACT nasal spray SHAKE LIQUID AND USE 2 SPRAYS IN EACH NOSTRIL DAILY   meclizine (ANTIVERT) 25 MG tablet Take 25 mg by mouth 3 (three) times daily as needed for dizziness.   memantine (NAMENDA) 10 MG tablet Take 10 mg by mouth 2 (two) times daily.   Multiple Vitamin (MULTIVITAMIN) tablet Take 1 tablet by mouth daily.   Multiple Vitamins-Minerals (HAIR/SKIN/NAILS/BIOTIN PO) Take 1 tablet by mouth daily. (Patient not taking: Reported on 08/23/2020)   omeprazole  (PRILOSEC) 40 MG capsule Take 1 capsule (40 mg total) by mouth daily. For 3 weeks then as needed   PEDIASURE/FIBER (PEDIASURE/FIBER) LIQD Take 10 mLs by mouth.   polyethylene glycol (MIRALAX / GLYCOLAX) packet Take 17 g by mouth daily.   rosuvastatin (CRESTOR) 10 MG tablet TAKE 1 TABLET(10 MG) BY MOUTH AT BEDTIME   No facility-administered encounter medications on file as of 09/19/2020.    Current Diagnosis: Patient Active Problem List   Diagnosis Date Noted   Abdominal bloating 05/12/2020   Syncope 03/31/2020   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   Mass of leg, left 10/23/2018   History of cerebellar stroke 10/08/2018   Small vessel disease, 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   Carotid stenosis, bilateral 10/08/2018   Thyroid nodule 04/14/2018   DNR no code (do not resuscitate) 03/20/2017   Vaginal atrophy 02/18/2017   Aortic atherosclerosis (HCC) 09/05/2016   Decreased hearing, bilateral 12/04/2015   Preventative health care 12/04/2015   Hypertension    ASCUS with 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  OV 09/26/20 with Neurology - restart amlodipine 2.5 mg daily   BP Readings from Last 3 Encounters:  08/23/20 (!) 150/76  05/12/20 140/68  03/31/20 (!) 122/60    Patient obtains medications through Adherence Packaging  90 Days   Patient is due for next adherence delivery on: 09/29/20.  Called patient and reviewed medications and coordinated delivery.  This delivery to include: Donepezil 10 mg one tablet daily Famotidine 20 mg one tablet twice daily Memantine 10 mg t tablet twice daily. Rosuvastatin 10 mg one tablet daily  Patient  will not need a short or acute fill, prior to adherence delivery.   Patient declined the following medications: Flonase- 2 sprays each nostril  due to PRN use Norvasc 2.5 mg tablet every morning D/C per Dr. Juliann Pares Omeprazole 40 mg 90 DS 07/31/2020   Patient needs refills for Rosuvastatin - Dr. Sharen Hones  Confirmed delivery date of 09/29/20, advised patient that pharmacy will contact them the morning of delivery.  Patient states she does not check blood pressure often. Most recent readings are as follows: 09/07/20 - 177/83 09/11/20- 134/65 09/14/20- 144/76  Follow-Up:  Coordination of Enhanced Pharmacy Services and Pharmacist Review   Phil Dopp, CPP notified  Jomarie Longs, [facility] Clinical Pharmacy Assistant [phone]

## 2020-09-19 NOTE — Chronic Care Management (AMB) (Addendum)
Chronic Care Management Pharmacy Assistant   Name: Kathy Baldwin  MRN: [id number] DOB: [birth year]  Reason for Encounter: Medication Adherence and Delivery Coordination  Patient Questions:  1.  Have you seen any other providers since your last visit? No  2.  Any changes in your medicines or health? No  PCP : [name], MD  Allergies:   Allergies  Allergen Reactions   Cabbage Other (See Comments)    Hypersensitivity on allergy testing   Chocolate Other (See Comments)    Hypersensitivity on allergy testing   Statins Other (See Comments)    Zocor and Pravastatin--leg cramps Other reaction(s): Other (See Comments) Zocor and Pravastatin--leg cramps    Medications: Outpatient Encounter Medications as of 09/19/2020  Medication Sig   aspirin EC 325 MG tablet Take 1 tablet (325 mg total) by mouth daily.   Calcium Carb-Cholecalciferol (CALCIUM 600+D) 600-800 MG-UNIT TABS Take by mouth daily. 2 capsule daily   COLLAGEN PO Take by mouth.   conjugated estrogens (PREMARIN) vaginal cream Use a pea-sized amount externally and internally twice a week at night   Cyanocobalamin (B-12) 2500 MCG TABS Take 1 tablet by mouth daily. Pt taking 1/2 of a 5000 mcg tablet   donepezil (ARICEPT) 10 MG tablet Take 10 mg by mouth at bedtime.    famotidine (PEPCID) 20 MG tablet Take 1 tablet (20 mg total) by mouth 2 (two) times daily as needed for heartburn or indigestion.   FIBER PO Take by mouth.   fluticasone (FLONASE) 50 MCG/ACT nasal spray SHAKE LIQUID AND USE 2 SPRAYS IN EACH NOSTRIL DAILY   meclizine (ANTIVERT) 25 MG tablet Take 25 mg by mouth 3 (three) times daily as needed for dizziness.   memantine (NAMENDA) 10 MG tablet Take 10 mg by mouth 2 (two) times daily.   Multiple Vitamin (MULTIVITAMIN) tablet Take 1 tablet by mouth daily.   Multiple Vitamins-Minerals (HAIR/SKIN/NAILS/BIOTIN PO) Take 1 tablet by mouth daily. (Patient not taking: Reported on 08/23/2020)   omeprazole  (PRILOSEC) 40 MG capsule Take 1 capsule (40 mg total) by mouth daily. For 3 weeks then as needed   PEDIASURE/FIBER (PEDIASURE/FIBER) LIQD Take 10 mLs by mouth.   polyethylene glycol (MIRALAX / GLYCOLAX) packet Take 17 g by mouth daily.   rosuvastatin (CRESTOR) 10 MG tablet TAKE 1 TABLET(10 MG) BY MOUTH AT BEDTIME   No facility-administered encounter medications on file as of 09/19/2020.    Current Diagnosis: Patient Active Problem List   Diagnosis Date Noted   Abdominal bloating 05/12/2020   Syncope 03/31/2020   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   Mass of leg, left 10/23/2018   History of cerebellar stroke 10/08/2018   Small vessel disease, 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   Carotid stenosis, bilateral 10/08/2018   Thyroid nodule 04/14/2018   DNR no code (do not resuscitate) 03/20/2017   Vaginal atrophy 02/18/2017   Aortic atherosclerosis (HCC) 09/05/2016   Decreased hearing, bilateral 12/04/2015   Preventative health care 12/04/2015   Hypertension    ASCUS with 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  OV 09/26/20 with Neurology - restart amlodipine 2.5 mg daily   BP Readings from Last 3 Encounters:  08/23/20 (!) 150/76  05/12/20 140/68  03/31/20 (!) 122/60    Patient obtains medications through Adherence Packaging  90 Days   Patient is due for next adherence delivery on: 09/29/20.  Called patient and reviewed medications and coordinated delivery.  This delivery to include: Amlodipine 2.5 mg one daily - 1 breakfast (RESTARTED 09/26/20 per neurology, waiting on Rx) Donepezil 10 mg one tablet daily Famotidine 20 mg one tablet twice  daily Memantine 10 mg t tablet twice daily. Rosuvastatin 10 mg one tablet daily  Patient will not need a short or acute fill, prior to adherence delivery.   Patient declined the following medications: Flonase- 2 sprays each nostril  due to PRN use Norvasc 2.5 mg tablet every morning D/C per Dr. Juliann Pares Omeprazole 40 mg 90 DS 07/31/2020   Patient needs refills for Rosuvastatin - Dr. Sharen Hones, amlodipine - Neurology  Confirmed delivery date of 09/29/20, advised patient that pharmacy will contact them the morning of delivery.  Patient states she does not check blood pressure often. Most recent readings are as follows: 09/07/20 - 177/83 09/11/20- 134/65 09/14/20- 144/76  Follow-Up:  Coordination of Enhanced Pharmacy Services and Pharmacist Review   Phil Dopp, CPP notified  Jomarie Longs, [facility] Clinical Pharmacy Assistant [phone]  Reviewed home BP monitoring and chart. Pt was advised to restart amlodipine due to elevations on 09/26/20. Rx was not sent to [facility]. Called neuro for prescription so patient could receive it with other packaged meds. Review BP log next month.  Phil Dopp, PharmD Clinical Pharmacist [facility] [place] [phone]

## 2020-09-20 ENCOUNTER — Telehealth: Payer: Self-pay | Admitting: Family Medicine

## 2020-09-20 ENCOUNTER — Telehealth: Payer: Self-pay

## 2020-09-20 MED ORDER — ROSUVASTATIN CALCIUM 10 MG PO TABS: ORAL_TABLET | ORAL | 0 refills | Status: DC

## 2020-09-20 NOTE — Telephone Encounter (Signed)
-----   Message from [place], [facility] sent at 09/20/2020  8:44 AM EST ----- Regarding: Refill Request Refills needed for rosuvastatin to [facility]. (90 day supply preferred)  Thank you,  Phil Dopp, PharmD Clinical Pharmacist [facility] [phone]

## 2020-09-20 NOTE — Telephone Encounter (Signed)
E-scribed refill 

## 2020-09-20 NOTE — Telephone Encounter (Signed)
LVM for pt to rtn my call to schedule awv with nha. 

## 2020-09-26 DIAGNOSIS — Z8679 Personal history of other diseases of the circulatory system: Secondary | ICD-10-CM | POA: Diagnosis not present

## 2020-09-26 DIAGNOSIS — R413 Other amnesia: Secondary | ICD-10-CM | POA: Diagnosis not present

## 2020-09-27 DIAGNOSIS — G4733 Obstructive sleep apnea (adult) (pediatric): Secondary | ICD-10-CM | POA: Diagnosis not present

## 2020-10-03 DIAGNOSIS — Z20822 Contact with and (suspected) exposure to covid-19: Secondary | ICD-10-CM | POA: Diagnosis not present

## 2020-10-19 ENCOUNTER — Other Ambulatory Visit: Payer: Self-pay | Admitting: Family Medicine

## 2020-10-19 MED ORDER — PREMARIN 0.625 MG/GM VA CREA: TOPICAL_CREAM | VAGINAL | 1 refills | Status: DC

## 2020-10-24 ENCOUNTER — Telehealth: Payer: Self-pay

## 2020-10-24 NOTE — Chronic Care Management (AMB) (Addendum)
Chronic Care Management Pharmacy Assistant   Name: Kathy Baldwin  MRN: [id number] DOB: 1945/10/03  Reason for Encounter: Medication Review- Medication Adherence and Delivery Coordination  Patient Question:  1.  Have you seen any other providers since your last visit? Yes  09/26/20- Dr. Theora Master- Neurology- Restarted patient on Amlodipine 2.5 mg  PCP : [name], MD  Allergies:   Allergies  Allergen Reactions   Cabbage Other (See Comments)    Hypersensitivity on allergy testing   Chocolate Other (See Comments)    Hypersensitivity on allergy testing   Statins Other (See Comments)    Zocor and Pravastatin--leg cramps Other reaction(s): Other (See Comments) Zocor and Pravastatin--leg cramps    Medications: Outpatient Encounter Medications as of 10/24/2020  Medication Sig   aspirin EC 325 MG tablet Take 1 tablet (325 mg total) by mouth daily.   Calcium Carb-Cholecalciferol (CALCIUM 600+D) 600-800 MG-UNIT TABS Take by mouth daily. 2 capsule daily   COLLAGEN PO Take by mouth.   conjugated estrogens (PREMARIN) vaginal cream Use a pea-sized amount externally and internally twice a week at night   Cyanocobalamin (B-12) 2500 MCG TABS Take 1 tablet by mouth daily. Pt taking 1/2 of a 5000 mcg tablet   donepezil (ARICEPT) 10 MG tablet Take 10 mg by mouth at bedtime.    famotidine (PEPCID) 20 MG tablet Take 1 tablet (20 mg total) by mouth 2 (two) times daily as needed for heartburn or indigestion.   FIBER PO Take by mouth.   fluticasone (FLONASE) 50 MCG/ACT nasal spray SHAKE LIQUID AND USE 2 SPRAYS IN EACH NOSTRIL DAILY   meclizine (ANTIVERT) 25 MG tablet Take 25 mg by mouth 3 (three) times daily as needed for dizziness.   memantine (NAMENDA) 10 MG tablet Take 10 mg by mouth 2 (two) times daily.   Multiple Vitamin (MULTIVITAMIN) tablet Take 1 tablet by mouth daily.   Multiple Vitamins-Minerals (HAIR/SKIN/NAILS/BIOTIN PO) Take 1 tablet by mouth daily. (Patient not taking:  Reported on 08/23/2020)   omeprazole (PRILOSEC) 40 MG capsule Take 1 capsule (40 mg total) by mouth daily. For 3 weeks then as needed   PEDIASURE/FIBER (PEDIASURE/FIBER) LIQD Take 10 mLs by mouth.   polyethylene glycol (MIRALAX / GLYCOLAX) packet Take 17 g by mouth daily.   rosuvastatin (CRESTOR) 10 MG tablet TAKE 1 TABLET(10 MG) BY MOUTH AT BEDTIME   No facility-administered encounter medications on file as of 10/24/2020.    Current Diagnosis: Patient Active Problem List   Diagnosis Date Noted   Abdominal bloating 05/12/2020   Syncope 03/31/2020   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   Mass of leg, left 10/23/2018   History of cerebellar stroke 10/08/2018   Small vessel disease, 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   Carotid stenosis, bilateral 10/08/2018   Thyroid nodule 04/14/2018   DNR no code (do not resuscitate) 03/20/2017   Vaginal atrophy 02/18/2017   Aortic atherosclerosis (HCC) 09/05/2016   Decreased hearing, bilateral 12/04/2015   Preventative health care 12/04/2015   Hypertension    ASCUS with positive high risk human papillomavirus of vagina    GERD (gastroesophageal reflux disease)    OSA on CPAP    Hypertrophy of nasal turbinates    Hyperlipidemia 03/28/2015   Allergic rhinitis 03/28/2015   History of decompression of median nerve 02/14/2014   Osteopenia 06/09/2013   Reviewed chart for medication changes ahead of medication coordination call.  Recent OV, Consult or Hospital visit: Dr. Herma Carson. Potter Recent medication changes indicated:  Restarted Amlodipine 2.5  mg   BP Readings from Last 3 Encounters:  08/23/20 (!) 150/76  05/12/20 140/68  03/31/20 (!) 122/60    No results found for: HGBA1C   Patient obtains medications through Adherence Packaging  90 Days   Last adherence delivery date: 09/29/20    Amlodipine 2.5 mg one daily - 1 breakfast (RESTARTED 09/26/20 per neurology.) Donepezil 10 mg one daily - 1 bedtime Famotidine 20 mg one daily - 1  breakfast, 1 evening meal Memantine 10 mg - 1 breakfast, 1 evening meal   Rosuvastatin 10 mg one daily - 1 bedtime  Patient declined the following medications last delivery: Flonase- 2 sprays each nostril  due to PRN use Omeprazole 40 mg due to PRN use     Patient is due for next adherence delivery on: 10/31/20- No adherence medications needed  Contacted patient and reviewed medications to ensure patient does not need any medications at this time.  This delivery to include: No medications  Patient declined the following medications this month: All maintenance medications due to receiving a 90 Days on 09/29/20 PRN/Vials Flonase- 2 sprays each nostril  due to PRN use Omeprazole 40 mg due to PRN use   Patient does not need refills on any medications  No medications needed for delivery date of 10/31/20.   Patient states she has not checked her blood pressure in the last several days and can not recall what her blood pressure was last time she checked it. Patient states she will check blood pressure starting today and call back Friday with updated readings.   Follow-Up:  Coordination of Enhanced Pharmacy Services and Pharmacist Review  Phil Dopp, CPP notified  Jomarie Longs, [facility] Clinical Pharmacy Assistant [phone]  I have reviewed the care management and care coordination activities outlined in this encounter and I am certifying that I agree with the content of this note. No further action required.  Phil Dopp, PharmD Clinical Pharmacist [facility] at [facility] [phone]

## 2020-10-31 NOTE — Chronic Care Management (AMB) (Signed)
Ms. Staebell called to update blood pressure log.  . Current antihypertensive regimen:  o Amlodipine 2.5 mg- 1 daily  . How often are you checking your Blood Pressure? infrequently  . she checks her blood pressure in the afternoon after taking her medication.  . Current home BP readings:  DATE:             BP               PULSE 10/25/20 139/59  NA 10/26/20 127/59  NA 10/27/20 131/61  NA 10/30/20 137/61  NA   - states she checked BP shortly after folding laundry this day.    . Wrist or arm cuff: Wrist . Caffeine intake: Coffee and tea . Salt intake: Limits salt. Does not add to food . OTC medications including pseudoephedrine or NSAIDs? No  Follow-Up:  Pharmacist Review  Phil Dopp, CPP notified  Jomarie Longs, [facility] Clinical Pharmacy Assistant [phone]   Total Time spent for month: 34

## 2020-12-13 ENCOUNTER — Encounter: Payer: Self-pay | Admitting: Family Medicine

## 2020-12-13 ENCOUNTER — Ambulatory Visit (INDEPENDENT_AMBULATORY_CARE_PROVIDER_SITE_OTHER): Payer: PPO | Admitting: Family Medicine

## 2020-12-13 ENCOUNTER — Other Ambulatory Visit: Payer: Self-pay

## 2020-12-13 DIAGNOSIS — L821 Other seborrheic keratosis: Secondary | ICD-10-CM | POA: Diagnosis not present

## 2020-12-13 NOTE — Progress Notes (Signed)
Patient ID: Kathy Baldwin, female    DOB: 1945-11-20, 75 y.o.   MRN: [id number]  This visit was conducted in person.  BP 134/68   Pulse 74   Temp 97.8 F (36.6 C) (Temporal)   Ht 5' 2.5" (1.588 m)   Wt 143 lb 9 oz (65.1 kg)   SpO2 96%   BMI 25.84 kg/m    CC: check moles  Subjective:   HPI: Kathy Baldwin is a 75 y.o. female presenting on 12/13/2020 for Nevus (Wants Dr. Reece Agar to look at some moles.  Pt accompanied by husband, Kathy Baldwin- temp 98.0.)   Several spots on skin present for several years - present on neck, abdomen, back, one that's scaly on R breast becoming irritated, can get stuck on clothing.  Not tender or itchy.  Hasn't tried medication or cream for this.  No new lotions, detergents, soaps or shampoos.      Relevant past medical, surgical, family and social history reviewed and updated as indicated. Interim medical history since our last visit reviewed. Allergies and medications reviewed and updated. Outpatient Medications Prior to Visit  Medication Sig Dispense Refill  . aspirin EC 325 MG tablet Take 1 tablet (325 mg total) by mouth daily.    . Calcium Carb-Cholecalciferol (CALCIUM 600+D) 600-800 MG-UNIT TABS Take by mouth daily. 2 capsule daily    . COLLAGEN PO Take by mouth.    . conjugated estrogens (PREMARIN) vaginal cream Use a pea-sized amount externally and internally twice a week at night 42.5 g 1  . Cyanocobalamin (B-12) 2500 MCG TABS Take 1 tablet by mouth daily. Pt taking 1/2 of a 5000 mcg tablet    . donepezil (ARICEPT) 10 MG tablet Take 10 mg by mouth at bedtime.     . famotidine (PEPCID) 20 MG tablet Take 1 tablet (20 mg total) by mouth 2 (two) times daily as needed for heartburn or indigestion. 180 tablet 3  . FIBER PO Take by mouth.    . fluticasone (FLONASE) 50 MCG/ACT nasal spray SHAKE LIQUID AND USE 2 SPRAYS IN EACH NOSTRIL DAILY 16 g 11  . meclizine (ANTIVERT) 25 MG tablet Take 25 mg by mouth 3 (three) times daily as needed for dizziness.     . memantine (NAMENDA) 10 MG tablet Take 10 mg by mouth 2 (two) times daily.    . Multiple Vitamin (MULTIVITAMIN) tablet Take 1 tablet by mouth daily.    . Multiple Vitamins-Minerals (HAIR/SKIN/NAILS/BIOTIN PO) Take 1 tablet by mouth daily.    Marland Kitchen omeprazole (PRILOSEC) 40 MG capsule Take 1 capsule (40 mg total) by mouth daily. For 3 weeks then as needed 30 capsule 1  . PEDIASURE/FIBER (PEDIASURE/FIBER) LIQD Take 10 mLs by mouth.    . polyethylene glycol (MIRALAX / GLYCOLAX) packet Take 17 g by mouth daily.    . rosuvastatin (CRESTOR) 10 MG tablet TAKE 1 TABLET(10 MG) BY MOUTH AT BEDTIME 90 tablet 0   No facility-administered medications prior to visit.     Per HPI unless specifically indicated in ROS section below Review of Systems Objective:  BP 134/68   Pulse 74   Temp 97.8 F (36.6 C) (Temporal)   Ht 5' 2.5" (1.588 m)   Wt 143 lb 9 oz (65.1 kg)   SpO2 96%   BMI 25.84 kg/m   Wt Readings from Last 3 Encounters:  12/13/20 143 lb 9 oz (65.1 kg)  08/23/20 137 lb 12.8 oz (62.5 kg)  05/12/20 133 lb 2 oz (60.4 kg)  Physical Exam Vitals and nursing note reviewed.  Constitutional:      Appearance: Normal appearance. She is not ill-appearing.  Skin:    General: Skin is warm and dry.     Capillary Refill: Capillary refill takes less than 2 seconds.     Comments: Multiple brown waxy stuck on growths predominantly around breasts, some on abdomen, back, neck  Neurological:     Mental Status: She is alert.        Assessment & Plan:  This visit occurred during the SARS-CoV-2 public health emergency.  Safety protocols were in place, including screening questions prior to the visit, additional usage of staff PPE, and extensive cleaning of exam room while observing appropriate contact time as indicated for disinfecting solutions.   Problem List Items Addressed This Visit    Seborrheic keratosis    Discussed etiology ,reassured on benign nature of condition. Continue to monitor, let  us know if growing or changing color.           No orders of the defined types were placed in this encounter.  No orders of the defined types were placed in this encounter.   Follow up plan: Return if symptoms worsen or fail to improve.  Kathy Boyden, MD

## 2020-12-13 NOTE — Patient Instructions (Signed)
Seborrheic Keratosis A seborrheic keratosis is a common, noncancerous (benign) skin growth. These growths are velvety, waxy, rough, tan, brown, or black spots that appear on the skin. These skin growths can be flat or raised, and scaly. What are the causes? The cause of this condition is not known. What increases the risk? You are more likely to develop this condition if you:  Have a family history of seborrheic keratosis.  Are 50 or older.  Are pregnant.  Have had estrogen replacement therapy. What are the signs or symptoms? Symptoms of this condition include growths on the face, chest, shoulders, back, or other areas. These growths:  Are usually painless, but may become irritated and itchy.  Can be yellow, brown, black, or other colors.  Are slightly raised or have a flat surface.  Are sometimes rough or wart-like in texture.  Are often velvety or waxy on the surface.  Are round or oval-shaped.  Often occur in groups, but may occur as a single growth.   How is this diagnosed? This condition is diagnosed with a medical history and physical exam.  A sample of the growth may be tested (skin biopsy).  You may need to see a skin specialist (dermatologist). How is this treated? Treatment is not usually needed for this condition, unless the growths are irritated or bleed often.  You may also choose to have the growths removed if you do not like their appearance. ? Most commonly, these growths are treated with a procedure in which liquid nitrogen is applied to "freeze" off the growth (cryosurgery). ? They may also be burned off with electricity (electrocautery) or removed by scraping (curettage). Follow these instructions at home:  Watch your growth for any changes.  Keep all follow-up visits as told by your health care provider. This is important.  Do not scratch or pick at the growth or growths. This can cause them to become irritated or infected. Contact a health care  provider if:  You suddenly have many new growths.  Your growth bleeds, itches, or hurts.  Your growth suddenly becomes larger or changes color. Summary  A seborrheic keratosis is a common, noncancerous (benign) skin growth.  Treatment is not usually needed for this condition, unless the growths are irritated or bleed often.  Watch your growth for any changes.  Contact a health care provider if you suddenly have many new growths or your growth suddenly becomes larger or changes color.  Keep all follow-up visits as told by your health care provider. This is important. This information is not intended to replace advice given to you by your health care provider. Make sure you discuss any questions you have with your health care provider. Document Revised: 01/08/2018 Document Reviewed: 01/08/2018 Elsevier Patient Education  2021 Elsevier Inc.  

## 2020-12-13 NOTE — Assessment & Plan Note (Signed)
Discussed etiology ,reassured on benign nature of condition. Continue to monitor, let us know if growing or changing color.

## 2020-12-14 ENCOUNTER — Ambulatory Visit (INDEPENDENT_AMBULATORY_CARE_PROVIDER_SITE_OTHER): Payer: PPO | Admitting: Family Medicine

## 2020-12-14 ENCOUNTER — Encounter: Payer: Self-pay | Admitting: Family Medicine

## 2020-12-14 VITALS — BP 160/68 | HR 55 | Temp 98.0°F | Ht 62.5 in | Wt 145.0 lb

## 2020-12-14 DIAGNOSIS — M7062 Trochanteric bursitis, left hip: Secondary | ICD-10-CM | POA: Diagnosis not present

## 2020-12-14 MED ORDER — TRIAMCINOLONE ACETONIDE 40 MG/ML IJ SUSP
40.0000 mg | Freq: Once | INTRAMUSCULAR | Status: AC
  Administered 2020-12-14: 40 mg via INTRAMUSCULAR

## 2020-12-14 NOTE — Patient Instructions (Signed)
Hip Rehab:  Hip Flexion: Toe up to ceiling, laying on your back. Lift your whole leg, 3 sets. Work up to being able to do #30 with each set.  Hip elevations, Toe and leg turned out to side.  Lift whole leg, 3 sets. Work up to being able to do #30 with each set.  Hip Abductions: Lying on side, straight out to side. 3 sets, work up to being able to do #30 with each set.  At the beginning you may only be able to do a lot less, try to do #10.  

## 2020-12-14 NOTE — Progress Notes (Signed)
    Kathy Tess T. Siler Mavis, MD, CAQ Sports Medicine  Primary Care and Sports Medicine [facility] at [facility] [address] [place], [address]  Phone: [phone]  FAX: [phone]  Kathy Baldwin - 75 y.o. female  MRN [id number]  Date of Birth: 12-27-1945  Date: 12/14/2020  PCP: [name], MD  Referral: [name], MD  Chief Complaint  Patient presents with  . Hip Pain    Left-Injection    This visit occurred during the SARS-CoV-2 public health emergency.  Safety protocols were in place, including screening questions prior to the visit, additional usage of staff PPE, and extensive cleaning of exam room while observing appropriate contact time as indicated for disinfecting solutions.   Subjective:   Kathy Baldwin is a 75 y.o. very pleasant female patient with Body mass index is 26.1 kg/m. who presents with the following:  Posterolateral - saw her 5 months ago.  I also gave her some basic hip str  Aspiration/Injection Procedure Note [name] [birth year] Date of procedure: 12/14/2020  Procedure: Large Joint Aspiration / Injection of Hip, Trochanteric Bursa, L Indications: Pain  Procedure Details Verbal consent obtained. Risks, benefits, and alternatives reviewed. Greater trochanter sterilely prepped with Chloraprep. Ethyl Chloride used for anesthesia. 9 cc of Lidocaine 1% injected with 1 mL of Kenalog 40 mg into trochanteric bursa at area of maximal tenderness at greater trochanter. Needle taken to bone to troch bursa, flows easily. Bursa massaged. No bleeding and no complications. Decreased pain after injection. Needle: 22 gauge spinal needle Medication: 1 mL of Kenalog 40 mg    ICD-10-CM   1. Trochanteric bursitis of left hip  M70.62 triamcinolone acetonide (KENALOG-40) injection 40 mg    Meds ordered this encounter  Medications  . triamcinolone acetonide (KENALOG-40) injection 40 mg     Signed,  Lochlan Grygiel T. Trent Theisen, MD      Patient Instructions  Hip Rehab:  Hip Flexion: Toe up to ceiling, laying on your back. Lift your whole leg, 3 sets. Work up to being able to do #30 with each set.  Hip elevations, Toe and leg turned out to side.  Lift whole leg, 3 sets. Work up to being able to do #30 with each set.  Hip Abductions: Lying on side, straight out to side. 3 sets, work up to being able to do #30 with each set.  At the beginning you may only be able to do a lot less, try to do #10.

## 2020-12-21 ENCOUNTER — Telehealth: Payer: Self-pay

## 2020-12-21 NOTE — Chronic Care Management (AMB) (Addendum)
Chronic Care Management Pharmacy Assistant   Name: Kathy Baldwin  MRN: [id number] DOB: Oct 28, 1945   Reason for Encounter: Medication Review - Medication Adherence and Delivery Coordination   Recent office visits:  12/14/2020 Dr.Copland     12/13/2020 Dr.Gutierrez  PCP   Recent consult visits:  09/26/2020 Hildred Priest, Neurology 09/11/2020 Dr. Dorothyann Peng, Cardiology   Hospital visits:  None in previous 6 months  Medications: Outpatient Encounter Medications as of 12/21/2020  Medication Sig   aspirin EC 325 MG tablet Take 1 tablet (325 mg total) by mouth daily.   Calcium Carb-Cholecalciferol (CALCIUM 600+D) 600-800 MG-UNIT TABS Take by mouth daily. 2 capsule daily   COLLAGEN PO Take by mouth.   conjugated estrogens (PREMARIN) vaginal cream Use a pea-sized amount externally and internally twice a week at night   Cyanocobalamin (B-12) 2500 MCG TABS Take 1 tablet by mouth daily. Pt taking 1/2 of a 5000 mcg tablet   donepezil (ARICEPT) 10 MG tablet Take 10 mg by mouth at bedtime.    famotidine (PEPCID) 20 MG tablet Take 1 tablet (20 mg total) by mouth 2 (two) times daily as needed for heartburn or indigestion.   FIBER PO Take by mouth.   fluticasone (FLONASE) 50 MCG/ACT nasal spray SHAKE LIQUID AND USE 2 SPRAYS IN EACH NOSTRIL DAILY   meclizine (ANTIVERT) 25 MG tablet Take 25 mg by mouth 3 (three) times daily as needed for dizziness.   memantine (NAMENDA) 10 MG tablet Take 10 mg by mouth 2 (two) times daily.   Multiple Vitamin (MULTIVITAMIN) tablet Take 1 tablet by mouth daily.   Multiple Vitamins-Minerals (HAIR/SKIN/NAILS/BIOTIN PO) Take 1 tablet by mouth daily.   omeprazole (PRILOSEC) 40 MG capsule Take 1 capsule (40 mg total) by mouth daily. For 3 weeks then as needed   PEDIASURE/FIBER (PEDIASURE/FIBER) LIQD Take 10 mLs by mouth.   polyethylene glycol (MIRALAX / GLYCOLAX) packet Take 17 g by mouth daily.   rosuvastatin (CRESTOR) 10 MG tablet TAKE 1 TABLET(10 MG) BY  MOUTH AT BEDTIME   No facility-administered encounter medications on file as of 12/21/2020.   BP Readings from Last 3 Encounters:  12/14/20 (!) 160/68  12/13/20 134/68  08/23/20 (!) 150/76    No results found for: HGBA1C   Recent OV, Consult or Hospital visit:  No medication changes indicated  Patient obtains medications through Adherence Packaging  90 Days   Last adherence delivery date: 09/29/2020   Packs, 90 DS  Amlodipine 2.5 mg one daily - 1 breakfast   Donepezil 10 mg one daily - 1 bedtime  Famotidine 20 mg one daily - 1 breakfast, 1 evening meal  Memantine 10 mg - 1 breakfast, 1 evening meal    Rosuvastatin 10 mg one daily - 1 bedtime  Flonase- 2 sprays each nostril  due to PRN use  Patient is due for next adherence delivery on: 12/29/2020  Spoke with patient on 12/22/2020 and reviewed medications and coordinated delivery.  This delivery to include: Adherence Packaging  90 Days  Packs, 90 DS  Amlodipine 2.5 mg one daily - 1 breakfast   Donepezil 10 mg one daily - 1 bedtime  Famotidine 20 mg one daily - 1 breakfast, 1 evening meal  Memantine 10 mg - 1 breakfast, 1 evening meal    Rosuvastatin 10 mg one daily - 1 bedtime  PRN/VIAL medications:  Flonase- 2 sprays each nostril  due to PRN use   Patient declined the following medications this month:  Premarin vaginal cream -  twice weekly at night (patient has plenty on hand)  Omeprazole 40mg .  1 tablet once daily (has adequate supply)  Patient needs refills on the following medications:   Rosuvastatin 10 mg one daily - 1 bedtime  Flonase-50 mcg/act  2 sprays each nostril  prn  Confirmed delivery date of 12/29/2020, advised patient that pharmacy will contact her the morning of delivery. No BG or BP readings to report.  Follow-Up:  Coordination of Enhanced Pharmacy Services and Pharmacist Review  12/31/2020, CPP notified  Phil Dopp, [facility] Clinical Pharmacy Assistant [phone]  I have reviewed the care  management and care coordination activities outlined in this encounter and I am certifying that I agree with the content of this note. No further action required.  [phone], PharmD Clinical Pharmacist [facility] [phone]

## 2020-12-25 ENCOUNTER — Telehealth: Payer: Self-pay

## 2020-12-25 MED ORDER — ROSUVASTATIN CALCIUM 10 MG PO TABS: ORAL_TABLET | ORAL | 0 refills | Status: DC

## 2020-12-25 MED ORDER — FLUTICASONE PROPIONATE 50 MCG/ACT NA SUSP: NASAL | 2 refills | Status: DC

## 2020-12-25 NOTE — Telephone Encounter (Signed)
E-scribed refills.  Plz schedule wellness, labs and cpe. 

## 2020-12-25 NOTE — Telephone Encounter (Signed)
-----   Message from [place], [place] sent at 12/22/2020 12:31 PM EDT ----- Regarding: pt, needs refills Contact: [phone]  Pt. Is requesting refills on Rosuvastatin 10 mg. 1 a day  90ds.                                                  And Fluticasone ( very old RX. )     Thanks  Burt Knack CMA

## 2020-12-26 DIAGNOSIS — G4733 Obstructive sleep apnea (adult) (pediatric): Secondary | ICD-10-CM | POA: Diagnosis not present

## 2020-12-26 NOTE — Telephone Encounter (Signed)
Noted  

## 2020-12-26 NOTE — Telephone Encounter (Signed)
Patient called back to schedule appts - says he was returning the schedulers call

## 2020-12-26 NOTE — Telephone Encounter (Signed)
Patient is scheduled EM 

## 2021-02-24 ENCOUNTER — Other Ambulatory Visit: Payer: Self-pay | Admitting: Family Medicine

## 2021-02-24 DIAGNOSIS — E782 Mixed hyperlipidemia: Secondary | ICD-10-CM

## 2021-02-24 DIAGNOSIS — E041 Nontoxic single thyroid nodule: Secondary | ICD-10-CM

## 2021-02-24 DIAGNOSIS — M8589 Other specified disorders of bone density and structure, multiple sites: Secondary | ICD-10-CM

## 2021-02-24 DIAGNOSIS — G3184 Mild cognitive impairment, so stated: Secondary | ICD-10-CM

## 2021-02-24 DIAGNOSIS — I10 Essential (primary) hypertension: Secondary | ICD-10-CM

## 2021-02-26 ENCOUNTER — Other Ambulatory Visit: Payer: Self-pay

## 2021-02-26 ENCOUNTER — Other Ambulatory Visit (INDEPENDENT_AMBULATORY_CARE_PROVIDER_SITE_OTHER): Payer: PPO

## 2021-02-26 DIAGNOSIS — M8589 Other specified disorders of bone density and structure, multiple sites: Secondary | ICD-10-CM

## 2021-02-26 DIAGNOSIS — I10 Essential (primary) hypertension: Secondary | ICD-10-CM

## 2021-02-26 DIAGNOSIS — E782 Mixed hyperlipidemia: Secondary | ICD-10-CM | POA: Diagnosis not present

## 2021-02-26 DIAGNOSIS — E041 Nontoxic single thyroid nodule: Secondary | ICD-10-CM

## 2021-02-26 LAB — MICROALBUMIN / CREATININE URINE RATIO
Creatinine,U: 38.2 mg/dL
Microalb Creat Ratio: 1.8 mg/g (ref 0.0–30.0)
Microalb, Ur: <0.7 mg/dL (ref 0.0–1.9)

## 2021-02-26 LAB — COMPREHENSIVE METABOLIC PANEL
ALT: 21 U/L (ref 0–35)
AST: 32 U/L (ref 0–37)
Albumin: 4.5 g/dL (ref 3.5–5.2)
Alkaline Phosphatase: 69 U/L (ref 39–117)
BUN: 17 mg/dL (ref 6–23)
CO2: 30 mEq/L (ref 19–32)
Calcium: 9.9 mg/dL (ref 8.4–10.5)
Chloride: 105 mEq/L (ref 96–112)
Creatinine, Ser: 0.81 mg/dL (ref 0.40–1.20)
GFR: 70.46 mL/min (ref >60.00)
Glucose, Bld: 87 mg/dL (ref 70–99)
Potassium: 4 mEq/L (ref 3.5–5.1)
Sodium: 142 mEq/L (ref 135–145)
Total Bilirubin: 0.9 mg/dL (ref 0.2–1.2)
Total Protein: 6.9 g/dL (ref 6.0–8.3)

## 2021-02-26 LAB — LIPID PANEL
Cholesterol: 182 mg/dL (ref 0–200)
HDL: 95.5 mg/dL (ref >39.00)
LDL Cholesterol: 76 mg/dL (ref 0–99)
NonHDL: 86.71
Total CHOL/HDL Ratio: 2
Triglycerides: 54 mg/dL (ref 0.0–149.0)
VLDL: 10.8 mg/dL (ref 0.0–40.0)

## 2021-02-26 LAB — TSH: TSH: 0.91 u[IU]/mL (ref 0.35–4.50)

## 2021-02-26 LAB — VITAMIN D 25 HYDROXY (VIT D DEFICIENCY, FRACTURES): VITD: 61.25 ng/mL (ref 30.00–100.00)

## 2021-02-28 ENCOUNTER — Ambulatory Visit: Payer: PPO

## 2021-02-28 ENCOUNTER — Telehealth: Payer: Self-pay

## 2021-02-28 ENCOUNTER — Other Ambulatory Visit: Payer: Self-pay

## 2021-02-28 NOTE — Telephone Encounter (Signed)
Called patient 3 times trying to complete AWV. Patient never answered. Left message on voicemail notifying patient I called and to call and reschedule at her convenience. Appointment cancelled.

## 2021-03-05 ENCOUNTER — Ambulatory Visit (INDEPENDENT_AMBULATORY_CARE_PROVIDER_SITE_OTHER): Payer: PPO | Admitting: Family Medicine

## 2021-03-05 ENCOUNTER — Other Ambulatory Visit: Payer: Self-pay

## 2021-03-05 ENCOUNTER — Encounter: Payer: Self-pay | Admitting: Family Medicine

## 2021-03-05 ENCOUNTER — Other Ambulatory Visit: Payer: Self-pay | Admitting: Family Medicine

## 2021-03-05 ENCOUNTER — Telehealth: Payer: Self-pay

## 2021-03-05 VITALS — BP 130/70 | HR 68 | Temp 97.2°F | Ht 62.0 in | Wt 142.0 lb

## 2021-03-05 DIAGNOSIS — G3184 Mild cognitive impairment, so stated: Secondary | ICD-10-CM

## 2021-03-05 DIAGNOSIS — R350 Frequency of micturition: Secondary | ICD-10-CM | POA: Diagnosis not present

## 2021-03-05 DIAGNOSIS — Z7189 Other specified counseling: Secondary | ICD-10-CM

## 2021-03-05 DIAGNOSIS — Z0000 Encounter for general adult medical examination without abnormal findings: Secondary | ICD-10-CM | POA: Diagnosis not present

## 2021-03-05 DIAGNOSIS — I10 Essential (primary) hypertension: Secondary | ICD-10-CM

## 2021-03-05 DIAGNOSIS — K219 Gastro-esophageal reflux disease without esophagitis: Secondary | ICD-10-CM

## 2021-03-05 DIAGNOSIS — E782 Mixed hyperlipidemia: Secondary | ICD-10-CM

## 2021-03-05 DIAGNOSIS — N3941 Urge incontinence: Secondary | ICD-10-CM

## 2021-03-05 DIAGNOSIS — M8589 Other specified disorders of bone density and structure, multiple sites: Secondary | ICD-10-CM

## 2021-03-05 DIAGNOSIS — I700 Atherosclerosis of aorta: Secondary | ICD-10-CM

## 2021-03-05 DIAGNOSIS — Z8673 Personal history of transient ischemic attack (TIA), and cerebral infarction without residual deficits: Secondary | ICD-10-CM

## 2021-03-05 DIAGNOSIS — I6523 Occlusion and stenosis of bilateral carotid arteries: Secondary | ICD-10-CM

## 2021-03-05 DIAGNOSIS — Z1231 Encounter for screening mammogram for malignant neoplasm of breast: Secondary | ICD-10-CM

## 2021-03-05 LAB — POCT URINALYSIS DIP (MANUAL ENTRY)
Bilirubin, UA: NEGATIVE
Blood, UA: NEGATIVE
Glucose, UA: NEGATIVE mg/dL
Ketones, POC UA: NEGATIVE mg/dL
Nitrite, UA: NEGATIVE
Protein Ur, POC: NEGATIVE mg/dL
Spec Grav, UA: 1.01 (ref 1.010–1.025)
Urobilinogen, UA: 0.2 E.U./dL
pH, UA: 5.5 (ref 5.0–8.0)

## 2021-03-05 MED ORDER — ROSUVASTATIN CALCIUM 10 MG PO TABS: ORAL_TABLET | ORAL | 3 refills | Status: DC

## 2021-03-05 MED ORDER — OMEPRAZOLE 40 MG PO CPDR: 40.0000 mg | DELAYED_RELEASE_CAPSULE | Freq: Every day | ORAL | 3 refills | Status: DC

## 2021-03-05 MED ORDER — FAMOTIDINE 20 MG PO TABS: 20.0000 mg | ORAL_TABLET | Freq: Two times a day (BID) | ORAL | 3 refills | Status: DC | PRN

## 2021-03-05 MED ORDER — MIRABEGRON ER 25 MG PO TB24: 25.0000 mg | ORAL_TABLET | Freq: Every day | ORAL | 1 refills | Status: DC

## 2021-03-05 NOTE — Assessment & Plan Note (Signed)
Preventative protocols reviewed and updated unless pt declined. Discussed healthy diet and lifestyle.  

## 2021-03-05 NOTE — Patient Instructions (Addendum)
We will sign you up for cologuard again this year.  Send [place] dates of second COVID booster to update your chart Bring [place] a copy of your advanced directives to update your chart.  Urinalysis today. Try myrbetriq for bladder symptoms.  Return in 6 months for follow up visit.   Health Maintenance After Age 77 After age 63, you are at a higher risk for certain long-term diseases and infections as well as injuries from falls. Falls are a major cause of broken bones and head injuries in people who are older than age 64. Getting regular preventive care can help to keep you healthy and well. Preventive care includes getting regular testing and making lifestyle changes as recommended by your health care provider. Talk with your health care provider about: Which screenings and tests you should have. A screening is a test that checks for a disease when you have no symptoms. A diet and exercise plan that is right for you. What should I know about screenings and tests to prevent falls? Screening and testing are the best ways to find a health problem early. Early diagnosis and treatment give you the best chance of managing medical conditions that are common after age 75. Certain conditions and lifestyle choices may make you more likely to have a fall. Your health care provider may recommend: Regular vision checks. Poor vision and conditions such as cataracts can make you more likely to have a fall. If you wear glasses, make sure to get your prescription updated if your vision changes. Medicine review. Work with your health care provider to regularly review all of the medicines you are taking, including over-the-counter medicines. Ask your health care provider about any side effects that may make you more likely to have a fall. Tell your health care provider if any medicines that you take make you feel dizzy or sleepy. Osteoporosis screening. Osteoporosis is a condition that causes the bones to get weaker. This can  make the bones weak and cause them to break more easily. Blood pressure screening. Blood pressure changes and medicines to control blood pressure can make you feel dizzy. Strength and balance checks. Your health care provider may recommend certain tests to check your strength and balance while standing, walking, or changing positions. Foot health exam. Foot pain and numbness, as well as not wearing proper footwear, can make you more likely to have a fall. Depression screening. You may be more likely to have a fall if you have a fear of falling, feel emotionally low, or feel unable to do activities that you used to do. Alcohol use screening. Using too much alcohol can affect your balance and may make you more likely to have a fall. What actions can I take to lower my risk of falls? General instructions Talk with your health care provider about your risks for falling. Tell your health care provider if: You fall. Be sure to tell your health care provider about all falls, even ones that seem minor. You feel dizzy, sleepy, or off-balance. Take over-the-counter and prescription medicines only as told by your health care provider. These include any supplements. Eat a healthy diet and maintain a healthy weight. A healthy diet includes low-fat dairy products, low-fat (lean) meats, and fiber from whole grains, beans, and lots of fruits and vegetables. Home safety Remove any tripping hazards, such as rugs, cords, and clutter. Install safety equipment such as grab bars in bathrooms and safety rails on stairs. Keep rooms and walkways well-lit. Activity  Follow  a regular exercise program to stay fit. This will help you maintain your balance. Ask your health care provider what types of exercise are appropriate for you. If you need a cane or walker, use it as recommended by your health care provider. Wear supportive shoes that have nonskid soles.  Lifestyle Do not drink alcohol if your health care provider  tells you not to drink. If you drink alcohol, limit how much you have: 0-1 drink a day for women. 0-2 drinks a day for men. Be aware of how much alcohol is in your drink. In the [place] one drink equals one typical bottle of beer (12 oz), one-half glass of wine (5 oz), or one shot of hard liquor (1 oz). Do not use any products that contain nicotine or tobacco, such as cigarettes and e-cigarettes. If you need help quitting, ask your health care provider. Summary Having a healthy lifestyle and getting preventive care can help to protect your health and wellness after age 36. Screening and testing are the best way to find a health problem early and help you avoid having a fall. Early diagnosis and treatment give you the best chance for managing medical conditions that are more common for people who are older than age 67. Falls are a major cause of broken bones and head injuries in people who are older than age 49. Take precautions to prevent a fall at home. Work with your health care provider to learn what changes you can make to improve your health and wellness and to prevent falls. This information is not intended to replace advice given to you by your health care provider. Make sure you discuss any questions you have with your healthcare provider. Document Revised: 08/11/2020 Document Reviewed: 08/11/2020 Elsevier Patient Education  2022 [employer].

## 2021-03-05 NOTE — Assessment & Plan Note (Signed)

## 2021-03-05 NOTE — Telephone Encounter (Signed)
Faxed Cologuard order to [employer].

## 2021-03-05 NOTE — Assessment & Plan Note (Signed)
Advanced directive discussion - has at home. Husband and daughter are HCPOA. Will bring me copy.

## 2021-03-05 NOTE — Progress Notes (Addendum)
Patient ID: Kathy Baldwin, female    DOB: 09-16-1945, 75 y.o.   MRN: [id number]  This visit was conducted in person.  BP 130/70   Pulse 68   Temp (!) 97.2 F (36.2 C) (Temporal)   Ht 5\' 2"  (1.575 m)   Wt 142 lb (64.4 kg)   SpO2 97%   BMI 25.97 kg/m    CC: CPE/AMW Subjective:   HPI: Kathy Baldwin is a 75 y.o. female presenting on 03/05/2021 for Annual Exam   Did not see health advisor this year.   No results found.  Flowsheet Row Office Visit from 03/05/2021 in [facility] [place]  PHQ-2 Total Score 0       Fall Risk  03/05/2021 04/20/2019 10/08/2018 04/17/2018 03/09/2018  Falls in the past year? 0 0 1 No Yes  Comment - - - removing dead leaves from a fig tree and fell to the ground -  Number falls in past yr: 0 0 0 1 1  Injury with Fall? 0 0 0 No No  Risk for fall due to : - - - Impaired vision;Other (Comment);Impaired balance/gait;History of fall(s) -  Risk for fall due to: Comment - - - wears eyeglasses; vertigo -  Follow up - Falls prevention discussed - Falls evaluation completed;Education provided;Falls prevention discussed -    Saw Dr 05/10/2018 earlier this year s/p L hip trochanteric bursa injection   Preventative: Colon cancer screening - cologuard 03/2018. Requests repeat this year.   Breast cancer screening - mammo 04/2019 Birad1 @ 05/2019 - upcoming scheduled this week  Well woman exam - last pap normal 2017 - desires to age out. No fmhx GYN cancer. DEXA scan - osteopenia managed by endo ([facility]) on reclast yearly infusion - skipped this past year.  Lung cancer screening - not eligible  Flu shot -yearly COVID vaccine Moderna 10/2019, 11/2019, booster 06/2020, already got second booster. Td 2003, Tdap 2013 Prevnar13 2016, pneumovax 23 2012, 2015  Zostavax - 2013 Shingrix - 2019 x2 Advanced directive discussion - has at home. Husband and daughter are HCPOA. Will bring me copy.  Seat belt use discussed Sunscreen use discussed. No changing  moles on skin. Doesn't see derm Smoking - none Alcohol  - none Dentist - q6 mo Eye exam - yearly Bowel - no significant constipation  Bladder - some urge incontinence, wears pad regularly. No significant stress incontinence symptoms     Relevant past medical, surgical, family and social history reviewed and updated as indicated. Interim medical history since our last visit reviewed. Allergies and medications reviewed and updated. Outpatient Medications Prior to Visit  Medication Sig Dispense Refill   aspirin EC 325 MG tablet Take 1 tablet (325 mg total) by mouth daily.     Calcium Carb-Cholecalciferol (CALCIUM 600+D) 600-800 MG-UNIT TABS Take by mouth daily. 2 capsule daily     COLLAGEN PO Take by mouth.     conjugated estrogens (PREMARIN) vaginal cream Use a pea-sized amount externally and internally twice a week at night 42.5 g 1   Cyanocobalamin (B-12) 2500 MCG TABS Take 1 tablet by mouth daily. Pt taking 1/2 of a 5000 mcg tablet     donepezil (ARICEPT) 10 MG tablet Take 10 mg by mouth at bedtime.      FIBER PO Take by mouth.     fluticasone (FLONASE) 50 MCG/ACT nasal spray SHAKE LIQUID AND USE 2 SPRAYS IN EACH NOSTRIL DAILY 16 g 2   meclizine (ANTIVERT) 25 MG tablet  Take 25 mg by mouth 3 (three) times daily as needed for dizziness.     memantine (NAMENDA) 10 MG tablet Take 10 mg by mouth 2 (two) times daily.     Multiple Vitamin (MULTIVITAMIN) tablet Take 1 tablet by mouth daily.     Multiple Vitamins-Minerals (HAIR/SKIN/NAILS/BIOTIN PO) Take 1 tablet by mouth daily.     PEDIASURE/FIBER (PEDIASURE/FIBER) LIQD Take 10 mLs by mouth.     polyethylene glycol (MIRALAX / GLYCOLAX) packet Take 17 g by mouth daily.     famotidine (PEPCID) 20 MG tablet Take 1 tablet (20 mg total) by mouth 2 (two) times daily as needed for heartburn or indigestion. 180 tablet 3   omeprazole (PRILOSEC) 40 MG capsule Take 1 capsule (40 mg total) by mouth daily. For 3 weeks then as needed 30 capsule 1    rosuvastatin (CRESTOR) 10 MG tablet TAKE 1 TABLET(10 MG) BY MOUTH AT BEDTIME 90 tablet 0   No facility-administered medications prior to visit.     Per HPI unless specifically indicated in ROS section below Review of Systems  Constitutional:  Negative for activity change, appetite change, chills, fatigue, fever and unexpected weight change.  HENT:  Negative for hearing loss.   Eyes:  Negative for visual disturbance.  Respiratory:  Negative for cough, chest tightness, shortness of breath and wheezing.   Cardiovascular:  Negative for chest pain, palpitations and leg swelling.  Gastrointestinal:  Negative for abdominal distention, abdominal pain, blood in stool, constipation, diarrhea, nausea and vomiting.  Genitourinary:  Negative for difficulty urinating and hematuria.  Musculoskeletal:  Negative for arthralgias, myalgias and neck pain.  Skin:  Negative for rash.  Neurological:  Negative for dizziness, seizures, syncope and headaches.  Hematological:  Negative for adenopathy. Does not bruise/bleed easily.  Psychiatric/Behavioral:  Negative for dysphoric mood. The patient is not nervous/anxious.    Objective:  BP 130/70   Pulse 68   Temp (!) 97.2 F (36.2 C) (Temporal)   Ht 5\' 2"  (1.575 m)   Wt 142 lb (64.4 kg)   SpO2 97%   BMI 25.97 kg/m   Wt Readings from Last 3 Encounters:  03/05/21 142 lb (64.4 kg)  12/14/20 145 lb (65.8 kg)  12/13/20 143 lb 9 oz (65.1 kg)      Physical Exam Vitals and nursing note reviewed.  Constitutional:      Appearance: Normal appearance. She is not ill-appearing.  HENT:     Head: Normocephalic and atraumatic.     Right Ear: Tympanic membrane, ear canal and external ear normal. There is no impacted cerumen.     Left Ear: Tympanic membrane, ear canal and external ear normal. There is no impacted cerumen.  Eyes:     General:        Right eye: No discharge.        Left eye: No discharge.     Extraocular Movements: Extraocular movements intact.      Conjunctiva/sclera: Conjunctivae normal.     Pupils: Pupils are equal, round, and reactive to light.  Neck:     Thyroid: No thyroid mass or thyromegaly.     Vascular: No carotid bruit.  Cardiovascular:     Rate and Rhythm: Normal rate and regular rhythm.     Pulses: Normal pulses.     Heart sounds: Normal heart sounds. No murmur heard. Pulmonary:     Effort: Pulmonary effort is normal. No respiratory distress.     Breath sounds: Normal breath sounds. No wheezing, rhonchi or rales.  Abdominal:     General: Bowel sounds are normal. There is no distension.     Palpations: Abdomen is soft. There is no mass.     Tenderness: There is no abdominal tenderness. There is no guarding or rebound.     Hernia: No hernia is present.  Musculoskeletal:     Cervical back: Normal range of motion and neck supple. No rigidity.     Right lower leg: No edema.     Left lower leg: No edema.  Lymphadenopathy:     Cervical: No cervical adenopathy.  Skin:    General: Skin is warm and dry.     Findings: No rash.  Neurological:     General: No focal deficit present.     Mental Status: She is alert. Mental status is at baseline.     Comments:  Recall 3/3  Calculation 5/5 DLROW   Psychiatric:        Mood and Affect: Mood normal.        Behavior: Behavior normal.      Results for orders placed or performed in visit on 03/05/21  POCT urinalysis dipstick  Result Value Ref Range   Color, UA yellow yellow   Clarity, UA clear clear   Glucose, UA negative negative mg/dL   Bilirubin, UA negative negative   Ketones, POC UA negative negative mg/dL   Spec Grav, UA 5.701 7.793 - 1.025   Blood, UA negative negative   pH, UA 5.5 5.0 - 8.0   Protein Ur, POC negative negative mg/dL   Urobilinogen, UA 0.2 0.2 or 1.0 E.U./dL   Nitrite, UA Negative Negative   Leukocytes, UA Small (1+) (A) Negative    Assessment & Plan:  This visit occurred during the SARS-CoV-2 public health emergency.  Safety protocols were in  place, including screening questions prior to the visit, additional usage of staff PPE, and extensive cleaning of exam room while observing appropriate contact time as indicated for disinfecting solutions.   Problem List Items Addressed This Visit     Hyperlipidemia    Chronic, stable. Continue crestor 10mg  daily  The ASCVD Risk score DC Jr., et al., 2013) failed to calculate for the following reasons:   The patient has a prior MI or stroke diagnosis        Relevant Medications   rosuvastatin (CRESTOR) 10 MG tablet   Hypertension    Chronic, stable off medication - was previously on amlodipine.        Relevant Medications   rosuvastatin (CRESTOR) 10 MG tablet   Osteopenia    This is followed by 2014 endocrinology  Has received reclast yearly infusions - but skipped this year.        GERD (gastroesophageal reflux disease)    Continues PPI daily  along with pepcid BID        Relevant Medications   famotidine (PEPCID) 20 MG tablet   omeprazole (PRILOSEC) 40 MG capsule   Preventative health care    Preventative protocols reviewed and updated unless pt declined. Discussed healthy diet and lifestyle.        Aortic atherosclerosis (HCC)    Continues full dose aspirin, statin.        Relevant Medications   rosuvastatin (CRESTOR) 10 MG tablet   History of cerebellar stroke    Continues full dose aspirin along with statin.  ?microembolic        Carotid stenosis, bilateral    Last carotid [facility] in our system done 10/2018 with mild  plaque - however it seems she may have had one done 05/2020 - will see if she has records of this.        Relevant Medications   rosuvastatin (CRESTOR) 10 MG tablet   MCI (mild cognitive impairment) with memory loss    Stable period on aricept + namenda, followed by neurology Malvin Johns([place]).        Medicare annual wellness visit, subsequent - Primary    I have personally reviewed the Medicare Annual Wellness questionnaire and have  noted 1. The patient's medical and social history 2. Their use of alcohol, tobacco or illicit drugs 3. Their current medications and supplements 4. The patient's functional ability including ADL's, fall risks, home safety risks and hearing or visual impairment. Cognitive function has been assessed and addressed as indicated.  5. Diet and physical activity 6. Evidence for depression or mood disorders The patients weight, height, BMI have been recorded in the chart. I have made referrals, counseling and provided education to the patient based on review of the above and I have provided the pt with a written personalized care plan for preventive services. Provider list updated.. See scanned questionairre as needed for further documentation. Reviewed preventative protocols and updated unless pt declined.        Advanced directives, counseling/discussion    Advanced directive discussion - has at home. Husband and daughter are HCPOA. Will bring me copy.        Urge urinary incontinence    Describes longstanding history of this - check UA, trial myrbetriq. Avoid antimuscarinics due to anticholinergic effects on cognition.        Relevant Medications   mirabegron ER (MYRBETRIQ) 25 MG TB24 tablet   Other Relevant Orders   POCT urinalysis dipstick (Completed)   Urine Culture     Meds ordered this encounter  Medications   mirabegron ER (MYRBETRIQ) 25 MG TB24 tablet    Sig: Take 1 tablet (25 mg total) by mouth daily.    Dispense:  30 tablet    Refill:  1   famotidine (PEPCID) 20 MG tablet    Sig: Take 1 tablet (20 mg total) by mouth 2 (two) times daily as needed for heartburn or indigestion.    Dispense:  180 tablet    Refill:  3   rosuvastatin (CRESTOR) 10 MG tablet    Sig: TAKE 1 TABLET(10 MG) BY MOUTH AT BEDTIME    Dispense:  90 tablet    Refill:  3   omeprazole (PRILOSEC) 40 MG capsule    Sig: Take 1 capsule (40 mg total) by mouth daily. For 3 weeks then as needed    Dispense:   90 capsule    Refill:  3    Orders Placed This Encounter  Procedures   Urine Culture   POCT urinalysis dipstick     Patient instructions: We will sign you up for cologuard again this year.  Send us dates of second COVID booster to update your chart Bring us a copy of your advanced directives to update your chart.  Urinalysis today. Try myrbetriq for bladder symptoms.  Return in 6 months for follow up visit.   Follow up plan: Return in about 6 months (around 09/04/2021) for follow up visit.  Eustaquio BoydenJavier Anmarie Fukushima, MD

## 2021-03-06 DIAGNOSIS — N3941 Urge incontinence: Secondary | ICD-10-CM | POA: Insufficient documentation

## 2021-03-06 NOTE — Assessment & Plan Note (Addendum)
Continues full dose aspirin, statin.

## 2021-03-06 NOTE — Assessment & Plan Note (Signed)
This is followed by [facility] endocrinology  Has received reclast yearly infusions - but skipped this year.

## 2021-03-06 NOTE — Assessment & Plan Note (Signed)
Continues full dose aspirin along with statin.  ?microembolic

## 2021-03-06 NOTE — Assessment & Plan Note (Signed)
Continues PPI daily  along with pepcid BID

## 2021-03-06 NOTE — Assessment & Plan Note (Signed)
Stable period on aricept + namenda, followed by neurology Malvin Johns).

## 2021-03-06 NOTE — Assessment & Plan Note (Addendum)
Last carotid US in our system done 10/2018 with mild plaque - however it seems she may have had one done 05/2020 - will see if she has records of this.

## 2021-03-06 NOTE — Assessment & Plan Note (Signed)
Chronic, stable. Continue crestor 10mg  daily  The ASCVD Risk score DC Jr., et al., 2013) failed to calculate for the following reasons:   The patient has a prior MI or stroke diagnosis

## 2021-03-06 NOTE — Addendum Note (Signed)
Addended by: [name] on: 03/06/2021 09:01 AM   Modules accepted: Orders

## 2021-03-06 NOTE — Assessment & Plan Note (Signed)
Describes longstanding history of this - check UA, trial myrbetriq. Avoid antimuscarinics due to anticholinergic effects on cognition.

## 2021-03-06 NOTE — Assessment & Plan Note (Addendum)
Chronic, stable off medication - was previously on amlodipine.

## 2021-03-07 LAB — URINE CULTURE
MICRO NUMBER:: 12053920
SPECIMEN QUALITY:: ADEQUATE

## 2021-03-08 ENCOUNTER — Other Ambulatory Visit: Payer: Self-pay | Admitting: Family Medicine

## 2021-03-08 MED ORDER — AMOXICILLIN 875 MG PO TABS: 875.0000 mg | ORAL_TABLET | Freq: Two times a day (BID) | ORAL | 0 refills | Status: DC

## 2021-03-09 ENCOUNTER — Other Ambulatory Visit: Payer: Self-pay

## 2021-03-09 ENCOUNTER — Ambulatory Visit
Admission: RE | Admit: 2021-03-09 | Discharge: 2021-03-09 | Disposition: A | Discharge status: Home / Self Care | Payer: PPO | Source: Ambulatory Visit | Attending: Family Medicine | Admitting: Family Medicine

## 2021-03-09 DIAGNOSIS — Z1231 Encounter for screening mammogram for malignant neoplasm of breast: Secondary | ICD-10-CM | POA: Insufficient documentation

## 2021-03-14 ENCOUNTER — Ambulatory Visit (INDEPENDENT_AMBULATORY_CARE_PROVIDER_SITE_OTHER): Payer: PPO | Admitting: Family Medicine

## 2021-03-14 ENCOUNTER — Encounter: Payer: Self-pay | Admitting: Family Medicine

## 2021-03-14 ENCOUNTER — Ambulatory Visit (INDEPENDENT_AMBULATORY_CARE_PROVIDER_SITE_OTHER)
Admission: RE | Admit: 2021-03-14 | Discharge: 2021-03-14 | Disposition: A | Discharge status: Home / Self Care | Payer: PPO | Source: Ambulatory Visit | Attending: Family Medicine | Admitting: Family Medicine

## 2021-03-14 ENCOUNTER — Other Ambulatory Visit: Payer: Self-pay

## 2021-03-14 VITALS — BP 122/72 | HR 61 | Temp 97.5°F | Ht 62.0 in | Wt 142.0 lb

## 2021-03-14 DIAGNOSIS — M25552 Pain in left hip: Secondary | ICD-10-CM | POA: Insufficient documentation

## 2021-03-14 MED ORDER — NAPROXEN 375 MG PO TABS: 375.0000 mg | ORAL_TABLET | Freq: Two times a day (BID) | ORAL | 0 refills | Status: DC

## 2021-03-14 NOTE — Assessment & Plan Note (Addendum)
Exam not consistent with trochanteric bursitis but rather sciatica and possible hip osteoarthritis. Check hip films today - significant loss of joint space on my read.  Treat with naprosyn 375mg  bid x 1 wk then PRN, take with food.  May continue ice, tens unit, topical voltaren otherwise.  Will refer to orthopedist for further evaluation.

## 2021-03-14 NOTE — Progress Notes (Signed)
Patient ID: Kathy Baldwin, female    DOB: 1945/11/21, 75 y.o.   MRN: [id number]  This visit was conducted in person.  BP 122/72 (BP Location: Left Arm, Patient Position: Sitting, Cuff Size: Normal)   Pulse 61   Temp (!) 97.5 F (36.4 C) (Temporal)   Ht 5\' 2"  (1.575 m)   Wt 142 lb (64.4 kg)   SpO2 97%   BMI 25.97 kg/m    CC: hip pain Subjective:   HPI: Kathy Baldwin is a 75 y.o. female presenting on 03/14/2021 for Hip Pain   5d h/o L upper hip pain that started after increased activity while preparing for family company this past weekend. Pain comes and goes. Pain does radiation laterally down leg to below the knee. When active pain, limits ambulation. Denies inciting trauma/injury or falls.    Treated with TENS unit with benefit, as well as ice pack, voltaren topically, and diclofenac tablets.   Saw Dr 05/15/2021 12/2020 for L trochanteric bursitis s/p steroid injection.  They would like a steroid shot. Dr 01/2021 is out of the office this week.  Known osteopenia managed by endocrinology, on Reclast however did not receive this past year (skipped a year).   Recent treatment with amoxicillin for strep UTI.      Relevant past medical, surgical, family and social history reviewed and updated as indicated. Interim medical history since our last visit reviewed. Allergies and medications reviewed and updated. Outpatient Medications Prior to Visit  Medication Sig Dispense Refill   aspirin EC 325 MG tablet Take 1 tablet (325 mg total) by mouth daily.     Calcium Carb-Cholecalciferol (CALCIUM 600+D) 600-800 MG-UNIT TABS Take by mouth daily. 2 capsule daily     COLLAGEN PO Take by mouth.     conjugated estrogens (PREMARIN) vaginal cream Use a pea-sized amount externally and internally twice a week at night 42.5 g 1   Cyanocobalamin (B-12) 2500 MCG TABS Take 1 tablet by mouth daily. Pt taking 1/2 of a 5000 mcg tablet     donepezil (ARICEPT) 10 MG tablet Take 10 mg by mouth at  bedtime.      famotidine (PEPCID) 20 MG tablet Take 1 tablet (20 mg total) by mouth 2 (two) times daily as needed for heartburn or indigestion. 180 tablet 3   FIBER PO Take by mouth.     fluticasone (FLONASE) 50 MCG/ACT nasal spray SHAKE LIQUID AND USE 2 SPRAYS IN EACH NOSTRIL DAILY 16 g 2   meclizine (ANTIVERT) 25 MG tablet Take 25 mg by mouth 3 (three) times daily as needed for dizziness.     memantine (NAMENDA) 10 MG tablet Take 10 mg by mouth 2 (two) times daily.     mirabegron ER (MYRBETRIQ) 25 MG TB24 tablet Take 1 tablet (25 mg total) by mouth daily. 30 tablet 1   Multiple Vitamin (MULTIVITAMIN) tablet Take 1 tablet by mouth daily.     Multiple Vitamins-Minerals (HAIR/SKIN/NAILS/BIOTIN PO) Take 1 tablet by mouth daily.     omeprazole (PRILOSEC) 40 MG capsule Take 1 capsule (40 mg total) by mouth daily. For 3 weeks then as needed 90 capsule 3   PEDIASURE/FIBER (PEDIASURE/FIBER) LIQD Take 10 mLs by mouth.     polyethylene glycol (MIRALAX / GLYCOLAX) packet Take 17 g by mouth daily.     rosuvastatin (CRESTOR) 10 MG tablet TAKE 1 TABLET(10 MG) BY MOUTH AT BEDTIME 90 tablet 3   amoxicillin (AMOXIL) 875 MG tablet Take 1 tablet (875 mg total)  by mouth 2 (two) times daily. (Patient not taking: Reported on 03/14/2021) 10 tablet 0   No facility-administered medications prior to visit.     Per HPI unless specifically indicated in ROS section below Review of Systems  Objective:  BP 122/72 (BP Location: Left Arm, Patient Position: Sitting, Cuff Size: Normal)   Pulse 61   Temp (!) 97.5 F (36.4 C) (Temporal)   Ht 5\' 2"  (1.575 m)   Wt 142 lb (64.4 kg)   SpO2 97%   BMI 25.97 kg/m   Wt Readings from Last 3 Encounters:  03/14/21 142 lb (64.4 kg)  03/05/21 142 lb (64.4 kg)  12/14/20 145 lb (65.8 kg)      Physical Exam Vitals and nursing note reviewed.  Constitutional:      Appearance: Normal appearance. She is not ill-appearing.  Musculoskeletal:        General: Tenderness present.      Right lower leg: No edema.     Left lower leg: No edema.     Comments:  No pain midline spine No paraspinous mm tenderness Discomfort with SLR on left but no shooting pain down leg. Pain with with int/ext rotation at left hip. No pain at GTB bilaterally.  ++ pain at L sciatic notch  Skin:    General: Skin is warm and dry.     Findings: No rash.  Neurological:     Mental Status: She is alert.     Sensory: Sensation is intact.     Motor: Motor function is intact.     Deep Tendon Reflexes:     Reflex Scores:      Patellar reflexes are 2+ on the right side and 2+ on the left side.    Comments: 5/5 strength BLE      Assessment & Plan:  This visit occurred during the SARS-CoV-2 public health emergency.  Safety protocols were in place, including screening questions prior to the visit, additional usage of staff PPE, and extensive cleaning of exam room while observing appropriate contact time as indicated for disinfecting solutions.   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 Check hip films today - significant loss of joint space on my read.  Treat with naprosyn 375mg  bid x 1 wk then PRN, take with food.  May continue ice, tens unit, topical voltaren otherwise.  Will refer to orthopedist for further evaluation.        Relevant Orders   DG Hip Unilat W OR W/O Pelvis 2-3 Views Left   Ambulatory referral to Orthopedic Surgery     Meds ordered this encounter  Medications   naproxen (NAPROSYN) 375 MG tablet    Sig: Take 1 tablet (375 mg total) by mouth 2 (two) times daily with a meal.    Dispense:  30 tablet    Refill:  0    Orders Placed This Encounter  Procedures   DG Hip Unilat W OR W/O Pelvis 2-3 Views Left    Standing Status:   Future    Number of Occurrences:   1    Standing Expiration Date:   03/14/2022    Order Specific Question:   Reason for  Exam (SYMPTOM  OR DIAGNOSIS REQUIRED)    Answer:   left hip pain for 1 wk    Order Specific Question:   Preferred imaging location?    Answer:  [facility]   Ambulatory referral to Orthopedic Surgery    Referral Priority:   Routine    Referral Type:   Surgical    Referral Reason:   Specialty Services Required    Requested Specialty:   Orthopedic Surgery    Number of Visits Requested:   1    Patient Instructions  Left hip xray today I think you have hip arthritis causing this pain, not bursitis. You may also have component of left sided sciatica.  Treat with naprosyn 375mg  twice daily for 1 week then as needed. May continue ice/heating pad (whichever soothes better), tens unit use, tylenol, voltaren gel.  We will refer you to an orthopedist for further evaluation.   Follow up plan: No follow-ups on file.  , MD

## 2021-03-14 NOTE — Patient Instructions (Signed)
Left hip xray today I think you have hip arthritis causing this pain, not bursitis. You may also have component of left sided sciatica.  Treat with naprosyn 375mg  twice daily for 1 week then as needed. May continue ice/heating pad (whichever soothes better), tens unit use, tylenol, voltaren gel.  We will refer you to an orthopedist for further evaluation.

## 2021-03-15 ENCOUNTER — Encounter: Payer: Self-pay | Admitting: Family Medicine

## 2021-03-15 DIAGNOSIS — N6310 Unspecified lump in the right breast, unspecified quadrant: Secondary | ICD-10-CM

## 2021-03-18 ENCOUNTER — Other Ambulatory Visit: Payer: Self-pay | Admitting: Family Medicine

## 2021-03-19 ENCOUNTER — Telehealth: Payer: Self-pay

## 2021-03-19 DIAGNOSIS — Z1212 Encounter for screening for malignant neoplasm of rectum: Secondary | ICD-10-CM | POA: Diagnosis not present

## 2021-03-19 DIAGNOSIS — Z1211 Encounter for screening for malignant neoplasm of colon: Secondary | ICD-10-CM | POA: Diagnosis not present

## 2021-03-19 LAB — COLOGUARD: Cologuard: NEGATIVE

## 2021-03-19 NOTE — Progress Notes (Addendum)
Chronic Care Management Pharmacy Assistant   Name: Kathy Baldwin  MRN: [id number] DOB: April 27, 1946  Reason for Encounter: Medication Adherence and Delivery Coordination  Recent office visits:  03/14/21 - PCP - Left hip xray today. I think you have hip arthritis causing this pain, not bursitis. You may also have component of left sided sciatica. Treat with naprosyn 375mg  twice daily for 1 week then as needed.  03/05/21 - PCP - Urinalysis today. Try myrbetriq for bladder symptoms  Recent consult visits:  None since last CCM contact  Hospital visits:  None in previous 6 months  Medications: Outpatient Encounter Medications as of 03/19/2021  Medication Sig   aspirin EC 325 MG tablet Take 1 tablet (325 mg total) by mouth daily.   Calcium Carb-Cholecalciferol (CALCIUM 600+D) 600-800 MG-UNIT TABS Take by mouth daily. 2 capsule daily   COLLAGEN PO Take by mouth.   conjugated estrogens (PREMARIN) vaginal cream Use a pea-sized amount externally and internally twice a week at night   Cyanocobalamin (B-12) 2500 MCG TABS Take 1 tablet by mouth daily. Pt taking 1/2 of a 5000 mcg tablet   donepezil (ARICEPT) 10 MG tablet Take 10 mg by mouth at bedtime.    famotidine (PEPCID) 20 MG tablet Take 1 tablet (20 mg total) by mouth 2 (two) times daily as needed for heartburn or indigestion.   FIBER PO Take by mouth.   fluticasone (FLONASE) 50 MCG/ACT nasal spray SHAKE LIQUID AND USE 2 SPRAYS IN EACH NOSTRIL DAILY   meclizine (ANTIVERT) 25 MG tablet Take 25 mg by mouth 3 (three) times daily as needed for dizziness.   memantine (NAMENDA) 10 MG tablet Take 10 mg by mouth 2 (two) times daily.   mirabegron ER (MYRBETRIQ) 25 MG TB24 tablet Take 1 tablet (25 mg total) by mouth daily.   Multiple Vitamin (MULTIVITAMIN) tablet Take 1 tablet by mouth daily.   Multiple Vitamins-Minerals (HAIR/SKIN/NAILS/BIOTIN PO) Take 1 tablet by mouth daily.   naproxen (NAPROSYN) 375 MG tablet Take 1 tablet (375 mg total) by  mouth 2 (two) times daily with a meal.   omeprazole (PRILOSEC) 40 MG capsule Take 1 capsule (40 mg total) by mouth daily. For 3 weeks then as needed   PEDIASURE/FIBER (PEDIASURE/FIBER) LIQD Take 10 mLs by mouth.   polyethylene glycol (MIRALAX / GLYCOLAX) packet Take 17 g by mouth daily.   rosuvastatin (CRESTOR) 10 MG tablet TAKE 1 TABLET(10 MG) BY MOUTH AT BEDTIME   No facility-administered encounter medications on file as of 03/19/2021.    BP Readings from Last 3 Encounters:  03/14/21 122/72  03/05/21 130/70  12/14/20 (!) 160/68    No results found for: HGBA1C   Patient obtains medications through Packs   90 Days   Last adherence delivery date:  12/29/2020      Patient is due for next adherence delivery on: 03/28/2021  Spoke with patient on 03/19/2021 reviewed medications and coordinated delivery.  This delivery to include: Adherence Packaging  90 Days  Packs:  Amlodipine 2.5 mg one daily - 1 breakfast            Donepezil 10 mg one daily - 1 bedtime            Famotidine 20 mg one daily - 1 breakfast, 1 evening meal            Memantine 10 mg - 1 breakfast, 1 evening meal              Rosuvastatin 10 mg  one daily - 1 bedtime            Myrbetriq 25 mg -- take 1 tablet at breakfast (last filled 6/30 30 DS - will put in packs if insurance allows early fill. Otherwise, will send 90 DS VIAL)   PRN/VIAL medications:    Flonase- 2 sprays each nostril PRN   Patient declined the following medications this month: Premarin vaginal cream - twice weekly at night (patient has plenty on hand)  Naproxen 375mg  take 1 tablet 2 times daily with meal (patient reports she has adequate supply for now)  Omeprazole 40 mg --  1 tablet evening meal (90 DS delivered 02/13/21), pt does not want to sync this with other meds due to insurance cost    No refill request needed from PCP.  Confirmed delivery date of 03/28/21, advised patient that pharmacy will contact her the morning of delivery.  Recent blood  pressure readings are as follows: 03/17/2021  119/69  P-  no other readings available   Follow-Up:  Coordination of Enhanced Pharmacy Services and Pharmacist Review  05/18/2021, CPP notified  Phil Dopp, [facility] Clinical Pharmacy Assistant [phone]  New - started Myrbetriq on 03/08/21. Reports she is doing well on this so far. She would like a 90 DS next month. Discrepancy - per PCP visit 03/06/21, pt off amlodipine, but per pharmacy records and patient report she has still been taking it. Refill is needed.  I have reviewed the care management and care coordination activities outlined in this encounter and I am certifying that I agree with the content of this note. Contacted patient and send PCP a request for amlodipine and Mybetriq refills. They are okay with keeping Myrbetriq out of packs if the refill is too soon per insurance.  03/08/21, PharmD Clinical Pharmacist [facility] at [facility] [phone]

## 2021-03-22 ENCOUNTER — Telehealth: Payer: Self-pay

## 2021-03-22 MED ORDER — AMLODIPINE BESYLATE 2.5 MG PO TABS: 2.5000 mg | ORAL_TABLET | Freq: Every day | ORAL | 2 refills | Status: DC

## 2021-03-22 MED ORDER — MIRABEGRON ER 25 MG PO TB24: 25.0000 mg | ORAL_TABLET | Freq: Every day | ORAL | 2 refills | Status: DC

## 2021-03-22 NOTE — Telephone Encounter (Signed)
New Rx's sent in. Thanks.

## 2021-03-22 NOTE — Telephone Encounter (Signed)
Patient has been taking amlodipine 2.5 mg daily per pharmacy records and patient report. This was restarted by Neurology 09/26/20 with the assumption PCP would take over refills after that. Please send refill amlodipine 2.5 mg daily 90 DS to [facility] if approved.  Also, patient is doing well on Myrbetriq and would like rx for 90 DS if approved.  Phil Dopp, PharmD Clinical Pharmacist [facility] at [facility] [phone]

## 2021-03-26 LAB — COLOGUARD: COLOGUARD: NEGATIVE

## 2021-03-27 DIAGNOSIS — G4733 Obstructive sleep apnea (adult) (pediatric): Secondary | ICD-10-CM | POA: Diagnosis not present

## 2021-03-28 ENCOUNTER — Telehealth: Payer: Self-pay

## 2021-03-28 ENCOUNTER — Encounter: Payer: Self-pay | Admitting: Family Medicine

## 2021-03-28 NOTE — Telephone Encounter (Signed)
Received faxed negative Cologuard results.  Abstracted results, notified pt via MyChart and placed to be scanned.

## 2021-04-04 NOTE — Telephone Encounter (Signed)
Spoke with wife at husband's visit today  Noted lump to R lateral breast 10 o clock position 2-3cm from nipple after recent screening mammogram  Tender to palpation without skin changes.  Will order dx mammo and [place] for right side

## 2021-04-09 ENCOUNTER — Encounter: Payer: Self-pay | Admitting: Family Medicine

## 2021-04-16 ENCOUNTER — Other Ambulatory Visit: Payer: Self-pay | Admitting: Family Medicine

## 2021-04-16 DIAGNOSIS — N6310 Unspecified lump in the right breast, unspecified quadrant: Secondary | ICD-10-CM

## 2021-04-25 DIAGNOSIS — Z8679 Personal history of other diseases of the circulatory system: Secondary | ICD-10-CM | POA: Diagnosis not present

## 2021-04-25 DIAGNOSIS — R413 Other amnesia: Secondary | ICD-10-CM | POA: Diagnosis not present

## 2021-04-25 DIAGNOSIS — R4182 Altered mental status, unspecified: Secondary | ICD-10-CM | POA: Diagnosis not present

## 2021-04-25 NOTE — Telephone Encounter (Signed)
Will forward to Dr Reece Agar to make aware

## 2021-04-27 ENCOUNTER — Ambulatory Visit
Admission: RE | Admit: 2021-04-27 | Discharge: 2021-04-27 | Disposition: A | Discharge status: Home / Self Care | Payer: PPO | Source: Ambulatory Visit | Attending: Family Medicine | Admitting: Family Medicine

## 2021-04-27 ENCOUNTER — Other Ambulatory Visit: Payer: Self-pay

## 2021-04-27 DIAGNOSIS — I6523 Occlusion and stenosis of bilateral carotid arteries: Secondary | ICD-10-CM | POA: Diagnosis not present

## 2021-04-27 DIAGNOSIS — N6001 Solitary cyst of right breast: Secondary | ICD-10-CM | POA: Diagnosis not present

## 2021-04-27 DIAGNOSIS — N6310 Unspecified lump in the right breast, unspecified quadrant: Secondary | ICD-10-CM

## 2021-04-27 DIAGNOSIS — R001 Bradycardia, unspecified: Secondary | ICD-10-CM | POA: Diagnosis not present

## 2021-04-27 DIAGNOSIS — E782 Mixed hyperlipidemia: Secondary | ICD-10-CM | POA: Diagnosis not present

## 2021-04-27 DIAGNOSIS — R922 Inconclusive mammogram: Secondary | ICD-10-CM | POA: Diagnosis not present

## 2021-04-27 DIAGNOSIS — I700 Atherosclerosis of aorta: Secondary | ICD-10-CM | POA: Diagnosis not present

## 2021-04-27 DIAGNOSIS — I10 Essential (primary) hypertension: Secondary | ICD-10-CM | POA: Diagnosis not present

## 2021-04-27 DIAGNOSIS — N6311 Unspecified lump in the right breast, upper outer quadrant: Secondary | ICD-10-CM | POA: Diagnosis not present

## 2021-04-27 DIAGNOSIS — G4733 Obstructive sleep apnea (adult) (pediatric): Secondary | ICD-10-CM | POA: Diagnosis not present

## 2021-06-06 ENCOUNTER — Ambulatory Visit (INDEPENDENT_AMBULATORY_CARE_PROVIDER_SITE_OTHER): Payer: PPO | Admitting: Family Medicine

## 2021-06-06 ENCOUNTER — Encounter: Payer: Self-pay | Admitting: Family Medicine

## 2021-06-06 VITALS — BP 130/70 | HR 71 | Temp 97.8°F | Ht 62.0 in | Wt 147.0 lb

## 2021-06-06 DIAGNOSIS — M7062 Trochanteric bursitis, left hip: Secondary | ICD-10-CM | POA: Diagnosis not present

## 2021-06-06 MED ORDER — TRIAMCINOLONE ACETONIDE 40 MG/ML IJ SUSP
40.0000 mg | Freq: Once | INTRAMUSCULAR | Status: AC
  Administered 2021-06-06: 40 mg via INTRA_ARTICULAR

## 2021-06-06 NOTE — Progress Notes (Signed)
    Alizee Maple T. Brunette Lavalle, MD, CAQ Sports Medicine [facility] at [facility] [address] [place], [address]  Phone: [phone]  FAX: [phone]  Kathy Baldwin - 75 y.o. female  MRN [id number]  Date of Birth: April 06, 1946  Date: 06/06/2021  PCP: [name], MD  Referral: [name], MD  Chief Complaint  Patient presents with   Hip Pain    Left Injection    This visit occurred during the SARS-CoV-2 public health emergency.  Safety protocols were in place, including screening questions prior to the visit, additional usage of staff PPE, and extensive cleaning of exam room while observing appropriate contact time as indicated for disinfecting solutions.    Aspiration/Injection Procedure Note Kathy Baldwin 1946/01/14 Date of procedure: 06/06/2021  Procedure: Large Joint Aspiration / Injection of Hip, Trochanteric Bursa, L Indications: Pain  Procedure Details Verbal consent obtained. Risks, benefits, and alternatives reviewed. Greater trochanter sterilely prepped with Chloraprep. Ethyl Chloride used for anesthesia. 9 cc of Lidocaine 1% injected with 1 mL of Kenalog 40 mg into trochanteric bursa at area of maximal tenderness at greater trochanter. Needle taken to bone to troch bursa, flows easily. Bursa massaged. No bleeding and no complications. Decreased pain after injection. Needle: 22 gauge spinal needle Medication: 1 mL of Kenalog 40 mg     ICD-10-CM   1. Trochanteric bursitis of left hip  M70.62 triamcinolone acetonide (KENALOG-40) injection 40 mg      Meds ordered this encounter  Medications   triamcinolone acetonide (KENALOG-40) injection 40 mg   There are no discontinued medications. No orders of the defined types were placed in this encounter.  Signed,  Elpidio Galea. Jubal Rademaker, MD

## 2021-06-18 ENCOUNTER — Telehealth: Payer: Self-pay

## 2021-06-18 NOTE — Chronic Care Management (AMB) (Addendum)
Chronic Care Management Pharmacy Assistant   Name: Kathy Baldwin  MRN: [id number] DOB: 1945-11-27  Reason for Encounter: Medication Adherence and Delivery Coordination  Recent office visits:  06/06/21-Family Medicine-Patient presented for left hip pain. Triamcinolone injection 40mg .  Recent consult visits:  04/27/21-Cardiology-Patient presented for follow up of syncope.ECG,Increase amlodipine to 5mg  once daily,low sodium diet,increase physical activity,follow up 6 months. 04/25/21-Neurology-Patient presented for memory loss,altered mental status follow up,continue aricept and namenda.   Hospital visits:  None in previous 6 months  Medications: Outpatient Encounter Medications as of 06/18/2021  Medication Sig   amLODipine (NORVASC) 2.5 MG tablet Take 1 tablet (2.5 mg total) by mouth daily.   aspirin EC 325 MG tablet Take 1 tablet (325 mg total) by mouth daily.   Calcium Carb-Cholecalciferol (CALCIUM 600+D) 600-800 MG-UNIT TABS Take by mouth daily. 2 capsule daily   COLLAGEN PO Take by mouth.   conjugated estrogens (PREMARIN) vaginal cream Use a pea-sized amount externally and internally twice a week at night   Cyanocobalamin (B-12) 2500 MCG TABS Take 1 tablet by mouth daily. Pt taking 1/2 of a 5000 mcg tablet   donepezil (ARICEPT) 10 MG tablet Take 10 mg by mouth at bedtime.    famotidine (PEPCID) 20 MG tablet Take 1 tablet (20 mg total) by mouth 2 (two) times daily as needed for heartburn or indigestion.   FIBER PO Take by mouth.   fluticasone (FLONASE) 50 MCG/ACT nasal spray SHAKE LIQUID AND USE 2 SPRAYS IN EACH NOSTRIL DAILY   meclizine (ANTIVERT) 25 MG tablet Take 25 mg by mouth 3 (three) times daily as needed for dizziness.   memantine (NAMENDA) 10 MG tablet Take 10 mg by mouth 2 (two) times daily.   mirabegron ER (MYRBETRIQ) 25 MG TB24 tablet Take 1 tablet (25 mg total) by mouth daily.   Multiple Vitamin (MULTIVITAMIN) tablet Take 1 tablet by mouth daily.   Multiple  Vitamins-Minerals (HAIR/SKIN/NAILS/BIOTIN PO) Take 1 tablet by mouth daily.   naproxen (NAPROSYN) 375 MG tablet Take 1 tablet (375 mg total) by mouth 2 (two) times daily with a meal.   omeprazole (PRILOSEC) 40 MG capsule Take 1 capsule (40 mg total) by mouth daily. For 3 weeks then as needed   PEDIASURE/FIBER (PEDIASURE/FIBER) LIQD Take 10 mLs by mouth.   polyethylene glycol (MIRALAX / GLYCOLAX) packet Take 17 g by mouth daily.   rosuvastatin (CRESTOR) 10 MG tablet TAKE 1 TABLET(10 MG) BY MOUTH AT BEDTIME   No facility-administered encounter medications on file as of 06/18/2021.   BP Readings from Last 3 Encounters:  06/06/21 130/70  03/14/21 122/72  03/05/21 130/70    No results found for: HGBA1C    Recent OV, Consult or Hospital visit:  Recent medication changes indicated: 04/27/21-Cardiology-Patient presented for follow up of syncope.ECG,Increase amlodipine to 5mg  once daily,low sodium diet,increase physical activity,follow up 6 months.   Last adherence delivery date:03/28/21      Patient is due for next adherence delivery on: 06/27/21  Spoke with patient on 06/18/21 reviewed medications and coordinated delivery.  This delivery to include: Adherence Packaging  90 Days  Packs: Amlodipine 5 mg one daily - 1 breakfast (changed 04/27/21)            Donepezil 10 mg one daily - 1 bedtime            Famotidine 20 mg one daily - 1 breakfast, 1 evening meal            Memantine 10 mg - 1  breakfast, 1 evening meal              Rosuvastatin 10 mg one daily - 1 bedtime            Myrbetriq 25 mg -- take 1 tablet at breakfast (patient has 18 tablets left 06/18/21)  VIAL medications:   Flonase- 2 sprays each nostril PRN   Omeprazole 40 mg take 1 tablet as needed   Patient declined the following medications this month: Premarin vaginal cream - twice weekly at night (patient has plenty on hand)             Naproxen 375mg  take 1 tablet 2 times daily with meal (patient reports she has  adequate supply for now)  Any concerns about your medications? The patient wants to add the myrbetric in packaging.  How often do you forget or accidentally miss a dose? Rarely the patient report she forgot to take 2 of the amlodipine 2.5mg  for a while but knows the medication was increased to 5mg  1 tablet daily   Do you use a pillbox? No likes the bubble packs  Is patient in packaging Yes  What is the date on your next pill pack? Not where they could read the next date.  Any concerns or issues with your packaging? No issues identified  No refill request needed.  Confirmed delivery date of 06/27/21, advised patient that pharmacy will contact them the morning of delivery.   Annual wellness visit in last year? 03/05/21 Most Recent BP reading: 130/70  71-P  06/06/21  03/07/21, CPP notified  06/08/21, [facility] Clincal Pharmacy Assistant [phone]  I have reviewed the care management and care coordination activities outlined in this encounter and I am certifying that I agree with the content of this note. No further action required.  [facility], PharmD Clinical Pharmacist [facility] at [facility] [phone]

## 2021-07-09 ENCOUNTER — Other Ambulatory Visit: Payer: Self-pay

## 2021-07-09 ENCOUNTER — Encounter: Payer: Self-pay | Admitting: Family Medicine

## 2021-07-09 ENCOUNTER — Ambulatory Visit (INDEPENDENT_AMBULATORY_CARE_PROVIDER_SITE_OTHER): Payer: PPO | Admitting: Family Medicine

## 2021-07-09 VITALS — BP 130/62 | HR 65 | Temp 98.0°F | Ht 62.0 in | Wt 141.5 lb

## 2021-07-09 DIAGNOSIS — M542 Cervicalgia: Secondary | ICD-10-CM | POA: Diagnosis not present

## 2021-07-09 NOTE — Progress Notes (Signed)
Preesha Benjamin T. Yehudis Monceaux, MD, CAQ Sports Medicine [facility] [address] [place], [address]  Phone: [phone]  FAX: [phone]  Kathy Baldwin - 75 y.o. female  MRN [id number]  Date of Birth: 09-08-1946  Date: 07/09/2021  PCP: [name], MD  Referral: [name], MD  Chief Complaint  Patient presents with   Neck Pain    Shooting pains up left side of neck started last week    This visit occurred during the SARS-CoV-2 public health emergency.  Safety protocols were in place, including screening questions prior to the visit, additional usage of staff PPE, and extensive cleaning of exam room while observing appropriate contact time as indicated for disinfecting solutions.   Subjective:   Kathy Baldwin is a 76 y.o. very pleasant female patient with Body mass index is 25.88 kg/m. who presents with the following:  Several days ago, thought more pain up into the l side of her kneck.  Thought that the only thing that she could have done would be doing some yard work with lifting.  No numbness, tingling, and symptoms are restricted to the L lateral side of the neck.  Electric sensation behind her ear. Does not last long.  Tylenol and warm compress.   Review of Systems is noted in the HPI, as appropriate   Objective:   BP 130/62   Pulse 65   Temp 98 F (36.7 C) (Temporal)   Ht 5\' 2"  (1.575 m)   Wt 141 lb 8 oz (64.2 kg)   SpO2 97%   BMI 25.88 kg/m    GEN: alert,appropriate PSYCH: Normally interactive. Cooperative during the interview.   CERVICAL SPINE EXAM Range of motion: Flexion, extension, lateral bending, and rotation: full Pain with terminal motion: minimal Spinous Processes: NT SCM: NT Upper paracervical muscles: L lateral ttp mildly Upper traps: NT C5-T1 intact, sensation and motor   Radiology: No results found.  Assessment and Plan:     ICD-10-CM   1. Cervicalgia  M54.2      L  lateral - likely secondary to use from work outside with some peripheral nerve involvement without focal nerve root outlet involvement.  Continue tylenol and warm compresses.  Rare NSAIDS for a few days should be ok. (GFR = 70)  No orders of the defined types were placed in this encounter.  Medications Discontinued During This Encounter  Medication Reason   amLODipine (NORVASC) 2.5 MG tablet Change in therapy   No orders of the defined types were placed in this encounter.   Follow-up: No follow-ups on file.  Dragon [employer] One speech-to-text software was used for transcription in this dictation.  Possible transcriptional errors can occur using .   Signed,  Animal nutritionist. Taedyn Glasscock, MD   Outpatient Encounter Medications as of 07/09/2021  Medication Sig   amLODipine (NORVASC) 5 MG tablet Take 5 mg by mouth daily.   aspirin EC 325 MG tablet Take 1 tablet (325 mg total) by mouth daily.   Calcium Carb-Cholecalciferol (CALCIUM 600+D) 600-800 MG-UNIT TABS Take by mouth daily. 2 capsule daily   COLLAGEN PO Take by mouth.   conjugated estrogens (PREMARIN) vaginal cream Use a pea-sized amount externally and internally twice a week at night   Cyanocobalamin (B-12) 2500 MCG TABS Take 1 tablet by mouth daily. Pt taking 1/2 of a 5000 mcg tablet   donepezil (ARICEPT) 10 MG tablet Take 10 mg by mouth at bedtime.    famotidine (PEPCID) 20 MG  tablet Take 1 tablet (20 mg total) by mouth 2 (two) times daily as needed for heartburn or indigestion.   FIBER PO Take by mouth.   fluticasone (FLONASE) 50 MCG/ACT nasal spray SHAKE LIQUID AND USE 2 SPRAYS IN EACH NOSTRIL DAILY   meclizine (ANTIVERT) 25 MG tablet Take 25 mg by mouth 3 (three) times daily as needed for dizziness.   memantine (NAMENDA) 10 MG tablet Take 10 mg by mouth 2 (two) times daily.   mirabegron ER (MYRBETRIQ) 25 MG TB24 tablet Take 1 tablet (25 mg total) by mouth daily.   Multiple Vitamin (MULTIVITAMIN) tablet Take 1 tablet by  mouth daily.   Multiple Vitamins-Minerals (HAIR/SKIN/NAILS/BIOTIN PO) Take 1 tablet by mouth daily.   naproxen (NAPROSYN) 375 MG tablet Take 1 tablet (375 mg total) by mouth 2 (two) times daily with a meal.   omeprazole (PRILOSEC) 40 MG capsule Take 1 capsule (40 mg total) by mouth daily. For 3 weeks then as needed   PEDIASURE/FIBER (PEDIASURE/FIBER) LIQD Take 10 mLs by mouth.   polyethylene glycol (MIRALAX / GLYCOLAX) packet Take 17 g by mouth daily.   rosuvastatin (CRESTOR) 10 MG tablet TAKE 1 TABLET(10 MG) BY MOUTH AT BEDTIME   [DISCONTINUED] amLODipine (NORVASC) 2.5 MG tablet Take 1 tablet (2.5 mg total) by mouth daily.   No facility-administered encounter medications on file as of 07/09/2021.

## 2021-09-11 ENCOUNTER — Inpatient Hospital Stay
Admission: EM | Admit: 2021-09-11 | Discharge: 2021-09-13 | DRG: 062 | Disposition: A | Discharge status: Home / Self Care | Payer: PPO | Attending: Internal Medicine | Admitting: Internal Medicine

## 2021-09-11 ENCOUNTER — Inpatient Hospital Stay: Payer: PPO

## 2021-09-11 ENCOUNTER — Emergency Department: Payer: PPO

## 2021-09-11 ENCOUNTER — Other Ambulatory Visit: Payer: Self-pay

## 2021-09-11 DIAGNOSIS — Z8673 Personal history of transient ischemic attack (TIA), and cerebral infarction without residual deficits: Secondary | ICD-10-CM | POA: Diagnosis present

## 2021-09-11 DIAGNOSIS — Z83438 Family history of other disorder of lipoprotein metabolism and other lipidemia: Secondary | ICD-10-CM

## 2021-09-11 DIAGNOSIS — M1990 Unspecified osteoarthritis, unspecified site: Secondary | ICD-10-CM | POA: Diagnosis present

## 2021-09-11 DIAGNOSIS — I6350 Cerebral infarction due to unspecified occlusion or stenosis of unspecified cerebral artery: Principal | ICD-10-CM | POA: Diagnosis present

## 2021-09-11 DIAGNOSIS — R413 Other amnesia: Secondary | ICD-10-CM | POA: Diagnosis not present

## 2021-09-11 DIAGNOSIS — Z8349 Family history of other endocrine, nutritional and metabolic diseases: Secondary | ICD-10-CM | POA: Diagnosis not present

## 2021-09-11 DIAGNOSIS — E785 Hyperlipidemia, unspecified: Secondary | ICD-10-CM | POA: Diagnosis present

## 2021-09-11 DIAGNOSIS — R29706 NIHSS score 6: Secondary | ICD-10-CM | POA: Diagnosis present

## 2021-09-11 DIAGNOSIS — G8194 Hemiplegia, unspecified affecting left nondominant side: Secondary | ICD-10-CM | POA: Diagnosis present

## 2021-09-11 DIAGNOSIS — Z20822 Contact with and (suspected) exposure to covid-19: Secondary | ICD-10-CM | POA: Diagnosis present

## 2021-09-11 DIAGNOSIS — E559 Vitamin D deficiency, unspecified: Secondary | ICD-10-CM | POA: Diagnosis present

## 2021-09-11 DIAGNOSIS — I10 Essential (primary) hypertension: Secondary | ICD-10-CM | POA: Diagnosis present

## 2021-09-11 DIAGNOSIS — Z8261 Family history of arthritis: Secondary | ICD-10-CM

## 2021-09-11 DIAGNOSIS — Z823 Family history of stroke: Secondary | ICD-10-CM | POA: Diagnosis not present

## 2021-09-11 DIAGNOSIS — Z809 Family history of malignant neoplasm, unspecified: Secondary | ICD-10-CM | POA: Diagnosis not present

## 2021-09-11 DIAGNOSIS — G4733 Obstructive sleep apnea (adult) (pediatric): Secondary | ICD-10-CM | POA: Diagnosis present

## 2021-09-11 DIAGNOSIS — K219 Gastro-esophageal reflux disease without esophagitis: Secondary | ICD-10-CM | POA: Diagnosis present

## 2021-09-11 DIAGNOSIS — R531 Weakness: Secondary | ICD-10-CM | POA: Diagnosis not present

## 2021-09-11 DIAGNOSIS — M810 Age-related osteoporosis without current pathological fracture: Secondary | ICD-10-CM | POA: Diagnosis present

## 2021-09-11 DIAGNOSIS — Z833 Family history of diabetes mellitus: Secondary | ICD-10-CM

## 2021-09-11 DIAGNOSIS — Z66 Do not resuscitate: Secondary | ICD-10-CM | POA: Diagnosis present

## 2021-09-11 DIAGNOSIS — I639 Cerebral infarction, unspecified: Secondary | ICD-10-CM

## 2021-09-11 DIAGNOSIS — H9190 Unspecified hearing loss, unspecified ear: Secondary | ICD-10-CM | POA: Diagnosis present

## 2021-09-11 DIAGNOSIS — Z8249 Family history of ischemic heart disease and other diseases of the circulatory system: Secondary | ICD-10-CM | POA: Diagnosis not present

## 2021-09-11 DIAGNOSIS — F0390 Unspecified dementia without behavioral disturbance: Secondary | ICD-10-CM | POA: Diagnosis present

## 2021-09-11 DIAGNOSIS — I6389 Other cerebral infarction: Secondary | ICD-10-CM | POA: Diagnosis not present

## 2021-09-11 DIAGNOSIS — Z825 Family history of asthma and other chronic lower respiratory diseases: Secondary | ICD-10-CM

## 2021-09-11 HISTORY — DX: Cerebral infarction, unspecified: I63.9

## 2021-09-11 LAB — COMPREHENSIVE METABOLIC PANEL
ALT: 20 U/L (ref 0–44)
AST: 36 U/L (ref 15–41)
Albumin: 4.3 g/dL (ref 3.5–5.0)
Alkaline Phosphatase: 80 U/L (ref 38–126)
Anion gap: 5 (ref 5–15)
BUN: 21 mg/dL (ref 8–23)
CO2: 27 mmol/L (ref 22–32)
Calcium: 9.4 mg/dL (ref 8.9–10.3)
Chloride: 110 mmol/L (ref 98–111)
Creatinine, Ser: 1.07 mg/dL — ABNORMAL HIGH (ref 0.44–1.00)
GFR, Estimated: 53 mL/min — ABNORMAL LOW (ref >60)
Glucose, Bld: 98 mg/dL (ref 70–99)
Potassium: 3.8 mmol/L (ref 3.5–5.1)
Sodium: 142 mmol/L (ref 135–145)
Total Bilirubin: 1 mg/dL (ref 0.3–1.2)
Total Protein: 7.5 g/dL (ref 6.5–8.1)

## 2021-09-11 LAB — GLUCOSE, CAPILLARY: Glucose-Capillary: 124 mg/dL — ABNORMAL HIGH (ref 70–99)

## 2021-09-11 LAB — PROTIME-INR
INR: 1.1 (ref 0.8–1.2)
Prothrombin Time: 14.1 seconds (ref 11.4–15.2)

## 2021-09-11 LAB — RESP PANEL BY RT-PCR (FLU A&B, COVID) ARPGX2
Influenza A by PCR: NEGATIVE
Influenza B by PCR: NEGATIVE
SARS Coronavirus 2 by RT PCR: NEGATIVE

## 2021-09-11 LAB — DIFFERENTIAL
Abs Immature Granulocytes: 0.01 10*3/uL (ref 0.00–0.07)
Basophils Absolute: 0 10*3/uL (ref 0.0–0.1)
Basophils Relative: 1 %
Eosinophils Absolute: 0.2 10*3/uL (ref 0.0–0.5)
Eosinophils Relative: 4 %
Immature Granulocytes: 0 %
Lymphocytes Relative: 43 %
Lymphs Abs: 2.8 10*3/uL (ref 0.7–4.0)
Monocytes Absolute: 0.5 10*3/uL (ref 0.1–1.0)
Monocytes Relative: 8 %
Neutro Abs: 2.9 10*3/uL (ref 1.7–7.7)
Neutrophils Relative %: 44 %

## 2021-09-11 LAB — CBG MONITORING, ED: Glucose-Capillary: 87 mg/dL (ref 70–99)

## 2021-09-11 LAB — CBC
HCT: 40 % (ref 36.0–46.0)
Hemoglobin: 13.1 g/dL (ref 12.0–15.0)
MCH: 31.6 pg (ref 26.0–34.0)
MCHC: 32.8 g/dL (ref 30.0–36.0)
MCV: 96.4 fL (ref 80.0–100.0)
Platelets: 223 10*3/uL (ref 150–400)
RBC: 4.15 MIL/uL (ref 3.87–5.11)
RDW: 13 % (ref 11.5–15.5)
WBC: 6.5 10*3/uL (ref 4.0–10.5)
nRBC: 0 % (ref 0.0–0.2)

## 2021-09-11 LAB — LIPID PANEL
Cholesterol: 170 mg/dL (ref 0–200)
HDL: 92 mg/dL (ref >40)
LDL Cholesterol: 66 mg/dL (ref 0–99)
Total CHOL/HDL Ratio: 1.8 RATIO
Triglycerides: 60 mg/dL (ref <150)
VLDL: 12 mg/dL (ref 0–40)

## 2021-09-11 LAB — HEMOGLOBIN A1C
Hgb A1c MFr Bld: 5.6 % (ref 4.8–5.6)
Mean Plasma Glucose: 114.02 mg/dL

## 2021-09-11 LAB — APTT: aPTT: 26 seconds (ref 24–36)

## 2021-09-11 LAB — MRSA NEXT GEN BY PCR, NASAL: MRSA by PCR Next Gen: NOT DETECTED

## 2021-09-11 LAB — TROPONIN I (HIGH SENSITIVITY)
Troponin I (High Sensitivity): 6 ng/L (ref <18)
Troponin I (High Sensitivity): 9 ng/L (ref <18)

## 2021-09-11 MED ORDER — DOCUSATE SODIUM 100 MG PO CAPS: 100.0000 mg | ORAL_CAPSULE | Freq: Two times a day (BID) | ORAL | Status: DC | PRN

## 2021-09-11 MED ORDER — CHLORHEXIDINE GLUCONATE CLOTH 2 % EX PADS
6.0000 | MEDICATED_PAD | Freq: Every day | CUTANEOUS | Status: DC
  Administered 2021-09-11 – 2021-09-12 (×2): 6 via TOPICAL
  Filled 2021-09-11: qty 6

## 2021-09-11 MED ORDER — TENECTEPLASE FOR STROKE
0.2500 mg/kg | PACK | Freq: Once | INTRAVENOUS | Status: AC
  Administered 2021-09-11: 16 mg via INTRAVENOUS

## 2021-09-11 MED ORDER — SODIUM CHLORIDE 0.9% FLUSH
3.0000 mL | Freq: Once | INTRAVENOUS | Status: AC
  Administered 2021-09-11: 3 mL via INTRAVENOUS

## 2021-09-11 MED ORDER — ONDANSETRON HCL 4 MG/2ML IJ SOLN: 4.0000 mg | Freq: Four times a day (QID) | INTRAMUSCULAR | Status: DC | PRN

## 2021-09-11 MED ORDER — LABETALOL HCL 5 MG/ML IV SOLN: 20.0000 mg | Freq: Once | INTRAVENOUS | Status: DC | PRN

## 2021-09-11 MED ORDER — POLYETHYLENE GLYCOL 3350 17 G PO PACK: 17.0000 g | PACK | Freq: Every day | ORAL | Status: DC | PRN

## 2021-09-11 MED ORDER — IOHEXOL 350 MG/ML SOLN
75.0000 mL | Freq: Once | INTRAVENOUS | Status: AC | PRN
  Administered 2021-09-11: 75 mL via INTRAVENOUS

## 2021-09-11 NOTE — ED Provider Notes (Signed)
[facility] Provider Note    Event Date/Time   First MD Initiated Contact with Patient 09/11/21 [phone]     (approximate)   History   Code Stroke   HPI  Kathy Baldwin is a 76 y.o. female with past medical history of hypertension, hyperlipidemia, stroke, and mild cognitive impairment with memory deficits who presents to the ED for possible stroke.  Patient reports that she woke up feeling fine this morning and then sometime just after 8 she noticed significant weakness affecting her left arm and left leg.  She states that both limbs feel "heavy" and numb to the touch.  She has had difficulty walking since the onset of symptoms and states she is not able to use her left arm.  She denies any vision changes or speech changes, has not noticed any numbness to her face or facial droop.  She does not take any blood thinners and denies any recent trauma to her head.  She had been feeling well when she went to bed last night, denies any recent illness.     Physical Exam   Triage Vital Signs: ED Triage Vitals [09/11/21 0838]  Enc Vitals Group     BP 98/64     Pulse Rate 71     Resp 16     Temp 98.4 F (36.9 C)     Temp Source Oral     SpO2 96 %     Weight      Height      Head Circumference      Peak Flow      Pain Score      Pain Loc      Pain Edu?      Excl. in GC?     Most recent vital signs: Vitals:   09/11/21 0930 09/11/21 0945  BP: (!) 172/64 (!) 155/65  Pulse: 63 (!) 55  Resp: (!) 21 20  Temp:    SpO2: 97% 97%     General: Awake, no distress.  CV:  Good peripheral perfusion.  Regular rate and rhythm, no murmurs, rubs, or gallops.  2+ radial pulses bilaterally. Resp:  Normal effort.  Lungs clear to auscultation bilaterally. Abd:  No distention.  Other:  2 out of 5 strength in left upper and left lower extremities, 5 out of 5 strength in right upper and lower extremities.  No facial droop noted.   ED Results / Procedures /  Treatments   Labs (all labs ordered are listed, but only abnormal results are displayed) Labs Reviewed  COMPREHENSIVE METABOLIC PANEL - Abnormal; Notable for the following components:      Result Value   Creatinine, Ser 1.07 (*)    GFR, Estimated 53 (*)    All other components within normal limits  RESP PANEL BY RT-PCR (FLU A&B, COVID) ARPGX2  PROTIME-INR  APTT  CBC  DIFFERENTIAL  URINALYSIS, ROUTINE W REFLEX MICROSCOPIC  CBG MONITORING, ED  TROPONIN I (HIGH SENSITIVITY)     EKG  ED ECG REPORT I, Chesley Noon, the attending physician, personally viewed and interpreted this ECG.   Date: 09/11/2021  EKG Time: 9:17  Rate: 68  Rhythm: normal sinus rhythm  Axis: Normal  Intervals:none  ST&T Change: None    RADIOLOGY  CT of head reviewed by me with no obvious hemorrhage or midline shift, no acute findings per radiology.   PROCEDURES:  Critical Care performed: Yes, see critical care procedure note(s)  .Critical Care Performed by: Larinda Buttery,  Leonette Mostharles, MD Authorized by: Chesley NoonJessup, Lennox Leikam, MD   Critical care provider statement:    Critical care time (minutes):  45   Critical care time was exclusive of:  Separately billable procedures and treating other patients and teaching time   Critical care was necessary to treat or prevent imminent or life-threatening deterioration of the following conditions:  CNS failure or compromise   Critical care was time spent personally by me on the following activities:  Development of treatment plan with patient or surrogate, discussions with consultants, evaluation of patient's response to treatment, examination of patient, ordering and review of laboratory studies, ordering and review of radiographic studies, ordering and performing treatments and interventions, pulse oximetry, re-evaluation of patient's condition and review of old charts   I assumed direction of critical care for this patient from another provider in my specialty: no      Care discussed with: admitting provider   .1-3 Lead EKG Interpretation Performed by: Chesley NoonJessup, Georgene Kopper, MD Authorized by: Chesley NoonJessup, Jamar Weatherall, MD     Interpretation: normal     ECG rate:  50-60   ECG rate assessment: bradycardic     Rhythm: sinus rhythm     Ectopy: none     Conduction: normal     MEDICATIONS ORDERED IN ED: Medications  labetalol (NORMODYNE) injection 20 mg (has no administration in time range)  sodium chloride flush (NS) 0.9 % injection 3 mL (3 mLs Intravenous Given 09/11/21 0852)  tenecteplase (TNKASE) injection for Stroke 16 mg (16 mg Intravenous Given 09/11/21 0902)  iohexol (OMNIPAQUE) 350 MG/ML injection 75 mL (75 mLs Intravenous Contrast Given 09/11/21 0909)     IMPRESSION / MDM / ASSESSMENT AND PLAN / ED COURSE  I reviewed the triage vital signs and the nursing notes.                              76 year old female with past medical history of hypertension, hyperlipidemia, stroke, cognitive deficit with memory impairment who presents to the ED for acute onset left-sided weakness noted around 8 AM this morning.  Differential diagnosis includes, but is not limited to, stroke, electrolyte abnormality, UTI, Todd's paralysis, hypoglycemia, conversion disorder.  The patient is on the cardiac monitor to evaluate for evidence of arrhythmia and/or significant heart rate changes.  Code stroke was called immediately upon arrival given acute onset left-sided weakness, patient noted to have significant decree strength in the left side but no facial droop, visual deficit, or neglect.  CT head reviewed by me and shows no obvious hemorrhage or midline shift, negative for acute process per radiology.  Patient seen in consultation by Dr. Amada JupiterKirkpatrick of neurology, after further discussion, we will proceed with TNK given no contraindications.  Plan to further evaluate with CTA of head and neck, labs are also pending at this time.  Patient seen by neurology in the past, chart reviewed and she  had been dealing with significant memory deficits but no stroke related symptoms.  CTA of head and neck is negative for acute process patient has been given TNK, states that symptoms affecting left leg are improving but remains weak in left arm.  Labs are unremarkable, no anemia or electrolyte abnormality noted.  UA is pending but low suspicion for UTI at this time.  Case discussed with Dr. Karna ChristmasAleskerov in the ICU for admission.      FINAL CLINICAL IMPRESSION(S) / ED DIAGNOSES   Final diagnoses:  Cerebrovascular accident (CVA), unspecified mechanism (HCC)  Left-sided weakness     Rx / DC Orders   ED Discharge Orders     None        Note:  This document was prepared using Dragon voice recognition software and may include unintentional dictation errors.    Chesley Noon, MD 09/11/21 1016

## 2021-09-11 NOTE — Progress Notes (Signed)
CODE STROKE- PHARMACY COMMUNICATION   Time CODE STROKE called/page received: 0845  Time response to CODE STROKE was made (in person or via phone): Immediately  Time Stroke Kit retrieved from Pyxis: TNK given at 0902 after green light from neurologist and consent obtained from patient  Name of Provider/Nurse contacted: Dr. Rickard Patience 09/11/2021  9:24 AM

## 2021-09-11 NOTE — Plan of Care (Signed)
Patient arrived from ED post TNK at 0900 no deficits noted.  Alert and oriented VSS.

## 2021-09-11 NOTE — H&P (Signed)
NAME:  Kathy Baldwin, MRN:  [id number], DOB:  Mar 20, 1946, LOS: 0 ADMISSION DATE:  09/11/2021, CONSULTATION DATE:  09/11/2020 REFERRING MD:  ED, CHIEF COMPLAINT:  Left sided weakness   History of Present Illness:  76yo female w/ a past medical history of hypertension, hyperlipidemia and memory loss who presented to the ED with left arm weakness and a code stroke was initiated.   Per the medical record, the patient reported she was in usual state of health when she got up this morning around 0700. She was having her morning coffee when her left suddenly went flaccid and then her left leg became weak. On arrival to the ED, the patient had an NIHSS 6. CT code stroke showed no hemorrhage or LVO. The patient did receive TNK. Critical care was then consulted for admission to the ICU.  Patient seen and examined in the ED. She is alert and oriented x3. Left arm and leg weakness. Left arm antigravity w 4/5 strenght, weak grip. Left leg with improvement and anti-gravity. Stronger than arm. No facial droop. Denies any blurry or double vision. Vital signs are stable. She denies any complaints other then her left arm weakness.   Chart reviewed. Significant lab values include: Cr 1.07 otherwise unremarkable.   Pertinent  Medical History  Hypertension Hyperlipidemia OSA on CPAP Vitamin D Deficiency Arthritis Memory Loss  Significant Hospital Events: Including procedures, antibiotic start and stop dates in addition to other pertinent events   1/3 Code Stroke s/p TNK and admitted to ICU  Interim History / Subjective:  Code stroke with Left sided hemiparesis s/p TNK  Objective   Blood pressure (!) 164/64, pulse 60, temperature 98.5 F (36.9 C), resp. rate 20, weight 64 kg, SpO2 94 %.        Intake/Output Summary (Last 24 hours) at 09/11/2021 1347 Last data filed at 09/11/2021 1015 Gross per 24 hour  Intake --  Output 750 ml  Net -750 ml   Filed Weights   09/11/21 0858  Weight: 64 kg     Examination: General: Alert and oriented x3, in no distress HENT: Pupils equal and reactive, normocephalic Lungs: Clear throughout and equal Cardiovascular: Heart sounds S1S2, palpable pulses Abdomen: Soft, non-tender, non-distended Extremities: no deformities, no rashes Neuro: Alert oriented x3, Left sided weakness 4/5, left grip 2/5  Resolved Hospital Problem list   NA  Assessment & Plan:   Acute Ischemic Stroke S/P TNK --Etiology unclear, no LVO --Neurology following --Post TPA protocol --Frequent neuro checks --SBP <180 --Stroke workup (Lipid profile, HgbA1c, ECHO) --Repeat CTH 24hr post TNK --MRI pending --PT/OT to evaluate  Hypertension Hyperlipidemia --Goal SBP <180 --Hold home medications for now --PRN hydralazine/Labetalol  Memory Loss --Hold home medications: Aricept and Namenda   Best Practice (right click and "Reselect all SmartList Selections" daily)   Diet/type: NPO DVT prophylaxis: SCD GI prophylaxis: N/A Lines: N/A Foley:  N/A Code Status:  full code Last date of multidisciplinary goals of care discussion [NA]  Labs   CBC: Recent Labs  Lab 09/11/21 0857  WBC 6.5  NEUTROABS 2.9  HGB 13.1  HCT 40.0  MCV 96.4  PLT 223    Basic Metabolic Panel: Recent Labs  Lab 09/11/21 0857  NA 142  K 3.8  CL 110  CO2 27  GLUCOSE 98  BUN 21  CREATININE 1.07*  CALCIUM 9.4   GFR: Estimated Creatinine Clearance: 38.7 mL/min (A) (by C-G formula based on SCr of 1.07 mg/dL (H)). Recent Labs  Lab 09/11/21 [id number]  WBC 6.5    Liver Function Tests: Recent Labs  Lab 09/11/21 0857  AST 36  ALT 20  ALKPHOS 80  BILITOT 1.0  PROT 7.5  ALBUMIN 4.3   No results for input(s): LIPASE, AMYLASE in the last 168 hours. No results for input(s): AMMONIA in the last 168 hours.  ABG No results found for: PHART, PCO2ART, PO2ART, HCO3, TCO2, ACIDBASEDEF, O2SAT   Coagulation Profile: Recent Labs  Lab 09/11/21 0857  INR 1.1    Cardiac  Enzymes: No results for input(s): CKTOTAL, CKMB, CKMBINDEX, TROPONINI in the last 168 hours.  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 2014), Sleep apnea, Stroke [facility]) (09/11/2021), Thyroid nodule, Vertigo, and Vitamin D deficiency disease.   Surgical History:   Past Surgical History:  Procedure Laterality Date   BREAST CYST ASPIRATION Right    negative   CARPAL TUNNEL RELEASE Right 2015   CATARACT EXTRACTION W/PHACO Left 09/30/2018   Procedure: CATARACT EXTRACTION PHACO AND INTRAOCULAR LENS PLACEMENT ([place])  LEFT;  Surgeon: Leandrew Koyanagi, MD;  Location: [place];  Service: Ophthalmology;  Laterality: Left;  sleep apnea   CATARACT EXTRACTION W/PHACO Right 10/28/2018   Procedure: CATARACT EXTRACTION PHACO AND INTRAOCULAR LENS PLACEMENT ([place])  RIGHT;  Surgeon: Leandrew Koyanagi, MD;  Location: [place];  Service: Ophthalmology;  Laterality: Right;   CHOLECYSTECTOMY  1990   COLONOSCOPY  04/25/2008   CYST EXCISION Left 2020   L popliteal/peroneal cyst excision s/p residual numbness   DILATION AND CURETTAGE OF UTERUS  1982   s/p miscarriage   ESOPHAGOGASTRODUODENOSCOPY  08/2020   chronic gastritis, neg H pylori, no Barretts, no rpt needed [facility])     Social History:   reports that she has never smoked. She has never  used smokeless tobacco. She reports that she does not currently use alcohol. She reports that she does not use drugs.   Family History:  Her family history includes Arthritis in her mother; Asthma in her daughter; Cancer in her brother; Diabetes in her maternal grandmother and sister; Emphysema in her sister; Healthy in her mother, sister, and sister; Heart disease in her father and sister; Hyperlipidemia in her father; Hypertension in her father and maternal grandmother; Lung disease in her father; Stroke in her maternal grandmother; Thyroid disease in her sister. There is no history of Breast cancer.   Allergies Allergies  Allergen Reactions   Cabbage Other (See Comments)    Hypersensitivity on allergy testing   Chocolate Other (See Comments)    Hypersensitivity on allergy testing   Statins Other (See Comments)    Zocor and Pravastatin--leg cramps Other reaction(s): Other (See Comments) Zocor and Pravastatin--leg cramps     Home Medications  Prior to Admission medications   Medication Sig Start Date End Date Taking? Authorizing Provider  amLODipine (NORVASC) 5 MG tablet Take 5 mg by mouth daily. 06/21/21   [provider]  aspirin EC 325 MG tablet Take 1 tablet (  325 mg total) by mouth daily. 10/08/18   Arnetha Courser, MD  Calcium Carb-Cholecalciferol (CALCIUM 600+D) 600-800 MG-UNIT TABS Take by mouth daily. 2 capsule daily    [provider]  COLLAGEN PO Take by mouth.    [provider]  conjugated estrogens (PREMARIN) vaginal cream Use a pea-sized amount externally and internally twice a week at night 10/19/20   Ria Bush, MD  Cyanocobalamin (B-12) 2500 MCG TABS Take 1 tablet by mouth daily. Pt taking 1/2 of a 5000 mcg tablet    [provider]  donepezil (ARICEPT) 10 MG tablet Take 10 mg by mouth at bedtime.  02/16/19   [provider]  famotidine (PEPCID) 20 MG tablet Take 1 tablet (20 mg total) by mouth 2 (two) times daily as needed  for heartburn or indigestion. 03/05/21   Ria Bush, MD  FIBER PO Take by mouth.    [provider]  fluticasone (FLONASE) 50 MCG/ACT nasal spray SHAKE LIQUID AND USE 2 SPRAYS IN [facility] NOSTRIL DAILY 03/19/21   Ria Bush, MD  meclizine (ANTIVERT) 25 MG tablet Take 25 mg by mouth 3 (three) times daily as needed for dizziness.    [provider]  memantine (NAMENDA) 10 MG tablet Take 10 mg by mouth 2 (two) times daily. 03/29/20   [provider]  mirabegron ER (MYRBETRIQ) 25 MG TB24 tablet Take 1 tablet (25 mg total) by mouth daily. 03/22/21   Ria Bush, MD  Multiple Vitamin (MULTIVITAMIN) tablet Take 1 tablet by mouth daily.    [provider]  Multiple Vitamins-Minerals (HAIR/SKIN/NAILS/BIOTIN PO) Take 1 tablet by mouth daily.    [provider]  naproxen (NAPROSYN) 375 MG tablet Take 1 tablet (375 mg total) by mouth 2 (two) times daily with a meal. 03/14/21   Ria Bush, MD  omeprazole (PRILOSEC) 40 MG capsule Take 1 capsule (40 mg total) by mouth daily. For 3 weeks then as needed 03/05/21   Ria Bush, MD  PEDIASURE/FIBER (PEDIASURE/FIBER) LIQD Take 10 mLs by mouth.    [provider]  polyethylene glycol (MIRALAX / GLYCOLAX) packet Take 17 g by mouth daily.    [provider]  rosuvastatin (CRESTOR) 10 MG tablet TAKE 1 TABLET(10 MG) BY MOUTH AT BEDTIME 03/05/21   Ria Bush, MD     Critical care time: 45 minutes       Tonye Royalty ACNP-BC

## 2021-09-11 NOTE — ED Notes (Signed)
Pt reports left sided weakness that started after 8. Woke up feeling fine.

## 2021-09-11 NOTE — ED Notes (Signed)
Report called to ICU

## 2021-09-11 NOTE — Consult Note (Signed)
PHARMACY CONSULT NOTE - FOLLOW UP  Pharmacy Consult for Electrolyte Monitoring and Replacement   Recent Labs: Potassium (mmol/L)  Date Value  09/11/2021 3.8   Calcium (mg/dL)  Date Value  45/62/5638 9.4   Albumin (g/dL)  Date Value  93/73/4287 4.3  12/04/2015 4.3   Sodium (mmol/L)  Date Value  09/11/2021 142  12/04/2015 143     Assessment: Pharmacy has been consulted to monitor electrolytes in 76yo who presented to the ED with left arm weakness and diagnosed with a stroke. Patient received TNK in ED on 09/11/2020.  No IVF currently  Goal of Therapy:  Electrolytes WNL  Plan:  --No replenishment currently needed. --Will continue to monitor electrolytes with morning labs  Bettey Costa ,PharmD Clinical Pharmacist 09/11/2021 2:13 PM

## 2021-09-11 NOTE — ED Triage Notes (Signed)
Pt states she woke up feeling fine this morning and had sudden onset of left arm and leg weakness that started around 0730, pt is a/ox4 on arrival with no noted verbal deficit, husband is present

## 2021-09-11 NOTE — ED Notes (Signed)
Neuro to bedside with pt

## 2021-09-11 NOTE — ED Notes (Signed)
Pt ot CT  

## 2021-09-11 NOTE — Consult Note (Addendum)
Neurology Consultation  CC: Left-sided weakness  History is obtained from: Patient  HPI: Kathy Baldwin is a 76 y.o. female with a history of hypertension, hyperlipidemia who presents with left-sided weakness.  She was in her normal state of health this morning, moving around normally.  Sometime shortly after eight, she noticed that the left side  Of note, she has had intermittent on and off symptoms of tingling and numbness in the ulnar aspect of her left hand and arm that has been coming and going for the past 6 to 8 months.  She has noticed this over the past week as well, but no symptoms of it at all prior to onset of her more dramatic symptoms.  She states that there was no tingling this morning, just numbness.  It has never involved her face or her leg before.   LKW: 8 AM tpa given?: yes IR Thrombectomy? No, no LVO Modified Rankin Scale: 0-Completely asymptomatic and back to baseline post- stroke Neurology arrived 0847 NIHSS: 6   ROS: A complete ROS was performed and is negative except as noted in the HPI.   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 Sabino Gasser 2010   DNR no code (do not resuscitate) 03/20/2017   GERD (gastroesophageal reflux disease)    Hearing loss    History of gallstones    Hyperlipidemia    Hypertension    Hypertrophy of nasal turbinates    Leg mass, left    in calf muscle seeing surgeon tomorrow regarding   Lymphocytosis    Menopause age 42   Multiple food allergies    chocolate and cabbage   OSA on CPAP    Osteopenia Oct. 2014   Sleep apnea    Thyroid nodule    Vertigo    Vitamin D deficiency disease      Family History  Problem Relation Age of Onset   Healthy Mother    Arthritis Mother     Lung disease Father        black lung   Heart disease Father    Hyperlipidemia Father    Hypertension Father    Emphysema Sister    Healthy Sister    Cancer Brother        unsure of type   Asthma Daughter    Heart disease Sister    Thyroid disease Sister    Stroke Maternal Grandmother    Hypertension Maternal Grandmother    Diabetes Maternal Grandmother    Diabetes Sister    Healthy Sister    Breast cancer Neg Hx      Social History:  reports that she has never smoked. She has never used smokeless tobacco. She reports that she does not currently use alcohol. She reports that she does not use drugs.   Prior to Admission medications   Medication Sig Start Date End Date Taking? Authorizing Provider  amLODipine (NORVASC) 5 MG tablet Take 5 mg by mouth daily. 06/21/21   [provider]  aspirin EC 325 MG tablet Take 1 tablet (325 mg total) by mouth daily. 10/08/18   Arnetha Courser, MD  Calcium Carb-Cholecalciferol (CALCIUM 600+D) 600-800 MG-UNIT TABS Take by mouth daily. 2 capsule daily  [provider]  COLLAGEN PO Take by mouth.    [provider]  conjugated estrogens (PREMARIN) vaginal cream Use a pea-sized amount externally and internally twice a week at night 10/19/20   Ria Bush, MD  Cyanocobalamin (B-12) 2500 MCG TABS Take 1 tablet by mouth daily. Pt taking 1/2 of a 5000 mcg tablet    [provider]  donepezil (ARICEPT) 10 MG tablet Take 10 mg by mouth at bedtime.  02/16/19   [provider]  famotidine (PEPCID) 20 MG tablet Take 1 tablet (20 mg total) by mouth 2 (two) times daily as needed for heartburn or indigestion. 03/05/21   Ria Bush, MD  FIBER PO Take by mouth.    [provider]  fluticasone (FLONASE) 50 MCG/ACT nasal spray SHAKE LIQUID AND USE 2 SPRAYS IN [facility] NOSTRIL DAILY 03/19/21   Ria Bush, MD  meclizine (ANTIVERT) 25 MG tablet Take 25 mg by mouth 3 (three) times daily as needed for  dizziness.    [provider]  memantine (NAMENDA) 10 MG tablet Take 10 mg by mouth 2 (two) times daily. 03/29/20   [provider]  mirabegron ER (MYRBETRIQ) 25 MG TB24 tablet Take 1 tablet (25 mg total) by mouth daily. 03/22/21   Ria Bush, MD  Multiple Vitamin (MULTIVITAMIN) tablet Take 1 tablet by mouth daily.    [provider]  Multiple Vitamins-Minerals (HAIR/SKIN/NAILS/BIOTIN PO) Take 1 tablet by mouth daily.    [provider]  naproxen (NAPROSYN) 375 MG tablet Take 1 tablet (375 mg total) by mouth 2 (two) times daily with a meal. 03/14/21   Ria Bush, MD  omeprazole (PRILOSEC) 40 MG capsule Take 1 capsule (40 mg total) by mouth daily. For 3 weeks then as needed 03/05/21   Ria Bush, MD  PEDIASURE/FIBER (PEDIASURE/FIBER) LIQD Take 10 mLs by mouth.    [provider]  polyethylene glycol (MIRALAX / GLYCOLAX) packet Take 17 g by mouth daily.    [provider]  rosuvastatin (CRESTOR) 10 MG tablet TAKE 1 TABLET(10 MG) BY MOUTH AT BEDTIME 03/05/21   Ria Bush, MD     Exam: Current vital signs: BP (!) 148/87    Pulse 73    Temp 98.5 F (36.9 C)    Resp 20    Wt 64 kg    SpO2 99%    BMI 25.79 kg/m    Physical Exam  Constitutional: Appears well-developed and well-nourished.  Psych: Affect appropriate to situation Eyes: No scleral injection HENT: No OP obstruction Head: Normocephalic.  Cardiovascular: Normal rate and regular rhythm.  Respiratory: Effort normal and breath sounds normal to anterior ascultation GI: Soft.  No distension. There is no tenderness.  Skin: WDI  Neuro: Mental Status: Patient is awake, alert, oriented to person, place, age, month Patient is able to give a clear and coherent history. No signs of aphasia or neglect Cranial Nerves: II: Visual Fields are full. Pupils are equal, round, and reactive to light.   III,IV, VI: EOMI without ptosis or diploplia.  V: Facial sensation is  symmetric to temperature VII: Facial movement is symmetric.  VIII: hearing is intact to voice X: Uvula elevates symmetrically XI: Shoulder shrug is symmetric. XII: tongue is midline without atrophy or fasciculations.  Motor: Tone is normal. Bulk is normal. 5/5 strength was present on the right. She has 3/5 weakness on the left, unable to [facility] either leg or arm against gravity Sensory: Sensation is diminished on the left Cerebellar: FNF and HKS are  intact on the right, consistent with weakness on the left.    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   Secondary Diagnosis: Essential (primary) hypertension Dementia   Impression: 76 yo F with acute onset of left sided weakness and numbness.  Though she has had chronic ulnar aspect numbness coming and going for several months, this is different per patient and husband then the symptoms that started acutely this morning.  Also, she had no symptoms, including her chronic symptoms this morning prior to onset shortly after 8 AM.  I therefore do not feel that this is a contraindication to thrombolytic therapy.  I discussed the risks and benefits of IV thrombolytic therapy with the patient and husband who agree with proceeding.  Plan: -Admit to ICU - HgbA1c, fasting lipid panel - MRI of the brain without contrast - Frequent neuro checks - Echocardiogram - Prophylactic therapy-none for 24 hours - Risk factor modification - Telemetry monitoring - PT consult, OT consult, Speech consult    This patient is critically ill and at significant risk of neurological worsening, death and care requires constant monitoring of vital signs, hemodynamics,respiratory and cardiac monitoring, neurological assessment, discussion with  family, other specialists and medical decision making of high complexity. I spent 50 minutes of neurocritical care time  in the care of  this patient. This was time spent independent of any time provided by nurse practitioner or PA.  Roland Rack, MD [facility] [phone]  If 7pm- 7am, please page neurology on call as listed in [place].

## 2021-09-11 NOTE — Progress Notes (Signed)
°  Chaplain On-Call responded to Code Stroke notification.  ED Staff stated that the patient is alert and has been taken for CT scan procedure.  Chaplain assured Staff of availability for further support as needed.  Chaplain Evelena Peat M.Div., [facility]

## 2021-09-12 ENCOUNTER — Telehealth: Payer: Self-pay | Admitting: Family Medicine

## 2021-09-12 ENCOUNTER — Inpatient Hospital Stay: Payer: PPO

## 2021-09-12 ENCOUNTER — Inpatient Hospital Stay (HOSPITAL_COMMUNITY)
Admit: 2021-09-12 | Discharge: 2021-09-12 | Disposition: A | Discharge status: Home / Self Care | Payer: PPO | Attending: Nurse Practitioner | Admitting: Nurse Practitioner

## 2021-09-12 DIAGNOSIS — I6389 Other cerebral infarction: Secondary | ICD-10-CM | POA: Diagnosis not present

## 2021-09-12 DIAGNOSIS — R531 Weakness: Secondary | ICD-10-CM

## 2021-09-12 LAB — ECHOCARDIOGRAM COMPLETE
AR max vel: 1.74 cm2
AV Area VTI: 1.92 cm2
AV Area mean vel: 1.88 cm2
AV Mean grad: 4 mmHg
AV Peak grad: 8.9 mmHg
Ao pk vel: 1.49 m/s
Area-P 1/2: 2.67 cm2
Height: 63 in
MV VTI: 2.07 cm2
S' Lateral: 2.71 cm
Weight: 2306.89 oz

## 2021-09-12 LAB — CBC
HCT: 40.6 % (ref 36.0–46.0)
Hemoglobin: 13.2 g/dL (ref 12.0–15.0)
MCH: 30.6 pg (ref 26.0–34.0)
MCHC: 32.5 g/dL (ref 30.0–36.0)
MCV: 94 fL (ref 80.0–100.0)
Platelets: 216 10*3/uL (ref 150–400)
RBC: 4.32 MIL/uL (ref 3.87–5.11)
RDW: 12.8 % (ref 11.5–15.5)
WBC: 6.1 10*3/uL (ref 4.0–10.5)
nRBC: 0 % (ref 0.0–0.2)

## 2021-09-12 LAB — PHOSPHORUS: Phosphorus: 3.6 mg/dL (ref 2.5–4.6)

## 2021-09-12 LAB — MAGNESIUM: Magnesium: 2.3 mg/dL (ref 1.7–2.4)

## 2021-09-12 LAB — BASIC METABOLIC PANEL
Anion gap: 5 (ref 5–15)
BUN: 17 mg/dL (ref 8–23)
CO2: 28 mmol/L (ref 22–32)
Calcium: 9.3 mg/dL (ref 8.9–10.3)
Chloride: 107 mmol/L (ref 98–111)
Creatinine, Ser: 0.72 mg/dL (ref 0.44–1.00)
GFR, Estimated: >60 mL/min (ref >60)
Glucose, Bld: 97 mg/dL (ref 70–99)
Potassium: 3.6 mmol/L (ref 3.5–5.1)
Sodium: 140 mmol/L (ref 135–145)

## 2021-09-12 MED ORDER — CLOPIDOGREL BISULFATE 75 MG PO TABS
300.0000 mg | ORAL_TABLET | Freq: Once | ORAL | Status: AC
  Administered 2021-09-12: 300 mg via ORAL
  Filled 2021-09-12: qty 4

## 2021-09-12 MED ORDER — ACETAMINOPHEN 325 MG PO TABS
650.0000 mg | ORAL_TABLET | Freq: Four times a day (QID) | ORAL | Status: DC | PRN
  Administered 2021-09-12: 650 mg via ORAL
  Filled 2021-09-12: qty 2

## 2021-09-12 MED ORDER — ROSUVASTATIN CALCIUM 10 MG PO TABS
10.0000 mg | ORAL_TABLET | Freq: Every day | ORAL | Status: DC
  Administered 2021-09-12: 10 mg via ORAL
  Filled 2021-09-12: qty 1

## 2021-09-12 MED ORDER — MEMANTINE HCL 5 MG PO TABS
10.0000 mg | ORAL_TABLET | Freq: Two times a day (BID) | ORAL | Status: DC
  Administered 2021-09-12 – 2021-09-13 (×2): 10 mg via ORAL
  Filled 2021-09-12 (×2): qty 2
  Filled 2021-09-12: qty 1

## 2021-09-12 MED ORDER — ASPIRIN EC 81 MG PO TBEC
81.0000 mg | DELAYED_RELEASE_TABLET | Freq: Every day | ORAL | Status: DC
  Administered 2021-09-12 – 2021-09-13 (×2): 81 mg via ORAL
  Filled 2021-09-12 (×2): qty 1

## 2021-09-12 MED ORDER — DONEPEZIL HCL 5 MG PO TABS
10.0000 mg | ORAL_TABLET | Freq: Every day | ORAL | Status: DC
  Administered 2021-09-12: 10 mg via ORAL
  Filled 2021-09-12 (×2): qty 2

## 2021-09-12 MED ORDER — CLOPIDOGREL BISULFATE 75 MG PO TABS
75.0000 mg | ORAL_TABLET | Freq: Every day | ORAL | Status: DC
  Administered 2021-09-13: 10:00:00 75 mg via ORAL
  Filled 2021-09-12: qty 1

## 2021-09-12 MED ORDER — MIRABEGRON ER 25 MG PO TB24
25.0000 mg | ORAL_TABLET | Freq: Every day | ORAL | Status: DC
  Administered 2021-09-13: 25 mg via ORAL
  Filled 2021-09-12: qty 1

## 2021-09-12 NOTE — Progress Notes (Addendum)
SLP Cancellation Note  Patient Details Name: Kathy Baldwin MRN: [id number] DOB: [birth year]   Cancelled treatment:       Reason Eval/Treat Not Completed: SLP screened, no needs identified, will sign off (chart reviewed; consulted NSG then met w/ pt/Husband).  Pt denied any difficulty swallowing and is currently on a regular diet; tolerates swallowing pills w/ water per NSG. Pt conversed in conversation w/out expressive/receptive deficits noted; pt denied any speech-language deficits. Speech clear. Pt was brushing her teeth, clearing mouth appropriately. Husband present denied any deficits but did state that pt "sees a Garment/textile technologist for her memory at home". Recommended pt/he f/u w/ Neurology if any concerns w/ Cognition post D/C home; they agreed. Pt did c/o some L LE weakness.  No further skilled ST services indicated as pt appears at her baseline. Pt agreed. NSG to reconsult if any change in status while admitted.       Orinda Kenner, MS, CCC-SLP Speech Language Pathologist [facility] [phone] [facility] 09/12/2021, 11:36 AM

## 2021-09-12 NOTE — Progress Notes (Signed)
Subjective: She improved to being completely back to normal about an hour after tenecteplase.  Exam: Vitals:   09/12/21 0800 09/12/21 0900  BP: (!) 155/66 137/65  Pulse: 75 (!) 58  Resp: (!) 22 (!) 21  Temp: 97.9 F (36.6 C)   SpO2: 92% 96%   Gen: In bed, NAD Resp: non-labored breathing, no acute distress Abd: soft, nt  Neuro: MS: Awake, alert, interactive and appropriate, oriented and appropriate. CN: Visual fields full, EOMI, face symmetric Motor: 5/5 throughout Sensory: Intact to light touch  Pertinent Labs: LDL 66 No significant stenosis on CTA MRI brain negative for acute stroke(though I think there may be a questionable area on series #5, image 10 in the left cerebellum, I agree with radiology it is not definite enough to merit considering it positive). Certainly she has old cerebellar strokes.   Impression: 76 year old female with transient left-sided weakness most consistent with TIA/stroke aborted with thrombolytic therapy.  Given the lack of cortical findings, my suspicion is that this was a small subcortical infarct.  I would favor dual antiplatelet therapy x3 weeks, with -24-hour CT scan I will go ahead and order this.  Recommendations: 1) aspirin 81 mg daily and Plavix 75 mg daily after 300 mg load, continue aspirin for 3 weeks and then Plavix monotherapy 2) continue Crestor 10 mg daily given her LDL is at goal 3) echo 4) PT/OT/St 5) If echo is negative and no therapy needs identified, could discharge her this afternoon.   Ritta Slot, MD [facility] [phone]  If 7pm- 7am, please page neurology on call as listed in [place].

## 2021-09-12 NOTE — Telephone Encounter (Addendum)
Noted. I've been following along with hospital records.

## 2021-09-12 NOTE — Evaluation (Signed)
Physical Therapy Evaluation Patient Details Name: Kathy Baldwin MRN: [id number] DOB: 10-Aug-1946 Today's Date: 09/12/2021  History of Present Illness  Pt is a 76yo female who presented to the [facility] ED with chief complaints of L arm weakness. Code stroke was initiated and pt recieved TNK at 0902 on 09/11/76. Imaging negative for acute abnormality. PMH includes: HTN, HLD, OSA (on CPAP), arthritis, and memory loss.   Clinical Impression  Pt A&Ox4, on BSC at start of session. Did report 2/10 pain in L calf (chronic pain). Able to perform pericare independently, transfer from [facility] to bed, to recliner independently. She ambulated ~156ft total with supervision, trialed RW. Some improvement in cadence and steadiness noted, but RW not required at this time. Pt also was able to perform stair navigation with rail and supervision, no difficulties noted. LE MMT WFLs, pt reported feeling near return to baseline. The patient demonstrated and reported return to baseline level of functioning, no further acute PT needs indicated. PT to sign off. Please reconsult PT if pt status changes or acute needs are identified.         Recommendations for follow up therapy are one component of a multi-disciplinary discharge planning process, led by the attending physician.  Recommendations may be updated based on patient status, additional functional criteria and insurance authorization.  Follow Up Recommendations No PT follow up    Assistance Recommended at Discharge PRN  Patient can return home with the following       Equipment Recommendations None recommended by PT  Recommendations for Other Services       Functional Status Assessment Patient has not had a recent decline in their functional status     Precautions / Restrictions Precautions Precautions: None;Fall Restrictions Weight Bearing Restrictions: No      Mobility  Bed Mobility Overal bed mobility: Independent             General bed  mobility comments: HOB elevated for sit>sup t/f.    Transfers Overall transfer level: Independent Equipment used: None;Rolling walker (2 wheels)               General transfer comment: able to perform with and without RW    Ambulation/Gait   Gait Distance (Feet): 100 Feet Assistive device: Rolling walker (2 wheels);None         General Gait Details: some improved velocity and steadiness with RW, but also able to ambulate without  Stairs Stairs: Yes Stairs assistance: Modified independent (Device/Increase time) Stair Management: One rail Left;Alternating pattern Number of Stairs: 12    Wheelchair Mobility    Modified Rankin (Stroke Patients Only)       Balance Overall balance assessment: Mild deficits observed, not formally tested                                           Pertinent Vitals/Pain Pain Assessment: 0-10 Pain Score: 2  Pain Location: L calf pain Pain Descriptors / Indicators: Sore Pain Intervention(s): Limited activity within patient's tolerance;Monitored during session    Home Living Family/patient expects to be discharged to:: Private residence Living Arrangements: Spouse/significant other Available Help at Discharge: Available 24 hours/day Type of Home: House Home Access: Stairs to enter Entrance Stairs-Rails: None Entrance Stairs-Number of Steps: 2 Alternate Level Stairs-Number of Steps: Flight of steps to basement/living area. Bedroom, bathroom, and kitchen on main level. Home Layout: Two level Home Equipment: None  Additional Comments: May have BSC in storage.    Prior Function Prior Level of Function : Independent/Modified Independent             Mobility Comments: Pt reports she is active and independent at baseline. She endorses driving, community mobility. Denies falls history in past 6 months. ADLs Comments: Does not require assist for ADL/IADL management. Enjoys yardwork, and caring for her dog.      Hand Dominance   Dominant Hand: Right    Extremity/Trunk Assessment   Upper Extremity Assessment Upper Extremity Assessment: Defer to OT evaluation    Lower Extremity Assessment Lower Extremity Assessment: Overall WFL for tasks assessed    Cervical / Trunk Assessment Cervical / Trunk Assessment: Normal  Communication   Communication: No difficulties  Cognition Arousal/Alertness: Awake/alert Behavior During Therapy: WFL for tasks assessed/performed Overall Cognitive Status: Within Functional Limits for tasks assessed                                 General Comments: A&O x4, pleasant, conversational.        General Comments      Exercises Other Exercises Other Exercises: Pt on BSC at start of session, modI for pericare, and able to transfer to bed, to opposite side of bed, and to recliner without deficits   Assessment/Plan    PT Assessment Patient does not need any further PT services  PT Problem List         PT Treatment Interventions      PT Goals (Current goals can be found in the Care Plan section)       Frequency       Co-evaluation               AM-PAC PT "6 Clicks" Mobility  Outcome Measure Help needed turning from your back to your side while in a flat bed without using bedrails?: None Help needed moving from lying on your back to sitting on the side of a flat bed without using bedrails?: None Help needed moving to and from a bed to a chair (including a wheelchair)?: None Help needed standing up from a chair using your arms (e.g., wheelchair or bedside chair)?: None Help needed to walk in hospital room?: None Help needed climbing 3-5 steps with a railing? : None 6 Click Score: 24    End of Session Equipment Utilized During Treatment: Gait belt Activity Tolerance: Patient tolerated treatment well Patient left: in chair;with call bell/phone within reach;with family/visitor present Nurse Communication: Mobility status PT  Visit Diagnosis: Other abnormalities of gait and mobility (R26.89)    Time: 3716-9678 PT Time Calculation (min) (ACUTE ONLY): 27 min   Charges:   PT Evaluation $PT Eval Low Complexity: 1 Low PT Treatments $Gait Training: 8-22 mins $Therapeutic Activity: 8-22 mins        Olga Coaster PT, DPT 4:22 PM,09/12/21

## 2021-09-12 NOTE — Evaluation (Signed)
Occupational Therapy Evaluation Patient Details Name: Kathy Baldwin MRN: [id number] DOB: 09/26/1945 Today's Date: 09/12/2021   History of Present Illness Pt is a 75yo female who presented to the [facility] ED with chief complaints of L arm weakness. Code stroke was initiated and pt recieved TNK at 0902 on 09/11/20. Imaging negative for acute abnormality. PMH includes: HTN, HLD, OSA (on CPAP), arthritis, and memory loss.   Clinical Impression   Kathy Baldwin was seen for OT evaluation this date. Prior to hospital admission, pt was independent in all aspects of ADL/IADL, and denies falls history in past 12 months. Pt lives with her spouse in a 2 story home with 2 steps to enter with no hand rail. Currently pt reporting symptoms have resolved and that she feels back to baseline level of functional independence. Pt demonstrates baseline independence to perform ADL and mobility tasks and no strength, sensory, coordination, cognitive, or visual deficits appreciated with assessment. No skilled OT needs identified. Will sign off. Please re-consult if additional OT needs arise.       Recommendations for follow up therapy are one component of a multi-disciplinary discharge planning process, led by the attending physician.  Recommendations may be updated based on patient status, additional functional criteria and insurance authorization.   Follow Up Recommendations  No OT follow up    Assistance Recommended at Discharge PRN  Patient can return home with the following      Functional Status Assessment  Patient has not had a recent decline in their functional status  Equipment Recommendations  None recommended by OT    Recommendations for Other Services       Precautions / Restrictions Precautions Precautions: None;Fall Restrictions Weight Bearing Restrictions: No      Mobility Bed Mobility Overal bed mobility: Modified Independent             General bed mobility comments: HOB  elevated for sit>sup t/f.    Transfers Overall transfer level: Independent Equipment used: None               General transfer comment: Pt performs STS/Functional mobility without assist or AE this date. Mild use of bed rails for support during functional mobility appreciated.      Balance Overall balance assessment: No apparent balance deficits (not formally assessed)                                         ADL either performed or assessed with clinical judgement   ADL Overall ADL's : At baseline                                       General ADL Comments: Pt presents at or near baseline level of functional independence to perform ADL management. She reports her only limiting factor at this time are hospital lines and leads in CCU. Pt able to perform toilet t/f and toileting with SBA for mgt of lines and leads.     Vision Baseline Vision/History: 1 Wears glasses Ability to See in Adequate Light: 1 Impaired Patient Visual Report: No change from baseline       Perception     Praxis      Pertinent Vitals/Pain Pain Assessment: No/denies pain     Hand Dominance Right   Extremity/Trunk Assessment Upper Extremity Assessment Upper  Extremity Assessment: Overall WFL for tasks assessed (BUE 4 to 5/5 t/o . Grossly symmetrical. No focal weakness appreciated.)   Lower Extremity Assessment Lower Extremity Assessment: Overall WFL for tasks assessed;Defer to PT evaluation   Cervical / Trunk Assessment Cervical / Trunk Assessment: Normal   Communication Communication Communication: No difficulties   Cognition Arousal/Alertness: Awake/alert Behavior During Therapy: WFL for tasks assessed/performed Overall Cognitive Status: Within Functional Limits for tasks assessed                                 General Comments: A&O x4, pleasant, conversational.     General Comments       Exercises Other Exercises Other Exercises:  Pt educated on role of OT in acute setting, safe use of AE/DME for ADL management, BE FAST stroke recognition and response education, and routines modifications to support safety and functional independence upon DC home.   Shoulder Instructions      Home Living Family/patient expects to be discharged to:: Private residence Living Arrangements: Spouse/significant other Available Help at Discharge: Available 24 hours/day Type of Home: House Home Access: Stairs to enter [employer] of Steps: 2 Entrance Stairs-Rails: None Home Layout: Two level Alternate Level Stairs-Number of Steps: Flight of steps to basement/living area. Bedroom, bathroom, and kitchen on main level.   Bathroom Shower/Tub: Chief Strategy Officer: Standard     Home Equipment: None   Additional Comments: May have BSC in storage.      Prior Functioning/Environment Prior Level of Function : Independent/Modified Independent             Mobility Comments: Pt reports she is active and independent at baseline. She endorses driving, community mobility. Denies falls history in past 6 months. ADLs Comments: Does not require assist for ADL/IADL management. Enjoys yardwork, and caring for her dog.        OT Problem List: Decreased strength;Impaired sensation;Decreased activity tolerance;Impaired balance (sitting and/or standing);Decreased knowledge of use of DME or AE;Impaired UE functional use      OT Treatment/Interventions:      OT Goals(Current goals can be found in the care plan section) Acute Rehab OT Goals Patient Stated Goal: To go home OT Goal Formulation: All assessment and education complete, DC therapy Time For Goal Achievement: 09/12/21  OT Frequency:      Co-evaluation              AM-PAC OT "6 Clicks" Daily Activity     Outcome Measure Help from another person eating meals?: None Help from another person taking care of personal grooming?: None Help from another person  toileting, which includes using toliet, bedpan, or urinal?: None Help from another person bathing (including washing, rinsing, drying)?: None Help from another person to put on and taking off regular upper body clothing?: None Help from another person to put on and taking off regular lower body clothing?: None 6 Click Score: 24   End of Session Equipment Utilized During Treatment: Gait belt  Activity Tolerance: Patient tolerated treatment well Patient left: in bed;with call bell/phone within reach;with bed alarm set  OT Visit Diagnosis: Other abnormalities of gait and mobility (R26.89);Other symptoms and signs involving the nervous system (R29.898)                Time: 8469-6295 OT Time Calculation (min): 23 min Charges:  OT General Charges $OT Visit: 1 Visit OT Evaluation $OT Eval Low Complexity: 1 Low  OT Treatments $Self Care/Home Management : 8-22 mins  Rockney Ghee, M.S., OTR/L [employer] - [facility] Ascom: [phone] 09/12/21, 2:12 PM

## 2021-09-12 NOTE — Telephone Encounter (Signed)
Mr. Lague called in and wanted to let Dr. Reece Agar know that Mrs. Ariadna is in the hospital. She had a minor stroke and went down to a TIA

## 2021-09-12 NOTE — Progress Notes (Signed)
NAME:  Kathy Baldwin, MRN:  [id number], DOB:  [birth year], LOS: 1 ADMISSION DATE:  09/11/2021, CONSULTATION DATE:  09/11/2020 REFERRING MD:  ED, CHIEF COMPLAINT:  Left sided weakness   History of Present Illness:  76yo female w/ a past medical history of hypertension, hyperlipidemia and memory loss who presented to the ED with left arm weakness and a code stroke was initiated.   Per the medical record, the patient reported she was in usual state of health when she got up this morning around 0700. She was having her morning coffee when her left suddenly went flaccid and then her left leg became weak. On arrival to the ED, the patient had an NIHSS 6. CT code stroke showed no hemorrhage or LVO. The patient did receive TNK. Critical care was then consulted for admission to the ICU.  Patient seen and examined in the ED. She is alert and oriented x3. Left arm and leg weakness. Left arm antigravity w 4/5 strenght, weak grip. Left leg with improvement and anti-gravity. Stronger than arm. No facial droop. Denies any blurry or double vision. Vital signs are stable. She denies any complaints other then her left arm weakness.   Chart reviewed. Significant lab values include: Cr 1.07 otherwise unremarkable.   09/12/20- patient improved clinically, will optimize for TRH transfer.    Pertinent  Medical History  Hypertension Hyperlipidemia OSA on CPAP Vitamin D Deficiency Arthritis Memory Loss  Significant Hospital Events: Including procedures, antibiotic start and stop dates in addition to other pertinent events   1/3 Code Stroke s/p TNK and admitted to ICU  Interim History / Subjective:  Code stroke with Left sided hemiparesis s/p TNK  Objective   Blood pressure 137/65, pulse (!) 58, temperature 97.9 F (36.6 C), temperature source Oral, resp. rate (!) 21, height 5\' 3"  (1.6 m), weight 65.4 kg, SpO2 96 %.        Intake/Output Summary (Last 24 hours) at 09/12/2021 1026 Last data filed at  09/12/2021 0900 Gross per 24 hour  Intake 240 ml  Output 1200 ml  Net -960 ml    Filed Weights   09/11/21 0858 09/11/21 1820 09/12/21 0500  Weight: 64 kg 64.5 kg 65.4 kg    Examination: General: Alert and oriented x3, in no distress HENT: Pupils equal and reactive, normocephalic Lungs: Clear throughout and equal Cardiovascular: Heart sounds S1S2, palpable pulses Abdomen: Soft, non-tender, non-distended Extremities: no deformities, no rashes Neuro: Alert oriented x3, Left sided weakness 4/5, left grip 2/5  Resolved Hospital Problem list   NA  Assessment & Plan:   Acute Ischemic Stroke S/P TNK --Etiology unclear, no LVO --Neurology following --Post TPA protocol --Frequent neuro checks --SBP <180 --Stroke workup (Lipid profile, HgbA1c, ECHO) --Repeat CTH 24hr post TNK --MRI pending --PT/OT to evaluate  Hypertension Hyperlipidemia --Goal SBP <180 --Hold home medications for now --PRN hydralazine/Labetalol  Memory Loss --Hold home medications: Aricept and Namenda   Best Practice (right click and "Reselect all SmartList Selections" daily)   Diet/type: NPO DVT prophylaxis: SCD GI prophylaxis: N/A Lines: N/A Foley:  N/A Code Status:  full code Last date of multidisciplinary goals of care discussion [NA]  Labs   CBC: Recent Labs  Lab 09/11/21 0857 09/12/21 0526  WBC 6.5 6.1  NEUTROABS 2.9  --   HGB 13.1 13.2  HCT 40.0 40.6  MCV 96.4 94.0  PLT 223 216     Basic Metabolic Panel: Recent Labs  Lab 09/11/21 0857 09/12/21 0526  NA 142 140  K  3.8 3.6  CL 110 107  CO2 27 28  GLUCOSE 98 97  BUN 21 17  CREATININE 1.07* 0.72  CALCIUM 9.4 9.3  MG  --  2.3  PHOS  --  3.6    GFR: Estimated Creatinine Clearance: 53.6 mL/min (by C-G formula based on SCr of 0.72 mg/dL). Recent Labs  Lab 09/11/21 0857 09/12/21 0526  WBC 6.5 6.1     Liver Function Tests: Recent Labs  Lab 09/11/21 0857  AST 36  ALT 20  ALKPHOS 80  BILITOT 1.0  PROT 7.5   ALBUMIN 4.3    No results for input(s): LIPASE, AMYLASE in the last 168 hours. No results for input(s): AMMONIA in the last 168 hours.  ABG No results found for: PHART, PCO2ART, PO2ART, HCO3, TCO2, ACIDBASEDEF, O2SAT   Coagulation Profile: Recent Labs  Lab 09/11/21 0857  INR 1.1     Cardiac Enzymes: No results for input(s): CKTOTAL, CKMB, CKMBINDEX, TROPONINI in the last 168 hours.  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 2014), Sleep apnea, Stroke [facility]) (09/11/2021), Thyroid nodule, Vertigo, and Vitamin D deficiency disease.   Surgical History:   Past Surgical History:  Procedure Laterality Date   BREAST CYST ASPIRATION Right    negative   CARPAL TUNNEL RELEASE Right 2015   CATARACT EXTRACTION W/PHACO Left 09/30/2018   Procedure: CATARACT EXTRACTION PHACO AND INTRAOCULAR LENS PLACEMENT ([place])  LEFT;  Surgeon: Leandrew Koyanagi, MD;  Location: [place];  Service: Ophthalmology;  Laterality: Left;  sleep apnea   CATARACT EXTRACTION W/PHACO Right 10/28/2018   Procedure: CATARACT EXTRACTION  PHACO AND INTRAOCULAR LENS PLACEMENT ([place])  RIGHT;  Surgeon: Leandrew Koyanagi, MD;  Location: [place];  Service: Ophthalmology;  Laterality: Right;   CHOLECYSTECTOMY  1990   COLONOSCOPY  04/25/2008   CYST EXCISION Left 2020   L popliteal/peroneal cyst excision s/p residual numbness   DILATION AND CURETTAGE OF UTERUS  1982   s/p miscarriage   ESOPHAGOGASTRODUODENOSCOPY  08/2020   chronic gastritis, neg H pylori, no Barretts, no rpt needed [facility])     Social History:   reports that she has never smoked. She has never used smokeless tobacco. She reports that she does not currently use alcohol. She reports that she does not use drugs.   Family History:  Her family history includes Arthritis in her mother; Asthma in her daughter; Cancer in her brother; Diabetes in her maternal grandmother and sister; Emphysema in her sister; Healthy in her mother, sister, and sister; Heart disease in her father and sister;  Hyperlipidemia in her father; Hypertension in her father and maternal grandmother; Lung disease in her father; Stroke in her maternal grandmother; Thyroid disease in her sister. There is no history of Breast cancer.   Allergies Allergies  Allergen Reactions   Cabbage Other (See Comments)    Hypersensitivity on allergy testing   Chocolate Other (See Comments)    Hypersensitivity on allergy testing   Statins Other (See Comments)    Zocor and Pravastatin--leg cramps Other reaction(s): Other (See Comments) Zocor and Pravastatin--leg cramps     Home Medications  Prior to Admission medications   Medication Sig Start Date End Date Taking? Authorizing Provider  amLODipine (NORVASC) 5 MG tablet Take 5 mg by mouth daily. 06/21/21   [provider]  aspirin EC 325 MG tablet Take 1 tablet (325 mg total) by mouth daily. 10/08/18   Arnetha Courser, MD  Calcium Carb-Cholecalciferol (CALCIUM 600+D) 600-800 MG-UNIT TABS Take by mouth daily. 2 capsule daily    [provider]  COLLAGEN PO Take by mouth.    [provider]  conjugated estrogens (PREMARIN) vaginal cream Use a pea-sized amount externally and internally twice a week at night 10/19/20   Ria Bush, MD  Cyanocobalamin (B-12) 2500 MCG TABS Take 1 tablet by mouth daily. Pt taking 1/2 of a 5000 mcg tablet    [provider]  donepezil (ARICEPT) 10 MG tablet Take 10 mg by mouth at bedtime.  02/16/19   [provider]  famotidine (PEPCID) 20 MG tablet Take 1 tablet (20 mg total) by mouth 2 (two) times daily as needed for heartburn or indigestion. 03/05/21   Ria Bush, MD  FIBER PO Take by mouth.    [provider]  fluticasone (FLONASE) 50 MCG/ACT nasal spray SHAKE LIQUID AND USE 2 SPRAYS IN [facility] NOSTRIL DAILY 03/19/21   Ria Bush, MD  meclizine (ANTIVERT) 25 MG tablet Take 25 mg by mouth 3 (three) times daily as needed for dizziness.    [provider]  memantine (NAMENDA) 10 MG tablet Take 10 mg by mouth 2 (two) times daily. 03/29/20   [provider]  mirabegron ER (MYRBETRIQ) 25 MG TB24 tablet Take 1 tablet (25 mg total) by mouth daily. 03/22/21   Ria Bush, MD  Multiple Vitamin (MULTIVITAMIN) tablet Take 1 tablet by mouth daily.    [provider]  Multiple Vitamins-Minerals (HAIR/SKIN/NAILS/BIOTIN PO) Take 1 tablet by mouth daily.    [provider]  naproxen (NAPROSYN) 375 MG tablet Take 1 tablet (375 mg total) by mouth 2 (two) times daily with a meal. 03/14/21   Ria Bush, MD  omeprazole (PRILOSEC) 40 MG capsule Take 1 capsule (40 mg total) by mouth daily. For 3 weeks then as needed 03/05/21   Ria Bush, MD  PEDIASURE/FIBER (PEDIASURE/FIBER) LIQD Take 10 mLs by mouth.    [provider]  polyethylene glycol (MIRALAX / GLYCOLAX) packet Take 17 g by mouth daily.    [provider]  rosuvastatin (CRESTOR) 10 MG tablet TAKE 1 TABLET(10 MG) BY MOUTH AT BEDTIME 03/05/21    Ria Bush, MD     Critical care provider statement:   Total critical care time: 33 minutes   Performed by: [name] MD   Critical care time was exclusive of separately billable procedures and treating other patients.   Critical care was necessary to treat or prevent imminent or life-threatening deterioration.   Critical care was time spent personally by me on the following activities: development of treatment plan  with patient and/or surrogate as well as nursing, discussions with consultants, evaluation of patient's response to treatment, examination of patient, obtaining history from patient or surrogate, ordering and performing treatments and interventions, ordering and review of laboratory studies, ordering and review of radiographic studies, pulse oximetry and re-evaluation of patient's condition.    Ottie Glazier, M.D.  Pulmonary & Critical Care Medicine

## 2021-09-12 NOTE — Progress Notes (Signed)
*  PRELIMINARY RESULTS* Echocardiogram 2D Echocardiogram has been performed.  Joanette Gula Mayo Faulk 09/12/2021, 10:33 AM

## 2021-09-12 NOTE — Consult Note (Signed)
PHARMACY CONSULT NOTE  Pharmacy Consult for Electrolyte Monitoring and Replacement   Recent Labs: Potassium (mmol/L)  Date Value  09/12/2021 3.6   Magnesium (mg/dL)  Date Value  27/11/5007 2.3   Calcium (mg/dL)  Date Value  38/18/2993 9.3   Albumin (g/dL)  Date Value  71/69/6789 4.3  12/04/2015 4.3   Phosphorus (mg/dL)  Date Value  38/06/1750 3.6   Sodium (mmol/L)  Date Value  09/12/2021 140  12/04/2015 143   Assessment: Pharmacy has been consulted to monitor electrolytes in 76yo who presented to the ED with left arm weakness and diagnosed with a stroke. Patient received TNK in ED on 09/11/2020.  Goal of Therapy:  Electrolytes within normal limits  Plan:  --No replenishment currently needed. --Will continue to monitor electrolytes with morning labs  Tressie Ellis 09/12/2021 8:40 AM

## 2021-09-12 NOTE — TOC Progression Note (Signed)
Transition of Care [facility]) - Progression Note    Patient Details  Name: Kathy Baldwin MRN: [id number] Date of Birth: [birth year]  Transition of Care [facility]) CM/SW Contact  Allayne Butcher, RN Phone Number: 09/12/2021, 11:32 AM  Clinical Narrative:     Transition of Care [facility]) Screening Note   Patient Details  Name: Kathy Baldwin Date of Birth: 12-05-1945   Transition of Care [facility]) CM/SW Contact:    Allayne Butcher, RN Phone Number: 09/12/2021, 11:32 AM    Transition of Care Department [facility]) has reviewed patient and no TOC needs have been identified at this time. We will continue to monitor patient advancement through interdisciplinary progression rounds. If new patient transition needs arise, please place a TOC consult.          Expected Discharge Plan and Services                                                 Social Determinants of Health (SDOH) Interventions    Readmission Risk Interventions No flowsheet data found.

## 2021-09-12 NOTE — Telephone Encounter (Addendum)
Spoke with husband about wife's recent events to dates.

## 2021-09-13 DIAGNOSIS — R413 Other amnesia: Secondary | ICD-10-CM

## 2021-09-13 DIAGNOSIS — R531 Weakness: Secondary | ICD-10-CM

## 2021-09-13 DIAGNOSIS — I10 Essential (primary) hypertension: Secondary | ICD-10-CM

## 2021-09-13 DIAGNOSIS — K219 Gastro-esophageal reflux disease without esophagitis: Secondary | ICD-10-CM

## 2021-09-13 LAB — CBC
HCT: 39.8 % (ref 36.0–46.0)
Hemoglobin: 13.2 g/dL (ref 12.0–15.0)
MCH: 31.6 pg (ref 26.0–34.0)
MCHC: 33.2 g/dL (ref 30.0–36.0)
MCV: 95.2 fL (ref 80.0–100.0)
Platelets: 221 10*3/uL (ref 150–400)
RBC: 4.18 MIL/uL (ref 3.87–5.11)
RDW: 12.8 % (ref 11.5–15.5)
WBC: 7.8 10*3/uL (ref 4.0–10.5)
nRBC: 0 % (ref 0.0–0.2)

## 2021-09-13 LAB — BASIC METABOLIC PANEL
Anion gap: 7 (ref 5–15)
BUN: 19 mg/dL (ref 8–23)
CO2: 26 mmol/L (ref 22–32)
Calcium: 9.2 mg/dL (ref 8.9–10.3)
Chloride: 108 mmol/L (ref 98–111)
Creatinine, Ser: 0.68 mg/dL (ref 0.44–1.00)
GFR, Estimated: >60 mL/min (ref >60)
Glucose, Bld: 116 mg/dL — ABNORMAL HIGH (ref 70–99)
Potassium: 3.6 mmol/L (ref 3.5–5.1)
Sodium: 141 mmol/L (ref 135–145)

## 2021-09-13 MED ORDER — ASPIRIN 81 MG PO TBEC: 81.0000 mg | DELAYED_RELEASE_TABLET | Freq: Every day | ORAL | 0 refills | Status: DC

## 2021-09-13 MED ORDER — CLOPIDOGREL BISULFATE 75 MG PO TABS: 75.0000 mg | ORAL_TABLET | Freq: Every day | ORAL | 0 refills | Status: DC

## 2021-09-13 NOTE — Discharge Instructions (Signed)
Don't take naprosyn (interacts with aspirin) Dont take omeprazole (interacts with Plavix) Can take pepcid or protonix if having acid reflux (both over the counter

## 2021-09-13 NOTE — Discharge Summary (Signed)
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 Sabino Gasser 2010   DNR no code (do not resuscitate) 03/20/2017   GERD (gastroesophageal reflux disease)    Hearing loss    History of gallstones    Hyperlipidemia    Hypertension    Hypertrophy of nasal turbinates    Leg mass, left    in calf muscle seeing surgeon tomorrow regarding   Lymphocytosis    Menopause age 46   Multiple food allergies    chocolate and cabbage   OSA on CPAP    Osteopenia Oct. 2014   Sleep apnea    Stroke ([place]) 09/11/2021   Thyroid nodule    Vertigo    Vitamin D deficiency disease     HOSPITAL COURSE:   1.  Acute ischemic stroke.  Patient presented with left arm weakness.  Patient was given TNKase on 09/11/2020.  Patient has no further weakness.  EKG was in sinus rhythm.  Echocardiogram showed a normal EF.  MRI of the brain did not show any acute or subacute insult.  Did show old small vessel cerebral infarctions.  CT angio did not show any large vessel occlusion.  Proximal right ICA plaque less than 50% stenosis.  Patient will be on aspirin for 21 days then discontinue.  Continue Plavix lifelong.  Since LDL is at goal (66) continue Crestor 10 mg daily.   Patient did well with physical therapy and Occupational Therapy and no needs. 2.  Essential hypertension.  Blood pressure stable off medications.  Follow-up with PMD as outpatient to check blood pressure. 3.  GERD.  Discontinue omeprazole.  Can take Protonix or Pepcid over-the-counter if any acid reflux 4.  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  DISCHARGE CONDITIONS:   Satisfactory  CONSULTS OBTAINED:  Initially was on the critical care service Neurology  DRUG ALLERGIES:   Allergies  Allergen Reactions   Cabbage Other (See Comments)    Hypersensitivity on allergy testing   Chocolate Other (See Comments)    Hypersensitivity on allergy testing   Statins Other (See Comments)    Zocor and Pravastatin--leg cramps Other reaction(s): Other (See Comments) Zocor and Pravastatin--leg cramps    DISCHARGE MEDICATIONS:   Allergies as of 09/13/2021       Reactions   Cabbage Other (See Comments)   Hypersensitivity on allergy testing   Chocolate Other (See Comments)   Hypersensitivity on allergy testing   Statins Other (See Comments)   Zocor and Pravastatin--leg cramps Other reaction(s): Other (See Comments) Zocor and Pravastatin--leg cramps        Medication List     STOP taking these medications    amLODipine 5 MG tablet Commonly known as: NORVASC   naproxen  375 MG tablet Commonly known as: NAPROSYN   omeprazole 40 MG capsule Commonly known as: PRILOSEC       TAKE these medications    aspirin 81 MG EC tablet Take 1 tablet (81 mg total) by mouth daily. Swallow whole. What changed:  medication strength how much to take additional instructions   B-12 2500 MCG Tabs Take 1 tablet by mouth daily. Pt taking 1/2 of a 5000 mcg tablet   Calcium 600+D 600-20 MG-MCG Tabs Generic drug: Calcium Carb-Cholecalciferol Take by mouth daily. 2 capsule daily   clopidogrel 75 MG tablet Commonly known as: PLAVIX Take 1 tablet (75 mg total) by mouth daily.   COLLAGEN PO Take  by mouth.   donepezil 10 MG tablet Commonly known as: ARICEPT Take 10 mg by mouth at bedtime.   famotidine 20 MG tablet Commonly known as: PEPCID Take 1 tablet (20 mg total) by mouth 2 (two) times daily as needed for heartburn or indigestion.   FIBER PO Take by mouth.   fluticasone 50 MCG/ACT nasal spray Commonly known as: FLONASE SHAKE LIQUID AND USE 2 SPRAYS IN EACH NOSTRIL DAILY   HAIR/SKIN/NAILS/BIOTIN PO Take 1 tablet by mouth daily.   meclizine 25 MG tablet Commonly known as: ANTIVERT Take 25 mg by mouth 3 (three) times daily as needed for dizziness.   memantine 10 MG tablet Commonly known as: NAMENDA Take 10 mg by mouth 2 (two) times daily.   mirabegron ER 25 MG Tb24 tablet Commonly known as: Myrbetriq Take 1 tablet (25 mg total) by mouth daily.   multivitamin tablet Take 1 tablet by mouth daily.   PediaSure/Fiber Liqd Take 10 mLs by mouth.   polyethylene glycol 17 g packet Commonly known as: MIRALAX / GLYCOLAX Take 17 g by mouth daily.   Premarin vaginal cream Generic drug: conjugated estrogens Use a pea-sized amount externally and internally twice a week at night   rosuvastatin 10 MG tablet Commonly known as: CRESTOR TAKE 1 TABLET(10 MG) BY MOUTH AT BEDTIME         DISCHARGE INSTRUCTIONS:   Follow-up PMD 5 days  If you experience worsening of your admission symptoms, develop shortness of breath, life threatening emergency, suicidal or homicidal thoughts you must seek medical attention immediately by calling 911 or calling your MD immediately  if symptoms less severe.  You Must read complete instructions/literature along with all the possible adverse reactions/side effects for all the Medicines you take and that have been prescribed to you. Take any new Medicines after you have completely understood and accept all the possible adverse reactions/side effects.   Please note  You were cared for by a hospitalist during your hospital stay. If you  have any questions about your discharge medications or the care you received while you were in the hospital after you are discharged, you can call the unit and asked to speak with the hospitalist on call if the hospitalist that took care of you is not available. Once you are discharged, your primary care physician will handle any further medical issues. Please note that NO REFILLS for any discharge medications will be authorized once you are discharged, as it is imperative that you return to your primary care physician (or establish a relationship with a primary care physician if you do not have one) for your aftercare needs so that they can reassess your need for medications and monitor your lab values.    Today   CHIEF COMPLAINT:   Chief Complaint  Patient presents with  Code Stroke    HISTORY OF PRESENT ILLNESS:  Kathy Baldwin  is a 76 y.o. female patient admitted with left-sided weakness and code stroke.   VITAL SIGNS:  Blood pressure 139/61, pulse (!) 56, temperature 97.8 F (36.6 C), temperature source Oral, resp. rate 16, height 5\' 3"  (1.6 m), weight 65.4 kg, SpO2 97 %.  I/O:   Intake/Output Summary (Last 24 hours) at 09/13/2021 1551 Last data filed at 09/13/2021 1018 Gross per 24 hour  Intake 960 ml  Output --  Net 960 ml    PHYSICAL EXAMINATION:  GENERAL:  76 y.o.-year-old patient lying in the bed with no acute distress.  EYES: Pupils equal, round, reactive to light and accommodation. No scleral icterus.  HEENT: Head atraumatic, normocephalic. Oropharynx and nasopharynx clear.  LUNGS: Normal breath sounds bilaterally, no wheezing, rales,rhonchi or crepitation. No use of accessory muscles of respiration.  CARDIOVASCULAR: S1, S2 normal. No murmurs, rubs, or gallops.  ABDOMEN: Soft, non-tender, non-distended.  EXTREMITIES: No pedal edema.  NEUROLOGIC: Cranial nerves II through XII are intact. Muscle strength 5/5 in all extremities. Sensation intact. Gait not checked.   PSYCHIATRIC: The patient is alert and oriented x 3.  SKIN: No obvious rash, lesion, or ulcer.   DATA REVIEW:   CBC Recent Labs  Lab 09/13/21 0617  WBC 7.8  HGB 13.2  HCT 39.8  PLT 221    Chemistries  Recent Labs  Lab 09/11/21 0857 09/12/21 0526 09/13/21 0617  NA 142 140 141  K 3.8 3.6 3.6  CL 110 107 108  CO2 27 28 26   GLUCOSE 98 97 116*  BUN 21 17 19   CREATININE 1.07* 0.72 0.68  CALCIUM 9.4 9.3 9.2  MG  --  2.3  --   AST 36  --   --   ALT 20  --   --   ALKPHOS 80  --   --   BILITOT 1.0  --   --      Microbiology Results  Results for orders placed or performed during the hospital encounter of 09/11/21  Resp Panel by RT-PCR (Flu A&B, Covid) Nasopharyngeal Swab     Status: None   Collection Time: 09/11/21  8:57 AM   Specimen: Nasopharyngeal Swab; Nasopharyngeal(NP) swabs in vial transport medium  Result Value Ref Range Status   SARS Coronavirus 2 by RT PCR NEGATIVE NEGATIVE Final    Comment: (NOTE) SARS-CoV-2 target nucleic acids are NOT DETECTED.  The SARS-CoV-2 RNA is generally detectable in upper respiratory specimens during the acute phase of infection. The lowest concentration of SARS-CoV-2 viral copies this assay can detect is 138 copies/mL. A negative result does not preclude SARS-Cov-2 infection and should not be used as the sole basis for treatment or other patient management decisions. A negative result may occur with  improper specimen collection/handling, submission of specimen other than nasopharyngeal swab, presence of viral mutation(s) within the areas targeted by this assay, and inadequate number of viral copies(<138 copies/mL). A negative result must be combined with clinical observations, patient history, and epidemiological information. The expected result is Negative.  Fact Sheet for Patients:  EntrepreneurPulse.com.au  Fact Sheet for Healthcare Providers:  IncredibleEmployment.be  This test is no  t yet approved or cleared by the [place] FDA and  has been authorized for detection and/or diagnosis of SARS-CoV-2 by FDA under an Emergency Use Authorization (EUA). This EUA will remain  in effect (meaning this test can be used) for the duration of the COVID-19 declaration under Section 564(b)(1)  of the Act, 21 U.S.C.section 360bbb-3(b)(1), unless the authorization is terminated  or revoked sooner.       Influenza A by PCR NEGATIVE NEGATIVE Final   Influenza B by PCR NEGATIVE NEGATIVE Final    Comment: (NOTE) The Xpert Xpress SARS-CoV-2/FLU/RSV plus assay is intended as an aid in the diagnosis of influenza from Nasopharyngeal swab specimens and should not be used as a sole basis for treatment. Nasal washings and aspirates are unacceptable for Xpert Xpress SARS-CoV-2/FLU/RSV testing.  Fact Sheet for Patients: EntrepreneurPulse.com.au  Fact Sheet for Healthcare Providers: IncredibleEmployment.be  This test is not yet approved or cleared by the [place] FDA and has been authorized for detection and/or diagnosis of SARS-CoV-2 by FDA under an Emergency Use Authorization (EUA). This EUA will remain in effect (meaning this test can be used) for the duration of the COVID-19 declaration under Section 564(b)(1) of the Act, 21 U.S.C. section 360bbb-3(b)(1), unless the authorization is terminated or revoked.  Performed at [facility], [address] [place], [place] [address]   MRSA Next Gen by PCR, Nasal     Status: None   Collection Time: 09/11/21  6:23 PM   Specimen: Nasal Mucosa; Nasal Swab  Result Value Ref Range Status   MRSA by PCR Next Gen NOT DETECTED NOT DETECTED Final    Comment: (NOTE) The GeneXpert MRSA Assay (FDA approved for NASAL specimens only), is one component of a comprehensive MRSA colonization surveillance program. It is not intended to diagnose MRSA infection nor to guide or monitor treatment for MRSA  infections. Test performance is not FDA approved in patients less than 58 years old. Performed at [facility], [address] [place], [place] [address]     RADIOLOGY:  CT HEAD WO CONTRAST (5MM)  Result Date: 09/12/2021 CLINICAL DATA:  76 year old female code stroke presentation status post tPA. EXAM: CT HEAD WITHOUT CONTRAST TECHNIQUE: Contiguous axial images were obtained from the base of the skull through the vertex without intravenous contrast. COMPARISON:  Brain MRI 09/11/2021 and earlier. FINDINGS: Brain: No midline shift, ventriculomegaly, mass effect, evidence of mass lesion, intracranial hemorrhage or evidence of cortically based acute infarction. Small chronic bilateral cerebellar infarcts and patchy cerebral white matter hypodensity remains stable. Vascular: Calcified atherosclerosis at the skull base. No suspicious intracranial vascular hyperdensity. Skull: Negative. Sinuses/Orbits: Visualized paranasal sinuses and mastoids are clear. Other: Visualized orbits and scalp soft tissues are within normal limits. IMPRESSION: Stable non contrast CT appearance of the brain. No acute intracranial hemorrhage identified. 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 Electronically Signed   By: Genevie Ann M.D.   On: 09/12/2021 09:19   ECHOCARDIOGRAM COMPLETE  Result Date: 09/12/2021    ECHOCARDIOGRAM REPORT   Patient Name:   Kathy Baldwin Date of Exam: 09/12/2021 Medical Rec #:  [id number]           Height:       63.0 in Accession #:    [id number]          Weight:       144.2 lb Date of Birth:  04/16/1946           BSA:          1.683 m Patient Age:    26 years            BP:           143/72 mmHg Patient Gender: F  HR:           70 bpm. Exam Location:  [facility] Procedure: 2D Echo, Color Doppler and Cardiac Doppler Indications:     I63.9 Stroke  History:         Patient has prior history of Echocardiogram examinations. Risk                  Factors:Hypertension,  Dyslipidemia and Sleep Apnea.  Sonographer:     Charmayne Sheer Referring Phys:  [id number] Tonye Royalty Diagnosing Phys: Nelva Bush MD  Sonographer Comments: Suboptimal subcostal window. IMPRESSIONS  1. Left ventricular ejection fraction, by estimation, is 55 to 60%. The left ventricle has normal function. The left ventricle has no regional wall motion abnormalities. Left ventricular diastolic parameters are consistent with Grade I diastolic dysfunction (impaired relaxation). Elevated left atrial pressure.  2. Right ventricular systolic function is normal. The right ventricular size is normal.  3. Right atrial size was mildly dilated.  4. The mitral valve is degenerative. No evidence of mitral valve regurgitation. No evidence of mitral stenosis.  5. The aortic valve is tricuspid. There is mild thickening of the aortic valve. Aortic valve regurgitation is not visualized. Aortic valve sclerosis is present, with no evidence of aortic valve stenosis.  6. There is borderline dilatation of the ascending aorta, measuring 36 mm.  7. The inferior vena cava is normal in size with greater than 50% respiratory variability, suggesting right atrial pressure of 3 mmHg. FINDINGS  Left Ventricle: Left ventricular ejection fraction, by estimation, is 55 to 60%. The left ventricle has normal function. The left ventricle has no regional wall motion abnormalities. The left ventricular internal cavity size was normal in size. There is  borderline left ventricular hypertrophy. Left ventricular diastolic parameters are consistent with Grade I diastolic dysfunction (impaired relaxation). Elevated left atrial pressure. Right Ventricle: The right ventricular size is normal. No increase in right ventricular wall thickness. Right ventricular systolic function is normal. Left Atrium: Left atrial size was normal in size. Right Atrium: Right atrial size was mildly dilated. Pericardium: The pericardium was not well visualized. Mitral Valve: The  mitral valve is degenerative in appearance. There is mild thickening of the mitral valve leaflet(s). Mild mitral annular calcification. No evidence of mitral valve regurgitation. No evidence of mitral valve stenosis. MV peak gradient, 6.0 mmHg. The mean mitral valve gradient is 1.0 mmHg. Tricuspid Valve: The tricuspid valve is not well visualized. Tricuspid valve regurgitation is trivial. Aortic Valve: The aortic valve is tricuspid. There is mild thickening of the aortic valve. Aortic valve regurgitation is not visualized. Aortic valve sclerosis is present, with no evidence of aortic valve stenosis. Aortic valve mean gradient measures 4.0  mmHg. Aortic valve peak gradient measures 8.9 mmHg. Aortic valve area, by VTI measures 1.92 cm. Pulmonic Valve: The pulmonic valve was not well visualized. Pulmonic valve regurgitation is not visualized. No evidence of pulmonic stenosis. Aorta: The aortic root is normal in size and structure. There is borderline dilatation of the ascending aorta, measuring 36 mm. Pulmonary Artery: The pulmonary artery is not well seen. Venous: The inferior vena cava is normal in size with greater than 50% respiratory variability, suggesting right atrial pressure of 3 mmHg. IAS/Shunts: The interatrial septum was not well visualized.  LEFT VENTRICLE PLAX 2D LVIDd:         4.01 cm   Diastology LVIDs:         2.71 cm   LV e' medial:    4.35 cm/s LV PW:  1.00 cm   LV E/e' medial:  15.0 LV IVS:        0.94 cm   LV e' lateral:   6.64 cm/s LVOT diam:     1.90 cm   LV E/e' lateral: 9.8 LV SV:         57 LV SV Index:   34 LVOT Area:     2.84 cm  RIGHT VENTRICLE RV Basal diam:  2.94 cm LEFT ATRIUM             Index        RIGHT ATRIUM           Index LA diam:        3.90 cm 2.32 cm/m   RA Area:     18.20 cm LA Vol (A2C):   23.8 ml 14.15 ml/m  RA Volume:   53.40 ml  31.74 ml/m LA Vol (A4C):   36.4 ml 21.63 ml/m LA Biplane Vol: 32.2 ml 19.14 ml/m  AORTIC VALVE                    PULMONIC VALVE  AV Area (Vmax):    1.74 cm     PV Vmax:       1.02 m/s AV Area (Vmean):   1.88 cm     PV Vmean:      66.000 cm/s AV Area (VTI):     1.92 cm     PV VTI:        0.204 m AV Vmax:           149.00 cm/s  PV Peak grad:  4.2 mmHg AV Vmean:          97.200 cm/s  PV Mean grad:  2.0 mmHg AV VTI:            0.298 m AV Peak Grad:      8.9 mmHg AV Mean Grad:      4.0 mmHg LVOT Vmax:         91.40 cm/s LVOT Vmean:        64.500 cm/s LVOT VTI:          0.202 m LVOT/AV VTI ratio: 0.68  AORTA Ao Root diam: 3.30 cm MITRAL VALVE MV Area (PHT): 2.67 cm     SHUNTS MV Area VTI:   2.07 cm     Systemic VTI:  0.20 m MV Peak grad:  6.0 mmHg     Systemic Diam: 1.90 cm MV Mean grad:  1.0 mmHg MV Vmax:       1.22 m/s MV Vmean:      51.3 cm/s MV Decel Time: 284 msec MV E velocity: 65.10 cm/s MV A velocity: 110.00 cm/s MV E/A ratio:  0.59 Harrell Gave End MD Electronically signed by Nelva Bush MD Signature Date/Time: 09/12/2021/3:26:41 PM    Final       Management plans discussed with the patient, family and they are in agreement.  CODE STATUS:  Code Status History     Date Active Date Inactive Code Status Order ID Comments User Context   09/11/2021 1346 09/13/2021 1537 Full Code [id number]  Tonye Royalty, NP ED       TOTAL TIME TAKING CARE OF THIS PATIENT: 34 minutes.    Loletha Grayer M.D on 09/13/2021 at 3:51 PM   [facility]  CC: Primary care physician; Ria Bush, MD

## 2021-09-14 ENCOUNTER — Telehealth: Payer: Self-pay

## 2021-09-14 NOTE — Telephone Encounter (Signed)
Transition Care Management Follow-up Telephone Call Date of discharge and from where: 09/13/21 from [facility] How have you been since you were released from the hospital? Patient states feeling better since discharge Any questions or concerns? No  Items Reviewed: Did the pt receive and understand the discharge instructions provided? Yes  Medications obtained and verified? Yes  Other? No  Any new allergies since your discharge? No  Dietary orders reviewed? Yes Do you have support at home? Yes   Home Care and Equipment/Supplies: Were home health services ordered? no If so, what is the name of the agency? N/A  Has the agency set up a time to come to the patient's home? not applicable Were any new equipment or medical supplies ordered?  No What is the name of the medical supply agency? N/A Were you able to get the supplies/equipment? not applicable Do you have any questions related to the use of the equipment or supplies? No  Functional Questionnaire: (I = Independent and D = Dependent) ADLs: I  Bathing/Dressing- I  Meal Prep- I  Eating- I  Maintaining continence- I  Transferring/Ambulation- I  Managing Meds- I  Follow up appointments reviewed:  PCP Hospital f/u appt confirmed? Yes  Scheduled to see Dr. Sharen Hones on 09/26/21 @ 12:00pm. Specialist Hospital f/u appt confirmed? No  , specialist f/u Are transportation arrangements needed? No  If their condition worsens, is the pt aware to call PCP or go to the Emergency Dept.? Yes Was the patient provided with contact information for the PCP's office or ED? Yes Was to pt encouraged to call back with questions or concerns? Yes

## 2021-09-14 NOTE — Chronic Care Management (AMB) (Addendum)
Chronic Care Management Pharmacy Assistant   Name: Kathy Baldwin  MRN: [id number] DOB: [birth year]  Reason for Encounter: Medication Adherence and Delivery Coordination   Recent office visits:  07/09/21 - Family Medicine - Patient presented for neck pain. Continue tylenol and warm compresses. No medication changes.  Recent consult visits:  None since last CCM contact  Hospital visits:  Medication Reconciliation was completed by comparing discharge summary, patients EMR and Pharmacy list, and upon discussion with patient.  Admitted to the hospital on 09/11/21 due to stroke. Discharge date was 09/13/21. Discharged from [facility].    New?Medications Started at [facility] Discharge:?? -Started Plavix 75mg     Medication Changes at Hospital Discharge: -Changed Aspirin   Medications Discontinued at Hospital Discharge: -Stopped Amlodipine 5mg  -Stopped omeprazole 40mg  -Stopped naproxen 375mg    Medications that remain the same after Hospital Discharge:??  -All other medications will remain the same.    Medications: Outpatient Encounter Medications as of 09/14/2021  Medication Sig   aspirin EC 81 MG EC tablet Take 1 tablet (81 mg total) by mouth daily. Swallow whole.   Calcium Carb-Cholecalciferol (CALCIUM 600+D) 600-800 MG-UNIT TABS Take by mouth daily. 2 capsule daily   clopidogrel (PLAVIX) 75 MG tablet Take 1 tablet (75 mg total) by mouth daily.   COLLAGEN PO Take by mouth.   conjugated estrogens (PREMARIN) vaginal cream Use a pea-sized amount externally and internally twice a week at night   Cyanocobalamin (B-12) 2500 MCG TABS Take 1 tablet by mouth daily. Pt taking 1/2 of a 5000 mcg tablet   donepezil (ARICEPT) 10 MG tablet Take 10 mg by mouth at bedtime.    famotidine (PEPCID) 20 MG tablet Take 1 tablet (20 mg total) by mouth 2 (two) times daily as needed for heartburn or indigestion.   FIBER PO Take by mouth.   fluticasone (FLONASE) 50 MCG/ACT nasal spray SHAKE LIQUID  AND USE 2 SPRAYS IN EACH NOSTRIL DAILY   meclizine (ANTIVERT) 25 MG tablet Take 25 mg by mouth 3 (three) times daily as needed for dizziness.   memantine (NAMENDA) 10 MG tablet Take 10 mg by mouth 2 (two) times daily.   mirabegron ER (MYRBETRIQ) 25 MG TB24 tablet Take 1 tablet (25 mg total) by mouth daily.   Multiple Vitamin (MULTIVITAMIN) tablet Take 1 tablet by mouth daily.   Multiple Vitamins-Minerals (HAIR/SKIN/NAILS/BIOTIN PO) Take 1 tablet by mouth daily.   PEDIASURE/FIBER (PEDIASURE/FIBER) LIQD Take 10 mLs by mouth.   polyethylene glycol (MIRALAX / GLYCOLAX) packet Take 17 g by mouth daily.   rosuvastatin (CRESTOR) 10 MG tablet TAKE 1 TABLET(10 MG) BY MOUTH AT BEDTIME   No facility-administered encounter medications on file as of 09/14/2021.   BP Readings from Last 3 Encounters:  09/13/21 139/61  07/09/21 130/62  06/06/21 130/70    Lab Results  Component Value Date   HGBA1C 5.6 09/11/2021      Recent OV, Consult or Hospital visit:  Recent medication changes indicated:  Stop amlodipine, naproxen, omeprazole, short term hold due to inpatient visit.  Last adherence delivery date: 06/27/21      Patient is due for next adherence delivery on: 09/24/21  Spoke with patient on 09/17/21 reviewed medications and coordinated delivery. Spoke with husband, 11/09/2021.  This delivery to include: Adherence Packaging  90 Days  Packs:            Donepezil 10 mg one daily - 1 bedtime            Famotidine 20 mg  one daily - 1 breakfast, 1 evening meal            Memantine 10 mg - 1 breakfast, 1 evening meal              Rosuvastatin 10 mg one daily - 1 bedtime            Myrbetriq 25 mg -- take 1 tablet at breakfast   VIAL medications: Fluticasone nasal spray- 2 sprays each nostril PRN Patient need alternative to omeprazole for acid reflux - PCP notified  Patient declined the following medications this month: Premarin vaginal cream - twice weekly at night (patient has plenty on hand) Naproxen  375mg  take 1 tablet 2 times daily with meal (discontinued during hospital visit) Amlodipine 5 mg one daily - 1 breakfast (discontinued during hospital visit) Omeprazole 40 mg daily - PRN (discontinued during hospital visit) NEW * Plavix 75 mg - Filled at [facility], 90 DS 09/2021 NEW * Aspirin 81 mg - Filled at [facility], 90 DS 09/2021  Any concerns about your medications? Patient will follow up with PCP 09/24/21 and update with [facility] with any medication changes.   How often do you forget or accidentally miss a dose? Rarely - missed a few doses during hospital visit  Is patient in packaging Yes  Any concerns or issues with your packaging? No concerns at this time   Refills requested from providers include: memantine, donepezil  Confirmed delivery date of 09/24/21, advised patient that pharmacy will contact them the morning of delivery.  No BP readings available today.   Annual wellness visit in last year? Yes Most Recent BP reading:   98/64  71-P  09/11/21 (ED)  11/09/21, CPP notified  Phil Dopp, [facility] Pharmacy Assistant [phone]  I have reviewed the care management and care coordination activities outlined in this encounter and I am certifying that I agree with the content of this note. See telephone note 09/17/20. Recommend monitor home BP closely. Recommend alternative PPI to omeprazole due to drug interaction with Plavix.  11/15/20, PharmD Clinical Pharmacist [facility] [phone]

## 2021-09-17 ENCOUNTER — Telehealth: Payer: Self-pay

## 2021-09-17 ENCOUNTER — Encounter: Payer: Self-pay | Admitting: Family Medicine

## 2021-09-17 MED ORDER — PANTOPRAZOLE SODIUM 40 MG PO TBEC: 40.0000 mg | DELAYED_RELEASE_TABLET | Freq: Every day | ORAL | 3 refills | Status: DC | PRN

## 2021-09-17 NOTE — Telephone Encounter (Signed)
Spoke with patient's spouse, [name]. He would like an alternative PPI prescription since she can no longer take omeprazole with Plavix. Per hospital visit, she will be on Plavix indefinitely. Will forward to PCP. Please send to [facility]. Thanks!

## 2021-09-17 NOTE — Telephone Encounter (Signed)
Already addressed via phone note.

## 2021-09-17 NOTE — Telephone Encounter (Signed)
I've sent in pantoprazole for patient. Thank you.

## 2021-09-19 ENCOUNTER — Inpatient Hospital Stay: Payer: PPO | Admitting: Family Medicine

## 2021-09-20 ENCOUNTER — Other Ambulatory Visit: Payer: Self-pay

## 2021-09-20 ENCOUNTER — Encounter: Payer: Self-pay | Admitting: Family Medicine

## 2021-09-20 ENCOUNTER — Ambulatory Visit (INDEPENDENT_AMBULATORY_CARE_PROVIDER_SITE_OTHER): Payer: PPO | Admitting: Family Medicine

## 2021-09-20 VITALS — BP 120/60 | HR 72 | Temp 97.2°F | Ht 62.0 in | Wt 141.2 lb

## 2021-09-20 DIAGNOSIS — M7062 Trochanteric bursitis, left hip: Secondary | ICD-10-CM

## 2021-09-20 MED ORDER — TRIAMCINOLONE ACETONIDE 40 MG/ML IJ SUSP
40.0000 mg | Freq: Once | INTRAMUSCULAR | Status: AC
  Administered 2021-09-20: 40 mg via INTRA_ARTICULAR

## 2021-09-20 NOTE — Progress Notes (Signed)
° ° °  [name], MD, [place] at [facility] [place], [address]  Phone: [phone]   FAX: [phone]  Kathy Baldwin - 76 y.o. female   MRN [id number]   Date of Birth: 06-29-1946  Date: 09/20/2021   PCP: Ria Bush, MD   Referral: Ria Bush, MD  Chief Complaint  Patient presents with   Hip Pain    Left-Injection    This visit occurred during the SARS-CoV-2 public health emergency.  Safety protocols were in place, including screening questions prior to the visit, additional usage of staff PPE, and extensive cleaning of exam room while observing appropriate contact time as indicated for disinfecting solutions.    Aspiration/Injection Procedure Note Kathy Baldwin Apr 09, 1946 Date of procedure: 09/20/2021  Procedure: Large Joint Aspiration / Injection of Hip, Trochanteric Bursa, L Indications: Pain  Procedure Details Verbal consent obtained. Risks, benefits, and alternatives reviewed. Greater trochanter sterilely prepped with Chloraprep. Ethyl Chloride used for anesthesia. 9 cc of Lidocaine 1% injected with 1 mL of Kenalog 40 mg into trochanteric bursa at area of maximal tenderness at greater trochanter. Needle taken to bone to troch bursa, flows easily. Bursa massaged. No bleeding and no complications. Decreased pain after injection. Needle: 22 gauge spinal needle Medication: 1 mL of Kenalog 40 mg     ICD-10-CM   1. Trochanteric bursitis of left hip  M70.62 triamcinolone acetonide (KENALOG-40) injection 40 mg      Meds ordered this encounter  Medications   triamcinolone acetonide (KENALOG-40) injection 40 mg       Signed,  Nikya Busler T. Thales Knipple, MD

## 2021-09-24 ENCOUNTER — Inpatient Hospital Stay: Payer: PPO | Admitting: Family Medicine

## 2021-09-26 ENCOUNTER — Other Ambulatory Visit: Payer: Self-pay

## 2021-09-26 ENCOUNTER — Encounter: Payer: Self-pay | Admitting: Family Medicine

## 2021-09-26 ENCOUNTER — Inpatient Hospital Stay: Payer: PPO | Admitting: Family Medicine

## 2021-09-26 ENCOUNTER — Other Ambulatory Visit (INDEPENDENT_AMBULATORY_CARE_PROVIDER_SITE_OTHER): Payer: PPO

## 2021-09-26 ENCOUNTER — Ambulatory Visit (INDEPENDENT_AMBULATORY_CARE_PROVIDER_SITE_OTHER): Payer: PPO | Admitting: Family Medicine

## 2021-09-26 VITALS — BP 156/70 | HR 62 | Temp 97.8°F | Ht 62.0 in | Wt 138.2 lb

## 2021-09-26 DIAGNOSIS — I10 Essential (primary) hypertension: Secondary | ICD-10-CM

## 2021-09-26 DIAGNOSIS — R7989 Other specified abnormal findings of blood chemistry: Secondary | ICD-10-CM

## 2021-09-26 DIAGNOSIS — Z8673 Personal history of transient ischemic attack (TIA), and cerebral infarction without residual deficits: Secondary | ICD-10-CM

## 2021-09-26 DIAGNOSIS — R5383 Other fatigue: Secondary | ICD-10-CM

## 2021-09-26 DIAGNOSIS — E785 Hyperlipidemia, unspecified: Secondary | ICD-10-CM | POA: Diagnosis not present

## 2021-09-26 DIAGNOSIS — E782 Mixed hyperlipidemia: Secondary | ICD-10-CM | POA: Diagnosis not present

## 2021-09-26 DIAGNOSIS — I639 Cerebral infarction, unspecified: Secondary | ICD-10-CM

## 2021-09-26 DIAGNOSIS — R531 Weakness: Secondary | ICD-10-CM

## 2021-09-26 LAB — VITAMIN D 25 HYDROXY (VIT D DEFICIENCY, FRACTURES): VITD: 61.54 ng/mL (ref 30.00–100.00)

## 2021-09-26 LAB — TSH: TSH: 0.33 u[IU]/mL — ABNORMAL LOW (ref 0.35–5.50)

## 2021-09-26 LAB — T4, FREE: Free T4: 0.72 ng/dL (ref 0.60–1.60)

## 2021-09-26 LAB — FOLATE: Folate: >24.2 ng/mL (ref >5.9)

## 2021-09-26 LAB — T3, FREE: T3, Free: 2.8 pg/mL (ref 2.3–4.2)

## 2021-09-26 LAB — VITAMIN B12: Vitamin B-12: >1550 pg/mL — ABNORMAL HIGH (ref 211–911)

## 2021-09-26 NOTE — Assessment & Plan Note (Signed)
Recent FLP stable on crestor - continue.

## 2021-09-26 NOTE — Addendum Note (Signed)
Addended by: Alvina Chou on: 09/26/2021 02:37 PM   Modules accepted: Orders

## 2021-09-26 NOTE — Assessment & Plan Note (Signed)
Pt/husband note significant ongoing fatigue since stroke diagnosis. Fortunately other neurologic symptoms have fully resolved. Will check labwork today for reversible causes of fatigue.

## 2021-09-26 NOTE — Progress Notes (Signed)
Patient ID: Kathy Baldwin, female    DOB: 1946-05-26, 76 y.o.   MRN: [id number]  This visit was conducted in person.  BP (!) 156/70 (BP Location: Right Arm, Cuff Size: Normal)    Pulse 62    Temp 97.8 F (36.6 C) (Temporal)    Ht 5\' 2"  (1.575 m)    Wt 138 lb 4 oz (62.7 kg)    SpO2 96%    BMI 25.29 kg/m   On repeat bp testing 160/70   CC: hosp f/u visit  Subjective:   HPI: Kathy Baldwin is a 76 y.o. female presenting on 09/26/2021 for Hospitalization Follow-up (Admitted on 09/11/21 at [facility], dx CVA.  C/o fatigue. )   Recent hospitalization for acute ischemic stroke presenting with left sided weakness - treated with TNKase with significant benefit. MRI brain without evidence of acute stroke but did show old cerebral infarcts. CT antio and echocardiogram overall reassuring, proximal R ICA plaque <50% stenosed. Planned aspirin x 3 wks + plavix indefinitely. Planned continue crestor 10mg  daily as LDL was at goal.  Hospital records reviewed. Med rec performed. Omeprazole changed to pantoprazole after starting clopidogrel - planned indefinite antiplatelet treatment. Aricept and namenda were continued. Amlodipine 5mg  was stopped however BP elevated today - they have not been checking at home.  Notes ongoing fatigue since stroke. Denies paresthesias. She had some persistent L thumb stiffness/weakness but that is also improving.   R handed.  Left hemiparesis symptoms are all resolved.   Home health not set up - not needed.  Other follow up appointments scheduled: neurology [facility]) 10/24/2021, cardiology ) 11/02/2021.   She had L trochanteric bursitis injection by Dr 10/26/2021 earlier this month.  ______________________________________________________________________ Hospital admission: 09/11/2021 Hospital discharge: 09/13/2021 TCM f/u phone call: performed on 09/14/2021.   Discharge diagnosis: CVA with L sided weakness     Relevant past medical, surgical, family and social  history reviewed and updated as indicated. Interim medical history since our last visit reviewed. Allergies and medications reviewed and updated. Outpatient Medications Prior to Visit  Medication Sig Dispense Refill   amLODipine (NORVASC) 2.5 MG tablet Take 1 tablet (2.5 mg total) by mouth daily.     aspirin EC 81 MG EC tablet Take 1 tablet (81 mg total) by mouth daily. Swallow whole. 21 tablet 0   Calcium Carb-Cholecalciferol (CALCIUM 600+D) 600-800 MG-UNIT TABS Take by mouth daily. 2 capsule daily     clopidogrel (PLAVIX) 75 MG tablet Take 1 tablet (75 mg total) by mouth daily. 90 tablet 0   COLLAGEN PO Take by mouth.     conjugated estrogens (PREMARIN) vaginal cream Use a pea-sized amount externally and internally twice a week at night 42.5 g 1   Cyanocobalamin (B-12) 2500 MCG TABS Take 1 tablet by mouth daily. Pt taking 1/2 of a 5000 mcg tablet     donepezil (ARICEPT) 10 MG tablet Take 10 mg by mouth at bedtime.      famotidine (PEPCID) 20 MG tablet Take 1 tablet (20 mg total) by mouth 2 (two) times daily as needed for heartburn or indigestion. 180 tablet 3   FIBER PO Take by mouth.     fluticasone (FLONASE) 50 MCG/ACT nasal spray SHAKE LIQUID AND USE 2 SPRAYS IN EACH NOSTRIL DAILY 48 g 2   meclizine (ANTIVERT) 25 MG tablet Take 25 mg by mouth 3 (three) times daily as needed for dizziness.     memantine (NAMENDA) 10 MG tablet Take 10 mg by mouth 2 (  two) times daily.     mirabegron ER (MYRBETRIQ) 25 MG TB24 tablet Take 1 tablet (25 mg total) by mouth daily. 90 tablet 2   Multiple Vitamin (MULTIVITAMIN) tablet Take 1 tablet by mouth daily.     Multiple Vitamins-Minerals (HAIR/SKIN/NAILS/BIOTIN PO) Take 1 tablet by mouth daily.     pantoprazole (PROTONIX) 40 MG tablet Take 1 tablet (40 mg total) by mouth daily as needed (GERD symptoms). 90 tablet 3   PEDIASURE/FIBER (PEDIASURE/FIBER) LIQD Take 10 mLs by mouth.     polyethylene glycol (MIRALAX / GLYCOLAX) packet Take 17 g by mouth daily.      rosuvastatin (CRESTOR) 10 MG tablet TAKE 1 TABLET(10 MG) BY MOUTH AT BEDTIME 90 tablet 3   No facility-administered medications prior to visit.     Per HPI unless specifically indicated in ROS section below Review of Systems  Objective:  BP (!) 156/70 (BP Location: Right Arm, Cuff Size: Normal)    Pulse 62    Temp 97.8 F (36.6 C) (Temporal)    Ht 5\' 2"  (1.575 m)    Wt 138 lb 4 oz (62.7 kg)    SpO2 96%    BMI 25.29 kg/m   Wt Readings from Last 3 Encounters:  09/26/21 138 lb 4 oz (62.7 kg)  09/20/21 141 lb 4 oz (64.1 kg)  09/12/21 144 lb 2.9 oz (65.4 kg)      Physical Exam Vitals and nursing note reviewed.  Constitutional:      Appearance: Normal appearance. She is not ill-appearing.  Cardiovascular:     Rate and Rhythm: Normal rate and regular rhythm.     Pulses: Normal pulses.     Heart sounds: Normal heart sounds. No murmur heard. Pulmonary:     Effort: Pulmonary effort is normal. No respiratory distress.     Breath sounds: Normal breath sounds. No wheezing, rhonchi or rales.  Musculoskeletal:        General: No tenderness. Normal range of motion.     Left lower leg: No edema.  Skin:    General: Skin is warm and dry.     Findings: No rash.  Neurological:     General: No focal deficit present.     Mental Status: She is alert.     Sensory: Sensation is intact.     Motor: Motor function is intact.     Comments:  5/5 BUE and BLE strength Grip strenght intact Bilateral hand strength intact with testing of pincer grasp and thumb opposition   Psychiatric:        Mood and Affect: Mood normal.        Behavior: Behavior normal.      Lab Results  Component Value Date   WBC 7.8 09/13/2021   HGB 13.2 09/13/2021   HCT 39.8 09/13/2021   MCV 95.2 09/13/2021   PLT 221 09/13/2021    Lab Results  Component Value Date   CREATININE 0.68 09/13/2021   BUN 19 09/13/2021   NA 141 09/13/2021   K 3.6 09/13/2021   CL 108 09/13/2021   CO2 26 09/13/2021    Assessment & Plan:   This visit occurred during the SARS-CoV-2 public health emergency.  Safety protocols were in place, including screening questions prior to the visit, additional usage of staff PPE, and extensive cleaning of exam room while observing appropriate contact time as indicated for disinfecting solutions.   Problem List Items Addressed This Visit     Hyperlipidemia    Recent FLP stable on crestor -  continue.       Relevant Medications   amLODipine (NORVASC) 2.5 MG tablet   Hypertension    BP elevated in office today. Advised restart amlodipine 2.5mg  daily with option to increase to 5mg  if needed if elevated home readings persist. They will start monitoring BP daily at home. Keep f/u with cards and neuro for next month.       Relevant Medications   amLODipine (NORVASC) 2.5 MG tablet   Stroke [facility](HCC) - Primary    Recent hospitalization for left sided hemiparesis that did fully resolve after TNKase treatment in the hospital with subsequent reassuring brain imaging. Fortunately hospital workup was overall normal.  Rec restart amlodipine for persistently elevated BP noted in office today.  DAPT x 3 wks then start new daily plavix.  Keep cardiology and neurology follow up.       Relevant Medications   amLODipine (NORVASC) 2.5 MG tablet   Left-sided weakness    This has now resolved.       Other fatigue    Pt/husband note significant ongoing fatigue since stroke diagnosis. Fortunately other neurologic symptoms have fully resolved. Will check labwork today for reversible causes of fatigue.       Relevant Orders   Vitamin B12   VITAMIN D 25 Hydroxy (Vit-D Deficiency, Fractures)   Folate   TSH     No orders of the defined types were placed in this encounter.  Orders Placed This Encounter  Procedures   Vitamin B12   VITAMIN D 25 Hydroxy (Vit-D Deficiency, Fractures)   Folate   TSH     Patient Instructions  Labs today  BP was elevated today - restart amlodipine 2.5mg  daily until you  see Dr Gwen PoundsKowalski next month.  Start monitoring blood pressures at home - with option to increase to 5mg  amlodipine if BP staying consistently >140/90. Limit salt/sodium in the diet, ensure good fruits/vegetables, whole grains for healthy diet choices.  I'm glad you're doing well!  Follow up plan: Return if symptoms worsen or fail to improve.  Eustaquio BoydenJavier Gianmarco Roye, MD

## 2021-09-26 NOTE — Assessment & Plan Note (Signed)
This has now resolved.  

## 2021-09-26 NOTE — Patient Instructions (Addendum)
Labs today  BP was elevated today - restart amlodipine 2.5mg  daily until you see Dr Nehemiah Massed next month.  Start monitoring blood pressures at home - with option to increase to 5mg  amlodipine if BP staying consistently >140/90. Limit salt/sodium in the diet, ensure good fruits/vegetables, whole grains for healthy diet choices.  I'm glad you're doing well!

## 2021-09-26 NOTE — Assessment & Plan Note (Signed)
BP elevated in office today. Advised restart amlodipine 2.5mg  daily with option to increase to 5mg  if needed if elevated home readings persist. They will start monitoring BP daily at home. Keep f/u with cards and neuro for next month.

## 2021-09-26 NOTE — Assessment & Plan Note (Signed)
Recent hospitalization for left sided hemiparesis that did fully resolve after TNKase treatment in the hospital with subsequent reassuring brain imaging. Fortunately hospital workup was overall normal.  Rec restart amlodipine for persistently elevated BP noted in office today.  DAPT x 3 wks then start new daily plavix.  Keep cardiology and neurology follow up.

## 2021-10-16 ENCOUNTER — Telehealth: Payer: Self-pay

## 2021-10-16 NOTE — Telephone Encounter (Signed)
Pt reached out to [facility] regarding amlodipine refill request. Per visit notes, pt was taken off amlodipine 5 mg in hospital 1/3 and PCP, [name] restarted amlodipine 2.5 mg on 09/26/21. I've attempted to reach patient's husband to see if they would like [place] to send it out and which dose. Left VM.

## 2021-10-16 NOTE — Telephone Encounter (Signed)
Spoke with patient, she reported BP log below on amlodipine 2.5 mg daily. Rx on file at [facility] is for the 5 mg tablet. Recommend increasing back to 5 mg tablet daily. If any BP < 90/60, patient may cut tablet in half and resume 2.5 mg dose.  09/29/21 - 126/62, 68 09/30/21 - 145/67, 48 10/01/20 - 145/67, 68 10/03/21 - 133/61, 64 10/16/21 - 145/65, 75

## 2021-10-18 MED ORDER — AMLODIPINE BESYLATE 5 MG PO TABS: 5.0000 mg | ORAL_TABLET | Freq: Every day | ORAL | 3 refills | Status: DC

## 2021-10-18 NOTE — Telephone Encounter (Signed)
Agree with this. I have sent in amlodipine 5mg  daily. If BP drops, decrease amlodipine dose to 2.5mg  daily.

## 2021-11-01 ENCOUNTER — Encounter: Payer: Self-pay | Admitting: Family Medicine

## 2021-11-01 ENCOUNTER — Telehealth: Payer: Self-pay

## 2021-11-01 NOTE — Telephone Encounter (Signed)
Spiriva is long acting controller medication so 1 dose would likely not help. In no smoking history doubt spiriva would help either.  If ongoing shortness of breath, do recommend OV for further evaluation.

## 2021-11-01 NOTE — Telephone Encounter (Signed)
Spoke with pt's husband, Barnabas Lister (on dpr), relaying Dr. Synthia Innocent message.  He verbalizes understanding but is not able to schedule OV now.  Says he will have to call back on 11/05/21 to make an appt. Fyi to Dr. Darnell Level.

## 2021-11-01 NOTE — Telephone Encounter (Signed)
I spoke with pt's husband (DPR signed) see pt message from 11/01/21 also.pt has SOB upon exertion for a while and that is not a new symptom. This morning pulse ox 96%.. pt had a stroke 2 months ago but still has low energy level but occasional has arm weakness on and off but pts husband said she is doing OK. No CP or wheezing. Pts husband has spiriva and wants to know if pt could use one time to see if that would help SOB. Mr Bartling said no new symptoms but just wants to see if could use the spiriva to see if would help 'SOB. Pts husband said she does not need an appt to be seen just wants Dr Timoteo Expose advice if OK to try the bronchodilator. UC & ED precautions given to pts husband and he voiced understanding and said that would be just common sense. Mr Blaney said again no new symptoms but pt has been doing usual outside work but seems more SOB than normal and pts husband wonders if Spiriva would help and pt does not need appt.  sending note to DR Reece Agar and Misty Stanley CMA and will teams Misty Stanley also.

## 2021-11-01 NOTE — Telephone Encounter (Signed)
Plz triage pt.  °

## 2021-11-06 ENCOUNTER — Other Ambulatory Visit: Payer: Self-pay

## 2021-11-06 ENCOUNTER — Encounter: Payer: Self-pay | Admitting: Family Medicine

## 2021-11-06 ENCOUNTER — Ambulatory Visit (INDEPENDENT_AMBULATORY_CARE_PROVIDER_SITE_OTHER): Payer: PPO | Admitting: Family Medicine

## 2021-11-06 ENCOUNTER — Telehealth: Payer: Self-pay | Admitting: Family Medicine

## 2021-11-06 ENCOUNTER — Ambulatory Visit (INDEPENDENT_AMBULATORY_CARE_PROVIDER_SITE_OTHER)
Admission: RE | Admit: 2021-11-06 | Discharge: 2021-11-06 | Disposition: A | Discharge status: Home / Self Care | Payer: PPO | Source: Ambulatory Visit | Attending: Family Medicine | Admitting: Family Medicine

## 2021-11-06 VITALS — BP 140/68 | HR 70 | Temp 98.0°F | Ht 62.0 in | Wt 144.1 lb

## 2021-11-06 DIAGNOSIS — R0609 Other forms of dyspnea: Secondary | ICD-10-CM

## 2021-11-06 DIAGNOSIS — K219 Gastro-esophageal reflux disease without esophagitis: Secondary | ICD-10-CM | POA: Diagnosis not present

## 2021-11-06 DIAGNOSIS — G4733 Obstructive sleep apnea (adult) (pediatric): Secondary | ICD-10-CM | POA: Diagnosis not present

## 2021-11-06 DIAGNOSIS — I10 Essential (primary) hypertension: Secondary | ICD-10-CM

## 2021-11-06 DIAGNOSIS — Z9989 Dependence on other enabling machines and devices: Secondary | ICD-10-CM

## 2021-11-06 DIAGNOSIS — Z8673 Personal history of transient ischemic attack (TIA), and cerebral infarction without residual deficits: Secondary | ICD-10-CM

## 2021-11-06 LAB — BASIC METABOLIC PANEL
BUN: 21 mg/dL (ref 6–23)
CO2: 30 mEq/L (ref 19–32)
Calcium: 9.7 mg/dL (ref 8.4–10.5)
Chloride: 108 mEq/L (ref 96–112)
Creatinine, Ser: 0.88 mg/dL (ref 0.40–1.20)
GFR: 63.48 mL/min (ref >60.00)
Glucose, Bld: 74 mg/dL (ref 70–99)
Potassium: 3.8 mEq/L (ref 3.5–5.1)
Sodium: 144 mEq/L (ref 135–145)

## 2021-11-06 LAB — T4, FREE: Free T4: 0.64 ng/dL (ref 0.60–1.60)

## 2021-11-06 LAB — CBC WITH DIFFERENTIAL/PLATELET
Basophils Absolute: 0 10*3/uL (ref 0.0–0.1)
Basophils Relative: 0.6 % (ref 0.0–3.0)
Eosinophils Absolute: 0.2 10*3/uL (ref 0.0–0.7)
Eosinophils Relative: 2.9 % (ref 0.0–5.0)
HCT: 39.2 % (ref 36.0–46.0)
Hemoglobin: 12.8 g/dL (ref 12.0–15.0)
Lymphocytes Relative: 37.3 % (ref 12.0–46.0)
Lymphs Abs: 2 10*3/uL (ref 0.7–4.0)
MCHC: 32.7 g/dL (ref 30.0–36.0)
MCV: 93.1 fl (ref 78.0–100.0)
Monocytes Absolute: 0.5 10*3/uL (ref 0.1–1.0)
Monocytes Relative: 9.8 % (ref 3.0–12.0)
Neutro Abs: 2.6 10*3/uL (ref 1.4–7.7)
Neutrophils Relative %: 49.4 % (ref 43.0–77.0)
Platelets: 210 10*3/uL (ref 150.0–400.0)
RBC: 4.21 Mil/uL (ref 3.87–5.11)
RDW: 13.5 % (ref 11.5–15.5)
WBC: 5.3 10*3/uL (ref 4.0–10.5)

## 2021-11-06 LAB — TSH: TSH: 0.51 u[IU]/mL (ref 0.35–5.50)

## 2021-11-06 MED ORDER — MECLIZINE HCL 25 MG PO TABS: 25.0000 mg | ORAL_TABLET | Freq: Three times a day (TID) | ORAL | 0 refills | Status: DC | PRN

## 2021-11-06 NOTE — Patient Instructions (Addendum)
Labs today  Chest xray today Ambulatory pulse ox today We will be in touch with results.   Shortness of Breath, Adult Shortness of breath is when a person has trouble breathing or when a person feels like she or he is having trouble breathing in enough air. Shortness of breath could be a sign of a medical problem. Follow these instructions at home: Pollutants Do not use any products that contain nicotine or tobacco. These products include cigarettes, chewing tobacco, and vaping devices, such as e-cigarettes. This also includes cigars and pipes. If you need help quitting, ask your health care provider. Avoid things that can irritate your airways, including: Smoke. This includes campfire smoke, forest fire smoke, and secondhand smoke from tobacco products. Do not smoke or allow others to smoke in your home. Mold. Dust. Air pollution. Chemical fumes. Things that can give you an allergic reaction (allergens) if you have allergies. Common allergens include pollen from grasses or trees and animal dander. Keep your living space clean and free of mold and dust. General instructions Pay attention to any changes in your symptoms. Take over-the-counter and prescription medicines only as told by your health care provider. This includes oxygen therapy and inhaled medicines. Rest as needed. Return to your normal activities as told by your health care provider. Ask your health care provider what activities are safe for you. Keep all follow-up visits. This is important. Contact a health care provider if: Your condition does not improve as soon as expected. You have a hard time doing your normal activities, even after you rest. You have new symptoms. You cannot walk up stairs or exercise the way that you normally do. Get help right away if: Your shortness of breath gets worse. You have shortness of breath when you are resting. You feel light-headed or you faint. You have a cough that is not  controlled with medicines. You cough up blood. You have pain with breathing. You have pain in your chest, arms, shoulders, or abdomen. You have a fever. These symptoms may be an emergency. Get help right away. Call 911. Do not wait to see if the symptoms will go away. Do not drive yourself to the hospital. Summary Shortness of breath is when a person has trouble breathing enough air. It can be a sign of a medical problem. Avoid things that irritate your lungs, such as smoking, pollution, mold, and dust. Pay attention to changes in your symptoms and contact your health care provider if you have a hard time completing daily activities because of shortness of breath. This information is not intended to replace advice given to you by your health care provider. Make sure you discuss any questions you have with your health care provider. Document Revised: 04/14/2021 Document Reviewed: 04/14/2021 Elsevier Patient Education  [place].

## 2021-11-06 NOTE — Telephone Encounter (Signed)
E-scribed meclizine to [facility].

## 2021-11-06 NOTE — Progress Notes (Signed)
Patient ID: Kathy Baldwin, female    DOB: [birth year], 76 y.o.   MRN: [id number]  This visit was conducted in person.  BP 140/68    Pulse 70    Temp 98 F (36.7 C) (Temporal)    Ht 5\' 2"  (1.575 m)    Wt 144 lb 2 oz (65.4 kg)    SpO2 97%    BMI 26.36 kg/m    CC: fatigue  Subjective:   HPI: Kathy Baldwin is a 76 y.o. female presenting on 11/06/2021 for Fatigue (C/o low energy, SOB, pressure in forehead and dizziness.  Sxs started about 6 wks ago.  Pt accompanied by husband, 11/08/2021. )   6 wk h/o dyspnea and fatigue since stroke 09/2021. No wheezing, cough. Occasionally feels lightheaded but without vertigo. No palpitations.  Notes O2 sat running low 90s, as well as easy exertional dyspnea (ie working in the yard, cleaning the house, doing the laundry).   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 MRI with old cerebral infarcts, no acute findings. CT angio/echo reassuring. Noted proximal R ICA plaque <50% stenosis. S/p DAPT with aspirin, now only on plavix indefinitely. COVID negative during that hospitalization.   Recently saw cardiology and neurology this past month with reassuring evaluations.  Known OSA, regularly on CPAP.   Non smoker. No significant second hand smoke exposure. No known history of asthma/COPD. Seasonal allergies.  No known asbestos exposure.  Father with black lung (coal miner).  Sister with emphysema (smoker).  No known COVID infection.  She does take vaginal estrogen (premarin) - started by Dr 10/2021.  No h/o blood clots, no recent prolonged car or plane rides, no unilateral leg swelling.      Relevant past medical, surgical, family and social history reviewed and updated as indicated. Interim medical history since our last visit reviewed. Allergies and medications reviewed and updated. Outpatient Medications Prior to Visit  Medication Sig Dispense Refill   amLODipine (NORVASC) 5 MG tablet Take 1 tablet (5 mg total) by mouth daily. 90 tablet  3   aspirin EC 81 MG EC tablet Take 1 tablet (81 mg total) by mouth daily. Swallow whole. 21 tablet 0   Calcium Carb-Cholecalciferol (CALCIUM 600+D) 600-800 MG-UNIT TABS Take by mouth daily. 2 capsule daily     clopidogrel (PLAVIX) 75 MG tablet Take 1 tablet (75 mg total) by mouth daily. 90 tablet 0   COLLAGEN PO Take by mouth.     conjugated estrogens (PREMARIN) vaginal cream Use a pea-sized amount externally and internally twice a week at night 42.5 g 1   Cyanocobalamin (B-12) 2500 MCG TABS Take 1 tablet by mouth daily. Pt taking 1/2 of a 5000 mcg tablet     donepezil (ARICEPT) 10 MG tablet Take 10 mg by mouth at bedtime.      famotidine (PEPCID) 20 MG tablet Take 1 tablet (20 mg total) by mouth 2 (two) times daily as needed for heartburn or indigestion. 180 tablet 3   FIBER PO Take by mouth.     fluticasone (FLONASE) 50 MCG/ACT nasal spray SHAKE LIQUID AND USE 2 SPRAYS IN EACH NOSTRIL DAILY 48 g 2   memantine (NAMENDA) 10 MG tablet Take 10 mg by mouth 2 (two) times daily.     mirabegron ER (MYRBETRIQ) 25 MG TB24 tablet Take 1 tablet (25 mg total) by mouth daily. 90 tablet 2   Multiple Vitamin (MULTIVITAMIN) tablet Take 1 tablet by mouth daily.     Multiple  Vitamins-Minerals (HAIR/SKIN/NAILS/BIOTIN PO) Take 1 tablet by mouth daily.     pantoprazole (PROTONIX) 40 MG tablet Take 1 tablet (40 mg total) by mouth daily as needed (GERD symptoms). 90 tablet 3   PEDIASURE/FIBER (PEDIASURE/FIBER) LIQD Take 10 mLs by mouth.     polyethylene glycol (MIRALAX / GLYCOLAX) packet Take 17 g by mouth daily.     rosuvastatin (CRESTOR) 10 MG tablet TAKE 1 TABLET(10 MG) BY MOUTH AT BEDTIME 90 tablet 3   meclizine (ANTIVERT) 25 MG tablet Take 25 mg by mouth 3 (three) times daily as needed for dizziness.     No facility-administered medications prior to visit.     Per HPI unless specifically indicated in ROS section below Review of Systems  Objective:  BP 140/68    Pulse 70    Temp 98 F (36.7 C) (Temporal)     Ht 5\' 2"  (1.575 m)    Wt 144 lb 2 oz (65.4 kg)    SpO2 97%    BMI 26.36 kg/m   Wt Readings from Last 3 Encounters:  11/06/21 144 lb 2 oz (65.4 kg)  09/26/21 138 lb 4 oz (62.7 kg)  09/20/21 141 lb 4 oz (64.1 kg)      Physical Exam Vitals and nursing note reviewed.  Constitutional:      Appearance: Normal appearance. She is not ill-appearing.  HENT:     Head: Normocephalic and atraumatic.     Mouth/Throat:     Mouth: Mucous membranes are moist.     Pharynx: Oropharynx is clear. No oropharyngeal exudate or posterior oropharyngeal erythema.  Eyes:     Extraocular Movements: Extraocular movements intact.     Conjunctiva/sclera: Conjunctivae normal.     Pupils: Pupils are equal, round, and reactive to light.  Cardiovascular:     Rate and Rhythm: Normal rate and regular rhythm.     Pulses: Normal pulses.     Heart sounds: Normal heart sounds. No murmur heard. Pulmonary:     Effort: Pulmonary effort is normal. No respiratory distress.     Breath sounds: Normal breath sounds. No wheezing or rhonchi.  Musculoskeletal:     Cervical back: Normal range of motion and neck supple. No rigidity.     Right lower leg: No edema.     Left lower leg: No edema.  Lymphadenopathy:     Cervical: No cervical adenopathy.  Skin:    General: Skin is warm and dry.     Findings: No rash.  Neurological:     Mental Status: She is alert.  Psychiatric:        Mood and Affect: Mood normal.        Behavior: Behavior normal.      Results for orders placed or performed in visit on 11/06/21  Basic metabolic panel  Result Value Ref Range   Sodium 144 135 - 145 mEq/L   Potassium 3.8 3.5 - 5.1 mEq/L   Chloride 108 96 - 112 mEq/L   CO2 30 19 - 32 mEq/L   Glucose, Bld 74 70 - 99 mg/dL   BUN 21 6 - 23 mg/dL   Creatinine, Ser 9.67 0.40 - 1.20 mg/dL   GFR 89.38 >10.17 mL/min   Calcium 9.7 8.4 - 10.5 mg/dL  CBC with Differential/Platelet  Result Value Ref Range   WBC 5.3 4.0 - 10.5 K/uL   RBC 4.21 3.87 -  5.11 Mil/uL   Hemoglobin 12.8 12.0 - 15.0 g/dL   HCT 51.0 25.8 - 52.7 %  MCV 93.1 78.0 - 100.0 fl   MCHC 32.7 30.0 - 36.0 g/dL   RDW 83.0 94.0 - 76.8 %   Platelets 210.0 150.0 - 400.0 K/uL   Neutrophils Relative % 49.4 43.0 - 77.0 %   Lymphocytes Relative 37.3 12.0 - 46.0 %   Monocytes Relative 9.8 3.0 - 12.0 %   Eosinophils Relative 2.9 0.0 - 5.0 %   Basophils Relative 0.6 0.0 - 3.0 %   Neutro Abs 2.6 1.4 - 7.7 K/uL   Lymphs Abs 2.0 0.7 - 4.0 K/uL   Monocytes Absolute 0.5 0.1 - 1.0 K/uL   Eosinophils Absolute 0.2 0.0 - 0.7 K/uL   Basophils Absolute 0.0 0.0 - 0.1 K/uL  TSH  Result Value Ref Range   TSH 0.51 0.35 - 5.50 uIU/mL  T4, free  Result Value Ref Range   Free T4 0.64 0.60 - 1.60 ng/dL    Assessment & Plan:  This visit occurred during the SARS-CoV-2 public health emergency.  Safety protocols were in place, including screening questions prior to the visit, additional usage of staff PPE, and extensive cleaning of exam room while observing appropriate contact time as indicated for disinfecting solutions.   Problem List Items Addressed This Visit     Hypertension    BP stable on current regimen of amlodipine 5mg  daily.       GERD (gastroesophageal reflux disease)    This is managed with daily PPI and BID pepcid.       OSA on CPAP    She is regular with her CPAP use      History of cerebellar stroke   Exertional dyspnea - Primary    Ongoing fatigue and exertional dyspnea since cerebellar stroke 09/2021. Check labs for reversible causes including D dimer, CBC, TSH, check CXR. If ongoing, discussed likely pulmonology referral for further evaluation, consider PFTs.  Normal ambulatory pulse ox today with nadir 95% on room air.       Relevant Orders   DG Chest 2 View   Basic metabolic panel (Completed)   CBC with Differential/Platelet (Completed)   TSH (Completed)   T4, free (Completed)   D-dimer, quantitative     Meds ordered this encounter  Medications    DISCONTD: meclizine (ANTIVERT) 25 MG tablet    Sig: Take 1 tablet (25 mg total) by mouth 3 (three) times daily as needed for dizziness.    Dispense:  30 tablet    Refill:  0   Orders Placed This Encounter  Procedures   DG Chest 2 View    Standing Status:   Future    Number of Occurrences:   1    Standing Expiration Date:   11/06/2022    Order Specific Question:   Reason for Exam (SYMPTOM  OR DIAGNOSIS REQUIRED)    Answer:   exertional dyspnea x 6 wks    Order Specific Question:   Preferred imaging location?    Answer:   11/08/2022 [facility]   Basic metabolic panel   CBC with Differential/Platelet   TSH   T4, free   D-dimer, quantitative     Patient instructions: Labs today  Chest xray today Ambulatory pulse ox today We will be in touch with results.   Follow up plan: Return if symptoms worsen or fail to improve.  Justice Britain, MD

## 2021-11-06 NOTE — Telephone Encounter (Signed)
Mr. [name] stated that they need to have her medication changed to [facility] they have a prescription there already

## 2021-11-06 NOTE — Addendum Note (Signed)
Addended by: Nanci Pina on: 11/06/2021 11:21 AM   Modules accepted: Orders

## 2021-11-07 ENCOUNTER — Other Ambulatory Visit: Payer: Self-pay | Admitting: Family Medicine

## 2021-11-07 DIAGNOSIS — R079 Chest pain, unspecified: Secondary | ICD-10-CM

## 2021-11-07 LAB — D-DIMER, QUANTITATIVE: D-DIMER: 0.94 mg/L FEU — ABNORMAL HIGH (ref 0.00–0.49)

## 2021-11-07 NOTE — Assessment & Plan Note (Signed)
She is regular with her CPAP use ?

## 2021-11-07 NOTE — Assessment & Plan Note (Signed)
This is managed with daily PPI and BID pepcid.  ?

## 2021-11-07 NOTE — Assessment & Plan Note (Addendum)
Ongoing fatigue and exertional dyspnea since cerebellar stroke 09/2021. Check labs for reversible causes including D dimer, CBC, TSH, check CXR. If ongoing, discussed likely pulmonology referral for further evaluation, consider PFTs.  ?Normal ambulatory pulse ox today with nadir 95% on room air.  ?

## 2021-11-07 NOTE — Assessment & Plan Note (Addendum)
BP stable on current regimen of amlodipine 5mg  daily.  ?

## 2021-11-08 ENCOUNTER — Ambulatory Visit
Admission: RE | Admit: 2021-11-08 | Discharge: 2021-11-08 | Disposition: A | Discharge status: Home / Self Care | Payer: PPO | Source: Ambulatory Visit | Attending: Family Medicine | Admitting: Family Medicine

## 2021-11-08 DIAGNOSIS — R079 Chest pain, unspecified: Secondary | ICD-10-CM | POA: Diagnosis present

## 2021-11-08 MED ORDER — IOHEXOL 300 MG/ML  SOLN
75.0000 mL | Freq: Once | INTRAMUSCULAR | Status: AC | PRN
  Administered 2021-11-08: 75 mL via INTRAVENOUS

## 2021-11-09 ENCOUNTER — Encounter: Payer: Self-pay | Admitting: Family Medicine

## 2021-11-09 ENCOUNTER — Other Ambulatory Visit: Payer: Self-pay | Admitting: Family Medicine

## 2021-11-09 DIAGNOSIS — R0609 Other forms of dyspnea: Secondary | ICD-10-CM

## 2021-11-09 DIAGNOSIS — J984 Other disorders of lung: Secondary | ICD-10-CM | POA: Insufficient documentation

## 2021-11-09 DIAGNOSIS — J45909 Unspecified asthma, uncomplicated: Secondary | ICD-10-CM | POA: Insufficient documentation

## 2021-11-09 MED ORDER — FLUTICASONE-SALMETEROL 250-50 MCG/ACT IN AEPB: 1.0000 | INHALATION_SPRAY | Freq: Two times a day (BID) | RESPIRATORY_TRACT | 3 refills | Status: DC

## 2021-11-19 ENCOUNTER — Ambulatory Visit: Payer: PPO | Admitting: Pulmonary Disease

## 2021-11-19 ENCOUNTER — Encounter: Payer: Self-pay | Admitting: Pulmonary Disease

## 2021-11-19 ENCOUNTER — Other Ambulatory Visit: Payer: Self-pay

## 2021-11-19 VITALS — BP 110/80 | HR 63 | Temp 97.1°F | Ht 63.0 in | Wt 141.0 lb

## 2021-11-19 DIAGNOSIS — J984 Other disorders of lung: Secondary | ICD-10-CM | POA: Diagnosis not present

## 2021-11-19 DIAGNOSIS — Z9989 Dependence on other enabling machines and devices: Secondary | ICD-10-CM | POA: Diagnosis not present

## 2021-11-19 DIAGNOSIS — R0602 Shortness of breath: Secondary | ICD-10-CM | POA: Diagnosis not present

## 2021-11-19 DIAGNOSIS — G4733 Obstructive sleep apnea (adult) (pediatric): Secondary | ICD-10-CM | POA: Diagnosis not present

## 2021-11-19 NOTE — Patient Instructions (Signed)
Continue using the Wixela twice a day for now. ? ?We are going to get an oxygen level on your CPAP at nighttime. ? ?We are also going to get breathing tests. ? ?We will see you back in 4 to 6 weeks time call sooner should any new problems arise. ?

## 2021-11-19 NOTE — Progress Notes (Signed)
Subjective:    Patient ID: Kathy Baldwin, female    DOB: 10-05-1945, 76 y.o.   MRN: [id number]  Patient Care Team: Ria Bush, MD as PCP - General (Family Medicine) Lucilla Lame, MD as Consulting Physician (Gastroenterology) [place], [place] ([facility]) Solum, Betsey Holiday, MD as Physician Assistant (Endocrinology) Beverly Gust, MD as Consulting Physician (Otolaryngology) Leandrew Koyanagi, MD as Referring Physician (Ophthalmology) Debbora Dus, [facility] as Pharmacist (Pharmacist)   Chief Complaint  Patient presents with   Consult   HPI The patient is a 76 year old lifelong never smoker who presents for evaluation of dyspnea and fatigue with onset around the time of his stroke in January 2023.  She is kindly furred by Dr. Ria Bush.  Patient has some mild memory impairment so she is accompanied today by her husband, Kathy Baldwin.  She states that in reality she started having some dyspnea on exertion around 6 to 8 months ago.  Notices it more when she was doing her gardening chores.  She states that stress and exertion make it worse and rest makes it better.  It is exacerbated by "more stress".  On 11 September 2021 she had a stroke and since then has noted fatigue.  This was manifested by left arm weakness. Received tPA with resolution of her stroke symptoms.  She has a history of obstructive sleep apnea diagnosed approximately 7 years ago and is on CPAP through [facility].  Her current machine is approximately 78 years old.  Dr. Danise Mina manages this issue.  She states that she is compliant with the therapy.  She has not had any cough, no wheezing, no fevers, chills or sweats.  Because of her complaint of dyspnea Dr. Danise Mina did a initial workup and a D-dimer was ordered.  This showed some mild elevation so a CT angio ensued.  CT angio did not show PE however did show some evidence of small airways disease such as can be seen with asthma.  The patient does not endorse any other  symptomatology.  She is retired with no significant occupational exposure in the past.  She is currently on Wixela (Advair generic) 250/50, this was started recently.  Feels that it may be helping somewhat.   Review of Systems A 10 point review of systems was performed and it is as noted above otherwise negative.  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 Sabino Gasser 2010   DNR no code (do not resuscitate) 03/20/2017   GERD (gastroesophageal reflux disease)    Hearing loss    History of gallstones    Hyperlipidemia    Hypertension    Hypertrophy of nasal turbinates    Leg mass, left    in calf muscle seeing surgeon tomorrow regarding   Lymphocytosis    Menopause age 63   Multiple food allergies    chocolate and cabbage   OSA on CPAP    Osteopenia Oct. 2014   Sleep apnea    Stroke ([place]) 09/11/2021   Thyroid nodule    Vertigo    Vitamin D deficiency disease    Past Surgical History:  Procedure Laterality Date   BREAST CYST ASPIRATION Right    negative   CARPAL TUNNEL RELEASE Right  2015   CATARACT EXTRACTION W/PHACO Left 09/30/2018   Procedure: CATARACT EXTRACTION PHACO AND INTRAOCULAR LENS PLACEMENT ([place])  LEFT;  Surgeon: Leandrew Koyanagi, MD;  Location: [facility];  Service: Ophthalmology;  Laterality: Left;  sleep apnea   CATARACT EXTRACTION W/PHACO Right 10/28/2018   Procedure: CATARACT EXTRACTION PHACO AND INTRAOCULAR LENS PLACEMENT ([place])  RIGHT;  Surgeon: Leandrew Koyanagi, MD;  Location: [facility];  Service: Ophthalmology;  Laterality: Right;   CHOLECYSTECTOMY  1990   COLONOSCOPY  04/25/2008   CYST EXCISION Left 2020   L popliteal/peroneal cyst excision s/p residual numbness   DILATION AND  CURETTAGE OF UTERUS  1982   s/p miscarriage   ESOPHAGOGASTRODUODENOSCOPY  08/2020   chronic gastritis, neg H pylori, no Barretts, no rpt needed [facility])   Patient Active Problem List   Diagnosis Date Noted   Small airways disease 11/09/2021   Exertional dyspnea 11/06/2021   Other fatigue 09/26/2021   Left-sided weakness    Stroke ([place]) 09/11/2021   Bradycardia 04/27/2021   Left hip pain 03/14/2021   Urge urinary incontinence 03/06/2021   Medicare annual wellness visit, subsequent 03/05/2021   Advanced directives, counseling/discussion 03/05/2021   Seborrheic keratosis 12/13/2020   Abdominal bloating 05/12/2020   Syncope 03/31/2020   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   Mass of leg, left 10/23/2018   History of cerebellar stroke 10/08/2018   Small vessel disease, 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   Carotid stenosis, bilateral 10/08/2018   Thyroid nodule 04/14/2018   DNR no code (do not resuscitate) 03/20/2017   Vaginal atrophy 02/18/2017   Aortic atherosclerosis ([place]) 09/05/2016   Decreased hearing, bilateral 12/04/2015   Health maintenance examination 12/04/2015   Hypertension    ASCUS with positive high risk human papillomavirus of vagina    GERD (gastroesophageal reflux disease)    OSA on CPAP    Hypertrophy of nasal turbinates    Hyperlipidemia 03/28/2015   Allergic rhinitis 03/28/2015   History of decompression of median nerve 02/14/2014   Osteopenia 06/09/2013   Family History  Problem Relation Age of Onset   Healthy Mother    Arthritis Mother    Lung disease Father        black lung   Heart disease Father    Hyperlipidemia Father    Hypertension Father    Emphysema Sister    Healthy Sister    Cancer Brother        unsure of type   Asthma Daughter    Heart disease Sister    Thyroid disease Sister    Stroke Maternal Grandmother    Hypertension Maternal Grandmother    Diabetes Maternal Grandmother    Diabetes Sister    Healthy Sister     Breast cancer Neg Hx    Social History   Tobacco Use   Smoking status: Never   Smokeless tobacco: Never   Tobacco comments:    Never smoked, so cannot quit...  Substance Use Topics   Alcohol use: Not Currently    Alcohol/week: 0.0 standard drinks    Comment: Occasional celebratory drink (holidays, etc.) only.   Allergies  Allergen Reactions   Cabbage Other (See Comments)    Hypersensitivity on allergy testing   Chocolate Other (See Comments)    Hypersensitivity on allergy testing   Statins Other (See Comments)    Zocor and Pravastatin--leg cramps Other reaction(s): Other (See Comments) Zocor and Pravastatin--leg cramps   Current Meds  Medication Sig   amLODipine (NORVASC) 5 MG tablet Take 1  tablet (5 mg total) by mouth daily.   aspirin EC 81 MG EC tablet Take 1 tablet (81 mg total) by mouth daily. Swallow whole.   Calcium Carb-Cholecalciferol (CALCIUM 600+D) 600-800 MG-UNIT TABS Take by mouth daily. 2 capsule daily   clopidogrel (PLAVIX) 75 MG tablet Take 1 tablet (75 mg total) by mouth daily.   COLLAGEN PO Take by mouth.   conjugated estrogens (PREMARIN) vaginal cream Use a pea-sized amount externally and internally twice a week at night   Cyanocobalamin (B-12) 2500 MCG TABS Take 1 tablet by mouth daily. Pt taking 1/2 of a 5000 mcg tablet   donepezil (ARICEPT) 10 MG tablet Take 10 mg by mouth at bedtime.    famotidine (PEPCID) 20 MG tablet Take 1 tablet (20 mg total) by mouth 2 (two) times daily as needed for heartburn or indigestion.   FIBER PO Take by mouth.   fluticasone (FLONASE) 50 MCG/ACT nasal spray SHAKE LIQUID AND USE 2 SPRAYS IN EACH NOSTRIL DAILY   fluticasone-salmeterol (WIXELA INHUB) 250-50 MCG/ACT AEPB Inhale 1 puff into the lungs in the morning and at bedtime.   meclizine (ANTIVERT) 25 MG tablet Take 1 tablet (25 mg total) by mouth 3 (three) times daily as needed for dizziness.   memantine (NAMENDA) 10 MG tablet Take 10 mg by mouth 2 (two) times daily.    mirabegron ER (MYRBETRIQ) 25 MG TB24 tablet Take 1 tablet (25 mg total) by mouth daily.   Multiple Vitamin (MULTIVITAMIN) tablet Take 1 tablet by mouth daily.   Multiple Vitamins-Minerals (HAIR/SKIN/NAILS/BIOTIN PO) Take 1 tablet by mouth daily.   pantoprazole (PROTONIX) 40 MG tablet Take 1 tablet (40 mg total) by mouth daily as needed (GERD symptoms).   PEDIASURE/FIBER (PEDIASURE/FIBER) LIQD Take 10 mLs by mouth.   polyethylene glycol (MIRALAX / GLYCOLAX) packet Take 17 g by mouth daily.   rosuvastatin (CRESTOR) 10 MG tablet TAKE 1 TABLET(10 MG) BY MOUTH AT BEDTIME   Immunization History  Administered Date(s) Administered   Fluad Quad(high Dose 65+) 05/12/2020   Influenza, High Dose Seasonal PF 06/27/2017, 05/21/2018   Influenza, Quadrivalent, Recombinant, Inj, Pf 07/05/2019   Influenza, Seasonal, Injecte, Preservative Fre 05/25/2015   Influenza-Unspecified 06/12/2011, 05/10/2014, 06/22/2016, 06/27/2017   Moderna Sars-Covid-2 Vaccination 10/22/2019, 11/19/2019, 07/03/2020   Pneumococcal Conjugate-13 11/30/2014   Pneumococcal Polysaccharide-23 08/13/2011, 10/01/2013   Td 06/09/2002   Tdap 09/10/2011, 08/13/2012   Zoster Recombinat (Shingrix) 04/27/2018, 06/29/2018   Zoster, Live 08/13/2012       Objective:   Physical Exam BP 110/80 (BP Location: Left Arm, Patient Position: Sitting, Cuff Size: Normal)   Pulse 63   Temp (!) 97.1 F (36.2 C) (Oral)   Ht 5\' 3"  (1.6 m)   Wt 141 lb (64 kg)   SpO2 97%   BMI 24.98 kg/m  GENERAL: Well-developed, well-nourished woman, no acute distress, fully ambulatory. HEAD: Normocephalic, atraumatic.  EYES: Pupils equal, round, reactive to light.  No scleral icterus.  MOUTH: Nose/mouth/throat not examined due to institutional masking for COVID-19. NECK: Supple. No thyromegaly. Trachea midline. No JVD.  No adenopathy. PULMONARY: Good air entry bilaterally.  No adventitious sounds. CARDIOVASCULAR: S1 and S2. Regular rate and rhythm.  No rubs, murmurs  or gallops heard. ABDOMEN: Benign. MUSCULOSKELETAL: No joint deformity, no clubbing, no edema.  NEUROLOGIC:  SKIN: Intact,warm,dry. PSYCH: Mildly befuddled.  Representative image from CT angio chest performed 08 November 2021 showing mild diffuse mosaic attenuation of the airspaces suggestive of small airways disease and cardiomegaly:      Ambulatory oximetry  was performed today: At rest heart rate was 60 bpm, resting O2 sat 97% maximum exercise O2 nadir 95%, heart rate 92 bpm.  Patient was able to ambulate 750 feet without any significant dyspnea.  Assessment & Plan:     ICD-10-CM   1. SOB (shortness of breath)  R06.02 Pulse oximetry, overnight    Pulmonary Function Test [facility] Only   Will obtain PFTs Continue Wixela for now    2. 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    3. OSA on CPAP  G47.33    Z99.89    Patient states compliant Will check ONO on CPAP     Orders Placed This Encounter  Procedures   Pulse oximetry, overnight    ON CPAP    Standing Status:   Future    Standing Expiration Date:   11/20/2022   Pulmonary Function Test [facility] Only    Standing Status:   Future    Number of Occurrences:   1    Standing Expiration Date:   11/20/2022    Order Specific Question:   Full PFT: includes the following: basic spirometry, spirometry pre & Baldwin bronchodilator, diffusion capacity (DLCO), lung volumes    Answer:   Full PFT    Order Specific Question:   This test can only be performed at    Answer:   [facility]   Will see the patient in follow-up 4 to 6 weeks time she is to contact us prior to that time should any new difficulties arise.  Renold Don, MD Advanced Bronchoscopy P[facility]    *This note was dictated using voice recognition software/Dragon.  Despite best efforts to proofread, errors can occur which can change the meaning. Any transcriptional errors that result from this process are  unintentional and may not be fully corrected at the time of dictation.

## 2021-12-03 ENCOUNTER — Telehealth: Payer: Self-pay

## 2021-12-03 NOTE — Chronic Care Management (AMB) (Signed)
? ? ?  Chronic Care Management ?Pharmacy Assistant  ? ?Name: Kathy Baldwin  MRN: [id number] DOB: [birth year] ? ?Reason for Encounter: Reminder Call ?  ?Conditions to be addressed/monitored: ?HTN and HLD ? ? ? ?Medications: ?Outpatient Encounter Medications as of 12/03/2021  ?Medication Sig  ? amLODipine (NORVASC) 5 MG tablet Take 1 tablet (5 mg total) by mouth daily.  ? aspirin EC 81 MG EC tablet Take 1 tablet (81 mg total) by mouth daily. Swallow whole.  ? Calcium Carb-Cholecalciferol (CALCIUM 600+D) 600-800 MG-UNIT TABS Take by mouth daily. 2 capsule daily  ? clopidogrel (PLAVIX) 75 MG tablet Take 1 tablet (75 mg total) by mouth daily.  ? COLLAGEN PO Take by mouth.  ? conjugated estrogens (PREMARIN) vaginal cream Use a pea-sized amount externally and internally twice a week at night  ? Cyanocobalamin (B-12) 2500 MCG TABS Take 1 tablet by mouth daily. Pt taking 1/2 of a 5000 mcg tablet  ? donepezil (ARICEPT) 10 MG tablet Take 10 mg by mouth at bedtime.   ? famotidine (PEPCID) 20 MG tablet Take 1 tablet (20 mg total) by mouth 2 (two) times daily as needed for heartburn or indigestion.  ? FIBER PO Take by mouth.  ? fluticasone (FLONASE) 50 MCG/ACT nasal spray SHAKE LIQUID AND USE 2 SPRAYS IN EACH NOSTRIL DAILY  ? fluticasone-salmeterol (WIXELA INHUB) 250-50 MCG/ACT AEPB Inhale 1 puff into the lungs in the morning and at bedtime.  ? meclizine (ANTIVERT) 25 MG tablet Take 1 tablet (25 mg total) by mouth 3 (three) times daily as needed for dizziness.  ? memantine (NAMENDA) 10 MG tablet Take 10 mg by mouth 2 (two) times daily.  ? mirabegron ER (MYRBETRIQ) 25 MG TB24 tablet Take 1 tablet (25 mg total) by mouth daily.  ? Multiple Vitamin (MULTIVITAMIN) tablet Take 1 tablet by mouth daily.  ? Multiple Vitamins-Minerals (HAIR/SKIN/NAILS/BIOTIN PO) Take 1 tablet by mouth daily.  ? pantoprazole (PROTONIX) 40 MG tablet Take 1 tablet (40 mg total) by mouth daily as needed (GERD symptoms).  ? PEDIASURE/FIBER (PEDIASURE/FIBER)  LIQD Take 10 mLs by mouth.  ? polyethylene glycol (MIRALAX / GLYCOLAX) packet Take 17 g by mouth daily.  ? rosuvastatin (CRESTOR) 10 MG tablet TAKE 1 TABLET(10 MG) BY MOUTH AT BEDTIME  ? ?No facility-administered encounter medications on file as of 12/03/2021.  ? ?Kathy Baldwin was contacted to remind of upcoming telephone visit with Charlene Brooke on 36/30/23 at 3:45pm. Patient was reminded to have any blood glucose and blood pressure readings available for review at appointment.  ? ?Message was left reminding patient of appointment. ? ? ?Star Rating Drugs: ?Medication:  Last Fill: Day Supply ?Rosuvastatin 10mg  09/18/21 90 ? ?Charlene Brooke, CPP notified ? ?Chairty Toman, CCMA ?Health concierge  ?[phone]  ?  ?

## 2021-12-06 ENCOUNTER — Ambulatory Visit (INDEPENDENT_AMBULATORY_CARE_PROVIDER_SITE_OTHER): Payer: PPO | Admitting: Pharmacist

## 2021-12-06 DIAGNOSIS — I10 Essential (primary) hypertension: Secondary | ICD-10-CM

## 2021-12-06 DIAGNOSIS — I700 Atherosclerosis of aorta: Secondary | ICD-10-CM

## 2021-12-06 DIAGNOSIS — K219 Gastro-esophageal reflux disease without esophagitis: Secondary | ICD-10-CM

## 2021-12-06 DIAGNOSIS — E782 Mixed hyperlipidemia: Secondary | ICD-10-CM

## 2021-12-06 DIAGNOSIS — Z8673 Personal history of transient ischemic attack (TIA), and cerebral infarction without residual deficits: Secondary | ICD-10-CM

## 2021-12-06 NOTE — Progress Notes (Signed)
? ?Chronic Care Management ?Pharmacy Note ? ?12/06/2021 ?Name:  Kathy Baldwin MRN:  [id number] DOB:  02-May-1946 ? ?Summary: CCM F/U visit ?-Pt is still taking aspirin and Plavix after stroke in January. Original plan was 3 wks of DAPT followed by Plavix monotherapy. Per chart review, pt saw cardiology 11/02/21 and advised continuing aspirin and Plavix. ?-Pt reports SOB improved on Wixela. She has PFTs this week ? ?Recommendations/Changes made from today's visit: ?-No med changes ? ?Plan: ?-[place] will call patient 1 month for adherence update ?-Pharmacist follow up televisit scheduled for 3 months ? ? ? ?Subjective: ?Kathy Baldwin is an 76 y.o. year old female who is a primary patient of Ria Bush, MD.  The CCM team was consulted for assistance with disease management and care coordination needs.   ? ?Engaged with patient by telephone for follow up visit in response to provider referral for pharmacy case management and/or care coordination services.  ? ?Consent to Services:  ?The patient was given information about Chronic Care Management services, agreed to services, and gave verbal consent prior to initiation of services.  Please see initial visit note for detailed documentation.  ? ?Patient Care Team: ?Ria Bush, MD as PCP - General (Family Medicine) ?Lucilla Lame, MD as Consulting Physician (Gastroenterology) ?Pa, [place] [facility]) ?Judi Cong, MD as Physician Assistant (Endocrinology) ?Beverly Gust, MD as Consulting Physician (Otolaryngology) ?Leandrew Koyanagi, MD as Referring Physician (Ophthalmology) ?Arif Amendola, [place], [facility] as Pharmacist (Pharmacist) ? ?Recent office visits: ?11/06/21 Dr Danise Mina OV: fatigue, DOE. Order CT to r/o PE. CT negative for PE but showed possible COPD. Rx'd Wixela 1 puff BID. ? ?10/16/21 PCP phone note - increase amlodipine to 5 mg. If BP drops, reduce back to 2.5 mg  ? ?09/26/21 Dr Danise Mina OV: hospital f/u (CVA);  restart amlodipine 2.5. DAPT x 3 wks then continue daily plavix only. ? ?09/17/21 pt message: omeprazole switched to pantoprazole d/t DDI with Plavix. ? ?Recent consult visits: ?11/19/21 Dr Patsey Berthold (Pulmonary): consult SOB. Continue Wixela. Order PFT. ? ?11/02/21 PA Jettie Booze (Cardiology): continue aspirin, plavix for hx PVD, AA, carotid stenosis. ? ?10/24/21 Dr Melrose Nakayama (neurology): f/u memory loss. Refill donepezil, Namenda. ? ?Hospital visits: ?Medication Reconciliation was completed by comparing discharge summary, patient?s EMR and Pharmacy list, and upon discussion with patient. ? ?Admitted to the hospital on 09/11/21 due to Stroke. Discharge date was 09/13/21. Discharged from [facility].   ? ?New?Medications Started at [facility] Discharge:?? ?-started aspirin and Plavix due to stroke ? ?Medications Discontinued at Hospital Discharge: ?-Stopped amlodipine, naproxen, omeprazol ? ?Medications that remain the same after Hospital Discharge:??  ?-All other medications will remain the same.   ? ? ?Objective: ? ?Lab Results  ?Component Value Date  ? CREATININE 0.88 11/06/2021  ? BUN 21 11/06/2021  ? GFR 63.48 11/06/2021  ? GFRNONAA >60 09/13/2021  ? GFRAA 87 10/08/2018  ? NA 144 11/06/2021  ? K 3.8 11/06/2021  ? CALCIUM 9.7 11/06/2021  ? CO2 30 11/06/2021  ? GLUCOSE 74 11/06/2021  ? ? ?Lab Results  ?Component Value Date/Time  ? HGBA1C 5.6 09/11/2021 07:20 PM  ? GFR 63.48 11/06/2021 10:15 AM  ? GFR 70.46 02/26/2021 07:59 AM  ? MICROALBUR <0.7 02/26/2021 07:59 AM  ?  ?Last diabetic Eye exam: No results found for: HMDIABEYEEXA  ?Last diabetic Foot exam: No results found for: HMDIABFOOTEX  ? ?Lab Results  ?Component Value Date  ? CHOL 170 09/11/2021  ? HDL 92 09/11/2021  ? [place] 66  09/11/2021  ? TRIG 60 09/11/2021  ? CHOLHDL 1.8 09/11/2021  ? ? ? ?  Latest Ref Rng & Units 09/11/2021  ?  8:57 AM 02/26/2021  ?  7:59 AM 05/12/2020  ?  1:49 PM  ?Hepatic Function  ?Total Protein 6.5 - 8.1 g/dL 7.5   6.9   6.9    ?Albumin 3.5 - 5.0  g/dL 4.3   4.5   4.2    ?AST 15 - 41 U/L 36   32   31    ?ALT 0 - 44 U/L '20   21   17    ' ?Alk Phosphatase 38 - 126 U/L 80   69   63    ?Total Bilirubin 0.3 - 1.2 mg/dL 1.0   0.9   0.8    ?Bilirubin, Direct 0.0 - 0.3 mg/dL   0.1    ? ? ?Lab Results  ?Component Value Date/Time  ? TSH 0.51 11/06/2021 10:15 AM  ? TSH 0.33 (L) 09/26/2021 10:27 AM  ? FREET4 0.64 11/06/2021 10:15 AM  ? FREET4 0.72 09/26/2021 03:20 PM  ? ? ? ?  Latest Ref Rng & Units 11/06/2021  ? 10:15 AM 09/13/2021  ?  6:17 AM 09/12/2021  ?  5:26 AM  ?CBC  ?WBC 4.0 - 10.5 K/uL 5.3   7.8   6.1    ?Hemoglobin 12.0 - 15.0 g/dL 12.8   13.2   13.2    ?Hematocrit 36.0 - 46.0 % 39.2   39.8   40.6    ?Platelets 150.0 - 400.0 K/uL 210.0   221   216    ? ? ?Lab Results  ?Component Value Date/Time  ? VD25OH 61.54 09/26/2021 10:27 AM  ? VD25OH 61.25 02/26/2021 07:59 AM  ? ? ?Clinical ASCVD: Yes  ?The ASCVD Risk score (Arnett DK, et al., 2019) failed to calculate for the following reasons: ?  The patient has a prior MI or stroke diagnosis   ? ? ?  03/05/2021  ?  9:55 AM 08/17/2019  ? 12:13 PM 04/20/2019  ?  2:48 PM  ?Depression screen PHQ 2/9  ?Decreased Interest 0 0 0  ?Down, Depressed, Hopeless 0 0 0  ?PHQ - 2 Score 0 0 0  ?Altered sleeping 0 0   ?Tired, decreased energy 0 1   ?Change in appetite 0 0   ?Feeling bad or failure about yourself  0 0   ?Trouble concentrating 0 0   ?Moving slowly or fidgety/restless 0 0   ?Suicidal thoughts 0 0   ?PHQ-9 Score 0 1   ?  ? ? ?Social History  ? ?Tobacco Use  ?Smoking Status Never  ?Smokeless Tobacco Never  ?Tobacco Comments  ? Never smoked, so cannot quit...  ? ?BP Readings from Last 3 Encounters:  ?11/19/21 110/80  ?11/06/21 140/68  ?09/26/21 (!) 156/70  ? ?Pulse Readings from Last 3 Encounters:  ?11/19/21 63  ?11/06/21 70  ?09/26/21 62  ? ?Wt Readings from Last 3 Encounters:  ?11/19/21 141 lb (64 kg)  ?11/06/21 144 lb 2 oz (65.4 kg)  ?09/26/21 138 lb 4 oz (62.7 kg)  ? ?BMI Readings from Last 3 Encounters:  ?11/19/21 24.98 kg/m?   ?11/06/21 26.36 kg/m?  ?09/26/21 25.29 kg/m?  ? ? ?Assessment/Interventions: Review of patient past medical history, allergies, medications, health status, including review of consultants reports, laboratory and other test data, was performed as part of comprehensive evaluation and provision of chronic care management services.  ? ?SDOH:  (Social Determinants of  Health) assessments and interventions performed: No ? ?SDOH Screenings  ? ?Alcohol Screen: Not on file  ?Depression (PHQ2-9): Low Risk   ? PHQ-2 Score: 0  ?Financial Resource Strain: Not on file  ?Food Insecurity: Not on file  ?Housing: Not on file  ?Physical Activity: Not on file  ?Social Connections: Not on file  ?Stress: Not on file  ?Tobacco Use: Low Risk   ? Smoking Tobacco Use: Never  ? Smokeless Tobacco Use: Never  ? Passive Exposure: Not on file  ?Transportation Needs: Not on file  ? ? ?[place] ? ?Allergies  ?Allergen Reactions  ? Cabbage Other (See Comments)  ?  Hypersensitivity on allergy testing  ? Chocolate Other (See Comments)  ?  Hypersensitivity on allergy testing  ? Statins Other (See Comments)  ?  Zocor and Pravastatin--leg cramps ?Other reaction(s): Other (See Comments) ?Zocor and Pravastatin--leg cramps  ? ? ?Medications Reviewed Today   ? ? Reviewed by Charlton Haws, [facility] (Pharmacist) on 12/06/21 at 1614  Med List Status: <None>  ? ?Medication Order Taking? Sig Documenting Provider Last Dose Status Informant  ?amLODipine (NORVASC) 5 MG tablet [id number] Yes Take 1 tablet (5 mg total) by mouth daily. Ria Bush, MD Taking Active   ?aspirin EC 81 MG EC tablet [id number] Yes Take 1 tablet (81 mg total) by mouth daily. Swallow whole. Loletha Grayer, MD Taking Active   ?Calcium Carb-Cholecalciferol (CALCIUM 600+D) 600-800 MG-UNIT TABS [id number] Yes Take by mouth daily. 2 capsule daily [provider] Taking Active Self  ?clopidogrel (PLAVIX) 75 MG tablet [id number] Yes Take 1 tablet (75 mg total) by mouth daily.  Loletha Grayer, MD Taking Active   ?COLLAGEN PO [id number] Yes Take by mouth. [provider] Taking Active Self  ?conjugated estrogens (PREMARIN) vaginal cream [id number] Yes Use a pea-sized amount externa

## 2021-12-06 NOTE — Patient Instructions (Signed)
Visit Information ? ?Phone number for Pharmacist: [phone] ? ? Goals Addressed   ?None ?  ? ? ?Care Plan : CCM Pharmacy Care Plan  ?Updates made by Kathyrn Sheriff, RPH since 12/06/2021 12:00 AM  ?  ? ?Problem: Hypertension, Hyperlipidemia, Coronary Artery Disease, and GERD, hx Stroke   ?Priority: High  ?  ? ?Long-Range Goal: Disease mgmt   ?Start Date: 12/06/2021  ?Expected End Date: 12/07/2022  ?This Visit's Progress: On track  ?Priority: High  ?Note:   ?Current Barriers:  ?None identified ? ?Pharmacist Clinical Goal(s):  ?Patient will contact provider office for questions/concerns as evidenced notation of same in electronic health record through collaboration with PharmD and provider.  ? ?Interventions: ?1:1 collaboration with [name], MD regarding development and update of comprehensive plan of care as evidenced by provider attestation and co-signature ?Inter-disciplinary care team collaboration (see longitudinal plan of care) ?Comprehensive medication review performed; medication list updated in electronic medical record ? ?Hypertension (BP goal <140/90) ?-Controlled - BP at goal per report ?-Current treatment: ?Amlodipine 5 mg daily - Appropriate, Effective, Safe, Accessible ?-Medications previously tried: n/a  ?-Current home BP readings: "normal" ?-Educated on BP goals and benefits of medications for prevention of heart attack, stroke and kidney damage; ?-Counseled to monitor BP at home periodically ?-Recommended to continue current medication ? ?Hyperlipidemia / CVA (LDL goal < 70) ?-Controlled - LDL 66 (09/2021) at goal; pt started DAPT Jan 2023 after stroke, original plan was for 3 weeks of DAPT followed by Plavix monotherapy, however pt saw cardiology Feb 2023 and advised to continue aspirin and Plavix with hx of aortic atherosclerosis, PVD. ?-Hx CVA 09/2021; hx PVD; hx aortic atherosclerosis ?-Current treatment: ?Rosuvastatin 10 mg daily HS - Appropriate, Effective, Safe,  Accessible ?Clopidogrel 75 mg daily -Appropriate, Effective, Safe, Accessible ?Aspirin 81 mg  - Query appropriate ?-Medications previously tried: n/a  ?-Educated on Cholesterol goals; Benefits of statin for ASCVD risk reduction; ?-Discussed typical length of DAPT following stroke is 3 weeks; since cardiology advised to continue aspirin, will not contradict advice ?-Recommended to continue current medication ? ?GERD (Goal: manage symptoms) ?-Controlled - pt reports pantoprazole is not quite as effective as omeprazole (PPI was switched after Plavix initiation due to DDI) ?-Current treatment  ?Pantoprazole 40 mg daily - Appropriate, Effective, Safe, Accessible ?Famotidine 20 mg daily - Appropriate, Effective, Safe, Accessible ?-Medications previously tried: omeprazole  ?-Advised to use Famotidine up to BID for breakthrough symptoms ?-Recommended to continue current medication ? ?SOB (Goal: manage symptoms) ?-Controlled - pt reports doing much better with SOB since starting Wixela; pt has PFTs scheduled for tomorrow ?-Current treatment  ?Wixela 1 puff BID ?-Medications previously tried: n/a  ?-Recommended to continue current medication ? ?Patient Goals/Self-Care Activities ?Patient will:  ?- take medications as prescribed as evidenced by patient report and record review ?focus on medication adherence by routine ?check blood pressure periodically, document, and provide at future appointments ? ?  ?  ? ?Patient verbalizes understanding of instructions and care plan provided today and agrees to view in MyChart. Active MyChart status confirmed with patient.   ?Telephone follow up appointment with pharmacy team member scheduled for: 3 months ? ?Kathy Baldwin, PharmD, BCACP ?Clinical Pharmacist ?[facility] ?[phone] ?  ?

## 2021-12-07 ENCOUNTER — Ambulatory Visit: Payer: PPO | Attending: Pulmonary Disease

## 2021-12-07 DIAGNOSIS — R0602 Shortness of breath: Secondary | ICD-10-CM | POA: Diagnosis not present

## 2021-12-07 DIAGNOSIS — E785 Hyperlipidemia, unspecified: Secondary | ICD-10-CM

## 2021-12-07 DIAGNOSIS — I10 Essential (primary) hypertension: Secondary | ICD-10-CM | POA: Diagnosis not present

## 2021-12-07 MED ORDER — ALBUTEROL SULFATE (2.5 MG/3ML) 0.083% IN NEBU
2.5000 mg | INHALATION_SOLUTION | Freq: Once | RESPIRATORY_TRACT | Status: AC
Start: 2021-12-07 — End: 2021-12-07
  Administered 2021-12-07: 2.5 mg via RESPIRATORY_TRACT
  Filled 2021-12-07: qty 3

## 2021-12-10 ENCOUNTER — Other Ambulatory Visit: Payer: Self-pay | Admitting: Family Medicine

## 2021-12-11 ENCOUNTER — Telehealth: Payer: Self-pay

## 2021-12-11 NOTE — Chronic Care Management (AMB) (Signed)
? ? ?Chronic Care Management ?Pharmacy Assistant  ? ?Name: Kathy Baldwin  MRN: [id number] DOB: Sep 26, 1945 ? ? ?Reason for Encounter: Medication Adherence and Delivery Coordination ?  ? ?Recent office visits:  ?11/06/21-PCP-Javier Gutierrez,MD-Dyspnea and fatigue.Labs ordered,( Your D dimer test returned elevated. For this reason I recommend proceeding with contrasted CT of chest to rule out blood clot-your initial labs returned normal ,   chest xray-Will order CTA chest r/o small PE. (Negative for blood clot) ? ?Recent consult visits:  ?11/19/21-Pulmonology-Carmen Gonzalez,MD- Continue Wixela 2 times daily,get O2 level on CPAP,referral for breathing tests. ? ?Hospital visits:  ?None in previous 6 months ? ?Medications: ?Outpatient Encounter Medications as of 12/11/2021  ?Medication Sig  ? amLODipine (NORVASC) 5 MG tablet Take 1 tablet (5 mg total) by mouth daily.  ? aspirin EC 81 MG EC tablet Take 1 tablet (81 mg total) by mouth daily. Swallow whole.  ? Calcium Carb-Cholecalciferol (CALCIUM 600+D) 600-800 MG-UNIT TABS Take by mouth daily. 2 capsule daily  ? clopidogrel (PLAVIX) 75 MG tablet Take 1 tablet (75 mg total) by mouth daily.  ? COLLAGEN PO Take by mouth.  ? conjugated estrogens (PREMARIN) vaginal cream Use a pea-sized amount externally and internally twice a week at night  ? Cyanocobalamin (B-12) 2500 MCG TABS Take 1 tablet by mouth daily. Pt taking 1/2 of a 5000 mcg tablet  ? donepezil (ARICEPT) 10 MG tablet Take 10 mg by mouth at bedtime.   ? famotidine (PEPCID) 20 MG tablet Take 1 tablet (20 mg total) by mouth 2 (two) times daily as needed for heartburn or indigestion.  ? FIBER PO Take by mouth.  ? fluticasone (FLONASE) 50 MCG/ACT nasal spray SHAKE LIQUID AND USE 2 SPRAYS IN EACH NOSTRIL DAILY  ? fluticasone-salmeterol (WIXELA INHUB) 250-50 MCG/ACT AEPB Inhale 1 puff into the lungs in the morning and at bedtime.  ? meclizine (ANTIVERT) 25 MG tablet Take 1 tablet (25 mg total) by mouth 3 (three) times  daily as needed for dizziness.  ? memantine (NAMENDA) 10 MG tablet Take 10 mg by mouth 2 (two) times daily.  ? mirabegron ER (MYRBETRIQ) 25 MG TB24 tablet Take 1 tablet (25 mg total) by mouth daily.  ? Multiple Vitamin (MULTIVITAMIN) tablet Take 1 tablet by mouth daily.  ? Multiple Vitamins-Minerals (HAIR/SKIN/NAILS/BIOTIN PO) Take 1 tablet by mouth daily.  ? pantoprazole (PROTONIX) 40 MG tablet Take 1 tablet (40 mg total) by mouth daily as needed (GERD symptoms).  ? PEDIASURE/FIBER (PEDIASURE/FIBER) LIQD Take 10 mLs by mouth.  ? polyethylene glycol (MIRALAX / GLYCOLAX) packet Take 17 g by mouth daily.  ? rosuvastatin (CRESTOR) 10 MG tablet TAKE 1 TABLET(10 MG) BY MOUTH AT BEDTIME  ? ?No facility-administered encounter medications on file as of 12/11/2021.  ? ? ?BP Readings from Last 3 Encounters:  ?11/19/21 110/80  ?11/06/21 140/68  ?09/26/21 (!) 156/70  ?  ?Lab Results  ?Component Value Date  ? HGBA1C 5.6 09/11/2021  ?  ? ? ?Recent OV, Consult or Hospital visit:  Pulmonology ?No medication changes indicated ? ? ?Last adherence delivery date:09/24/21     ? ?Patient is due for next adherence delivery on: 12/21/21- (Request 12/20/21) ? ?Spoke with patient on 12/12/21 reviewed medications and coordinated delivery. Spoke with Husband [name]  ? ?This delivery to include: Adherence Packaging  90 Days  ?Packs: ?        Donepezil 10 mg one daily - 1 bedtime ?           Famotidine 20  mg one daily - 1 breakfast, 1 evening meal ?           Memantine 10 mg - 1 breakfast, 1 evening meal   ?           Rosuvastatin 10 mg one daily - 1 bedtime ?           Myrbetriq 25 mg -- take 1 tablet at breakfast  ? ?VIAL medications: ?Fluticasone nasal spray- 2 sprays each nostril PRN ?Pantoprazole 40mg -take 1 tablet daily as needed  ? ?Patient declined the following medications this month: ? Amlodipine 5mg  -stopped in hospital  ? Plavix 75mg - picked up 90ds 11/14/21 ? Aspirin 81mg  getting OTC ? Wixela inhaler- 1 puff 2 times daily -patient has supply  on hand  ? ?Any concerns about your medications? No ? ?How often do you forget or accidentally miss a dose? Rarely occasion to miss during a hospital stay  ? ?Is patient in packaging Yes ? What is the date on your next pill pack? Not near the package for date  ? Any concerns or issues with your packaging? No concerns at this time  ? ? ?Refills requested from providers include: Fluticasone nasal spray  ? ?Confirmed delivery date of 12/20/21, advised patient that pharmacy will contact them the morning of delivery. Patient requests to move up delivery date by 1 day due to leaving town  ? ?Recent blood pressure readings are as follows: none available  ? ?Annual wellness visit in last year? Yes ?Most Recent BP reading: 110/80  63-P  11/19/21 ? ? ?Charlene Brooke, CPP notified ? ?Shaily Librizzi, CCMA ?Health concierge  ?[phone]  ?

## 2021-12-19 ENCOUNTER — Telehealth: Payer: Self-pay | Admitting: *Deleted

## 2021-12-19 NOTE — Telephone Encounter (Signed)
Called and spoke with patient, advised of results of the the ONO on CPAP per Dr. Jayme Cloud.  Advised that Dr. Jayme Cloud stated that there was no need for oxygen with CPAP.  She verbalized understanding.  She has a f/u with Dr. Jayme Cloud on 12/20/2021.  Nothing further needed. ?

## 2021-12-20 ENCOUNTER — Encounter: Payer: Self-pay | Admitting: Pulmonary Disease

## 2021-12-20 ENCOUNTER — Ambulatory Visit (INDEPENDENT_AMBULATORY_CARE_PROVIDER_SITE_OTHER): Payer: PPO | Admitting: Pulmonary Disease

## 2021-12-20 VITALS — BP 120/70 | HR 61 | Temp 97.5°F | Ht 63.0 in | Wt 145.4 lb

## 2021-12-20 DIAGNOSIS — J4530 Mild persistent asthma, uncomplicated: Secondary | ICD-10-CM | POA: Diagnosis not present

## 2021-12-20 DIAGNOSIS — R0602 Shortness of breath: Secondary | ICD-10-CM

## 2021-12-20 DIAGNOSIS — G4733 Obstructive sleep apnea (adult) (pediatric): Secondary | ICD-10-CM

## 2021-12-20 NOTE — Progress Notes (Deleted)
   Subjective:    Patient ID: Kathy Baldwin, female    DOB: [birth year], 76 y.o.   MRN: [id number]  HPI    Review of Systems     Objective:   Physical Exam        Assessment & Plan:

## 2021-12-20 NOTE — Patient Instructions (Signed)
Continue taking Wixela twice a day.  Make sure you rinse your mouth well after use it. ? ? ?We will see you back in follow-up in 6 months time. ?

## 2021-12-20 NOTE — Progress Notes (Signed)
Subjective:    Patient ID: Kathy Baldwin, female    DOB: 1945/11/04, 76 y.o.   MRN: [id number] Patient Care Team: [name], MD as PCP - General (Family Medicine) Midge Minium, MD as Consulting Physician (Gastroenterology) [facility], [facility] (Optometry) Solum, Marlana Salvage, MD as Physician Assistant (Endocrinology) Linus Salmons, MD as Consulting Physician (Otolaryngology) Lockie Mola, MD as Referring Physician (Ophthalmology) Kathyrn Sheriff, [facility] as Pharmacist (Pharmacist)  Chief Complaint  Patient presents with   Follow-up    No concerns.    HPI The patient is a 76 year old lifelong never smoker who presents for follow-up of dyspnea and fatigue with onset around the time of a stroke in January 2023.  Patient has mild memory impairment so she is accompanied today by her husband, [name].  Both patient and husband indicate that she actually in reality started having shortness of breath prior to her stroke.  We first evaluated her here on 19 November 2021, at that time we asked her to continue using Wixela which had been started for previously and PFTs were ordered.  PFTs were obtained on 07 December 2021 and these were consistent with mild airway obstruction with reversibility.  PFTs did show small airways component.  She has been doing very well since her initial visit here. She has not had any cough, no wheezing, no fevers, chills or sweats.  The patient does not endorse any other symptomatology.   She is currently on Wixela (Advair generic) 250/50, she is compliant with the medication.  Has not required use of rescue inhaler.  Review of Systems A 10 point review of systems was performed and it is as noted above otherwise negative.  Patient Active Problem List   Diagnosis Date Noted   Small airways disease 11/09/2021   Exertional dyspnea 11/06/2021   Other fatigue 09/26/2021   Left-sided weakness    Stroke (HCC) 09/11/2021   Bradycardia 04/27/2021   Left hip  pain 03/14/2021   Urge urinary incontinence 03/06/2021   Medicare annual wellness visit, subsequent 03/05/2021   Advanced directives, counseling/discussion 03/05/2021   Seborrheic keratosis 12/13/2020   Abdominal bloating 05/12/2020   Syncope 03/31/2020   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   Mass of leg, left 10/23/2018   History of cerebellar stroke 10/08/2018   Small vessel disease, 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   Carotid stenosis, bilateral 10/08/2018   Thyroid nodule 04/14/2018   DNR no code (do not resuscitate) 03/20/2017   Vaginal atrophy 02/18/2017   Aortic atherosclerosis (HCC) 09/05/2016   Decreased hearing, bilateral 12/04/2015   Health maintenance examination 12/04/2015   Hypertension    ASCUS with positive high risk human papillomavirus of vagina    GERD (gastroesophageal reflux disease)    OSA on CPAP    Hypertrophy of nasal turbinates    Hyperlipidemia 03/28/2015   Allergic rhinitis 03/28/2015   History of decompression of median nerve 02/14/2014   Osteopenia 06/09/2013   Social History   Tobacco Use   Smoking status: Never   Smokeless tobacco: Never   Tobacco comments:    Never smoked, so cannot quit...  Substance Use Topics   Alcohol use: Not Currently    Alcohol/week: 0.0 standard drinks    Comment: Occasional celebratory drink (holidays, etc.) only.   Allergies  Allergen Reactions   Cabbage Other (See Comments)    Hypersensitivity on allergy testing   Chocolate Other (See Comments)    Hypersensitivity on allergy testing   Statins Other (See Comments)  Zocor and Pravastatin--leg cramps Other reaction(s): Other (See Comments) Zocor and Pravastatin--leg cramps   Current Meds  Medication Sig   amLODipine (NORVASC) 5 MG tablet Take 1 tablet (5 mg total) by mouth daily.   aspirin EC 81 MG EC tablet Take 1 tablet (81 mg total) by mouth daily. Swallow whole.   Calcium Carb-Cholecalciferol (CALCIUM 600+D) 600-800 MG-UNIT TABS  Take by mouth daily. 2 capsule daily   clopidogrel (PLAVIX) 75 MG tablet Take 1 tablet (75 mg total) by mouth daily.   COLLAGEN PO Take by mouth.   conjugated estrogens (PREMARIN) vaginal cream Use a pea-sized amount externally and internally twice a week at night   Cyanocobalamin (B-12) 2500 MCG TABS Take 1 tablet by mouth daily. Pt taking 1/2 of a 5000 mcg tablet   donepezil (ARICEPT) 10 MG tablet Take 10 mg by mouth at bedtime.    famotidine (PEPCID) 20 MG tablet Take 1 tablet (20 mg total) by mouth 2 (two) times daily as needed for heartburn or indigestion.   FIBER PO Take by mouth.   fluticasone (FLONASE) 50 MCG/ACT nasal spray USE TWO SPRAYS in each nostril daily(shake liquid)   fluticasone-salmeterol (WIXELA INHUB) 250-50 MCG/ACT AEPB Inhale 1 puff into the lungs in the morning and at bedtime.   meclizine (ANTIVERT) 25 MG tablet Take 1 tablet (25 mg total) by mouth 3 (three) times daily as needed for dizziness.   memantine (NAMENDA) 10 MG tablet Take 10 mg by mouth 2 (two) times daily.   Multiple Vitamin (MULTIVITAMIN) tablet Take 1 tablet by mouth daily.   Multiple Vitamins-Minerals (HAIR/SKIN/NAILS/BIOTIN PO) Take 1 tablet by mouth daily.   MYRBETRIQ 25 MG TB24 tablet TAKE ONE TABLET BY MOUTH ONCE DAILY   pantoprazole (PROTONIX) 40 MG tablet Take 1 tablet (40 mg total) by mouth daily as needed (GERD symptoms).   PEDIASURE/FIBER (PEDIASURE/FIBER) LIQD Take 10 mLs by mouth.   polyethylene glycol (MIRALAX / GLYCOLAX) packet Take 17 g by mouth daily.   rosuvastatin (CRESTOR) 10 MG tablet TAKE 1 TABLET(10 MG) BY MOUTH AT BEDTIME   Immunization History  Administered Date(s) Administered   Fluad Quad(high Dose 65+) 05/12/2020, 06/08/2021   Influenza, High Dose Seasonal PF 06/27/2017, 05/21/2018   Influenza, Quadrivalent, Recombinant, Inj, Pf 07/05/2019   Influenza, Seasonal, Injecte, Preservative Fre 05/25/2015   Influenza-Unspecified 06/12/2011, 05/10/2014, 06/22/2016, 06/27/2017    Moderna Sars-Covid-2 Vaccination 10/22/2019, 11/19/2019, 07/03/2020   Pneumococcal Conjugate-13 11/30/2014   Pneumococcal Polysaccharide-23 08/13/2011, 10/01/2013   Td 06/09/2002   Tdap 09/10/2011, 08/13/2012   Zoster Recombinat (Shingrix) 04/27/2018, 06/29/2018   Zoster, Live 08/13/2012       Objective:   Physical Exam BP 120/70 (BP Location: Left Arm, Patient Position: Sitting, Cuff Size: Large)   Pulse 61   Temp (!) 97.5 F (36.4 C) (Oral)   Ht  (1.6 m)   Wt 145 lb 6.4 oz (66 kg)   SpO2 (!) 2%   BMI 25.76 kg/m   GENERAL: Well-developed, well-nourished woman, no acute distress, fully ambulatory. HEAD: Normocephalic, atraumatic.  EYES: Pupils equal, round, reactive to light.  No scleral icterus.  MOUTH: Nose/mouth/throat not examined due to institutional masking requirements. NECK: Supple. No thyromegaly. Trachea midline. No JVD.  No adenopathy. PULMONARY: Good air entry bilaterally.  No adventitious sounds. CARDIOVASCULAR: S1 and S2. Regular rate and rhythm.  No rubs, murmurs or gallops heard. ABDOMEN: Benign. MUSCULOSKELETAL: No joint deformity, no clubbing, no edema.  NEUROLOGIC: No overt focal deficit, no gait disturbance, speech is fluent. SKIN: Intact,warm,dry.  PSYCH: Mildly befuddled.      Assessment & Plan:     ICD-10-CM   1. Mild persistent asthma without complication  J45.30    Continue Wixela 250/50,1 inhalation twice a day Continue as needed albuterol    2. SOB (shortness of breath)  R06.02    Markedly improved on Wixela Conditioning program     Appears well compensated.  Will see her in follow-up in 6 months time she is to contact us prior to that time should any new difficulties arise.  Gailen Shelter, MD Advanced Bronchoscopy PCCM [facility]    *This note was dictated using voice recognition software/Dragon.  Despite best efforts to proofread, errors can occur which can change the meaning. Any transcriptional errors  that result from this process are unintentional and may not be fully corrected at the time of dictation.

## 2022-01-07 ENCOUNTER — Ambulatory Visit (INDEPENDENT_AMBULATORY_CARE_PROVIDER_SITE_OTHER): Payer: PPO | Admitting: Family Medicine

## 2022-01-07 ENCOUNTER — Encounter: Payer: Self-pay | Admitting: Family Medicine

## 2022-01-07 VITALS — BP 110/72 | HR 62 | Temp 98.5°F | Ht 63.0 in | Wt 145.2 lb

## 2022-01-07 DIAGNOSIS — M79605 Pain in left leg: Secondary | ICD-10-CM

## 2022-01-07 NOTE — Progress Notes (Signed)
? ? ?Kathy Noreen T. Courtlynn Holloman, MD, CAQ Sports Medicine ?Nature conservation officer at [facility] ?[address] [place] ?[place], [address] ? ?Phone: [phone]  FAX: [phone] ? ?Kathy Baldwin - 76 y.o. female  MRN [id number]  Date of Birth: 01/09/1946 ? ?Date: 01/07/2022  PCP: Kathy Boyden, MD  Referral: Kathy Boyden, MD ? ?Chief Complaint  ?Patient presents with  ? Leg Pain  ?  Left-Was on cruise last week.  Had trouble walking and sharp pain shooting down leg  ? ? ?This visit occurred during the SARS-CoV-2 public health emergency.  Safety protocols were in place, including screening questions prior to the visit, additional usage of staff PPE, and extensive cleaning of exam room while observing appropriate contact time as indicated for disinfecting solutions.  ? ?Subjective:  ? ?Kathy Baldwin is a 76 y.o. very pleasant female patient with Body mass index is 25.73 kg/m?. who presents with the following: ? ?L leg pain? F/u imaging of leg. ? ?Several years ago had a ganglion cyst and had another cyst.  Sealed it and injected some dextrose.    ?- Dr. Milta Deiters did prior leg surgery, notes reviewed. ?- additional dextrose injection after drainage in 2021 ? ?There was also some peroneal nerve impairment - some difficulty in the L leg.  These are post-cyst, post-surgical chages. ?Still hurting some.  ? ?Since she has returned from her cruise, she has been complaining about this less, and seems to be in less pain. ? ?Not a ton - was cruising last week. ? ? ? ?Lateral leg hurting some, pes, some at the joint line, but getting better ? ?Review of Systems is noted in the HPI, as appropriate ? ?Objective:  ? ?BP 110/72   Pulse 62   Temp 98.5 ?F (36.9 ?C) (Oral)   Ht 5\' 3"  (1.6 m)   Wt 145 lb 4 oz (65.9 kg)   SpO2 96%   BMI 25.73 kg/m?  ? ?GEN: No acute distress; alert,appropriate. ?PULM: Breathing comfortably in no respiratory distress ?PSYCH: Normally interactive.  ? ?Full range of motion at  the knee.  She stands and walks without any significant limp or obvious pain. ? ?Stable to varus and valgus stress.  ACL and PCL are intact.  No significant pain with forced flexion or extension.  McMurray's negative.  She does have some mild tenderness at the pes anserine bursa. ? ?No tenderness with deep palpation of the lateral calf ? ? ?Laboratory and Imaging Data: ? ?Assessment and Plan:  ? ?  ICD-10-CM   ?1. Acute leg pain, left  M79.605   ?  ? ?No significant pain on exam.  This is improving.  I do not think any other investigation or intervention is needed.  Think it is potentially more harmful than not to investigate and perform additional intervention. ? ? ?Dragon Medical One speech-to-text software was used for transcription in this dictation.  Possible transcriptional errors can occur using .  ? ?Signed, ? ?Chrisangel Eskenazi T. Giovonnie Trettel, MD ? ? ?Outpatient Encounter Medications as of 01/07/2022  ?Medication Sig  ? amLODipine (NORVASC) 5 MG tablet Take 1 tablet (5 mg total) by mouth daily.  ? aspirin EC 81 MG EC tablet Take 1 tablet (81 mg total) by mouth daily. Swallow whole.  ? Calcium Carb-Cholecalciferol (CALCIUM 600+D) 600-800 MG-UNIT TABS Take by mouth daily. 2 capsule daily  ? clopidogrel (PLAVIX) 75 MG tablet Take 1 tablet (75 mg total) by mouth daily.  ? COLLAGEN PO Take by  mouth.  ? conjugated estrogens (PREMARIN) vaginal cream Use a pea-sized amount externally and internally twice a week at night  ? Cyanocobalamin (B-12) 2500 MCG TABS Take 1 tablet by mouth daily. Pt taking 1/2 of a 5000 mcg tablet  ? donepezil (ARICEPT) 10 MG tablet Take 10 mg by mouth at bedtime.   ? famotidine (PEPCID) 20 MG tablet Take 1 tablet (20 mg total) by mouth 2 (two) times daily as needed for heartburn or indigestion.  ? FIBER PO Take by mouth.  ? fluticasone (FLONASE) 50 MCG/ACT nasal spray USE TWO SPRAYS in each nostril daily(shake liquid)  ? fluticasone-salmeterol (WIXELA INHUB) 250-50 MCG/ACT AEPB Inhale 1  puff into the lungs in the morning and at bedtime.  ? meclizine (ANTIVERT) 25 MG tablet Take 1 tablet (25 mg total) by mouth 3 (three) times daily as needed for dizziness.  ? memantine (NAMENDA) 10 MG tablet Take 10 mg by mouth 2 (two) times daily.  ? Multiple Vitamin (MULTIVITAMIN) tablet Take 1 tablet by mouth daily.  ? Multiple Vitamins-Minerals (HAIR/SKIN/NAILS/BIOTIN PO) Take 1 tablet by mouth daily.  ? MYRBETRIQ 25 MG TB24 tablet TAKE ONE TABLET BY MOUTH ONCE DAILY  ? pantoprazole (PROTONIX) 40 MG tablet Take 1 tablet (40 mg total) by mouth daily as needed (GERD symptoms).  ? PEDIASURE/FIBER (PEDIASURE/FIBER) LIQD Take 10 mLs by mouth.  ? polyethylene glycol (MIRALAX / GLYCOLAX) packet Take 17 g by mouth daily.  ? rosuvastatin (CRESTOR) 10 MG tablet TAKE 1 TABLET(10 MG) BY MOUTH AT BEDTIME  ? ?No facility-administered encounter medications on file as of 01/07/2022.  ?  ?

## 2022-01-31 ENCOUNTER — Other Ambulatory Visit: Payer: Self-pay | Admitting: Family Medicine

## 2022-02-01 NOTE — Telephone Encounter (Signed)
Wixela inhaler Last filled:  11/12/21, #60 ea Last OV:  11/06/21, exertional dyspnea Next OV:  none

## 2022-02-26 ENCOUNTER — Other Ambulatory Visit: Payer: Self-pay | Admitting: Family Medicine

## 2022-02-26 DIAGNOSIS — Z1231 Encounter for screening mammogram for malignant neoplasm of breast: Secondary | ICD-10-CM

## 2022-03-01 ENCOUNTER — Telehealth: Payer: Self-pay

## 2022-03-06 ENCOUNTER — Ambulatory Visit (INDEPENDENT_AMBULATORY_CARE_PROVIDER_SITE_OTHER): Payer: PPO | Admitting: Pharmacist

## 2022-03-06 DIAGNOSIS — E782 Mixed hyperlipidemia: Secondary | ICD-10-CM

## 2022-03-06 DIAGNOSIS — K219 Gastro-esophageal reflux disease without esophagitis: Secondary | ICD-10-CM

## 2022-03-06 DIAGNOSIS — I700 Atherosclerosis of aorta: Secondary | ICD-10-CM

## 2022-03-06 DIAGNOSIS — Z8673 Personal history of transient ischemic attack (TIA), and cerebral infarction without residual deficits: Secondary | ICD-10-CM

## 2022-03-06 DIAGNOSIS — I10 Essential (primary) hypertension: Secondary | ICD-10-CM

## 2022-03-06 NOTE — Progress Notes (Signed)
Chronic Care Management Pharmacy Note  03/08/2022 Name:  Kathy Baldwin MRN:  [id number] DOB:  Dec 20, 1945  Summary: CCM F/U visit -Reviewed medication pt has not been taking amlodipine, she got confused about it being stopped in the hospital in Jan, however it was restarted in Feb per PCP  Recommendations/Changes made from today's visit: -Monitor BP daily x 2 weeks. F/u with CCM team 2 weeks. Restart amlodipine if BP > 140/90 on average  Plan: -[facility] will call patient 2 weeks for BP update -Pharmacist follow up televisit scheduled for 3 months    Subjective: Kathy Baldwin is an 76 y.o. year old female who is a primary patient of Ria Bush, MD.  The CCM team was consulted for assistance with disease management and care coordination needs.    Engaged with patient by telephone for follow up visit in response to provider referral for pharmacy case management and/or care coordination services.   Consent to Services:  The patient was given information about Chronic Care Management services, agreed to services, and gave verbal consent prior to initiation of services.  Please see initial visit note for detailed documentation.   Patient Care Team: Ria Bush, MD as PCP - General (Family Medicine) Lucilla Lame, MD as Consulting Physician (Gastroenterology) Pa, [place] (Optometry) [place], Betsey Holiday, MD as Physician Assistant (Endocrinology) Beverly Gust, MD as Consulting Physician (Otolaryngology) Leandrew Koyanagi, MD as Referring Physician (Ophthalmology) Charlton Haws, [facility] as Pharmacist (Pharmacist)  Recent office visits: 01/07/22 Dr Lorelei Pont OV: acute leg pain - improving.  11/06/21 Dr Danise Mina OV: fatigue, DOE. Order CT to r/o PE. CT negative for PE but showed possible COPD. Rx'd Wixela 1 puff BID.  10/16/21 PCP phone note - increase amlodipine to 5 mg. If BP drops, reduce back to 2.5 mg   09/26/21 Dr Danise Mina OV: hospital  f/u (CVA); restart amlodipine 2.5. DAPT x 3 wks then continue daily plavix only.  09/17/21 pt message: omeprazole switched to pantoprazole d/t DDI with Plavix.  Recent consult visits: 12/20/21 Dr Patsey Berthold (Pulmonary): f/u asthma. No changes. 11/19/21 Dr Patsey Berthold (Pulmonary): consult SOB. Continue Wixela. Order PFT.  11/02/21 PA Jettie Booze (Cardiology): continue aspirin, plavix for hx PVD, AA, carotid stenosis.  10/24/21 Dr Melrose Nakayama (neurology): f/u memory loss. Refill donepezil, Namenda.  Hospital visits: Medication Reconciliation was completed by comparing discharge summary, patient's EMR and Pharmacy list, and upon discussion with patient.  Admitted to the hospital on 09/11/21 due to Stroke. Discharge date was 09/13/21. Discharged from [facility].    New?Medications Started at [facility] Discharge:?? -started aspirin and Plavix due to stroke  Medications Discontinued at Hospital Discharge: -Stopped amlodipine, naproxen, omeprazol  Medications that remain the same after Hospital Discharge:??  -All other medications will remain the same.     Objective:  Lab Results  Component Value Date   CREATININE 0.88 11/06/2021   BUN 21 11/06/2021   GFR 63.48 11/06/2021   GFRNONAA >60 09/13/2021   GFRAA 87 10/08/2018   NA 144 11/06/2021   K 3.8 11/06/2021   CALCIUM 9.7 11/06/2021   CO2 30 11/06/2021   GLUCOSE 74 11/06/2021    Lab Results  Component Value Date/Time   HGBA1C 5.6 09/11/2021 07:20 PM   GFR 63.48 11/06/2021 10:15 AM   GFR 70.46 02/26/2021 07:59 AM   MICROALBUR <0.7 02/26/2021 07:59 AM    Last diabetic Eye exam: No results found for: "HMDIABEYEEXA"  Last diabetic Foot exam: No results found for: "HMDIABFOOTEX"   Lab Results  Component Value Date   CHOL 170 09/11/2021   HDL 92 09/11/2021   LDLCALC 66 09/11/2021   TRIG 60 09/11/2021   CHOLHDL 1.8 09/11/2021       Latest Ref Rng & Units 09/11/2021    8:57 AM 02/26/2021    7:59 AM 05/12/2020    1:49 PM  Hepatic Function   Total Protein 6.5 - 8.1 g/dL 7.5  6.9  6.9   Albumin 3.5 - 5.0 g/dL 4.3  4.5  4.2   AST 15 - 41 U/L 36  32  31   ALT 0 - 44 U/L _0 Alk Phosphatase 38 - 126 U/L 80  69  63   Total Bilirubin 0.3 - 1.2 mg/dL 1.0  0.9  0.8   Bilirubin, Direct 0.0 - 0.3 mg/dL   0.1     Lab Results  Component Value Date/Time   TSH 0.51 11/06/2021 10:15 AM   TSH 0.33 (L) 09/26/2021 10:27 AM   FREET4 0.64 11/06/2021 10:15 AM   FREET4 0.72 09/26/2021 03:20 PM       Latest Ref Rng & Units 11/06/2021   10:15 AM 09/13/2021    6:17 AM 09/12/2021    5:26 AM  CBC  WBC 4.0 - 10.5 K/uL 5.3  7.8  6.1   Hemoglobin 12.0 - 15.0 g/dL 12.8  13.2  13.2   Hematocrit 36.0 - 46.0 % 39.2  39.8  40.6   Platelets 150.0 - 400.0 K/uL 210.0  221  216     Lab Results  Component Value Date/Time   VD25OH 61.54 09/26/2021 10:27 AM   VD25OH 61.25 02/26/2021 07:59 AM    Clinical ASCVD: Yes  The ASCVD Risk score (Arnett DK, et al., 2019) failed to calculate for the following reasons:   The patient has a prior MI or stroke diagnosis       03/05/2021    9:55 AM 08/17/2019   12:13 PM 04/20/2019    2:48 PM  Depression screen PHQ 2/9  Decreased Interest 0 0 0  Down, Depressed, Hopeless 0 0 0  PHQ - 2 Score 0 0 0  Altered sleeping 0 0   Tired, decreased energy 0 1   Change in appetite 0 0   Feeling bad or failure about yourself  0 0   Trouble concentrating 0 0   Moving slowly or fidgety/restless 0 0   Suicidal thoughts 0 0   PHQ-9 Score 0 1       Social History   Tobacco Use  Smoking Status Never  Smokeless Tobacco Never  Tobacco Comments   Never smoked, so cannot quit...   BP Readings from Last 3 Encounters:  01/07/22 110/72  12/20/21 120/70  11/19/21 110/80   Pulse Readings from Last 3 Encounters:  01/07/22 62  12/20/21 61  11/19/21 63   Wt Readings from Last 3 Encounters:  01/07/22 145 lb 4 oz (65.9 kg)  12/20/21 145 lb 6.4 oz (66 kg)  11/19/21 141 lb (64 kg)   BMI Readings from Last 3  Encounters:  01/07/22 25.73 kg/m  12/20/21 25.76 kg/m  11/19/21 24.98 kg/m    Assessment/Interventions: Review of patient past medical history, allergies, medications, health status, including review of consultants reports, laboratory and other test data, was performed as part of comprehensive evaluation and provision of chronic care management services.   SDOH:  (Social Determinants of Health) assessments and interventions performed: No  SDOH Screenings   Alcohol Screen: Not on  file  Depression (PHQ2-9): Low Risk  (03/05/2021)   Depression (PHQ2-9)    PHQ-2 Score: 0  Financial Resource Strain: Low Risk  (04/17/2018)   Overall Financial Resource Strain (CARDIA)    Difficulty of Paying Living Expenses: Not hard at all  Food Insecurity: No Food Insecurity (04/17/2018)   Hunger Vital Sign    Worried About Running Out of Food in the Last Year: Never true    [place] in the Last Year: Never true  Housing: Not on file  Physical Activity: Inactive (04/17/2018)   Exercise Vital Sign    Days of Exercise per Week: 0 days    Minutes of Exercise per Session: 0 min  Social Connections: Unknown (04/17/2018)   Social Connection and Isolation Panel [NHANES]    Frequency of Communication with Friends and Family: Patient refused    Frequency of Social Gatherings with Friends and Family: Patient refused    Attends Religious Services: Patient refused    Active Member of Clubs or Organizations: Patient refused    Attends Archivist Meetings: Patient refused    Marital Status: Married  Stress: No Stress Concern Present (04/17/2018)   [place] of Stress : Not at all  Tobacco Use: Low Risk  (01/07/2022)   Patient History    Smoking Tobacco Use: Never    Smokeless Tobacco Use: Never    Passive Exposure: Not on file  Transportation Needs: No Transportation Needs (04/17/2018)   PRAPARE - Transportation    Lack of  Transportation (Medical): No    Lack of Transportation (Non-Medical): No    CCM Care Plan  Allergies  Allergen Reactions   Cabbage Other (See Comments)    Hypersensitivity on allergy testing   Chocolate Other (See Comments)    Hypersensitivity on allergy testing   Statins Other (See Comments)    Zocor and Pravastatin--leg cramps Other reaction(s): Other (See Comments) Zocor and Pravastatin--leg cramps    Medications Reviewed Today     Reviewed by Charlton Haws, [facility] (Pharmacist) on 03/08/22 at 1406  Med List Status: <None>   Medication Order Taking? Sig Documenting Provider Last Dose Status Informant  amLODipine (NORVASC) 5 MG tablet [id number] No Take 1 tablet (5 mg total) by mouth daily.  Patient not taking: Reported on 03/06/2022   Ria Bush, MD Not Taking Active   aspirin EC 81 MG EC tablet [id number] Yes Take 1 tablet (81 mg total) by mouth daily. Swallow whole. Loletha Grayer, MD Taking Active   Calcium Carb-Cholecalciferol (CALCIUM 600+D) 600-800 MG-UNIT TABS [id number] Yes Take by mouth daily. 2 capsule daily [provider] Taking Active Self  clopidogrel (PLAVIX) 75 MG tablet [id number] Yes Take 1 tablet (75 mg total) by mouth daily. Loletha Grayer, MD Taking Active   COLLAGEN PO [id number] Yes Take by mouth. [provider] Taking Active Self  conjugated estrogens (PREMARIN) vaginal cream [id number] Yes Use a pea-sized amount externally and internally twice a week at night Ria Bush, MD Taking Active Self  Cyanocobalamin (B-12) 2500 MCG TABS [id number] Yes Take 1 tablet by mouth daily. Pt taking 1/2 of a 5000 mcg tablet [provider] Taking Active Self  donepezil (ARICEPT) 10 MG tablet [id number] Yes Take 10 mg by mouth at bedtime.  [provider] Taking Active Self  famotidine (PEPCID) 20 MG tablet [id number] Yes TAKE ONE TABLET BY MOUTH TWICE DAILY for heartburn/indigestion Ria Bush, MD Taking Active  FIBER PO [id number] Yes Take by mouth. [provider] Taking Active Self  fluticasone (FLONASE) 50 MCG/ACT nasal spray [id number] Yes USE TWO SPRAYS in each nostril daily(shake liquid) Ria Bush, MD Taking Active   meclizine (ANTIVERT) 25 MG tablet [id number] Yes Take 1 tablet (25 mg total) by mouth 3 (three) times daily as needed for dizziness. Ria Bush, MD Taking Active   memantine [facility]) 10 MG tablet [id number] Yes Take 10 mg by mouth 2 (two) times daily. [provider] Taking Active Self  Multiple Vitamin (MULTIVITAMIN) tablet [id number] Yes Take 1 tablet by mouth daily. [provider] Taking Active Self  Multiple Vitamins-Minerals (HAIR/SKIN/NAILS/BIOTIN PO) [id number] Yes Take 1 tablet by mouth daily. [provider] Taking Active Self  MYRBETRIQ 25 MG TB24 tablet [id number] Yes TAKE ONE TABLET BY MOUTH ONCE DAILY Ria Bush, MD Taking Active   pantoprazole (PROTONIX) 40 MG tablet [id number] Yes Take 1 tablet (40 mg total) by mouth daily as needed (GERD symptoms). Ria Bush, MD Taking Active   PEDIASURE/FIBER (PEDIASURE/FIBER) LIQD [id number] Yes Take 10 mLs by mouth. [provider] Taking Active Self  polyethylene glycol (MIRALAX / GLYCOLAX) packet [id number] Yes Take 17 g by mouth daily. [provider] Taking Active Self  rosuvastatin (CRESTOR) 10 MG tablet [id number] Yes TAKE ONE TABLET BY MOUTH EVERYDAY AT BEDTIME Ria Bush, MD Taking Active   [facility] INHUB 250-50 MCG/ACT AEPB [id number] Yes INHALE 1 PUFF BY MOUTH INTO LUNGS in THE morning AND AT bedtime Ria Bush, MD Taking Active             Patient Active Problem List   Diagnosis Date Noted   Small airways disease 11/09/2021   Exertional dyspnea 11/06/2021   Other fatigue 09/26/2021   Left-sided weakness    Stroke ([place]) 09/11/2021   Bradycardia 04/27/2021   Left hip pain 03/14/2021   Urge urinary incontinence 03/06/2021    Medicare annual wellness visit, subsequent 03/05/2021   Advanced directives, counseling/discussion 03/05/2021   Seborrheic keratosis 12/13/2020   Abdominal bloating 05/12/2020   Syncope 03/31/2020   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   Mass of leg, left 10/23/2018   History of cerebellar stroke 10/08/2018   Small vessel disease, 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   Carotid stenosis, bilateral 10/08/2018   Thyroid nodule 04/14/2018   DNR no code (do not resuscitate) 03/20/2017   Vaginal atrophy 02/18/2017   Aortic atherosclerosis ([place]) 09/05/2016   Decreased hearing, bilateral 12/04/2015   Health maintenance examination 12/04/2015   Hypertension    ASCUS with positive high risk human papillomavirus of vagina    GERD (gastroesophageal reflux disease)    OSA on CPAP    Hypertrophy of nasal turbinates    Hyperlipidemia 03/28/2015   Allergic rhinitis 03/28/2015   History of decompression of median nerve 02/14/2014   Osteopenia 06/09/2013    Immunization History  Administered Date(s) Administered   Fluad Quad(high Dose 65+) 05/12/2020, 06/08/2021   Influenza, High Dose Seasonal PF 06/27/2017, 05/21/2018   Influenza, Quadrivalent, Recombinant, Inj, Pf 07/05/2019   Influenza, Seasonal, Injecte, Preservative Fre 05/25/2015   Influenza-Unspecified 06/12/2011, 05/10/2014, 06/22/2016, 06/27/2017   Moderna Sars-Covid-2 Vaccination 10/22/2019, 11/19/2019, 07/03/2020   Pneumococcal Conjugate-13 11/30/2014   Pneumococcal Polysaccharide-23 08/13/2011, 10/01/2013   Td 06/09/2002   Tdap 09/10/2011, 08/13/2012   Zoster Recombinat (Shingrix) 04/27/2018, 06/29/2018   Zoster, Live 08/13/2012    Conditions to be addressed/monitored:  Hypertension, Hyperlipidemia, Coronary Artery Disease, and GERD, hx Stroke  Care Plan : [place]  Updates made by Charlton Haws, RPH since 03/08/2022 12:00 AM     Problem: Hypertension, Hyperlipidemia, Coronary Artery  Disease, and GERD, hx Stroke   Priority: High     Long-Range Goal: Disease mgmt   Start Date: 12/06/2021  Expected End Date: 12/07/2022  This Visit's Progress: On track  Recent Progress: On track  Priority: High  Note:   Current Barriers:  Not taking amlodipine  Pharmacist Clinical Goal(s):  Patient will contact provider office for questions/concerns as evidenced notation of same in electronic health record through collaboration with PharmD and provider.   Interventions: 1:1 collaboration with Ria Bush, MD regarding development and update of comprehensive plan of care as evidenced by provider attestation and co-signature Inter-disciplinary care team collaboration (see longitudinal plan of care) Comprehensive medication review performed; medication list updated in electronic medical record  Hypertension (BP goal <140/90) -Query controlled - pt stopped amlodipine 3 months ago due to thinking it had been stopped in the hospital in Jan, however it was renewed by PCP in Jan; pt is not checking BP at home right now -Current treatment: Amlodipine 5 mg daily - not taking -Medications previously tried: n/a  -Educated on BP goals and benefits of medications for prevention of heart attack, stroke and kidney damage; -Counseled to monitor BP at home daily x 2 weeks; will call back in 2 weeks to determine if amlodipine should be restarted  Hyperlipidemia / CVA (LDL goal < 70) -Controlled - LDL 66 (09/2021) at goal; pt started DAPT Jan 2023 after stroke, original plan was for 3 weeks of DAPT followed by Plavix monotherapy, however pt saw cardiology Feb 2023 and advised to continue aspirin and Plavix indefinitely with hx of aortic atherosclerosis, PVD. -Hx CVA 09/2021; hx PVD; hx aortic atherosclerosis -Current treatment: Rosuvastatin 10 mg daily HS - Appropriate, Effective, Safe, Accessible Clopidogrel 75 mg daily -Appropriate, Effective, Safe, Accessible Aspirin 81 mg  - Appropriate,  Effective, Safe, Accessible -Medications previously tried: n/a  -Educated on Cholesterol goals; Benefits of statin for ASCVD risk reduction; -Recommended to continue current medication  GERD (Goal: manage symptoms) -Controlled - pt reports pantoprazole is not quite as effective as omeprazole (PPI was switched after Plavix initiation due to DDI) -Current treatment  Pantoprazole 40 mg daily - Appropriate, Effective, Safe, Accessible Famotidine 20 mg daily - Appropriate, Effective, Safe, Accessible -Medications previously tried: omeprazole  -Advised to use Famotidine up to BID for breakthrough symptoms -Recommended to continue current medication  SOB (Goal: manage symptoms) -Controlled - pt reports doing much better with SOB since starting [place]; pt has PFTs scheduled for tomorrow -Current treatment  Wixela 1 puff BID - Appropriate, Effective, Safe, Accessible -Medications previously tried: n/a  -Recommended to continue current medication  Patient Goals/Self-Care Activities Patient will:  - take medications as prescribed as evidenced by patient report and record review focus on medication adherence by routine check blood pressure periodically, document, and provide at future appointments        Medication Assistance: None required.  Patient affirms current coverage meets needs.  Compliance/Adherence/Medication fill history: Care Gaps: None  Star-Rating Drugs: Rosuvastatin - PDC 100%  Medication Access: Within the past 30 days, how often has patient missed a dose of medication? 0 Is a pillbox or other method used to improve adherence? No  Factors that may affect medication adherence? lack of understanding of disease management Are meds synced by current pharmacy? Yes  Are meds delivered by current pharmacy? Yes  Does patient experience delays in picking up medications  due to transportation concerns? No   [facility] Reviewed: Is patient disadvantaged to use  [facility]?: No  Current Rx insurance plan: HTA Name and location of Current pharmacy:  [facility] - [place], [place] - [address] [place] [address] Phone: [phone] Fax: [phone]  [facility] services reviewed with patient today?: Yes  90 Days Packs (delivery 12/20/21) Donepezil 10 mg one daily - 1 bedtime Famotidine 20 mg one daily - 1 breakfast, 1 evening meal Memantine 10 mg - 1 breakfast, 1 evening meal   Rosuvastatin 10 mg one daily - 1 bedtime Myrbetriq 25 mg -- take 1 tablet at breakfast  Clopidogrel 75 mg - breakfast   VIAL medications: Fluticasone nasal spray- 2 sprays each nostril PRN Pantoprazole 50m-take 1 tablet daily as needed    Patient declined the following medications:             Amlodipine 533m-stopped in hospital              Plavix 7571mpicked up 90ds 11/14/21             Aspirin 46m25mtting OTC             Wixela inhaler- 1 puff 2 times daily -patient has supply on hand   Care Plan and Follow Up Patient Decision:  Patient agrees to Care Plan and Follow-up.  Plan: Telephone follow up appointment with care management team member scheduled for:  3 months  LindCharlene BrookearmD, BCACP Clinical Pharmacist [facility]-[phone]

## 2022-03-07 ENCOUNTER — Other Ambulatory Visit: Payer: Self-pay | Admitting: Family Medicine

## 2022-03-08 DIAGNOSIS — I10 Essential (primary) hypertension: Secondary | ICD-10-CM | POA: Diagnosis not present

## 2022-03-08 DIAGNOSIS — E785 Hyperlipidemia, unspecified: Secondary | ICD-10-CM | POA: Diagnosis not present

## 2022-03-08 NOTE — Patient Instructions (Signed)
Visit Information  Phone number for Pharmacist: [phone]   Goals Addressed   None     Care Plan : CCM Pharmacy Care Plan  Updates made by Kathyrn Sheriff, [facility] since 03/08/2022 12:00 AM     Problem: Hypertension, Hyperlipidemia, Coronary Artery Disease, and GERD, hx Stroke   Priority: High     Long-Range Goal: Disease mgmt   Start Date: 12/06/2021  Expected End Date: 12/07/2022  This Visit's Progress: On track  Recent Progress: On track  Priority: High  Note:   Current Barriers:  Not taking amlodipine  Pharmacist Clinical Goal(s):  Patient will contact provider office for questions/concerns as evidenced notation of same in electronic health record through collaboration with PharmD and provider.   Interventions: 1:1 collaboration with [name], MD regarding development and update of comprehensive plan of care as evidenced by provider attestation and co-signature Inter-disciplinary care team collaboration (see longitudinal plan of care) Comprehensive medication review performed; medication list updated in electronic medical record  Hypertension (BP goal <140/90) -Query controlled - pt stopped amlodipine 3 months ago due to thinking it had been stopped in the hospital in Jan, however it was renewed by PCP in Jan; pt is not checking BP at home right now -Current treatment: Amlodipine 5 mg daily - not taking -Medications previously tried: n/a  -Educated on BP goals and benefits of medications for prevention of heart attack, stroke and kidney damage; -Counseled to monitor BP at home daily x 2 weeks; will call back in 2 weeks to determine if amlodipine should be restarted  Hyperlipidemia / CVA (LDL goal < 70) -Controlled - LDL 66 (09/2021) at goal; pt started DAPT Jan 2023 after stroke, original plan was for 3 weeks of DAPT followed by Plavix monotherapy, however pt saw cardiology Feb 2023 and advised to continue aspirin and Plavix indefinitely with hx of aortic  atherosclerosis, PVD. -Hx CVA 09/2021; hx PVD; hx aortic atherosclerosis -Current treatment: Rosuvastatin 10 mg daily HS - Appropriate, Effective, Safe, Accessible Clopidogrel 75 mg daily -Appropriate, Effective, Safe, Accessible Aspirin 81 mg  - Appropriate, Effective, Safe, Accessible -Medications previously tried: n/a  -Educated on Cholesterol goals; Benefits of statin for ASCVD risk reduction; -Recommended to continue current medication  GERD (Goal: manage symptoms) -Controlled - pt reports pantoprazole is not quite as effective as omeprazole (PPI was switched after Plavix initiation due to DDI) -Current treatment  Pantoprazole 40 mg daily - Appropriate, Effective, Safe, Accessible Famotidine 20 mg daily - Appropriate, Effective, Safe, Accessible -Medications previously tried: omeprazole  -Advised to use Famotidine up to BID for breakthrough symptoms -Recommended to continue current medication  SOB (Goal: manage symptoms) -Controlled - pt reports doing much better with SOB since starting Wixela; pt has PFTs scheduled for tomorrow -Current treatment  Wixela 1 puff BID - Appropriate, Effective, Safe, Accessible -Medications previously tried: n/a  -Recommended to continue current medication  Patient Goals/Self-Care Activities Patient will:  - take medications as prescribed as evidenced by patient report and record review focus on medication adherence by routine check blood pressure periodically, document, and provide at future appointments       Patient verbalizes understanding of instructions and care plan provided today and agrees to view in MyChart. Active MyChart status and patient understanding of how to access instructions and care plan via MyChart confirmed with patient.    Telephone follow up appointment with pharmacy team member scheduled for: 3 months  Al Corpus, PharmD, [facility] Clinical Pharmacist [facility] [phone]

## 2022-03-11 ENCOUNTER — Telehealth: Payer: Self-pay

## 2022-03-11 NOTE — Chronic Care Management (AMB) (Signed)
Chronic Care Management Pharmacy Assistant   Name: Kathy Baldwin  MRN: [id number] DOB: 08-27-1946  Reason for Encounter: Medication Adherence and Delivery Coordination    Recent office visits:  None since last CCM contact   Recent consult visits:  None since last CCM contact   Hospital visits:  None in previous 6 months  Medications: Outpatient Encounter Medications as of 03/11/2022  Medication Sig   amLODipine (NORVASC) 5 MG tablet Take 1 tablet (5 mg total) by mouth daily. (Patient not taking: Reported on 03/06/2022)   aspirin EC 81 MG EC tablet Take 1 tablet (81 mg total) by mouth daily. Swallow whole.   Calcium Carb-Cholecalciferol (CALCIUM 600+D) 600-800 MG-UNIT TABS Take by mouth daily. 2 capsule daily   clopidogrel (PLAVIX) 75 MG tablet Take 1 tablet (75 mg total) by mouth daily.   COLLAGEN PO Take by mouth.   conjugated estrogens (PREMARIN) vaginal cream Use a pea-sized amount externally and internally twice a week at night   Cyanocobalamin (B-12) 2500 MCG TABS Take 1 tablet by mouth daily. Pt taking 1/2 of a 5000 mcg tablet   donepezil (ARICEPT) 10 MG tablet Take 10 mg by mouth at bedtime.    famotidine (PEPCID) 20 MG tablet TAKE ONE TABLET BY MOUTH TWICE DAILY for heartburn/indigestion   FIBER PO Take by mouth.   fluticasone (FLONASE) 50 MCG/ACT nasal spray USE TWO SPRAYS in each nostril daily(shake liquid)   meclizine (ANTIVERT) 25 MG tablet Take 1 tablet (25 mg total) by mouth 3 (three) times daily as needed for dizziness.   memantine (NAMENDA) 10 MG tablet Take 10 mg by mouth 2 (two) times daily.   Multiple Vitamin (MULTIVITAMIN) tablet Take 1 tablet by mouth daily.   Multiple Vitamins-Minerals (HAIR/SKIN/NAILS/BIOTIN PO) Take 1 tablet by mouth daily.   MYRBETRIQ 25 MG TB24 tablet TAKE ONE TABLET BY MOUTH ONCE DAILY   pantoprazole (PROTONIX) 40 MG tablet Take 1 tablet (40 mg total) by mouth daily as needed (GERD symptoms).   PEDIASURE/FIBER (PEDIASURE/FIBER)  LIQD Take 10 mLs by mouth.   polyethylene glycol (MIRALAX / GLYCOLAX) packet Take 17 g by mouth daily.   rosuvastatin (CRESTOR) 10 MG tablet TAKE ONE TABLET BY MOUTH EVERYDAY AT BEDTIME   WIXELA INHUB 250-50 MCG/ACT AEPB INHALE 1 PUFF BY MOUTH INTO LUNGS in THE morning AND AT bedtime   No facility-administered encounter medications on file as of 03/11/2022.    BP Readings from Last 3 Encounters:  01/07/22 110/72  12/20/21 120/70  11/19/21 110/80    Lab Results  Component Value Date   HGBA1C 5.6 09/11/2021      No OVs, Consults, or hospital visits since last care coordination call / Pharmacist visit. No medication changes indicated   Last adherence delivery date:12/20/21      Patient is due for next adherence delivery on: 03/21/22  Spoke with patient on 03/11/22 reviewed medications and coordinated delivery.  This delivery to include: Adherence Packaging  90 Days  Packs: Donepezil 10 mg one daily - 1 bedtime Famotidine 20 mg one daily - 1 breakfast, 1 evening meal Memantine 10 mg - 1 breakfast, 1 evening meal   Rosuvastatin 10 mg one daily - 1 bedtime Myrbetriq 25 mg -- take 1 tablet at breakfast  Clopidogrel 75mg - take 1 tablet breakfast Pantoprazole 40mg - take 1 tablet breakfast VIAL medications:  Fluticasone nasal spray- 2 sprays in each nostril as needed   Patient declined the following medications this month:  Amlodipine 5mg  - not due till  04/10/22  Wixela inhaler- 1 puff 2 times daily -patient has supply on hand   Aspirin 81mg  getting OTC  Any concerns about your medications? No  How often do you forget or accidentally miss a dose? Rarely on occasion if in hospital   Do you use a pillbox? No  Is patient in packaging Yes  What is the date on your next pill pack?not near packs to read date   Any concerns or issues with your packaging? The patient is satisfied    Refills requested from providers include: rosuvastatin , famoditine  Confirmed delivery date of  03/21/22, advised patient that pharmacy will contact them the morning of delivery.  Recent blood pressure readings are as follows:none available    Annual wellness visit in last year? Yes Most Recent BP reading:110/80  63-P    Cycle dispensing form sent to :  Madeline Pryor,CPP   03/23/22, [facility] concierge  [phone]

## 2022-03-13 ENCOUNTER — Encounter: Payer: Self-pay | Admitting: Family Medicine

## 2022-03-13 ENCOUNTER — Ambulatory Visit (INDEPENDENT_AMBULATORY_CARE_PROVIDER_SITE_OTHER): Payer: PPO

## 2022-03-13 VITALS — Ht 63.0 in | Wt 145.0 lb

## 2022-03-13 DIAGNOSIS — Z0000 Encounter for general adult medical examination without abnormal findings: Secondary | ICD-10-CM | POA: Diagnosis not present

## 2022-03-13 NOTE — Progress Notes (Signed)
Subjective:   Kathy Baldwin is a 76 y.o. female who presents for Medicare Annual (Subsequent) preventive examination.  Review of Systems    Virtual Visit via Telephone Note  I connected with  Kathy Baldwin on 03/13/22 at 10:00 AM EDT by telephone and verified that I am speaking with the correct person using two identifiers.  Location: Patient: Home Provider: Office Persons participating in the virtual visit: patient/Nurse Health Advisor   I discussed the limitations, risks, security and privacy concerns of performing an evaluation and management service by telephone and the availability of in person appointments. The patient expressed understanding and agreed to proceed.  Interactive audio and video telecommunications were attempted between this nurse and patient, however failed, due to patient having technical difficulties OR patient did not have access to video capability.  We continued and completed visit with audio only.  Some vital signs may be absent or patient reported.   Kathy RungBeverly W Tyliek Timberman, LPN  Cardiac Risk Factors include: advanced age (>2255men, 71>65 women);hypertension     Objective:    Today's Vitals   03/13/22 0959  Weight: 145 lb (65.8 kg)  Height: 5\' 3"  (1.6 m)   Body mass index is 25.69 kg/m.     03/13/2022   10:07 AM 09/11/2021    7:02 PM 09/11/2021    7:00 PM 04/20/2019    2:45 PM 10/28/2018    8:48 AM 09/30/2018    7:39 AM 04/17/2018    2:20 PM  Advanced Directives  Does Patient Have a Medical Advance Directive? Yes  No Yes Yes Yes Yes  Type of Estate agentAdvance Directive Healthcare Power of [place];Living will   Healthcare Power of [place];Living will Healthcare Power of [place];Living will Healthcare Power of [place];Living will Healthcare Power of [place];Living will  Does patient want to make changes to medical advance directive? No - Patient declined    No - Patient declined    Copy of Healthcare Power of Attorney in Chart? No - copy requested   No -  copy requested No - copy requested No - copy requested No - copy requested  Would patient like information on creating a medical advance directive?  No - Patient declined         Current Medications (verified) Outpatient Encounter Medications as of 03/13/2022  Medication Sig   amLODipine (NORVASC) 5 MG tablet Take 1 tablet (5 mg total) by mouth daily. (Patient not taking: Reported on 03/06/2022)   aspirin EC 81 MG EC tablet Take 1 tablet (81 mg total) by mouth daily. Swallow whole.   Calcium Carb-Cholecalciferol (CALCIUM 600+D) 600-800 MG-UNIT TABS Take by mouth daily. 2 capsule daily   clopidogrel (PLAVIX) 75 MG tablet Take 1 tablet (75 mg total) by mouth daily.   COLLAGEN PO Take by mouth.   conjugated estrogens (PREMARIN) vaginal cream Use a pea-sized amount externally and internally twice a week at night   Cyanocobalamin (B-12) 2500 MCG TABS Take 1 tablet by mouth daily. Pt taking 1/2 of a 5000 mcg tablet   donepezil (ARICEPT) 10 MG tablet Take 10 mg by mouth at bedtime.    famotidine (PEPCID) 20 MG tablet TAKE ONE TABLET BY MOUTH TWICE DAILY for heartburn/indigestion   FIBER PO Take by mouth.   fluticasone (FLONASE) 50 MCG/ACT nasal spray USE TWO SPRAYS in each nostril daily(shake liquid)   meclizine (ANTIVERT) 25 MG tablet Take 1 tablet (25 mg total) by mouth 3 (three) times daily as needed for dizziness.   memantine (NAMENDA) 10 MG  tablet Take 10 mg by mouth 2 (two) times daily.   Multiple Vitamin (MULTIVITAMIN) tablet Take 1 tablet by mouth daily.   Multiple Vitamins-Minerals (HAIR/SKIN/NAILS/BIOTIN PO) Take 1 tablet by mouth daily.   MYRBETRIQ 25 MG TB24 tablet TAKE ONE TABLET BY MOUTH ONCE DAILY   pantoprazole (PROTONIX) 40 MG tablet Take 1 tablet (40 mg total) by mouth daily as needed (GERD symptoms).   PEDIASURE/FIBER (PEDIASURE/FIBER) LIQD Take 10 mLs by mouth.   polyethylene glycol (MIRALAX / GLYCOLAX) packet Take 17 g by mouth daily.   rosuvastatin (CRESTOR) 10 MG tablet TAKE  ONE TABLET BY MOUTH EVERYDAY AT BEDTIME   WIXELA INHUB 250-50 MCG/ACT AEPB INHALE 1 PUFF BY MOUTH INTO LUNGS in THE morning AND AT bedtime   No facility-administered encounter medications on file as of 03/13/2022.    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 Holly Bodily 2010   DNR no code (do not resuscitate) 03/20/2017   GERD (gastroesophageal reflux disease)    Hearing loss    History of gallstones    Hyperlipidemia    Hypertension    Hypertrophy of nasal turbinates    Leg mass, left    in calf muscle seeing surgeon tomorrow regarding   Lymphocytosis    Menopause age 51   Multiple food allergies    chocolate and cabbage   OSA on CPAP    Osteopenia Oct. 2014   Sleep apnea    Stroke (HCC) 09/11/2021   Thyroid nodule    Vertigo    Vitamin D deficiency disease    Past Surgical History:  Procedure Laterality Date   BREAST CYST ASPIRATION Right    negative   CARPAL TUNNEL RELEASE Right 2015   CATARACT EXTRACTION W/PHACO Left 09/30/2018   Procedure: CATARACT EXTRACTION PHACO AND INTRAOCULAR LENS PLACEMENT (IOC)  LEFT;  Surgeon: Lockie Mola, MD;  [facility]: [facility];  Service: Ophthalmology;  Laterality: Left;  sleep apnea   CATARACT EXTRACTION W/PHACO Right 10/28/2018   Procedure: CATARACT EXTRACTION PHACO AND INTRAOCULAR LENS PLACEMENT (IOC)  RIGHT;  Surgeon: Lockie Mola, MD;  Location: [facility];  Service: Ophthalmology;  Laterality: Right;   CHOLECYSTECTOMY  1990   COLONOSCOPY  04/25/2008   CYST EXCISION Left 2020   L popliteal/peroneal cyst excision s/p residual numbness   DILATION AND CURETTAGE OF UTERUS  1982   s/p  miscarriage   ESOPHAGOGASTRODUODENOSCOPY  08/2020   chronic gastritis, neg H pylori, no Barretts, no rpt needed [facility])   Family History  Problem Relation Age of Onset   Healthy Mother    Arthritis Mother    Lung disease Father        black lung   Heart disease Father    Hyperlipidemia Father    Hypertension Father    Emphysema Sister    Healthy Sister    Cancer Brother        unsure of type   Asthma Daughter    Heart disease Sister    Thyroid disease Sister    Stroke Maternal Grandmother    Hypertension Maternal Grandmother    Diabetes Maternal Grandmother    Diabetes Sister  Healthy Sister    Breast cancer Neg Hx    Social History   Socioeconomic History   Marital status: Married    Spouse name: [name]   Number of children: 1   Years of education: Not on file   Highest education level: Bachelor's degree (e.g., BA, AB, BS)  Occupational History   Occupation: Retired  Tobacco Use   Smoking status: Never   Smokeless tobacco: Never   Tobacco comments:    Never smoked, so cannot quit...  Vaping Use   Vaping Use: Never used  Substance and Sexual Activity   Alcohol use: Not Currently    Alcohol/week: 0.0 standard drinks of alcohol    Comment: Occasional celebratory drink (holidays, etc.) only.   Drug use: No   Sexual activity: Yes    Partners: Male    Birth control/protection: Post-menopausal  Other Topics Concern   Not on file  Social History Narrative   Lives with husband [name]   Occ: retired Diplomatic Services operational officer   Edu: BA   Act:    Diet:    Social Determinants of Corporate investment banker Strain: Low Risk  (03/13/2022)   Overall Financial Resource Strain (CARDIA)    Difficulty of Paying Living Expenses: Not hard at all  Food Insecurity: No Food Insecurity (03/13/2022)   Hunger Vital Sign    Worried About Running Out of Food in the Last Year: Never true    Ran Out of Food in the Last Year: Never true  Transportation Needs: No Transportation Needs  (03/13/2022)   PRAPARE - Administrator, Civil Service (Medical): No    Lack of Transportation (Non-Medical): No  Physical Activity: Inactive (03/13/2022)   Exercise Vital Sign    Days of Exercise per Week: 0 days    Minutes of Exercise per Session: 0 min  Stress: No Stress Concern Present (03/13/2022)   [employer] of Occupational Health - Occupational Stress Questionnaire    Feeling of Stress : Not at all  Social Connections: Moderately Isolated (03/13/2022)   Social Connection and Isolation Panel [NHANES]    Frequency of Communication with Friends and Family: More than three times a week    Frequency of Social Gatherings with Friends and Family: More than three times a week    Attends Religious Services: Never    Database administrator or Organizations: No    Attends Banker Meetings: Never    Marital Status: Married    Tobacco Counseling Counseling given: Not Answered Tobacco comments: Never smoked, so cannot quit...   Clinical Intake: How often do you need to have someone help you when you read instructions, pamphlets, or other written materials from your doctor or pharmacy?: 1 - Never  Diabetic?  No    Activities of Daily Living    03/13/2022   10:05 AM 09/11/2021    6:45 PM  In your present state of health, do you have any difficulty performing the following activities:  Hearing? 1 0  Comment Wears hearing aids   Vision? 0 0  Difficulty concentrating or making decisions? 0 0  Walking or climbing stairs? 0 0  Dressing or bathing? 0 0  Doing errands, shopping? 0 0  Preparing Food and eating ? N   Using the Toilet? N   In the past six months, have you accidently leaked urine? Y   Comment Wears pads. Followed by PCP   Do you have problems with loss of bowel control? N  Managing your Medications? N   Managing your Finances? N   Housekeeping or managing your Housekeeping? N     Patient Care Team: [name], MD as PCP - General  (Family Medicine) Midge Minium, MD as Consulting Physician (Gastroenterology) [facility], [facility] (Optometry) Solum, Marlana Salvage, MD as Physician Assistant (Endocrinology) Linus Salmons, MD as Consulting Physician (Otolaryngology) Lockie Mola, MD as Referring Physician (Ophthalmology) Kathyrn Sheriff, [facility] as Pharmacist (Pharmacist)  Indicate any recent Medical Services you may have received from other than [facility] providers in the past year (date may be approximate).     Assessment:   This is a routine wellness examination for Avin.  Hearing/Vision screen Hearing Screening - Comments:: Wears hearing aids Vision Screening - Comments:: Wears glasses. Followed by [facility]  Dietary issues and exercise activities discussed: Exercise limited by: None identified   Goals Addressed               This Visit's Progress     Keep weight down (pt-stated)         Depression Screen    03/13/2022   10:03 AM 03/05/2021    9:55 AM 08/17/2019   12:13 PM 04/20/2019    2:48 PM 10/08/2018   11:09 AM 04/17/2018    2:41 PM 03/09/2018    3:14 PM  PHQ 2/9 Scores  PHQ - 2 Score 0 0 0 0 0 0 0  PHQ- 9 Score  0 1  0 0     Fall Risk    03/13/2022   10:06 AM 03/05/2021    9:54 AM 04/20/2019    2:48 PM 10/08/2018   11:09 AM 04/17/2018    2:20 PM  Fall Risk   Falls in the past year? 0 0 0 1 No  Comment     removing dead leaves from a fig tree and fell to the ground  Number falls in past yr: 0 0 0 0 1  Injury with Fall? 0 0 0 0 No  Risk for fall due to : No Fall Risks    Impaired vision;Other (Comment);Impaired balance/gait;History of fall(s)  Risk for fall due to: Comment     wears eyeglasses; vertigo  Follow up   Falls prevention discussed  Falls evaluation completed;Education provided;Falls prevention discussed    FALL RISK PREVENTION PERTAINING TO THE HOME:  Any stairs in or around the home? Yes  If so, are there any without handrails? No  Home free of loose throw rugs in  walkways, pet beds, electrical cords, etc? Yes  Adequate lighting in your home to reduce risk of falls? Yes   ASSISTIVE DEVICES UTILIZED TO PREVENT FALLS:  Life alert? No  Use of a cane, walker or w/c? No  Grab bars in the bathroom? Yes  Shower chair or bench in shower? No Elevated toilet seat or a handicapped toilet? No   TIMED UP AND GO:  Was the test performed? No . Audio Visit  Cognitive Function:        03/13/2022   10:08 AM 04/17/2018    2:21 PM 03/20/2017   11:27 AM  6CIT Screen  What Year? 0 points 0 points 0 points  What month? 0 points 0 points 0 points  What time? 0 points 0 points 0 points  Count back from 20 0 points 0 points 0 points  Months in reverse 0 points 0 points 0 points  Repeat phrase 0 points 0 points 0 points  Total Score 0 points 0 points  0 points    Immunizations Immunization History  Administered Date(s) Administered   Fluad Quad(high Dose 65+) 05/12/2020, 06/08/2021   Influenza, High Dose Seasonal PF 06/27/2017, 05/21/2018   Influenza, Quadrivalent, Recombinant, Inj, Pf 07/05/2019   Influenza, Seasonal, Injecte, Preservative Fre 05/25/2015   Influenza-Unspecified 06/12/2011, 05/10/2014, 06/22/2016, 06/27/2017   Moderna Sars-Covid-2 Vaccination 10/22/2019, 11/19/2019, 07/03/2020   Pneumococcal Conjugate-13 11/30/2014   Pneumococcal Polysaccharide-23 08/13/2011, 10/01/2013   Td 06/09/2002   Tdap 09/10/2011, 08/13/2012   Zoster Recombinat (Shingrix) 04/27/2018, 06/29/2018   Zoster, Live 08/13/2012    TDAP status: Up to date  Flu Vaccine status: Up to date  Pneumococcal vaccine status: Up to date  Covid-19 vaccine status: Completed vaccines  Qualifies for Shingles Vaccine? Yes   Zostavax completed Yes   Shingrix Completed?: Yes  Screening Tests Health Maintenance  Topic Date Due   MAMMOGRAM  03/09/2022   COVID-19 Vaccine (4 - Booster for Moderna series) 03/29/2022 (Originally 08/28/2020)   INFLUENZA VACCINE  04/09/2022    TETANUS/TDAP  08/13/2022   Pneumonia Vaccine 60+ Years old  Completed   DEXA SCAN  Completed   Hepatitis C Screening  Completed   Zoster Vaccines- Shingrix  Completed   HPV VACCINES  Aged Out   Fecal DNA (Cologuard)  Discontinued    Health Maintenance  Health Maintenance Due  Topic Date Due   MAMMOGRAM  03/09/2022    Colorectal cancer screening: No longer required.   Mammogram status: No longer required due to Age.  Bone Density status: Completed 03/29/20. Results reflect: Bone density results: OSTEOPOROSIS. Repeat every   years.  Lung Cancer Screening: (Low Dose CT Chest recommended if Age 31-80 years, 30 pack-year currently smoking OR have quit w/in 15years.) does not qualify.     Additional Screening:  Hepatitis C Screening: does qualify; Completed 08/29/15  Vision Screening: Recommended annual ophthalmology exams for early detection of glaucoma and other disorders of the eye. Is the patient up to date with their annual eye exam?  Yes  Who is the provider or what is the name of the office in which the patient attends annual eye exams? [facility] If pt is not established with a provider, would they like to be referred to a provider to establish care? No .   Dental Screening: Recommended annual dental exams for proper oral hygiene  Community Resource Referral / Chronic Care Management:  CRR required this visit?  No   CCM required this visit?  No      Plan:     I have personally reviewed and noted the following in the patient's chart:   Medical and social history Use of alcohol, tobacco or illicit drugs  Current medications and supplements including opioid prescriptions.  Functional ability and status Nutritional status Physical activity Advanced directives List of other physicians Hospitalizations, surgeries, and ER visits in previous 12 months Vitals Screenings to include cognitive, depression, and falls Referrals and appointments  In addition, I  have reviewed and discussed with patient certain preventive protocols, quality metrics, and best practice recommendations. A written personalized care plan for preventive services as well as general preventive health recommendations were provided to patient.     Kathy Rung, LPN   10/17/7865   Nurse Notes: None

## 2022-03-13 NOTE — Patient Instructions (Addendum)
Ms. Kathy Baldwin , Thank you for taking time to come for your Medicare Wellness Visit. I appreciate your ongoing commitment to your health goals. Please review the following plan we discussed and let me know if I can assist you in the future.   These are the goals we discussed:  Goals       DIET - INCREASE WATER INTAKE      Recommend to drink at least 6-8 8oz glasses of water per day.      Keep weight down (pt-stated)        This is a list of the screening recommended for you and due dates:  Health Maintenance  Topic Date Due   Mammogram  03/09/2022   COVID-19 Vaccine (4 - Booster for Moderna series) 03/29/2022*   Flu Shot  04/09/2022   Tetanus Vaccine  08/13/2022   Pneumonia Vaccine  Completed   DEXA scan (bone density measurement)  Completed   Hepatitis C Screening: USPSTF Recommendation to screen - Ages 78-79 yo.  Completed   Zoster (Shingles) Vaccine  Completed   HPV Vaccine  Aged Out   Cologuard (Stool DNA test)  Discontinued  *Topic was postponed. The date shown is not the original due date.   Advanced directives: Yes  Conditions/risks identified: None  Next appointment: Follow up in one year for your annual wellness visit     Preventive Care 65 Years and Older, Female Preventive care refers to lifestyle choices and visits with your health care provider that can promote health and wellness. What does preventive care include? A yearly physical exam. This is also called an annual well check. Dental exams once or twice a year. Routine eye exams. Ask your health care provider how often you should have your eyes checked. Personal lifestyle choices, including: Daily care of your teeth and gums. Regular physical activity. Eating a healthy diet. Avoiding tobacco and drug use. Limiting alcohol use. Practicing safe sex. Taking low-dose aspirin every day. Taking vitamin and mineral supplements as recommended by your health care provider. What happens during an annual well  check? The services and screenings done by your health care provider during your annual well check will depend on your age, overall health, lifestyle risk factors, and family history of disease. Counseling  Your health care provider may ask you questions about your: Alcohol use. Tobacco use. Drug use. Emotional well-being. Home and relationship well-being. Sexual activity. Eating habits. History of falls. Memory and ability to understand (cognition). Work and work Astronomer. Reproductive health. Screening  You may have the following tests or measurements: Height, weight, and BMI. Blood pressure. Lipid and cholesterol levels. These may be checked every 5 years, or more frequently if you are over 37 years old. Skin check. Lung cancer screening. You may have this screening every year starting at age 78 if you have a 30-pack-year history of smoking and currently smoke or have quit within the past 15 years. Fecal occult blood test (FOBT) of the stool. You may have this test every year starting at age 32. Flexible sigmoidoscopy or colonoscopy. You may have a sigmoidoscopy every 5 years or a colonoscopy every 10 years starting at age 33. Hepatitis C blood test. Hepatitis B blood test. Sexually transmitted disease (STD) testing. Diabetes screening. This is done by checking your blood sugar (glucose) after you have not eaten for a while (fasting). You may have this done every 1-3 years. Bone density scan. This is done to screen for osteoporosis. You may have this done starting at  age 78. Mammogram. This may be done every 1-2 years. Talk to your health care provider about how often you should have regular mammograms. Talk with your health care provider about your test results, treatment options, and if necessary, the need for more tests. Vaccines  Your health care provider may recommend certain vaccines, such as: Influenza vaccine. This is recommended every year. Tetanus, diphtheria, and  acellular pertussis (Tdap, Td) vaccine. You may need a Td booster every 10 years. Zoster vaccine. You may need this after age 58. Pneumococcal 13-valent conjugate (PCV13) vaccine. One dose is recommended after age 70. Pneumococcal polysaccharide (PPSV23) vaccine. One dose is recommended after age 7. Talk to your health care provider about which screenings and vaccines you need and how often you need them. This information is not intended to replace advice given to you by your health care provider. Make sure you discuss any questions you have with your health care provider. Document Released: 09/22/2015 Document Revised: 05/15/2016 Document Reviewed: 06/27/2015 Elsevier Interactive Patient Education  2017 [place] Prevention in the Home Falls can cause injuries. They can happen to people of all ages. There are many things you can do to make your home safe and to help prevent falls. What can I do on the outside of my home? Regularly fix the edges of walkways and driveways and fix any cracks. Remove anything that might make you trip as you walk through a door, such as a raised step or threshold. Trim any bushes or trees on the path to your home. Use bright outdoor lighting. Clear any walking paths of anything that might make someone trip, such as rocks or tools. Regularly check to see if handrails are loose or broken. Make sure that both sides of any steps have handrails. Any raised decks and porches should have guardrails on the edges. Have any leaves, snow, or ice cleared regularly. Use sand or salt on walking paths during winter. Clean up any spills in your garage right away. This includes oil or grease spills. What can I do in the bathroom? Use night lights. Install grab bars by the toilet and in the tub and shower. Do not use towel bars as grab bars. Use non-skid mats or decals in the tub or shower. If you need to sit down in the shower, use a plastic, non-slip stool. Keep  the floor dry. Clean up any water that spills on the floor as soon as it happens. Remove soap buildup in the tub or shower regularly. Attach bath mats securely with double-sided non-slip rug tape. Do not have throw rugs and other things on the floor that can make you trip. What can I do in the bedroom? Use night lights. Make sure that you have a light by your bed that is easy to reach. Do not use any sheets or blankets that are too big for your bed. They should not hang down onto the floor. Have a firm chair that has side arms. You can use this for support while you get dressed. Do not have throw rugs and other things on the floor that can make you trip. What can I do in the kitchen? Clean up any spills right away. Avoid walking on wet floors. Keep items that you use a lot in easy-to-reach places. If you need to reach something above you, use a strong step stool that has a grab bar. Keep electrical cords out of the way. Do not use floor polish or wax that makes floors  slippery. If you must use wax, use non-skid floor wax. Do not have throw rugs and other things on the floor that can make you trip. What can I do with my stairs? Do not leave any items on the stairs. Make sure that there are handrails on both sides of the stairs and use them. Fix handrails that are broken or loose. Make sure that handrails are as long as the stairways. Check any carpeting to make sure that it is firmly attached to the stairs. Fix any carpet that is loose or worn. Avoid having throw rugs at the top or bottom of the stairs. If you do have throw rugs, attach them to the floor with carpet tape. Make sure that you have a light switch at the top of the stairs and the bottom of the stairs. If you do not have them, ask someone to add them for you. What else can I do to help prevent falls? Wear shoes that: Do not have high heels. Have rubber bottoms. Are comfortable and fit you well. Are closed at the toe. Do not  wear sandals. If you use a stepladder: Make sure that it is fully opened. Do not climb a closed stepladder. Make sure that both sides of the stepladder are locked into place. Ask someone to hold it for you, if possible. Clearly mark and make sure that you can see: Any grab bars or handrails. First and last steps. Where the edge of each step is. Use tools that help you move around (mobility aids) if they are needed. These include: Canes. Walkers. Scooters. Crutches. Turn on the lights when you go into a dark area. Replace any light bulbs as soon as they burn out. Set up your furniture so you have a clear path. Avoid moving your furniture around. If any of your floors are uneven, fix them. If there are any pets around you, be aware of where they are. Review your medicines with your doctor. Some medicines can make you feel dizzy. This can increase your chance of falling. Ask your doctor what other things that you can do to help prevent falls. This information is not intended to replace advice given to you by your health care provider. Make sure you discuss any questions you have with your health care provider. Document Released: 06/22/2009 Document Revised: 02/01/2016 Document Reviewed: 09/30/2014 Elsevier Interactive Patient Education  2017 [employer].

## 2022-03-18 ENCOUNTER — Ambulatory Visit
Admission: RE | Admit: 2022-03-18 | Discharge: 2022-03-18 | Disposition: A | Discharge status: Home / Self Care | Payer: PPO | Source: Ambulatory Visit | Attending: Family Medicine | Admitting: Family Medicine

## 2022-03-18 DIAGNOSIS — Z1231 Encounter for screening mammogram for malignant neoplasm of breast: Secondary | ICD-10-CM | POA: Diagnosis present

## 2022-03-18 NOTE — Telephone Encounter (Addendum)
Patient has not been taking amlodipine for several months. Pt has been checking BP on and off for the past 2 weeks. She reports BP was 129/65 today, and typically has been 130s/70s. Pt denies readings > 140/90 in the last 2 weeks.  Given relatively controlled BP, it is reasonable to continue without amlodipine. Pt is ok with this as well. Will route to PCP for confirmation.

## 2022-03-18 NOTE — Telephone Encounter (Signed)
Based on home BP readings, reasonable to stay off amlodipine at this time.

## 2022-04-07 ENCOUNTER — Other Ambulatory Visit: Payer: Self-pay | Admitting: Family Medicine

## 2022-04-07 DIAGNOSIS — M8589 Other specified disorders of bone density and structure, multiple sites: Secondary | ICD-10-CM

## 2022-04-07 DIAGNOSIS — E782 Mixed hyperlipidemia: Secondary | ICD-10-CM

## 2022-04-08 ENCOUNTER — Other Ambulatory Visit (INDEPENDENT_AMBULATORY_CARE_PROVIDER_SITE_OTHER): Payer: PPO

## 2022-04-08 DIAGNOSIS — M8589 Other specified disorders of bone density and structure, multiple sites: Secondary | ICD-10-CM

## 2022-04-08 DIAGNOSIS — E782 Mixed hyperlipidemia: Secondary | ICD-10-CM

## 2022-04-08 LAB — LIPID PANEL
Cholesterol: 170 mg/dL (ref 0–200)
HDL: 84.9 mg/dL (ref >39.00)
LDL Cholesterol: 72 mg/dL (ref 0–99)
NonHDL: 84.69
Total CHOL/HDL Ratio: 2
Triglycerides: 65 mg/dL (ref 0.0–149.0)
VLDL: 13 mg/dL (ref 0.0–40.0)

## 2022-04-08 LAB — COMPREHENSIVE METABOLIC PANEL
ALT: 18 U/L (ref 0–35)
AST: 31 U/L (ref 0–37)
Albumin: 4.4 g/dL (ref 3.5–5.2)
Alkaline Phosphatase: 67 U/L (ref 39–117)
BUN: 17 mg/dL (ref 6–23)
CO2: 29 mEq/L (ref 19–32)
Calcium: 9.8 mg/dL (ref 8.4–10.5)
Chloride: 104 mEq/L (ref 96–112)
Creatinine, Ser: 0.97 mg/dL (ref 0.40–1.20)
GFR: 56.31 mL/min — ABNORMAL LOW (ref >60.00)
Glucose, Bld: 96 mg/dL (ref 70–99)
Potassium: 3.9 mEq/L (ref 3.5–5.1)
Sodium: 142 mEq/L (ref 135–145)
Total Bilirubin: 0.8 mg/dL (ref 0.2–1.2)
Total Protein: 7.3 g/dL (ref 6.0–8.3)

## 2022-04-08 LAB — VITAMIN D 25 HYDROXY (VIT D DEFICIENCY, FRACTURES): VITD: 51.72 ng/mL (ref 30.00–100.00)

## 2022-04-17 ENCOUNTER — Encounter: Payer: Self-pay | Admitting: Family Medicine

## 2022-04-17 ENCOUNTER — Ambulatory Visit: Payer: PPO | Admitting: Family Medicine

## 2022-04-17 ENCOUNTER — Ambulatory Visit (INDEPENDENT_AMBULATORY_CARE_PROVIDER_SITE_OTHER): Payer: PPO | Admitting: Family Medicine

## 2022-04-17 ENCOUNTER — Encounter: Payer: PPO | Admitting: Family Medicine

## 2022-04-17 VITALS — BP 134/72 | HR 63 | Temp 98.0°F | Ht 61.5 in | Wt 145.4 lb

## 2022-04-17 DIAGNOSIS — Z0000 Encounter for general adult medical examination without abnormal findings: Secondary | ICD-10-CM | POA: Diagnosis not present

## 2022-04-17 DIAGNOSIS — R195 Other fecal abnormalities: Secondary | ICD-10-CM

## 2022-04-17 DIAGNOSIS — Z7189 Other specified counseling: Secondary | ICD-10-CM | POA: Diagnosis not present

## 2022-04-17 DIAGNOSIS — G3184 Mild cognitive impairment, so stated: Secondary | ICD-10-CM

## 2022-04-17 DIAGNOSIS — I700 Atherosclerosis of aorta: Secondary | ICD-10-CM

## 2022-04-17 DIAGNOSIS — I10 Essential (primary) hypertension: Secondary | ICD-10-CM

## 2022-04-17 DIAGNOSIS — N3941 Urge incontinence: Secondary | ICD-10-CM

## 2022-04-17 DIAGNOSIS — H9193 Unspecified hearing loss, bilateral: Secondary | ICD-10-CM

## 2022-04-17 DIAGNOSIS — E782 Mixed hyperlipidemia: Secondary | ICD-10-CM

## 2022-04-17 DIAGNOSIS — Z8673 Personal history of transient ischemic attack (TIA), and cerebral infarction without residual deficits: Secondary | ICD-10-CM

## 2022-04-17 DIAGNOSIS — Z9989 Dependence on other enabling machines and devices: Secondary | ICD-10-CM

## 2022-04-17 DIAGNOSIS — J984 Other disorders of lung: Secondary | ICD-10-CM

## 2022-04-17 DIAGNOSIS — Z66 Do not resuscitate: Secondary | ICD-10-CM

## 2022-04-17 DIAGNOSIS — K219 Gastro-esophageal reflux disease without esophagitis: Secondary | ICD-10-CM

## 2022-04-17 DIAGNOSIS — G4733 Obstructive sleep apnea (adult) (pediatric): Secondary | ICD-10-CM

## 2022-04-17 DIAGNOSIS — M8589 Other specified disorders of bone density and structure, multiple sites: Secondary | ICD-10-CM

## 2022-04-17 NOTE — Telephone Encounter (Signed)
Noted  

## 2022-04-17 NOTE — Progress Notes (Unsigned)
Patient ID: Kathy Baldwin, female    DOB: 04-Nov-1945, 76 y.o.   MRN: [id number]  This visit was conducted in person.  BP 134/72   Pulse 63   Temp 98 F (36.7 C) (Temporal)   Ht 5' 1.5" (1.562 m)   Wt 145 lb 6 oz (65.9 kg)   SpO2 94%   BMI 27.02 kg/m    CC: CPE Subjective:   HPI: Kathy Baldwin is a 76 y.o. female presenting on 04/17/2022 for Annual Exam (MCR prt 2. )   Saw health advisor 03/2022 for medicare wellness visit. Note reviewed.     03/13/2022   10:08 AM 04/17/2018    2:21 PM 03/20/2017   11:27 AM  6CIT Screen  What Year? 0 points 0 points 0 points  What month? 0 points 0 points 0 points  What time? 0 points 0 points 0 points  Count back from 20 0 points 0 points 0 points  Months in reverse 0 points 0 points 0 points  Repeat phrase 0 points 0 points 0 points  Total Score 0 points 0 points 0 points   No results found.  Flowsheet Row Clinical Support from 03/13/2022 in [facility]  PHQ-2 Total Score 0          03/13/2022   10:06 AM 03/05/2021    9:54 AM 04/20/2019    2:48 PM 10/08/2018   11:09 AM 04/17/2018    2:20 PM  Fall Risk   Falls in the past year? 0 0 0 1 No  Comment     removing dead leaves from a fig tree and fell to the ground  Number falls in past yr: 0 0 0 0 1  Injury with Fall? 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 Also on CPAP at pressure of 10 cmH2O - upcoming appt 06/2022.   Sees neurology Dr Malvin Johns for memory difficulty on aricept 10mg  and namenda 10mg  bid. Also on plavix and  aspirin.   Preventative: Colon cancer screening - cologuard 03/2018 normal, 03/2021 normal.  Mammo 03/2022 Birad1 @ [place]  Well woman exam - last pap normal 2017 - desires to age out. No fmhx GYN cancer. DEXA scan - osteopenia managed by endo ([facility]) on reclast yearly infusion - stopped ~2021.  Lung cancer screening - not eligible  Flu shot -yearly COVID vaccine Moderna 10/2019, 11/2019, booster 06/2020, 01/2021, bivalent 06/2021 Td 2003, Tdap 2013 Prevnar13 2016, pneumovax 23 2012, 2015  Zostavax - 2013 Shingrix - 2019 x2 Advanced directive discussion - has at home. Husband and daughter are HCPOA. Will bring me copy. Thinks would be DNR.  Seat belt use discussed Sunscreen use discussed. No changing moles on skin.  Smoking - none Alcohol  - none Dentist - q6 mo Eye exam - yearly Bowel - no significant constipation, occasional bowel urgency - she takes miralax regularly Bladder - some urge incontinence, wears pad regularly. No significant stress incontinence symptoms.   Lives with husband 2014  Activity - no regular exercise, does yardwork Diet - good  water, fruits/vegetables daily     Relevant past medical, surgical, family and social history reviewed and updated as indicated. Interim medical history since our last visit reviewed. Allergies and medications reviewed and updated. Outpatient Medications Prior to Visit  Medication Sig Dispense Refill   amLODipine (NORVASC) 5 MG tablet Take 1 tablet (5 mg total) by mouth daily. 90 tablet 3   aspirin EC 81 MG EC tablet Take 1 tablet (81 mg total) by mouth daily. Swallow whole. 21 tablet 0   Calcium Carb-Cholecalciferol (CALCIUM 600+D) 600-800 MG-UNIT TABS Take by mouth daily. 2 capsule daily     clopidogrel (PLAVIX) 75 MG tablet Take 1 tablet (75 mg total) by mouth daily. 90 tablet 0   COLLAGEN PO Take by mouth.     conjugated estrogens (PREMARIN) vaginal cream Use a pea-sized amount externally and internally twice a week at night 42.5 g  1   Cyanocobalamin (B-12) 2500 MCG TABS Take 1 tablet by mouth daily. Pt taking 1/2 of a 5000 mcg tablet     donepezil (ARICEPT) 10 MG tablet Take 10 mg by mouth at bedtime.      famotidine (PEPCID) 20 MG tablet TAKE ONE TABLET BY MOUTH TWICE DAILY for heartburn/indigestion 180 tablet 0   FIBER PO Take by mouth.     fluticasone (FLONASE) 50 MCG/ACT nasal spray USE TWO SPRAYS in each nostril daily(shake liquid) 48 g 1   meclizine (ANTIVERT) 25 MG tablet Take 1 tablet (25 mg total) by mouth 3 (three) times daily as needed for dizziness. 30 tablet 0   memantine (NAMENDA) 10 MG tablet Take 10 mg by mouth 2 (two) times daily.     Multiple Vitamin (MULTIVITAMIN) tablet Take 1 tablet by mouth daily.     Multiple Vitamins-Minerals (HAIR/SKIN/NAILS/BIOTIN PO) Take 1 tablet by mouth daily.     MYRBETRIQ 25 MG TB24 tablet TAKE ONE TABLET BY MOUTH ONCE DAILY 90 tablet 1   pantoprazole (PROTONIX) 40 MG tablet Take 1 tablet (40 mg total) by mouth daily as needed (GERD symptoms). 90 tablet 3   PEDIASURE/FIBER (PEDIASURE/FIBER) LIQD Take 10 mLs by mouth.     polyethylene glycol (MIRALAX / GLYCOLAX) packet Take 17 g by mouth daily.     rosuvastatin (CRESTOR) 10 MG tablet TAKE ONE TABLET BY MOUTH EVERYDAY AT BEDTIME 90 tablet 0   WIXELA INHUB 250-50 MCG/ACT AEPB INHALE 1 PUFF BY MOUTH INTO LUNGS in THE morning AND AT bedtime 60 each 3   No facility-administered medications prior to visit.     Per HPI unless specifically indicated in ROS section below Review of Systems  Constitutional:  Negative for activity change, appetite change, chills, fatigue, fever and unexpected weight change.  HENT:  Negative for hearing loss.   Eyes:  Negative for visual disturbance.  Respiratory:  Negative for cough, chest tightness, shortness of breath and wheezing.   Cardiovascular:  Negative for chest pain, palpitations and leg swelling.  Gastrointestinal:  Negative for abdominal distention, abdominal pain, blood in stool,  constipation, diarrhea, nausea and vomiting.  Genitourinary:  Negative for difficulty urinating and hematuria.  Musculoskeletal:  Negative for arthralgias, myalgias and neck pain.  Skin:  Negative for rash.  Neurological:  Positive for dizziness. Negative for seizures, syncope and headaches.  Hematological:  Negative for adenopathy. Does not bruise/bleed easily.  Psychiatric/Behavioral:  Negative for dysphoric mood. The patient is not nervous/anxious.     Objective:  BP 134/72   Pulse 63   Temp 98 F (36.7 C) (Temporal)  Ht 5' 1.5" (1.562 m)   Wt 145 lb 6 oz (65.9 kg)   SpO2 94%   BMI 27.02 kg/m   Wt Readings from Last 3 Encounters:  04/17/22 145 lb 6 oz (65.9 kg)  03/13/22 145 lb (65.8 kg)  01/07/22 145 lb 4 oz (65.9 kg)      Physical Exam Vitals and nursing note reviewed.  Constitutional:      Appearance: Normal appearance. She is not ill-appearing.  HENT:     Head: Normocephalic and atraumatic.     Right Ear: Tympanic membrane, ear canal and external ear normal. There is no impacted cerumen.     Left Ear: Tympanic membrane, ear canal and external ear normal. There is no impacted cerumen.     Ears:     Comments: Hearing aides in place Eyes:     General:        Right eye: No discharge.        Left eye: No discharge.     Extraocular Movements: Extraocular movements intact.     Conjunctiva/sclera: Conjunctivae normal.     Pupils: Pupils are equal, round, and reactive to light.  Neck:     Thyroid: No thyroid mass or thyromegaly.     Vascular: No carotid bruit.  Cardiovascular:     Rate and Rhythm: Normal rate and regular rhythm.     Pulses: Normal pulses.     Heart sounds: Normal heart sounds. No murmur heard. Pulmonary:     Effort: Pulmonary effort is normal. No respiratory distress.     Breath sounds: Normal breath sounds. No wheezing, rhonchi or rales.  Abdominal:     General: Bowel sounds are normal. There is no distension.     Palpations: Abdomen is soft.  There is no mass.     Tenderness: There is no abdominal tenderness. There is no guarding or rebound.     Hernia: No hernia is present.  Musculoskeletal:     Cervical back: Normal range of motion and neck supple. No rigidity.     Right lower leg: No edema.     Left lower leg: No edema.  Lymphadenopathy:     Cervical: No cervical adenopathy.  Skin:    General: Skin is warm and dry.     Findings: No rash.  Neurological:     General: No focal deficit present.     Mental Status: She is alert. Mental status is at baseline.  Psychiatric:        Mood and Affect: Mood normal.        Behavior: Behavior normal.       Results for orders placed or performed in visit on 04/08/22  VITAMIN D 25 Hydroxy (Vit-D Deficiency, Fractures)  Result Value Ref Range   VITD 51.72 30.00 - 100.00 ng/mL  Lipid panel  Result Value Ref Range   Cholesterol 170 0 - 200 mg/dL   Triglycerides 85.2 0.0 - 149.0 mg/dL   HDL 77.82 >42.35 mg/dL   VLDL 36.1 0.0 - 44.3 mg/dL   LDL Cholesterol 72 0 - 99 mg/dL   Total CHOL/HDL Ratio 2    NonHDL 84.69   Comprehensive metabolic panel  Result Value Ref Range   Sodium 142 135 - 145 mEq/L   Potassium 3.9 3.5 - 5.1 mEq/L   Chloride 104 96 - 112 mEq/L   CO2 29 19 - 32 mEq/L   Glucose, Bld 96 70 - 99 mg/dL   BUN 17 6 - 23 mg/dL   Creatinine,  Ser 0.97 0.40 - 1.20 mg/dL   Total Bilirubin 0.8 0.2 - 1.2 mg/dL   Alkaline Phosphatase 67 39 - 117 U/L   AST 31 0 - 37 U/L   ALT 18 0 - 35 U/L   Total Protein 7.3 6.0 - 8.3 g/dL   Albumin 4.4 3.5 - 5.2 g/dL   GFR 18.84 (L) >16.60 mL/min   Calcium 9.8 8.4 - 10.5 mg/dL    Assessment & Plan:   Problem List Items Addressed This Visit   None    No orders of the defined types were placed in this encounter.  No orders of the defined types were placed in this encounter.    Patient instructions: Bring [place] a copy of your living will.  Back off miralax some, let us know if ongoing bowel difficulty.  Try to get [place] data on CPAP  machine.  Good to see you today  Return as needed or in 1 year for next physical.   Follow up plan: Return in about 1 year (around 04/18/2023) for annual exam, prior fasting for blood work, medicare wellness visit.  [name], MD

## 2022-04-17 NOTE — Patient Instructions (Addendum)
Bring Kathy Baldwin a copy of your living will.  Back off miralax some, let Kathy Baldwin know if ongoing bowel difficulty.  Try to get Kathy Baldwin data on CPAP machine.  Good to see you today  Return as needed or in 1 year for next physical.   Health Maintenance After Age 78 After age 18, you are at a higher risk for certain long-term diseases and infections as well as injuries from falls. Falls are a major cause of broken bones and head injuries in people who are older than age 86. Getting regular preventive care can help to keep you healthy and well. Preventive care includes getting regular testing and making lifestyle changes as recommended by your health care provider. Talk with your health care provider about: Which screenings and tests you should have. A screening is a test that checks for a disease when you have no symptoms. A diet and exercise plan that is right for you. What should I know about screenings and tests to prevent falls? Screening and testing are the best ways to find a health problem early. Early diagnosis and treatment give you the best chance of managing medical conditions that are common after age 64. Certain conditions and lifestyle choices may make you more likely to have a fall. Your health care provider may recommend: Regular vision checks. Poor vision and conditions such as cataracts can make you more likely to have a fall. If you wear glasses, make sure to get your prescription updated if your vision changes. Medicine review. Work with your health care provider to regularly review all of the medicines you are taking, including over-the-counter medicines. Ask your health care provider about any side effects that may make you more likely to have a fall. Tell your health care provider if any medicines that you take make you feel dizzy or sleepy. Strength and balance checks. Your health care provider may recommend certain tests to check your strength and balance while standing, walking, or changing  positions. Foot health exam. Foot pain and numbness, as well as not wearing proper footwear, can make you more likely to have a fall. Screenings, including: Osteoporosis screening. Osteoporosis is a condition that causes the bones to get weaker and break more easily. Blood pressure screening. Blood pressure changes and medicines to control blood pressure can make you feel dizzy. Depression screening. You may be more likely to have a fall if you have a fear of falling, feel depressed, or feel unable to do activities that you used to do. Alcohol use screening. Using too much alcohol can affect your balance and may make you more likely to have a fall. Follow these instructions at home: Lifestyle Do not drink alcohol if: Your health care provider tells you not to drink. If you drink alcohol: Limit how much you have to: 0-1 drink a day for women. 0-2 drinks a day for men. Know how much alcohol is in your drink. In the [place] one drink equals one 12 oz bottle of beer (355 mL), one 5 oz glass of wine (148 mL), or one 1 oz glass of hard liquor (44 mL). Do not use any products that contain nicotine or tobacco. These products include cigarettes, chewing tobacco, and vaping devices, such as e-cigarettes. If you need help quitting, ask your health care provider. Activity  Follow a regular exercise program to stay fit. This will help you maintain your balance. Ask your health care provider what types of exercise are appropriate for you. If you need a cane  or walker, use it as recommended by your health care provider. Wear supportive shoes that have nonskid soles. Safety  Remove any tripping hazards, such as rugs, cords, and clutter. Install safety equipment such as grab bars in bathrooms and safety rails on stairs. Keep rooms and walkways well-lit. General instructions Talk with your health care provider about your risks for falling. Tell your health care provider if: You fall. Be sure to tell your  health care provider about all falls, even ones that seem minor. You feel dizzy, tiredness (fatigue), or off-balance. Take over-the-counter and prescription medicines only as told by your health care provider. These include supplements. Eat a healthy diet and maintain a healthy weight. A healthy diet includes low-fat dairy products, low-fat (lean) meats, and fiber from whole grains, beans, and lots of fruits and vegetables. Stay current with your vaccines. Schedule regular health, dental, and eye exams. Summary Having a healthy lifestyle and getting preventive care can help to protect your health and wellness after age 71. Screening and testing are the best way to find a health problem early and help you avoid having a fall. Early diagnosis and treatment give you the best chance for managing medical conditions that are more common for people who are older than age 58. Falls are a major cause of broken bones and head injuries in people who are older than age 90. Take precautions to prevent a fall at home. Work with your health care provider to learn what changes you can make to improve your health and wellness and to prevent falls. This information is not intended to replace advice given to you by your health care provider. Make sure you discuss any questions you have with your health care provider. Document Revised: 01/15/2021 Document Reviewed: 01/15/2021 Elsevier Patient Education  2023 [employer].

## 2022-04-18 ENCOUNTER — Ambulatory Visit (INDEPENDENT_AMBULATORY_CARE_PROVIDER_SITE_OTHER): Payer: PPO | Admitting: Family Medicine

## 2022-04-18 ENCOUNTER — Encounter: Payer: Self-pay | Admitting: Family Medicine

## 2022-04-18 VITALS — BP 130/60 | HR 71 | Temp 98.1°F | Ht 63.0 in | Wt 146.5 lb

## 2022-04-18 DIAGNOSIS — M7062 Trochanteric bursitis, left hip: Secondary | ICD-10-CM | POA: Diagnosis not present

## 2022-04-18 DIAGNOSIS — R195 Other fecal abnormalities: Secondary | ICD-10-CM | POA: Insufficient documentation

## 2022-04-18 MED ORDER — POLYETHYLENE GLYCOL 3350 17 G PO PACK: 8.5000 g | PACK | Freq: Every day | ORAL | Status: DC | PRN

## 2022-04-18 MED ORDER — TRIAMCINOLONE ACETONIDE 40 MG/ML IJ SUSP
40.0000 mg | Freq: Once | INTRAMUSCULAR | Status: AC
  Administered 2022-04-18: 40 mg via INTRA_ARTICULAR

## 2022-04-18 NOTE — Patient Instructions (Signed)
Hip Rehab:  Hip Flexion: Toe up to ceiling, laying on your back. Lift your whole leg, 3 sets. Work up to being able to do #30 with each set.  Hip Abductions: Lying on side, straight out to side. 3 sets, work up to being able to do #30 with each set.  At the beginning you may only be able to do a lot less, try to do #10.

## 2022-04-18 NOTE — Assessment & Plan Note (Signed)
Confirmed with patient.

## 2022-04-18 NOTE — Assessment & Plan Note (Addendum)
Continues pantoprazole daily with pepcid bid. Breakthrough symptoms if dose missed.

## 2022-04-18 NOTE — Assessment & Plan Note (Signed)
Saw pulm - treating as asthma with Wixela with benefit. Has f/u pulm planned next month.

## 2022-04-18 NOTE — Assessment & Plan Note (Signed)
Chronic, mild, stable.  

## 2022-04-18 NOTE — Progress Notes (Signed)
    [name], MD, CAQ Sports Medicine [facility] at [facility] [address] [place], [address]  Phone: [phone]  FAX: [phone]  ERA PARR - 76 y.o. female  MRN [id number]  Date of Birth: 20-Dec-1945  Date: 04/18/2022  PCP: [name], MD  Referral: [name], MD  Chief Complaint  Patient presents with   Hip Pain    Right   Subjective:   Kathy Baldwin is a 76 y.o. very pleasant female patient with Body mass index is 25.95 kg/m. who presents with the following:  L GTB: hip pain:  Procedure only.  Aspiration/Injection Procedure Note Kathy Baldwin August 21, 1946 Date of procedure: 04/18/2022  Procedure: Large Joint Aspiration / Injection of Hip, Trochanteric Bursa, L Indications: Pain  Procedure Details Verbal consent obtained. Risks, benefits, and alternatives reviewed. Greater trochanter sterilely prepped with Chloraprep. Ethyl Chloride used for anesthesia. 9 cc of Lidocaine 1% injected with 1 mL of Kenalog 40 mg into trochanteric bursa at area of maximal tenderness at greater trochanter. Needle taken to bone to troch bursa, flows easily. Bursa massaged. No bleeding and no complications. Decreased pain after injection. Needle: 22 gauge spinal needle Medication: 1 mL of Kenalog 40 mg

## 2022-04-18 NOTE — Assessment & Plan Note (Signed)
Chronic, on crestor 10mg  daily. Continue. The ASCVD Risk score (Arnett DK, et al., 2019) failed to calculate for the following reasons:   The patient has a prior MI or stroke diagnosis

## 2022-04-18 NOTE — Assessment & Plan Note (Addendum)
Discussed miralax use - anticipate overuse may have contributed.

## 2022-04-18 NOTE — Assessment & Plan Note (Signed)
Advanced directive discussion - has at home, I don't have records. Husband and daughter are HCPOA. Will bring me copy. Thinks would be DNR.

## 2022-04-18 NOTE — Assessment & Plan Note (Signed)
Continued difficulty, followed by neurology Dr Malvin Johns on aricept and namenda.

## 2022-04-18 NOTE — Assessment & Plan Note (Signed)
Chronic, stable on current regimen of amlodipine.

## 2022-04-18 NOTE — Assessment & Plan Note (Addendum)
Continue aspirin ,plavix, statin.  

## 2022-04-18 NOTE — Assessment & Plan Note (Signed)
Regularly wears hearing aides. 

## 2022-04-18 NOTE — Assessment & Plan Note (Signed)
Appreciate neuro care.  

## 2022-04-18 NOTE — Assessment & Plan Note (Addendum)
Osteopenia managed by endo Dr Tedd Sias  Previously on reclast.

## 2022-04-18 NOTE — Assessment & Plan Note (Signed)
Continues aspirin, plavix, statin.  

## 2022-04-18 NOTE — Assessment & Plan Note (Addendum)
Continue CPAP use, thinks pressure of 10cm H2O. No recent data to review - I asked them to send me latest CPAP data.

## 2022-04-18 NOTE — Assessment & Plan Note (Signed)
Preventative protocols reviewed and updated unless pt declined. Discussed healthy diet and lifestyle.  

## 2022-05-06 ENCOUNTER — Other Ambulatory Visit: Payer: Self-pay | Admitting: Family Medicine

## 2022-05-07 NOTE — Telephone Encounter (Signed)
Refill request Wixela Last office visit 04/18/22 acute [place] Last visit PCP 04/17/22 Last refill 02/05/22 #60/3

## 2022-05-20 ENCOUNTER — Other Ambulatory Visit: Payer: Self-pay | Admitting: Family Medicine

## 2022-05-20 NOTE — Telephone Encounter (Signed)
Refill request Premarin cream Last refill 10/19/20  42.5 G/1 refill Last office visit 04/18/22 acute Dr. Patsy Lager

## 2022-05-30 ENCOUNTER — Telehealth: Payer: Self-pay

## 2022-05-30 NOTE — Chronic Care Management (AMB) (Signed)
    Chronic Care Management Pharmacy Assistant   Name: Kathy Baldwin  MRN: [id number] DOB: [birth year]  Reason for Encounter: Reminder Call   Medications: Outpatient Encounter Medications as of 05/30/2022  Medication Sig   amLODipine (NORVASC) 5 MG tablet Take 1 tablet (5 mg total) by mouth daily.   aspirin EC 81 MG EC tablet Take 1 tablet (81 mg total) by mouth daily. Swallow whole.   Calcium Carb-Cholecalciferol (CALCIUM 600+D) 600-800 MG-UNIT TABS Take by mouth daily. 2 capsule daily   clopidogrel (PLAVIX) 75 MG tablet Take 1 tablet (75 mg total) by mouth daily.   COLLAGEN PO Take by mouth.   Cyanocobalamin (B-12) 2500 MCG TABS Take 1 tablet by mouth daily. Pt taking 1/2 of a 5000 mcg tablet   donepezil (ARICEPT) 10 MG tablet Take 10 mg by mouth at bedtime.    famotidine (PEPCID) 20 MG tablet TAKE ONE TABLET BY MOUTH TWICE DAILY for heartburn/indigestion   FIBER PO Take by mouth.   fluticasone (FLONASE) 50 MCG/ACT nasal spray USE TWO SPRAYS in each nostril daily(shake liquid)   meclizine (ANTIVERT) 25 MG tablet Take 1 tablet (25 mg total) by mouth 3 (three) times daily as needed for dizziness.   memantine (NAMENDA) 10 MG tablet Take 10 mg by mouth 2 (two) times daily.   Multiple Vitamin (MULTIVITAMIN) tablet Take 1 tablet by mouth daily.   Multiple Vitamins-Minerals (HAIR/SKIN/NAILS/BIOTIN PO) Take 1 tablet by mouth daily.   MYRBETRIQ 25 MG TB24 tablet TAKE ONE TABLET BY MOUTH ONCE DAILY   pantoprazole (PROTONIX) 40 MG tablet Take 1 tablet (40 mg total) by mouth daily as needed (GERD symptoms).   PEDIASURE/FIBER (PEDIASURE/FIBER) LIQD Take 10 mLs by mouth.   polyethylene glycol (MIRALAX / GLYCOLAX) 17 g packet Take 8.5-17 g by mouth daily as needed.   PREMARIN vaginal cream USE A pea-sized AMOUNT externally AND internally twice A WEEK AT night   rosuvastatin (CRESTOR) 10 MG tablet TAKE ONE TABLET BY MOUTH EVERYDAY AT BEDTIME   WIXELA INHUB 250-50 MCG/ACT AEPB INHALE 1 PUFF BY  MOUTH INTO LUNGS in THE morning AND AT bedtime   No facility-administered encounter medications on file as of 05/30/2022.    [name] was contacted to remind of upcoming telephone visit with Charlene Brooke on 06/04/22 at 11:00. Patient was reminded to have any blood glucose and blood pressure readings available for review at appointment.   Patient confirmed appointment.  Are you having any problems with your medications? No The patient has started back on amlodipine per Dr.G  Do you have any concerns you like to discuss with the pharmacist? No  CCM referral has been placed prior to visit?  Yes   Star Rating Drugs: Medication:  Last Fill: Day Supply Rosuvastatin 10mg  03/14/22  [place], CPP notified  Avel Sensor, [place]  [phone]

## 2022-06-04 ENCOUNTER — Ambulatory Visit (INDEPENDENT_AMBULATORY_CARE_PROVIDER_SITE_OTHER): Payer: PPO | Admitting: Pharmacist

## 2022-06-04 DIAGNOSIS — E782 Mixed hyperlipidemia: Secondary | ICD-10-CM

## 2022-06-04 DIAGNOSIS — I10 Essential (primary) hypertension: Secondary | ICD-10-CM

## 2022-06-04 DIAGNOSIS — Z8673 Personal history of transient ischemic attack (TIA), and cerebral infarction without residual deficits: Secondary | ICD-10-CM

## 2022-06-04 DIAGNOSIS — I700 Atherosclerosis of aorta: Secondary | ICD-10-CM

## 2022-06-04 NOTE — Patient Instructions (Addendum)
Visit Information  Phone number for Pharmacist: [phone]   Goals Addressed   None     Care Plan : [place]  Updates made by Charlton Haws, RPH since 06/04/2022 12:00 AM     Problem: Hypertension, Hyperlipidemia, Coronary Artery Disease, and GERD, hx Stroke   Priority: High     Long-Range Goal: Disease mgmt   Start Date: 12/06/2021  Expected End Date: 06/04/2022  This Visit's Progress: On track  Recent Progress: On track  Priority: High  Note:   Current Barriers:  None identified  Pharmacist Clinical Goal(s):  Patient will contact provider office for questions/concerns as evidenced notation of same in electronic health record through collaboration with PharmD and provider.   Interventions: 1:1 collaboration with Ria Bush, MD regarding development and update of comprehensive plan of care as evidenced by provider attestation and co-signature Inter-disciplinary care team collaboration (see longitudinal plan of care) Comprehensive medication review performed; medication list updated in electronic medical record  Hypertension (BP goal <140/90) -Controlled - per OV and pt report -Home BP: pt denies BP > 140/90 -Current treatment: Amlodipine 5 mg daily - Appropriate, Effective, Safe, Accessible -Medications previously tried: n/a  -Educated on BP goals and benefits of medications for prevention of heart attack, stroke and kidney damage; -Recommend to continue current medication  Hyperlipidemia / CVA (LDL goal < 70) -Controlled - LDL 72 (03/2022) reasonable; pt started DAPT Jan 2023 after stroke, original plan was for 3 weeks of DAPT followed by Plavix monotherapy, however pt saw cardiology Feb 2023 and advised to continue aspirin and Plavix indefinitely with hx of aortic atherosclerosis, PVD. -Hx CVA 09/2021; hx PVD; hx aortic atherosclerosis -Current treatment: Rosuvastatin 10 mg daily HS - Appropriate, Effective, Safe, Accessible Clopidogrel 75  mg daily -Appropriate, Effective, Safe, Accessible Aspirin 81 mg  - Appropriate, Effective, Safe, Accessible -Medications previously tried: n/a  -Educated on Cholesterol goals; Benefits of statin for ASCVD risk reduction; -Recommended to continue current medication  GERD (Goal: manage symptoms) -Controlled - pt reports pantoprazole is not quite as effective as omeprazole (PPI was switched after Plavix initiation due to DDI) -Current treatment  Pantoprazole 40 mg daily - Appropriate, Effective, Safe, Accessible Famotidine 20 mg daily - Appropriate, Effective, Safe, Accessible -Medications previously tried: omeprazole  -Advised to use Famotidine up to BID for breakthrough symptoms -Recommended to continue current medication  Asthma (Goal: manage symptoms) -Controlled - pt reports doing much better with SOB since starting Wixela -Spirometry 12/07/21 - not acceptable or reproducible, consider obstructive disease -Hx OSA on CPAP -Follows with pulmonary (Dr Patsey Berthold) -Current treatment  Wixela 1 puff BID - Appropriate, Effective, Safe, Accessible -Medications previously tried: n/a  -Recommended to continue current medication  Patient Goals/Self-Care Activities Patient will:  - take medications as prescribed as evidenced by patient report and record review focus on medication adherence by routine check blood pressure periodically, document, and provide at future appointments       Patient verbalizes understanding of instructions and care plan provided today and agrees to view in [place]. Active MyChart status and patient understanding of how to access instructions and care plan via MyChart confirmed with patient.     The patient has been provided with contact information for the care management team and has been advised to call with any health related questions or concerns.   Charlene Brooke, PharmD, BCACP Clinical Pharmacist [facility] at [facility] [place] [phone]

## 2022-06-04 NOTE — Progress Notes (Signed)
Chronic Care Management Pharmacy Note  06/04/2022 Name:  Kathy Baldwin MRN:  [id number] DOB:  01/01/1946  Summary: CCM F/U visit -Reviewed medications, pt affirms compliance as prescribed -Pt reports not checking BP often, but has not seen readings > 140/90 lately  Recommendations/Changes made from today's visit: -No med changes  Plan: -Transition CCM to Self Care: Patient achieved CCM goals and no longer needs to be contacted as frequently. The patient has been provided with contact information for the care management team and has been advised to call with any health related questions or concerns.    Subjective: Kathy Baldwin is an 76 y.o. year old female who is a primary patient of Ria Bush, MD.  The CCM team was consulted for assistance with disease management and care coordination needs.    Engaged with patient by telephone for follow up visit in response to provider referral for pharmacy case management and/or care coordination services.   Consent to Services:  The patient was given information about Chronic Care Management services, agreed to services, and gave verbal consent prior to initiation of services.  Please see initial visit note for detailed documentation.   Patient Care Team: Ria Bush, MD as PCP - General (Family Medicine) Lucilla Lame, MD as Consulting Physician (Gastroenterology) Pa, [place] (Optometry) Solum, Betsey Holiday, MD as Physician Assistant (Endocrinology) Beverly Gust, MD as Consulting Physician (Otolaryngology) Leandrew Koyanagi, MD as Referring Physician (Ophthalmology) Charlton Haws, [facility] as Pharmacist (Pharmacist)  Recent office visits: 04/18/22 Dr Lorelei Pont OV: bursitis - steroid injection 04/17/22 Dr Danise Mina OV: annual - back off Miralax use. Request CPAP data. No med changes.  01/07/22 Dr Lorelei Pont OV: acute leg pain - improving.  11/06/21 Dr Danise Mina OV: fatigue, DOE. Order CT to r/o PE. CT negative  for PE but showed possible COPD. Rx'd Wixela 1 puff BID.  10/16/21 PCP phone note - increase amlodipine to 5 mg. If BP drops, reduce back to 2.5 mg   09/26/21 Dr Danise Mina OV: hospital f/u (CVA); restart amlodipine 2.5. DAPT x 3 wks then continue daily plavix only.  09/17/21 pt message: omeprazole switched to pantoprazole d/t DDI with Plavix.  Recent consult visits: 05/02/22 Dr Nehemiah Massed (Cardiology): f/u, no changes  04/23/22 Dr Melrose Nakayama (Neurology): f/u memory loss.  MMSE 26/30. Stop famotidine and pantoprazole. Continue omeprazole. Refer to [facility] neurology for Leqembi infusions.   04/11/22 Dr Gabriel Carina (Endocrine): osteopenia - s/p Reclast x 4 doses. Favor not restarting treatment given stable T-scores. Repeat DEXA 3 years. 12/20/21 Dr Patsey Berthold (Pulmonary): f/u asthma. No changes. 11/19/21 Dr Patsey Berthold (Pulmonary): consult SOB. Continue Wixela. Order PFT.  11/02/21 PA Jettie Booze (Cardiology): continue aspirin, plavix for hx PVD, AA, carotid stenosis.  10/24/21 Dr Melrose Nakayama (neurology): f/u memory loss. Refill donepezil, Namenda.  Hospital visits: 09/11/21-09/13/21 admission [facility]): Stroke. Started aspirin and plavix.  Objective:  Lab Results  Component Value Date   CREATININE 0.97 04/08/2022   BUN 17 04/08/2022   GFR 56.31 (L) 04/08/2022   GFRNONAA >60 09/13/2021   GFRAA 87 10/08/2018   NA 142 04/08/2022   K 3.9 04/08/2022   CALCIUM 9.8 04/08/2022   CO2 29 04/08/2022   GLUCOSE 96 04/08/2022    Lab Results  Component Value Date/Time   HGBA1C 5.6 09/11/2021 07:20 PM   GFR 56.31 (L) 04/08/2022 08:59 AM   GFR 63.48 11/06/2021 10:15 AM   MICROALBUR <0.7 02/26/2021 07:59 AM    Last diabetic Eye exam: No results found for: "HMDIABEYEEXA"  Last diabetic Foot exam:  No results found for: "HMDIABFOOTEX"   Lab Results  Component Value Date   CHOL 170 04/08/2022   HDL 84.90 04/08/2022   LDLCALC 72 04/08/2022   TRIG 65.0 04/08/2022   CHOLHDL 2 04/08/2022       Latest Ref Rng & Units 04/08/2022     8:59 AM 09/11/2021    8:57 AM 02/26/2021    7:59 AM  Hepatic Function  Total Protein 6.0 - 8.3 g/dL 7.3  7.5  6.9   Albumin 3.5 - 5.2 g/dL 4.4  4.3  4.5   AST 0 - 37 U/L 31  36  32   ALT 0 - 35 U/L '18  20  21   ' Alk Phosphatase 39 - 117 U/L 67  80  69   Total Bilirubin 0.2 - 1.2 mg/dL 0.8  1.0  0.9     Lab Results  Component Value Date/Time   TSH 0.51 11/06/2021 10:15 AM   TSH 0.33 (L) 09/26/2021 10:27 AM   FREET4 0.64 11/06/2021 10:15 AM   FREET4 0.72 09/26/2021 03:20 PM       Latest Ref Rng & Units 11/06/2021   10:15 AM 09/13/2021    6:17 AM 09/12/2021    5:26 AM  CBC  WBC 4.0 - 10.5 K/uL 5.3  7.8  6.1   Hemoglobin 12.0 - 15.0 g/dL 12.8  13.2  13.2   Hematocrit 36.0 - 46.0 % 39.2  39.8  40.6   Platelets 150.0 - 400.0 K/uL 210.0  221  216     Lab Results  Component Value Date/Time   VD25OH 51.72 04/08/2022 08:59 AM   VD25OH 61.54 09/26/2021 10:27 AM    Clinical ASCVD: Yes  The ASCVD Risk score (Arnett DK, et al., 2019) failed to calculate for the following reasons:   The patient has a prior MI or stroke diagnosis       03/13/2022   10:03 AM 03/05/2021    9:55 AM 08/17/2019   12:13 PM  Depression screen PHQ 2/9  Decreased Interest 0 0 0  Down, Depressed, Hopeless 0 0 0  PHQ - 2 Score 0 0 0  Altered sleeping  0 0  Tired, decreased energy  0 1  Change in appetite  0 0  Feeling bad or failure about yourself   0 0  Trouble concentrating  0 0  Moving slowly or fidgety/restless  0 0  Suicidal thoughts  0 0  PHQ-9 Score  0 1      Social History   Tobacco Use  Smoking Status Never  Smokeless Tobacco Never  Tobacco Comments   Never smoked, so cannot quit...   BP Readings from Last 3 Encounters:  04/18/22 130/60  04/17/22 134/72  01/07/22 110/72   Pulse Readings from Last 3 Encounters:  04/18/22 71  04/17/22 63  01/07/22 62   Wt Readings from Last 3 Encounters:  04/18/22 146 lb 8 oz (66.5 kg)  04/17/22 145 lb 6 oz (65.9 kg)  03/13/22 145 lb (65.8 kg)    BMI Readings from Last 3 Encounters:  04/18/22 25.95 kg/m  04/17/22 27.02 kg/m  03/13/22 25.69 kg/m    Assessment/Interventions: Review of patient past medical history, allergies, medications, health status, including review of consultants reports, laboratory and other test data, was performed as part of comprehensive evaluation and provision of chronic care management services.   SDOH:  (Social Determinants of Health) assessments and interventions performed: No SDOH Interventions    Flowsheet Row Clinical Support from 03/13/2022  in [employer] at [place] Interventions   Food Insecurity Interventions Intervention Not Indicated  Housing Interventions Intervention Not Indicated  Transportation Interventions Intervention Not Indicated  Financial Strain Interventions Intervention Not Indicated  Physical Activity Interventions Intervention Not Indicated  Stress Interventions Intervention Not Indicated  Social Connections Interventions Intervention Not Indicated      SDOH Screenings   Food Insecurity: No Food Insecurity (03/13/2022)  Housing: Low Risk  (03/13/2022)  Transportation Needs: No Transportation Needs (03/13/2022)  Alcohol Screen: Low Risk  (03/13/2022)  Depression (PHQ2-9): Low Risk  (03/13/2022)  Financial Resource Strain: Low Risk  (03/13/2022)  Physical Activity: Inactive (03/13/2022)  Social Connections: Moderately Isolated (03/13/2022)  Stress: No Stress Concern Present (03/13/2022)  Tobacco Use: Low Risk  (04/18/2022)    CCM Care Plan  Allergies  Allergen Reactions   Cabbage Other (See Comments)    Hypersensitivity on allergy testing   Chocolate Other (See Comments)    Hypersensitivity on allergy testing   Statins Other (See Comments)    Zocor and Pravastatin--leg cramps Other reaction(s): Other (See Comments) Zocor and Pravastatin--leg cramps    Medications Reviewed Today     Reviewed by Charlton Haws, [facility] (Pharmacist) on 06/04/22 at 1115   Med List Status: <None>   Medication Order Taking? Sig Documenting Provider Last Dose Status Informant  amLODipine (NORVASC) 5 MG tablet [id number] Yes Take 1 tablet (5 mg total) by mouth daily. Ria Bush, MD Taking Active   aspirin EC 81 MG EC tablet [id number] Yes Take 1 tablet (81 mg total) by mouth daily. Swallow whole. Loletha Grayer, MD Taking Active   Calcium Carb-Cholecalciferol (CALCIUM 600+D) 600-800 MG-UNIT TABS [id number] Yes Take by mouth daily. 2 capsule daily [provider] Taking Active Self  clopidogrel (PLAVIX) 75 MG tablet [id number] Yes Take 1 tablet (75 mg total) by mouth daily. Loletha Grayer, MD Taking Active   COLLAGEN PO [id number] Yes Take by mouth. [provider] Taking Active Self  Cyanocobalamin (B-12) 2500 MCG TABS [id number] Yes Take 1 tablet by mouth daily. Pt taking 1/2 of a 5000 mcg tablet [provider] Taking Active Self  donepezil (ARICEPT) 10 MG tablet [id number] Yes Take 10 mg by mouth at bedtime.  [provider] Taking Active Self  famotidine (PEPCID) 20 MG tablet [id number] Yes TAKE ONE TABLET BY MOUTH TWICE DAILY for heartburn/indigestion Ria Bush, MD Taking Active   FIBER PO [id number] Yes Take by mouth. [provider] Taking Active Self  fluticasone (FLONASE) 50 MCG/ACT nasal spray [id number] Yes USE TWO SPRAYS in each nostril daily(shake liquid) Ria Bush, MD Taking Active   meclizine (ANTIVERT) 25 MG tablet [id number] Yes Take 1 tablet (25 mg total) by mouth 3 (three) times daily as needed for dizziness. Ria Bush, MD Taking Active   memantine [facility]) 10 MG tablet [id number] Yes Take 10 mg by mouth 2 (two) times daily. [provider] Taking Active Self  Multiple Vitamin (MULTIVITAMIN) tablet [id number] Yes Take 1 tablet by mouth daily. [provider] Taking Active Self  Multiple Vitamins-Minerals (HAIR/SKIN/NAILS/BIOTIN PO) [id number] Yes Take 1 tablet by mouth  daily. [provider] Taking Active Self  MYRBETRIQ 25 MG TB24 tablet [id number] Yes TAKE ONE TABLET BY MOUTH ONCE DAILY Ria Bush, MD Taking Active   pantoprazole (PROTONIX) 40 MG tablet [id number] Yes Take 1 tablet (40 mg total) by mouth daily as needed (GERD symptoms). Ria Bush, MD Taking Active   PEDIASURE/FIBER (PEDIASURE/FIBER) LIQD [id number] Yes Take 10 mLs by mouth. [provider] Taking Active Self  polyethylene glycol (MIRALAX / GLYCOLAX) 17 g packet [id number] Yes Take 8.5-17 g by mouth daily as needed. Ria Bush, MD Taking Active   PREMARIN vaginal cream [id number] Yes USE A pea-sized AMOUNT externally AND internally twice A WEEK AT night Ria Bush, MD Taking Active   rosuvastatin (CRESTOR) 10 MG tablet [id number] Yes TAKE ONE TABLET BY MOUTH EVERYDAY AT BEDTIME Ria Bush, MD Taking Active   [facility] INHUB 250-50 MCG/ACT AEPB [id number] Yes INHALE 1 PUFF BY MOUTH INTO LUNGS in THE morning AND AT bedtime Ria Bush, MD Taking Active             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   Small vessel disease, 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   Carotid stenosis, bilateral 10/08/2018   Thyroid nodule 04/14/2018   DNR no code (do not resuscitate) 03/20/2017   Vaginal atrophy 02/18/2017   Aortic atherosclerosis ([place]) 09/05/2016   Decreased hearing, bilateral 12/04/2015   Health maintenance examination 12/04/2015    Hypertension    ASCUS with positive high risk human papillomavirus of vagina    GERD (gastroesophageal reflux disease)    OSA on CPAP    Hypertrophy of nasal turbinates    Hyperlipidemia 03/28/2015   Allergic rhinitis 03/28/2015   History of decompression of median nerve 02/14/2014   Osteopenia 06/09/2013    Immunization History  Administered Date(s) Administered   Fluad Quad(high Dose 65+) 05/12/2020, 06/08/2021   Influenza, High Dose Seasonal PF 06/27/2017, 05/21/2018   Influenza, Quadrivalent, Recombinant, Inj, Pf 07/05/2019   Influenza, Seasonal, Injecte, Preservative Fre 05/25/2015   Influenza-Unspecified 06/12/2011, 05/10/2014, 06/22/2016, 06/27/2017   Moderna Covid-19 Vaccine Bivalent Booster 12yr & up 06/14/2021   Moderna SARS-COV2 Booster Vaccination 01/26/2021   Moderna Sars-Covid-2 Vaccination 10/22/2019, 11/19/2019, 07/03/2020   Pneumococcal Conjugate-13 11/30/2014   Pneumococcal Polysaccharide-23 08/13/2011, 10/01/2013   Td 06/09/2002   Tdap 09/10/2011, 08/13/2012   Zoster Recombinat (Shingrix) 04/27/2018, 06/29/2018   Zoster, Live 08/13/2012    Conditions to be addressed/monitored:  Hypertension, Hyperlipidemia, Coronary Artery Disease, and GERD, hx Stroke  Care Plan : [place] Updates made by FCharlton Haws [place]Round [place] 06/04/2022 12:00 AM     Problem: Hypertension, Hyperlipidemia, Coronary Artery Disease, and GERD, hx Stroke   Priority: High     Long-Range Goal: Disease mgmt   Start Date: 12/06/2021  Expected End Date: 06/04/2022  This Visit's Progress: On track  Recent Progress: On track  Priority: High  Note:   Current Barriers:  None identified  Pharmacist Clinical Goal(s):  Patient will contact provider office for questions/concerns as evidenced notation of same in electronic health record through collaboration with PharmD and provider.   Interventions: 1:1 collaboration with GRia Bush MD regarding development and  update of comprehensive plan of care as evidenced by provider attestation and co-signature Inter-disciplinary care team collaboration (see longitudinal plan of care) Comprehensive medication review performed; medication list updated in electronic medical record  Hypertension (BP goal <140/90) -Controlled - per OV and pt report -  Home BP: pt denies BP > 140/90 -Current treatment: Amlodipine 5 mg daily - Appropriate, Effective, Safe, Accessible -Medications previously tried: n/a  -Educated on BP goals and benefits of medications for prevention of heart attack, stroke and kidney damage; -Recommend to continue current medication  Hyperlipidemia / CVA (LDL goal < 70) -Controlled - LDL 72 (03/2022) reasonable; pt started DAPT Jan 2023 after stroke, original plan was for 3 weeks of DAPT followed by Plavix monotherapy, however pt saw cardiology Feb 2023 and advised to continue aspirin and Plavix indefinitely with hx of aortic atherosclerosis, PVD. -Hx CVA 09/2021; hx PVD; hx aortic atherosclerosis -Current treatment: Rosuvastatin 10 mg daily HS - Appropriate, Effective, Safe, Accessible Clopidogrel 75 mg daily -Appropriate, Effective, Safe, Accessible Aspirin 81 mg  - Appropriate, Effective, Safe, Accessible -Medications previously tried: n/a  -Educated on Cholesterol goals; Benefits of statin for ASCVD risk reduction; -Recommended to continue current medication  GERD (Goal: manage symptoms) -Controlled - pt reports pantoprazole is not quite as effective as omeprazole (PPI was switched after Plavix initiation due to DDI) -Current treatment  Pantoprazole 40 mg daily - Appropriate, Effective, Safe, Accessible Famotidine 20 mg daily - Appropriate, Effective, Safe, Accessible -Medications previously tried: omeprazole  -Advised to use Famotidine up to BID for breakthrough symptoms -Recommended to continue current medication  Asthma (Goal: manage symptoms) -Controlled - pt reports doing much better  with SOB since starting Wixela -Spirometry 12/07/21 - not acceptable or reproducible, consider obstructive disease -Hx OSA on CPAP -Follows with pulmonary (Dr Patsey Berthold) -Current treatment  Wixela 1 puff BID - Appropriate, Effective, Safe, Accessible -Medications previously tried: n/a  -Recommended to continue current medication  Patient Goals/Self-Care Activities Patient will:  - take medications as prescribed as evidenced by patient report and record review focus on medication adherence by routine check blood pressure periodically, document, and provide at future appointments         Medication Assistance: None required.  Patient affirms current coverage meets needs.  Compliance/Adherence/Medication fill history: Care Gaps: None  Star-Rating Drugs: Rosuvastatin - PDC 100%  Medication Access: Within the past 30 days, how often has patient missed a dose of medication? 0 Is a pillbox or other method used to improve adherence? No  Factors that may affect medication adherence? lack of understanding of disease management Are meds synced by current pharmacy? Yes  Are meds delivered by current pharmacy? Yes  Does patient experience delays in picking up medications due to transportation concerns? No   [facility] Reviewed: Is patient disadvantaged to use [facility]?: No  Current Rx insurance plan: HTA Name and location of Current pharmacy:  [facility] - [place], [place] - [address] [place] [address] Phone: [phone] Fax: [phone]  [facility] services reviewed with patient today?: Yes  90 Days Packs (delivery 03/21/22) Donepezil 10 mg one daily - 1 bedtime Famotidine 20 mg one daily - 1 breakfast, 1 evening meal Memantine 10 mg - 1 breakfast, 1 evening meal   Rosuvastatin 10 mg one daily - 1 bedtime Myrbetriq 25 mg -- take 1 tablet at breakfast  Clopidogrel 16m- take 1 tablet  breakfast Pantoprazole 458m take 1 tablet breakfast VIAL medications:             Fluticasone nasal spray- 2 sprays in each nostril as needed   Patient declined the following medications this month:             Amlodipine 47m34mstopped med  Wixela inhaler- 1 puff 2 times daily -patient has supply on hand              Aspirin 17m getting OTC  Care Plan and Follow Up Patient Decision:  Patient agrees to Care Plan and Follow-up.  Plan: The patient has been provided with contact information for the care management team and has been advised to call with any health related questions or concerns.   LCharlene Brooke PharmD, BCACP Clinical Pharmacist LAudubon ParkPrimary Care at [phone]

## 2022-06-07 ENCOUNTER — Other Ambulatory Visit: Payer: Self-pay | Admitting: Family Medicine

## 2022-06-07 DIAGNOSIS — K219 Gastro-esophageal reflux disease without esophagitis: Secondary | ICD-10-CM

## 2022-06-07 DIAGNOSIS — E782 Mixed hyperlipidemia: Secondary | ICD-10-CM

## 2022-06-07 DIAGNOSIS — N3941 Urge incontinence: Secondary | ICD-10-CM

## 2022-06-08 DIAGNOSIS — I10 Essential (primary) hypertension: Secondary | ICD-10-CM | POA: Diagnosis not present

## 2022-06-08 DIAGNOSIS — J45909 Unspecified asthma, uncomplicated: Secondary | ICD-10-CM | POA: Diagnosis not present

## 2022-06-08 DIAGNOSIS — E782 Mixed hyperlipidemia: Secondary | ICD-10-CM | POA: Diagnosis not present

## 2022-06-08 DIAGNOSIS — I2510 Atherosclerotic heart disease of native coronary artery without angina pectoris: Secondary | ICD-10-CM | POA: Diagnosis not present

## 2022-06-10 ENCOUNTER — Telehealth: Payer: Self-pay

## 2022-06-10 NOTE — Chronic Care Management (AMB) (Signed)
Chronic Care Management Pharmacy Assistant   Name: Kathy Baldwin  MRN: [id number] DOB: [birth year]  Reason for Encounter: Medication Adherence and Delivery Coordination   Recent office visits:  04/18/22-Spencer Copland,MD(fam med)- left hip bursitis,Kenalog 40mg  injection. 04/17/22-Javier Gutierrez,MD(PCP)-AWV-back off miralax,discussed screenings,vaccines,d no medication changes, f/u 1 year  Recent consult visits:  05/02/22-Bruce Kowalski,MD(cardio)-f/u HTN, discussed healthy diet,no medication changes. 04/23/22-Zachary Potter,MD(neuro)-f/u memory loss,referral to [facility] leqemi infusion discussion., F/u  6 months  04/11/22-Anna Solum,MD(endo)-f/u osteopenia,discuss dexa scan, no medication changes.  Hospital visits:  None in previous 6 months  Medications: Outpatient Encounter Medications as of 06/10/2022  Medication Sig   amLODipine (NORVASC) 5 MG tablet Take 1 tablet (5 mg total) by mouth daily.   aspirin EC 81 MG EC tablet Take 1 tablet (81 mg total) by mouth daily. Swallow whole.   Calcium Carb-Cholecalciferol (CALCIUM 600+D) 600-800 MG-UNIT TABS Take by mouth daily. 2 capsule daily   clopidogrel (PLAVIX) 75 MG tablet Take 1 tablet (75 mg total) by mouth daily.   COLLAGEN PO Take by mouth.   Cyanocobalamin (B-12) 2500 MCG TABS Take 1 tablet by mouth daily. Pt taking 1/2 of a 5000 mcg tablet   donepezil (ARICEPT) 10 MG tablet Take 10 mg by mouth at bedtime.    famotidine (PEPCID) 20 MG tablet Take 1 tablet (20 mg total) by mouth 2 (two) times daily. For heartburn/indigestion   FIBER PO Take by mouth.   fluticasone (FLONASE) 50 MCG/ACT nasal spray instill TWO SPRAYS in each nostril daily (shake gently)   meclizine (ANTIVERT) 25 MG tablet Take 1 tablet (25 mg total) by mouth 3 (three) times daily as needed for dizziness.   memantine (NAMENDA) 10 MG tablet Take 10 mg by mouth 2 (two) times daily.   Multiple Vitamin (MULTIVITAMIN) tablet Take 1 tablet by mouth daily.   Multiple  Vitamins-Minerals (HAIR/SKIN/NAILS/BIOTIN PO) Take 1 tablet by mouth daily.   MYRBETRIQ 25 MG TB24 tablet TAKE ONE TABLET BY MOUTH ONCE DAILY   pantoprazole (PROTONIX) 40 MG tablet Take 1 tablet (40 mg total) by mouth daily as needed (GERD symptoms).   PEDIASURE/FIBER (PEDIASURE/FIBER) LIQD Take 10 mLs by mouth.   polyethylene glycol (MIRALAX / GLYCOLAX) 17 g packet Take 8.5-17 g by mouth daily as needed.   PREMARIN vaginal cream USE A pea-sized AMOUNT externally AND internally twice A WEEK AT night   rosuvastatin (CRESTOR) 10 MG tablet TAKE ONE TABLET BY MOUTH EVERYDAY AT BEDTIME   WIXELA INHUB 250-50 MCG/ACT AEPB INHALE 1 PUFF BY MOUTH INTO LUNGS in THE morning AND AT bedtime   No facility-administered encounter medications on file as of 06/10/2022.   BP Readings from Last 3 Encounters:  04/18/22 130/60  04/17/22 134/72  01/07/22 110/72    Lab Results  Component Value Date   HGBA1C 5.6 09/11/2021      Recent OV, Consult or Hospital visit:  No medication changes indicated  04/23/22  Neuro-Zach Potter,suggested to stop famotidine,pantoprazole,patient states she needs the medications for refulx   /Last adherence delivery date:03/21/22      Patient is due for next adherence delivery on: 06/20/22  Spoke with patient on 06/11/22 reviewed medications and coordinated delivery.  This delivery to include: Adherence Packaging  90 Days  Packs: Donepezil 10 mg one daily - 1 bedtime Famotidine 20 mg one daily - 1 breakfast, 1 evening meal Memantine 10 mg - 1 breakfast, 1 evening meal   Rosuvastatin 10 mg one daily - 1 bedtime Myrbetriq 25 mg -- take 1  tablet at breakfast  Clopidogrel 75mg - take 1 tablet breakfast Pantoprazole 40mg - take 1 tablet breakfast Amlodipine 5mg - take 1 tablet breakfast  VIAL medications:  Wixela Inhub 250-50  inhale 1 puff in morning and 1 puff bedtime      Patient declined the following medications this month:  Fluticasone nasal spray- 2 sprays in each  nostril as needed   Any concerns about your medications? No  How often do you forget or accidentally miss a dose? Rarely   Is patient in packaging Yes  What is the date on your next pill pack? Not home to read pack date  Any concerns or issues with your packaging? Satisfied    Refills requested from providers include:donepizil,memantine,rosuvastatin, myrbetriq,famotidine  Confirmed delivery date of 06/20/22, advised patient that pharmacy will contact them the morning of delivery.  Recent blood pressure readings are as follows:none available     Annual wellness visit in last year? Yes Most Recent BP reading:130/60  04/18/22   Cycle dispensing form sent to Margaretmary Dys, PTM for review.   Charlene Brooke, CPP notified  Avel Sensor, [place]  [phone]

## 2022-06-18 ENCOUNTER — Encounter: Payer: Self-pay | Admitting: Pulmonary Disease

## 2022-06-18 ENCOUNTER — Ambulatory Visit: Payer: PPO | Admitting: Pulmonary Disease

## 2022-06-18 VITALS — BP 118/70 | HR 61 | Temp 98.3°F | Ht 63.0 in | Wt 143.0 lb

## 2022-06-18 DIAGNOSIS — J4530 Mild persistent asthma, uncomplicated: Secondary | ICD-10-CM | POA: Diagnosis not present

## 2022-06-18 DIAGNOSIS — G4733 Obstructive sleep apnea (adult) (pediatric): Secondary | ICD-10-CM

## 2022-06-18 DIAGNOSIS — R0602 Shortness of breath: Secondary | ICD-10-CM | POA: Diagnosis not present

## 2022-06-18 NOTE — Patient Instructions (Signed)
Continue the [facility] for now.  Your lungs sounded very good today.  I recommend that you get the RSV vaccine this year this can be obtained through [facility] or [facility].  It can be given at the same time as the flu shot.  We will see you in follow-up in 6 months time call sooner should any new problems arise.

## 2022-06-18 NOTE — Progress Notes (Signed)
Subjective:    Patient ID: Kathy Baldwin, female    DOB: 10-Aug-1946, 76 y.o.   MRN: [id number] Patient Care Team: [name], MD as PCP - General (Family Medicine) Midge Minium, MD as Consulting Physician (Gastroenterology) Pa, [facility] (Optometry) Solum, Marlana Salvage, MD as Physician Assistant (Endocrinology) Linus Salmons, MD as Consulting Physician (Otolaryngology) Lockie Mola, MD as Referring Physician (Ophthalmology) Kathyrn Sheriff, [facility] as Pharmacist (Pharmacist)  Chief Complaint  Patient presents with   Follow-up    SOB with exertion. No cough or wheezing.     HPI The patient is a 76 year old lifelong never smoker who presents for evaluation of dyspnea and fatigue with onset around the time of his stroke in January 2023. Patient has some mild memory impairment so she is accompanied today by her husband, Kathy Baldwin.  We last saw her on 20 December 2021 and at that time she was instructed to continue Wixela 250/51 inhalation twice a day.  She had had pulmonary function testing in March that were consistent with airway obstruction of the asthmatic type.  She states that in reality she started having some dyspnea on exertion low-dose to a year now.  Notices it more when she was doing her gardening chores.  She states that stress and exertion make it worse and rest makes it better.  Since starting Wixela she states that the symptoms are more manageable.    Recall that on 11 September 2021 she had a stroke and since then has noted fatigue.  This was manifested by left arm weakness. Received tPA with resolution of her stroke symptoms.  She has a history of obstructive sleep apnea diagnosed approximately 7 years ago and is on CPAP through [place].  Her current machine is approximately 78 years old.  Dr. Sharen Hones manages this issue.  She states that she is compliant with the therapy.    She has not had any cough, no wheezing, no fevers, chills or sweats.  Because of her  complaint of dyspnea Dr. Sharen Hones did a initial workup and a D-dimer was ordered.  This showed some mild elevation so a CT angio ensued.  CT angio did not show PE however did show some evidence of small airways disease such as can be seen with asthma.  The patient does not endorse any other symptomatology.  She is currently on Wixela (Advair generic) 250/50, he is compliant with the therapy and notes that this is helping her.  DATA 12/07/2021 PFTs: FEV1 1.95 L or 86% predicted, FVC 2.61 L or 78% predicted, FEV1/FVC 74%.  Lung volumes were, normal diffusion capacity normal, mild small airways component.  No significant bronchodilator response.  The results of the testing however are limited due to the patient having a difficult time performing the test in general.  Review of Systems A 10 point review of systems was performed and it is as noted above otherwise negative.  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   Small vessel disease, 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   Carotid stenosis, bilateral  10/08/2018   Thyroid nodule 04/14/2018   DNR no code (do not resuscitate) 03/20/2017   Vaginal atrophy 02/18/2017   Aortic atherosclerosis (HCC) 09/05/2016   Decreased hearing, bilateral 12/04/2015   Health maintenance examination 12/04/2015   Hypertension    ASCUS with positive high risk human papillomavirus of vagina    GERD (gastroesophageal reflux disease)    OSA on CPAP    Hypertrophy of nasal  turbinates    Hyperlipidemia 03/28/2015   Allergic rhinitis 03/28/2015   History of decompression of median nerve 02/14/2014   Osteopenia 06/09/2013   Social History   Tobacco Use   Smoking status: Never   Smokeless tobacco: Never   Tobacco comments:    Never smoked, so cannot quit...  Substance Use Topics   Alcohol use: Not Currently    Alcohol/week: 0.0 standard drinks of alcohol    Comment: Occasional celebratory drink (holidays, etc.) only.   Allergies  Allergen Reactions   Cabbage Other (See Comments)    Hypersensitivity on allergy testing   Chocolate Other (See Comments)    Hypersensitivity on allergy testing   Statins Other (See Comments)    Zocor and Pravastatin--leg cramps Other reaction(s): Other (See Comments) Zocor and Pravastatin--leg cramps   Current Meds  Medication Sig   amLODipine (NORVASC) 5 MG tablet Take 1 tablet (5 mg total) by mouth daily.   aspirin EC 81 MG EC tablet Take 1 tablet (81 mg total) by mouth daily. Swallow whole.   Calcium Carb-Cholecalciferol (CALCIUM 600+D) 600-800 MG-UNIT TABS Take by mouth daily. 2 capsule daily   clopidogrel (PLAVIX) 75 MG tablet Take 1 tablet (75 mg total) by mouth daily.   COLLAGEN PO Take by mouth.   Cyanocobalamin (B-12) 2500 MCG TABS Take 1 tablet by mouth daily. Pt taking 1/2 of a 5000 mcg tablet   donepezil (ARICEPT) 10 MG tablet Take 10 mg by mouth at bedtime.    famotidine (PEPCID) 20 MG tablet Take 1 tablet (20 mg total) by mouth 2 (two) times daily. For heartburn/indigestion   FIBER PO Take by mouth.   fluticasone (FLONASE) 50 MCG/ACT nasal spray instill TWO SPRAYS in each nostril daily (shake gently)   meclizine (ANTIVERT) 25 MG tablet Take 1 tablet (25 mg total) by mouth 3 (three) times daily as needed for dizziness.   memantine (NAMENDA) 10 MG tablet Take 10 mg by mouth 2 (two) times daily.   Multiple Vitamin (MULTIVITAMIN) tablet Take 1 tablet by mouth daily.   Multiple Vitamins-Minerals  (HAIR/SKIN/NAILS/BIOTIN PO) Take 1 tablet by mouth daily.   MYRBETRIQ 25 MG TB24 tablet TAKE ONE TABLET BY MOUTH ONCE DAILY   pantoprazole (PROTONIX) 40 MG tablet Take 1 tablet (40 mg total) by mouth daily as needed (GERD symptoms).   PEDIASURE/FIBER (PEDIASURE/FIBER) LIQD Take 10 mLs by mouth.   polyethylene glycol (MIRALAX / GLYCOLAX) 17 g packet Take 8.5-17 g by mouth daily as needed.   PREMARIN vaginal cream USE A pea-sized AMOUNT externally AND internally twice A WEEK AT night   rosuvastatin (CRESTOR) 10 MG tablet TAKE ONE TABLET BY MOUTH EVERYDAY AT BEDTIME   WIXELA INHUB 250-50 MCG/ACT AEPB INHALE 1 PUFF BY MOUTH INTO LUNGS in THE morning AND AT bedtime   Immunization History  Administered Date(s) Administered   Fluad Quad(high Dose 65+) 05/12/2020, 06/08/2021   Influenza, High Dose Seasonal PF 06/27/2017, 05/21/2018   Influenza, Quadrivalent, Recombinant, Inj, Pf 07/05/2019   Influenza, Seasonal, Injecte, Preservative Fre 05/25/2015   Influenza-Unspecified 06/12/2011, 05/10/2014, 06/22/2016, 06/27/2017  Moderna Covid-19 Vaccine Bivalent Booster 68yrs & up 06/14/2021   Moderna SARS-COV2 Booster Vaccination 01/26/2021   Moderna Sars-Covid-2 Vaccination 10/22/2019, 11/19/2019, 07/03/2020   Pneumococcal Conjugate-13 11/30/2014   Pneumococcal Polysaccharide-23 08/13/2011, 10/01/2013   Td 06/09/2002   Tdap 09/10/2011, 08/13/2012   Zoster Recombinat (Shingrix) 04/27/2018, 06/29/2018   Zoster, Live 08/13/2012        Objective:   Physical Exam BP 118/70 (BP Location: Left Arm, Cuff Size: Normal)   Pulse 61   Temp 98.3 F (36.8 C)   Ht 5\' 3"  (1.6 m)   Wt 143 lb (64.9 kg)   SpO2 96%   BMI 25.33 kg/m   SpO2: 96 %  GENERAL: Well-developed, well-nourished woman, no acute distress, fully ambulatory. HEAD: Normocephalic, atraumatic.  EYES: Pupils equal, round, reactive to light.  No scleral icterus.  MOUTH: Dentition intact, oral mucosa moist.  No thrush. NECK: Supple. No  thyromegaly. Trachea midline. No JVD.  No adenopathy. PULMONARY: Good air entry bilaterally.  No adventitious sounds. CARDIOVASCULAR: S1 and S2. Regular rate and rhythm.  No rubs, murmurs or gallops heard. ABDOMEN: Benign. MUSCULOSKELETAL: No joint deformity, no clubbing, no edema.  NEUROLOGIC: No overt focal deficit, no gait disturbance, speech is fluent. SKIN: Intact,warm,dry. PSYCH: Mildly befuddled.      Assessment & Plan:   .......................   ICD-10-CM   1. 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 SOB (shortness of breath)  R06.02    Improved on Wixela Continue Wixela Would benefit from conditioning program    3. OSA on CPAP  G47.33    Patient states compliant with CPAP Managed by primary care     Will see the patient in follow-up in 6 months time she is to contact us prior to that time should any new difficulties arise.  Gailen Shelter, MD Advanced Bronchoscopy PCCM [facility]    *This note was dictated using voice recognition software/Dragon.  Despite best efforts to proofread, errors can occur which can change the meaning. Any transcriptional errors that result from this process are unintentional and may not be fully corrected at the time of dictation.

## 2022-08-13 ENCOUNTER — Telehealth: Payer: Self-pay

## 2022-08-13 NOTE — Chronic Care Management (AMB) (Addendum)
Chronic Care Management Pharmacy Assistant   Name: Kathy Baldwin  MRN: [id number] DOB: 1946/02/24   Reason for Encounter:Hypertension  Disease State   Recent office visits:  09/04/22-Kathy Gutierrez,MD(PCP)- phone encounter- new pharmacy [facility] requesting med list to be faxed over as they will be new pharmacy provider for patient in 2024.  Recent consult visits:  06/18/22-Kathy Gonzalez,MD(pulmo)-f/u , continue wixela, recomment RSV vaccine f/u 6 months  Hospital visits:  None in previous 6 months  Medications: Outpatient Encounter Medications as of 08/13/2022  Medication Sig   amLODipine (NORVASC) 5 MG tablet Take 1 tablet (5 mg total) by mouth daily.   aspirin EC 81 MG EC tablet Take 1 tablet (81 mg total) by mouth daily. Swallow whole.   Calcium Carb-Cholecalciferol (CALCIUM 600+D) 600-800 MG-UNIT TABS Take by mouth daily. 2 capsule daily   clopidogrel (PLAVIX) 75 MG tablet Take 1 tablet (75 mg total) by mouth daily.   COLLAGEN PO Take by mouth.   Cyanocobalamin (B-12) 2500 MCG TABS Take 1 tablet by mouth daily. Pt taking 1/2 of a 5000 mcg tablet   donepezil (ARICEPT) 10 MG tablet Take 10 mg by mouth at bedtime.    famotidine (PEPCID) 20 MG tablet Take 1 tablet (20 mg total) by mouth 2 (two) times daily. For heartburn/indigestion   FIBER PO Take by mouth.   fluticasone (FLONASE) 50 MCG/ACT nasal spray instill TWO SPRAYS in each nostril daily (shake gently)   meclizine (ANTIVERT) 25 MG tablet Take 1 tablet (25 mg total) by mouth 3 (three) times daily as needed for dizziness.   memantine (NAMENDA) 10 MG tablet Take 10 mg by mouth 2 (two) times daily.   Multiple Vitamin (MULTIVITAMIN) tablet Take 1 tablet by mouth daily.   Multiple Vitamins-Minerals (HAIR/SKIN/NAILS/BIOTIN PO) Take 1 tablet by mouth daily.   MYRBETRIQ 25 MG TB24 tablet TAKE ONE TABLET BY MOUTH ONCE DAILY   pantoprazole (PROTONIX) 40 MG tablet Take 1 tablet (40 mg total) by mouth daily as needed  (GERD symptoms).   PEDIASURE/FIBER (PEDIASURE/FIBER) LIQD Take 10 mLs by mouth.   polyethylene glycol (MIRALAX / GLYCOLAX) 17 g packet Take 8.5-17 g by mouth daily as needed.   PREMARIN vaginal cream USE A pea-sized AMOUNT externally AND internally twice A WEEK AT night   rosuvastatin (CRESTOR) 10 MG tablet TAKE ONE TABLET BY MOUTH EVERYDAY AT BEDTIME   WIXELA INHUB 250-50 MCG/ACT AEPB INHALE 1 PUFF BY MOUTH INTO LUNGS in THE morning AND AT bedtime   No facility-administered encounter medications on file as of 08/13/2022.    Recent Office Vitals: BP Readings from Last 3 Encounters:  06/18/22 118/70  04/18/22 130/60  04/17/22 134/72   Pulse Readings from Last 3 Encounters:  06/18/22 61  04/18/22 71  04/17/22 63    Wt Readings from Last 3 Encounters:  06/18/22 143 lb (64.9 kg)  04/18/22 146 lb 8 oz (66.5 kg)  04/17/22 145 lb 6 oz (65.9 kg)     Kidney Function Lab Results  Component Value Date/Time   CREATININE 0.97 04/08/2022 08:59 AM   CREATININE 0.88 11/06/2021 10:15 AM   CREATININE 0.78 10/08/2018 11:56 AM   GFR 56.31 (L) 04/08/2022 08:59 AM   GFRNONAA >60 09/13/2021 06:17 AM   GFRNONAA 75 10/08/2018 11:56 AM   GFRAA 87 10/08/2018 11:56 AM       Latest Ref Rng & Units 04/08/2022    8:59 AM 11/06/2021   10:15 AM 09/13/2021    6:17 AM  BMP  Glucose 70 - 99 mg/dL 96  74  545   BUN 6 - 23 mg/dL 17  21  19    Creatinine 0.40 - 1.20 mg/dL  6.25  6.38   Sodium 135 - 145 mEq/L 142  144  141   Potassium 3.5 - 5.1 mEq/L 3.9  3.8  3.6   Chloride 96 - 112 mEq/L 104  108  108   CO2 19 - 32 mEq/L 29  30  26    Calcium 8.4 - 10.5 mg/dL 9.8  9.7  9.2      Contacted patient on 09/06/22 to discuss hypertension disease state  Current antihypertensive regimen:   Amlodipine 5mg  -take 1 tablet daily    Patient verbally confirms she is taking the above medications as directed. Yes  How often are you checking your Blood Pressure? infrequently  she checks her blood pressure in  the morning after taking her medication.  Current home BP readings:  random throughout the month   DATE:             BP               PULSE    116/76  -    121/74  -    109/72  -  Wrist or arm cuff:  wrist cuff and has a pulse ox Caffeine intake: about every other day 1 cup regular coffee in am  Salt intake: Limits adding to food OTC medications including pseudoephedrine or NSAIDs? No  Any readings above 180/120? No   What recent interventions/DTPs have been made by any provider to improve Blood Pressure control since last CPP Visit:  Patient reports able to keep BP <140/90, patient is not missing any doses of BP medication  Any recent hospitalizations or ED visits since last visit with CPP? No  What diet changes have been made to improve Blood Pressure Control?   Follows DASH diet  What exercise is being done to improve your Blood Pressure Control?   Patient reports limited due to intermittent asthma.Limited physical activity  Adherence Review: Is the patient currently on ACE/ARB medication? No Does the patient have >5 day gap between last estimated fill dates? No  Star Rating Drugs:  Medication:  Last Fill: Day Supply Rosuvastatin 10mg  06/18/22 90   Patient reports that in 2024 her insurance HTA will no longer consider [facility] and [facility] pharmacy as in network  The patient and husband Kathy Baldwin (05/10/48) will be switching to [facility] for medications in 2024.  2025, CPP notified  Kathy Baldwin, [facility] Health concierge  [phone]   Care Gaps: Annual wellness visit in last year? Yes Most Recent BP reading:118/70   Summary of recommendations from last CCM Pharmacy visit (Date:06/04/22) Summary: CCM F/U visit -Reviewed medications, pt affirms compliance as prescribed -Pt reports not checking BP often, but has not seen readings > 140/90 lately   Recommendations/Changes made from today's visit: -No med changes  No appointments  scheduled within the next 30 days.   Burt Knack, CPP notified  [facility], [facility] concierge  [phone]

## 2022-09-04 ENCOUNTER — Telehealth: Payer: Self-pay | Admitting: Family Medicine

## 2022-09-04 NOTE — Telephone Encounter (Signed)
[facility] is requesting an updated medication list for this patient to be faxed over to [phone]. Patient is transferring to their pharmacy.

## 2022-09-04 NOTE — Telephone Encounter (Signed)
Fax as requested.  No further action needed at this time.

## 2022-09-06 ENCOUNTER — Other Ambulatory Visit: Payer: Self-pay | Admitting: Family Medicine

## 2022-09-06 DIAGNOSIS — K219 Gastro-esophageal reflux disease without esophagitis: Secondary | ICD-10-CM

## 2022-09-06 DIAGNOSIS — J984 Other disorders of lung: Secondary | ICD-10-CM

## 2022-09-06 NOTE — Telephone Encounter (Addendum)
Noted patient will be switching pharmacies in 2024 due to insurance changes.  Per HTA website [facility] will not be a preferred pharmacy in 2024 either (there is another [facility] in [place] that is preferred, but not the one in [place]). They are welcome to switch but if they are wanting a preferred pharmacy the ones that are local to her include [facility], [facility], [facility], [facility] in [place], [facility] in [place].

## 2022-09-06 NOTE — Telephone Encounter (Signed)
Notified patient about [facility] not in network either The patient was out of the house and stated they may have misread one bottle, so they will check upon returning to home. Relayed preferred pharmacies,  Al Corpus, CPP notified  Burt Knack, [facility] Health concierge  [phone]

## 2022-09-24 ENCOUNTER — Encounter: Payer: Self-pay | Admitting: Family Medicine

## 2022-09-24 ENCOUNTER — Ambulatory Visit (INDEPENDENT_AMBULATORY_CARE_PROVIDER_SITE_OTHER): Payer: PPO | Admitting: Family Medicine

## 2022-09-24 VITALS — BP 128/66 | HR 66 | Temp 97.3°F | Ht 63.0 in | Wt 150.1 lb

## 2022-09-24 DIAGNOSIS — M79662 Pain in left lower leg: Secondary | ICD-10-CM | POA: Diagnosis not present

## 2022-09-24 DIAGNOSIS — G5732 Lesion of lateral popliteal nerve, left lower limb: Secondary | ICD-10-CM | POA: Diagnosis not present

## 2022-09-24 MED ORDER — GABAPENTIN 100 MG PO CAPS: 100.0000 mg | ORAL_CAPSULE | Freq: Two times a day (BID) | ORAL | 1 refills | Status: DC | PRN

## 2022-09-24 NOTE — Patient Instructions (Addendum)
Try gabapentin 100mg  in the morning, may take second dose early afternoon.  We will refer you to physical therapy in [facility]).  Update [place] with effect.  Depending on results, may send you to ortho vs Dr Lorelei Pont or neurologist.

## 2022-09-24 NOTE — Progress Notes (Signed)
Patient ID: Kathy Baldwin, female    DOB: 1945/10/07, 77 y.o.   MRN: [id number]  This visit was conducted in person.  BP 128/66   Pulse 66   Temp (!) 97.3 F (36.3 C) (Temporal)   Ht 5\' 3"  (1.6 m)   Wt 150 lb 2 oz (68.1 kg)   SpO2 97%   BMI 26.59 kg/m    CC: L leg pain  Subjective:   HPI: Kathy Baldwin is a 77 y.o. female presenting on 09/24/2022 for Leg Pain (C/o L leg pain- worsened in past few wks.. H/o surgery. Thinks nerve pain.  Pt accompanied by husband, Barnabas Lister. )   2 month history of worsening L leg pain at site of prior surgical incisions. ?nerve pain. Occasional lateral L toe cramping, decreased sensation, numbness/tingling. Entire L foot stays cold and great toe numbness. Describes ache, burning pain. No sharp stabbing pain or burning pain. No significant back pain or left buttock pain. Worse pain with prolonged sitting. Not as bad with prolonged walking or standing, no trouble when supine. Also notes both feet have started sweating more frequently.   H/o left lower leg surgery ~03/2019 to remove skin cyst (Dr Sherlynn Stalls ortho at [facility]). This was delayed and she was told she had nerve damage.   Denies inciting trauma/injury or falls.  She's tried voltaren with limited benefit.      Relevant past medical, surgical, family and social history reviewed and updated as indicated. Interim medical history since our last visit reviewed. Allergies and medications reviewed and updated. Outpatient Medications Prior to Visit  Medication Sig Dispense Refill   amLODipine (NORVASC) 5 MG tablet Take 1 tablet (5 mg total) by mouth daily. 90 tablet 3   aspirin EC 81 MG EC tablet Take 1 tablet (81 mg total) by mouth daily. Swallow whole. 21 tablet 0   Calcium Carb-Cholecalciferol (CALCIUM 600+D) 600-800 MG-UNIT TABS Take by mouth daily. 2 capsule daily     clopidogrel (PLAVIX) 75 MG tablet Take 1 tablet (75 mg total) by mouth daily. 90 tablet 0   COLLAGEN PO Take by mouth.      Cyanocobalamin (B-12) 2500 MCG TABS Take 1 tablet by mouth daily. Pt taking 1/2 of a 5000 mcg tablet     donepezil (ARICEPT) 10 MG tablet Take 10 mg by mouth at bedtime.      famotidine (PEPCID) 20 MG tablet Take 1 tablet (20 mg total) by mouth 2 (two) times daily. For heartburn/indigestion 180 tablet 3   FIBER PO Take by mouth.     fluticasone (FLONASE) 50 MCG/ACT nasal spray instill TWO SPRAYS in each nostril daily (shake gently) 48 g 3   meclizine (ANTIVERT) 25 MG tablet Take 1 tablet (25 mg total) by mouth 3 (three) times daily as needed for dizziness. 30 tablet 0   memantine (NAMENDA) 10 MG tablet Take 10 mg by mouth 2 (two) times daily.     Multiple Vitamin (MULTIVITAMIN) tablet Take 1 tablet by mouth daily.     MYRBETRIQ 25 MG TB24 tablet TAKE ONE TABLET BY MOUTH ONCE DAILY 90 tablet 3   pantoprazole (PROTONIX) 40 MG tablet TAKE ONE TABLET BY MOUTH ONCE DAILY FOR gerd symptoms 90 tablet 2   PEDIASURE/FIBER (PEDIASURE/FIBER) LIQD Take 10 mLs by mouth.     polyethylene glycol (MIRALAX / GLYCOLAX) 17 g packet Take 8.5-17 g by mouth daily as needed.     PREMARIN vaginal cream USE A pea-sized AMOUNT externally AND internally twice A  WEEK AT night 30 g 1   rosuvastatin (CRESTOR) 10 MG tablet TAKE ONE TABLET BY MOUTH EVERYDAY AT BEDTIME 90 tablet 3   WIXELA INHUB 250-50 MCG/ACT AEPB INHALE 1 PUFF BY MOUTH INTO LUNGS in THE morning AND AT bedtime 60 each 3   Multiple Vitamins-Minerals (HAIR/SKIN/NAILS/BIOTIN PO) Take 1 tablet by mouth daily.     No facility-administered medications prior to visit.     Per HPI unless specifically indicated in ROS section below Review of Systems  Objective:  BP 128/66   Pulse 66   Temp (!) 97.3 F (36.3 C) (Temporal)   Ht 5\' 3"  (1.6 m)   Wt 150 lb 2 oz (68.1 kg)   SpO2 97%   BMI 26.59 kg/m   Wt Readings from Last 3 Encounters:  09/24/22 150 lb 2 oz (68.1 kg)  06/18/22 143 lb (64.9 kg)  04/18/22 146 lb 8 oz (66.5 kg)      Physical Exam Vitals and  nursing note reviewed.  Constitutional:      Appearance: Normal appearance. She is not ill-appearing.     Comments: Unsteady getting on exam table   Musculoskeletal:        General: Tenderness present. Normal range of motion.     Right lower leg: No edema.     Left lower leg: No edema.     Comments:  2+ DP bilaterally Tender to palpation left lateral leg along prior incisions  Skin:    General: Skin is warm and dry.     Findings: No erythema or rash.     Comments: 2 separate incisions to lateral left lower leg at prior surgery sites, incisions c/d/i  Neurological:     Mental Status: She is alert.     Sensory: Sensory deficit present.     Motor: Weakness present. No abnormal muscle tone.     Coordination: Coordination is intact.     Gait: Gait is intact.     Deep Tendon Reflexes:     Reflex Scores:      Patellar reflexes are 3+ on the right side and 3+ on the left side.      Achilles reflexes are 2+ on the right side and 2+ on the left side.    Comments:  5/5 strength RLE Markedly diminished strength to left foot dorsiflexion and eversion against resistance, mildly diminished strength to left hip flexors Diminished sensation to light touch along left medial foot  Psychiatric:        Mood and Affect: Mood normal.        Behavior: Behavior normal.       Results for orders placed or performed in visit on 04/08/22  VITAMIN D 25 Hydroxy (Vit-D Deficiency, Fractures)  Result Value Ref Range   VITD 51.72 30.00 - 100.00 ng/mL  Lipid panel  Result Value Ref Range   Cholesterol 170 0 - 200 mg/dL   Triglycerides 65.0 0.0 - 149.0 mg/dL   HDL 84.90 >39.00 mg/dL   VLDL 13.0 0.0 - 40.0 mg/dL   LDL Cholesterol 72 0 - 99 mg/dL   Total CHOL/HDL Ratio 2    NonHDL 84.69   Comprehensive metabolic panel  Result Value Ref Range   Sodium 142 135 - 145 mEq/L   Potassium 3.9 3.5 - 5.1 mEq/L   Chloride 104 96 - 112 mEq/L   CO2 29 19 - 32 mEq/L   Glucose, Bld 96 70 - 99 mg/dL   BUN 17 6 -  23 mg/dL  Creatinine, Ser 0.97 0.40 - 1.20 mg/dL   Total Bilirubin 0.8 0.2 - 1.2 mg/dL   Alkaline Phosphatase 67 39 - 117 U/L   AST 31 0 - 37 U/L   ALT 18 0 - 35 U/L   Total Protein 7.3 6.0 - 8.3 g/dL   Albumin 4.4 3.5 - 5.2 g/dL   GFR 32.95 (L) >18.84 mL/min   Calcium 9.8 8.4 - 10.5 mg/dL   Lab Results  Component Value Date   VITAMINB12 >1550 (H) 09/26/2021    Assessment & Plan:   Problem List Items Addressed This Visit     Peroneal nerve palsy, left - Primary    Pain to left lateral leg into foot associated with numbness of dorsal foot into great toe and weakness along peroneal distribution in setting of prior surgery 2020 for removal of cyst overlying peroneal nerve. Symptoms consistent with peroneal nerve palsy. For pain component will trial low dose gabapentin - reviewed side effects including sedation and mood changes. For ongoing weakness offered course of PT. Update if not improving for referral to neuro vs ortho. Rec trial B complex vitamin for a month.       Relevant Medications   gabapentin (NEURONTIN) 100 MG capsule   Other Relevant Orders   Ambulatory referral to Physical Therapy   Other Visit Diagnoses     Pain in left lower leg       Relevant Orders   Ambulatory referral to Physical Therapy        Meds ordered this encounter  Medications   gabapentin (NEURONTIN) 100 MG capsule    Sig: Take 1 capsule (100 mg total) by mouth 2 (two) times daily as needed (left leg pain - sedation precautions).    Dispense:  60 capsule    Refill:  1    Orders Placed This Encounter  Procedures   Ambulatory referral to Physical Therapy    Referral Priority:   Routine    Referral Type:   Physical Medicine    Referral Reason:   Specialty Services Required    Requested Specialty:   Physical Therapy    Number of Visits Requested:   1    Patient Instructions  Try gabapentin 100mg  in the morning, may take second dose early afternoon.  We will refer you to physical therapy  in [facility]).  Update [place] with effect.  Depending on results, may send you to ortho vs Dr [place] or neurologist.   Follow up plan: Return if symptoms worsen or fail to improve.  Patsy Lager, MD

## 2022-09-26 DIAGNOSIS — G5732 Lesion of lateral popliteal nerve, left lower limb: Secondary | ICD-10-CM | POA: Insufficient documentation

## 2022-09-26 NOTE — Assessment & Plan Note (Addendum)
Pain to left lateral leg into foot associated with numbness of dorsal foot into great toe and weakness along peroneal distribution in setting of prior surgery 2020 for removal of cyst overlying peroneal nerve. Symptoms consistent with peroneal nerve palsy. For pain component will trial low dose gabapentin - reviewed side effects including sedation and mood changes. For ongoing weakness offered course of PT. Update if not improving for referral to neuro vs ortho. Rec trial B complex vitamin for a month.

## 2022-09-27 ENCOUNTER — Encounter: Payer: Self-pay | Admitting: *Deleted

## 2022-10-17 ENCOUNTER — Other Ambulatory Visit: Payer: Self-pay | Admitting: Family Medicine

## 2022-10-18 NOTE — Telephone Encounter (Signed)
Refill request Gabapentin Last refill 09/24/22 #60/1 Last office visit 09/24/22

## 2022-11-16 ENCOUNTER — Encounter: Payer: Self-pay | Admitting: Pulmonary Disease

## 2022-12-13 ENCOUNTER — Other Ambulatory Visit: Payer: Self-pay | Admitting: Family Medicine

## 2022-12-20 ENCOUNTER — Encounter: Payer: Self-pay | Admitting: Pulmonary Disease

## 2022-12-27 ENCOUNTER — Encounter: Payer: Self-pay | Admitting: Pulmonary Disease

## 2023-01-14 ENCOUNTER — Encounter: Payer: Self-pay | Admitting: Family Medicine

## 2023-01-17 ENCOUNTER — Other Ambulatory Visit: Payer: Self-pay | Admitting: Family Medicine

## 2023-01-17 DIAGNOSIS — G5732 Lesion of lateral popliteal nerve, left lower limb: Secondary | ICD-10-CM

## 2023-01-17 NOTE — Telephone Encounter (Signed)
Faxed written rx to [facility] at [phone].

## 2023-01-17 NOTE — Telephone Encounter (Signed)
Gabapentin Last filled:  11/14/22, #60 Last OV:  09/24/22, L leg pain Next OV:  none

## 2023-01-17 NOTE — Telephone Encounter (Signed)
ERx 

## 2023-01-17 NOTE — Telephone Encounter (Signed)
Rx written and in Lisa's box.  

## 2023-01-24 ENCOUNTER — Encounter: Payer: Self-pay | Admitting: Family Medicine

## 2023-01-24 ENCOUNTER — Ambulatory Visit (INDEPENDENT_AMBULATORY_CARE_PROVIDER_SITE_OTHER): Payer: PPO | Admitting: Family Medicine

## 2023-01-24 VITALS — BP 146/86 | HR 67 | Temp 97.3°F | Ht 63.0 in | Wt 151.1 lb

## 2023-01-24 DIAGNOSIS — F320 Major depressive disorder, single episode, mild: Secondary | ICD-10-CM | POA: Insufficient documentation

## 2023-01-24 DIAGNOSIS — F03A3 Unspecified dementia, mild, with mood disturbance: Secondary | ICD-10-CM | POA: Diagnosis not present

## 2023-01-24 MED ORDER — BUPROPION HCL ER (SR) 100 MG PO TB12: 100.0000 mg | ORAL_TABLET | Freq: Every day | ORAL | 3 refills | Status: DC

## 2023-01-24 NOTE — Progress Notes (Signed)
Ph: [phone] Fax: [phone]   Patient ID: Kathy Baldwin, female    DOB: [birth year], 77 y.o.   MRN: [id number]  This visit was conducted in person.  BP (!) 146/86   Pulse 67   Temp (!) 97.3 F (36.3 C) (Temporal)   Ht 5\' 3"  (1.6 m)   Wt 151 lb 2 oz (68.5 kg)   SpO2 96%   BMI 26.77 kg/m    CC: depression evaluation Subjective:   HPI: Kathy Baldwin is a 77 y.o. female presenting on 01/24/2023 for Depression (Here for a depression assessment. Pt accompanied by husband, [name]. )   Notes ongoing fatigue, mild depressed mood over the past several months. Husband notes she's "in a funk" and zones out frequently while reading a book or watching TV, taking more naps during the day, going to sleep earlier at night time. Notes increased appetite. Moving slower than previously. No guilt feelings, still enjoys reading and knitting, working in the yard (no anhedonia), no difficulty with attention. No SI/HI.  Notes ongoing left leg pain - h/o surgeries in the past.  Staying worried some.  No seizure history.  Has not been on mood medications previously.   Notes worsening memory - forgetting how to get to store. She continues driving, sometimes drives alone. She has h/o mild dementia followed by [facility] neurology Dr Malvin Johns on aricept 10mg  nightly and namenda 10mg  twice daily. Considering Leqembi infusions through [facility].   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      Relevant past medical, surgical, family and social history reviewed and updated as indicated. Interim medical history since our last visit reviewed. Allergies and medications reviewed and updated. Outpatient Medications Prior to Visit  Medication Sig Dispense Refill   amLODipine (NORVASC) 5 MG tablet TAKE 1 TABLET BY MOUTH ONCE DAILY 90 tablet 0   aspirin EC 81 MG EC tablet Take 1 tablet (81 mg total) by mouth  daily. Swallow whole. 21 tablet 0   Calcium Carb-Cholecalciferol (CALCIUM 600+D) 600-800 MG-UNIT TABS Take by mouth daily. 2 capsule daily     clopidogrel (PLAVIX) 75 MG tablet Take 1 tablet (75 mg total) by mouth daily. 90 tablet 0   COLLAGEN PO Take by mouth.     Cyanocobalamin (B-12) 2500 MCG TABS Take 1 tablet by mouth daily. Pt taking 1/2 of a 5000 mcg tablet     donepezil (ARICEPT) 10 MG tablet Take 10 mg by mouth at bedtime.      famotidine (PEPCID) 20 MG tablet Take 1 tablet (20 mg total) by mouth 2 (two) times daily. For heartburn/indigestion 180 tablet 3   FIBER PO Take by mouth.     fluticasone (FLONASE) 50 MCG/ACT nasal spray instill TWO SPRAYS in each nostril daily (shake gently) 48 g 3   gabapentin (NEURONTIN) 100 MG capsule TAKE 1 CAPSULE BY MOUTH TWICE DAILY AS NEEDED FOR LEFT LEG PAIN 60 capsule 1   meclizine (ANTIVERT) 25 MG tablet Take 1 tablet (25 mg total) by mouth 3 (three) times daily as needed for dizziness. 30 tablet 0   memantine (NAMENDA) 10 MG tablet Take 10 mg by mouth 2 (two) times daily.     Multiple Vitamin (MULTIVITAMIN) tablet Take 1 tablet by mouth daily.     MYRBETRIQ 25 MG TB24 tablet TAKE ONE TABLET BY MOUTH ONCE DAILY 90 tablet 3   pantoprazole (PROTONIX) 40 MG tablet  TAKE ONE TABLET BY MOUTH ONCE DAILY FOR gerd symptoms 90 tablet 2   PEDIASURE/FIBER (PEDIASURE/FIBER) LIQD Take 10 mLs by mouth.     polyethylene glycol (MIRALAX / GLYCOLAX) 17 g packet Take 8.5-17 g by mouth daily as needed.     PREMARIN vaginal cream USE A pea-sized AMOUNT externally AND internally twice A WEEK AT night 30 g 1   rosuvastatin (CRESTOR) 10 MG tablet TAKE ONE TABLET BY MOUTH EVERYDAY AT BEDTIME 90 tablet 3   WIXELA INHUB 250-50 MCG/ACT AEPB INHALE 1 PUFF BY MOUTH INTO LUNGS in THE morning AND AT bedtime 60 each 3   No facility-administered medications prior to visit.     Per HPI unless specifically indicated in ROS section below Review of Systems  Objective:  BP (!)  146/86   Pulse 67   Temp (!) 97.3 F (36.3 C) (Temporal)   Ht 5\' 3"  (1.6 m)   Wt 151 lb 2 oz (68.5 kg)   SpO2 96%   BMI 26.77 kg/m   Wt Readings from Last 3 Encounters:  01/24/23 151 lb 2 oz (68.5 kg)  09/24/22 150 lb 2 oz (68.1 kg)  06/18/22 143 lb (64.9 kg)      Physical Exam Vitals and nursing note reviewed.  Constitutional:      Appearance: Normal appearance. She is not ill-appearing.  HENT:     Mouth/Throat:     Mouth: Mucous membranes are moist.     Pharynx: Oropharynx is clear. No oropharyngeal exudate or posterior oropharyngeal erythema.  Eyes:     Extraocular Movements: Extraocular movements intact.     Conjunctiva/sclera: Conjunctivae normal.     Pupils: Pupils are equal, round, and reactive to light.  Neck:     Thyroid: No thyroid mass or thyromegaly.  Cardiovascular:     Rate and Rhythm: Normal rate and regular rhythm.     Pulses: Normal pulses.     Heart sounds: Normal heart sounds. No murmur heard. Pulmonary:     Effort: Pulmonary effort is normal. No respiratory distress.     Breath sounds: Normal breath sounds. No wheezing, rhonchi or rales.  Musculoskeletal:     Cervical back: Normal range of motion and neck supple.     Right lower leg: No edema.     Left lower leg: No edema.  Skin:    General: Skin is warm and dry.     Findings: No rash.  Neurological:     Mental Status: She is alert.  Psychiatric:        Mood and Affect: Mood normal.        Behavior: Behavior normal.          01/24/2023   11:31 AM 09/24/2022    3:23 PM 03/13/2022   10:03 AM 03/05/2021    9:55 AM 08/17/2019   12:13 PM  Depression screen PHQ 2/9  Decreased Interest 1 0 0 0 0  Down, Depressed, Hopeless 1 0 0 0 0  PHQ - 2 Score 2 0 0 0 0  Altered sleeping 1 0  0 0  Tired, decreased energy 1 3  0 1  Change in appetite 1 1  0 0  Feeling bad or failure about yourself  0 0  0 0  Trouble concentrating 0 0  0 0  Moving slowly or fidgety/restless 0 0  0 0  Suicidal thoughts 0 0  0  0  PHQ-9 Score 5 4  0 1  Difficult doing work/chores Somewhat difficult Somewhat  difficult          01/24/2023   11:31 AM 09/24/2022    3:23 PM 08/17/2019   12:13 PM  GAD 7 : Generalized Anxiety Score  Nervous, Anxious, on Edge 0 0 1  Control/stop worrying 0 0 0  Worry too much - different things 0 0 1  Trouble relaxing 0 0 0  Restless 0 0 0  Easily annoyed or irritable 1 0 1  Afraid - awful might happen 0 0 0  Total GAD 7 Score 1 0 3  Anxiety Difficulty Not difficult at all     Assessment & Plan:   Problem List Items Addressed This Visit     Mild dementia (HCC)    H/o mild dementia on aricept and namenda followed by neurology. This complicates care.  Husband notes worsening memory.  Recommend stop driving alone until re-evaluated by neurology.       Relevant Medications   buPROPion ER (WELLBUTRIN SR) 100 MG 12 hr tablet   MDD (major depressive disorder), single episode, mild (HCC) - Primary    Anticipate dementia limits sensitivity of PHQ9/GAD7.  According to husband, significant depressed symptoms present, predominant fatigue/low energy and increased appetite/weight. 8 lb weight gain over the past 6 months noted.  Will start wellbutrin SR 100mg  daily in am.  Reassess at CPE in 3 months. Pt/husband agree with plan.       Relevant Medications   buPROPion ER (WELLBUTRIN SR) 100 MG 12 hr tablet     Meds ordered this encounter  Medications   buPROPion ER (WELLBUTRIN SR) 100 MG 12 hr tablet    Sig: Take 1 tablet (100 mg total) by mouth daily.    Dispense:  30 tablet    Refill:  3    No orders of the defined types were placed in this encounter.   Patient Instructions  Start wellbutrin SR 100mg  once daily in the morning mood medicine which may help energy levels.  Let us know how you tolerate it.  Return in 3 months for physical/wellness visit  Follow up plan: Return in about 3 months (around 04/26/2023), or if symptoms worsen or fail to improve, for annual exam,  prior fasting for blood work, medicare wellness visit.  [name], MD

## 2023-01-24 NOTE — Patient Instructions (Addendum)
Start wellbutrin SR 100mg  once daily in the morning mood medicine which may help energy levels.  Let us know how you tolerate it.  Return in 3 months for physical/wellness visit

## 2023-01-24 NOTE — Assessment & Plan Note (Signed)
Anticipate dementia limits sensitivity of PHQ9/GAD7.  According to husband, significant depressed symptoms present, predominant fatigue/low energy and increased appetite/weight. 8 lb weight gain over the past 6 months noted.  Will start wellbutrin SR 100mg  daily in am.  Reassess at CPE in 3 months. Pt/husband agree with plan.

## 2023-01-24 NOTE — Assessment & Plan Note (Signed)
H/o mild dementia on aricept and namenda followed by neurology. This complicates care.  Husband notes worsening memory.  Recommend stop driving alone until re-evaluated by neurology.

## 2023-02-07 ENCOUNTER — Encounter: Payer: Self-pay | Admitting: Nurse Practitioner

## 2023-02-07 ENCOUNTER — Ambulatory Visit (INDEPENDENT_AMBULATORY_CARE_PROVIDER_SITE_OTHER): Payer: PPO | Admitting: Nurse Practitioner

## 2023-02-07 VITALS — BP 136/68 | HR 58 | Temp 97.8°F | Ht 63.0 in | Wt 151.2 lb

## 2023-02-07 DIAGNOSIS — R300 Dysuria: Secondary | ICD-10-CM

## 2023-02-07 LAB — URINALYSIS, ROUTINE W REFLEX MICROSCOPIC
Bilirubin Urine: NEGATIVE
Ketones, ur: NEGATIVE
Nitrite: NEGATIVE
Specific Gravity, Urine: 1.02 (ref 1.000–1.030)
Total Protein, Urine: 30 — AB
Urine Glucose: NEGATIVE
Urobilinogen, UA: 0.2 (ref 0.0–1.0)
pH: 5.5 (ref 5.0–8.0)

## 2023-02-07 LAB — POC URINALSYSI DIPSTICK (AUTOMATED)
Bilirubin, UA: NEGATIVE
Glucose, UA: NEGATIVE
Ketones, UA: NEGATIVE
Nitrite, UA: NEGATIVE
Protein, UA: POSITIVE — AB
Spec Grav, UA: 1.02 (ref 1.010–1.025)
Urobilinogen, UA: 0.2 E.U./dL
pH, UA: 5.5 (ref 5.0–8.0)

## 2023-02-07 MED ORDER — CEPHALEXIN 500 MG PO CAPS: 500.0000 mg | ORAL_CAPSULE | Freq: Two times a day (BID) | ORAL | 0 refills | Status: AC

## 2023-02-07 NOTE — Patient Instructions (Signed)
Increase fluid intake. Rx sent to pharmacy. Will send urine for culture and will let you know about the results.

## 2023-02-07 NOTE — Progress Notes (Unsigned)
Established Patient Office Visit  Subjective:  Patient ID: Kathy Baldwin, female    DOB: [birth year]  Age: 77 y.o. MRN: [id number]  CC:  Chief Complaint  Patient presents with   Dysuria    HPI  [name] presents for:  Dysuria  The current episode started in the past 7 days. The problem occurs every urination. The problem has been waxing and waning. The quality of the pain is described as burning. The patient is experiencing no pain. There has been no fever. Associated symptoms include frequency. Pertinent negatives include no hematuria or urgency. She has tried increased fluids for the symptoms.     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 Holly Bodily 2010   DNR no code (do not resuscitate) 03/20/2017   GERD (gastroesophageal reflux disease)    Hearing loss    History of gallstones    Hyperlipidemia    Hypertension    Hypertrophy of nasal turbinates    Leg mass, left    in calf muscle seeing surgeon tomorrow regarding   Lymphocytosis    Menopause age 65   Multiple food allergies    chocolate and cabbage   OSA on CPAP    Osteopenia Oct. 2014   Sleep apnea    Stroke (HCC) 09/11/2021   Thyroid nodule    Vertigo    Vitamin D deficiency disease     Past Surgical History:  Procedure Laterality Date   BREAST CYST ASPIRATION Right    negative   CARPAL TUNNEL RELEASE Right 2015   CATARACT EXTRACTION W/PHACO Left 09/30/2018   Procedure: CATARACT EXTRACTION PHACO AND INTRAOCULAR LENS PLACEMENT (IOC)  LEFT;  Surgeon: Lockie Mola, MD;  Location: [facility];  Service: Ophthalmology;  Laterality: Left;  sleep apnea   CATARACT EXTRACTION W/PHACO Right 10/28/2018   Procedure: CATARACT EXTRACTION PHACO  AND INTRAOCULAR LENS PLACEMENT (IOC)  RIGHT;  Surgeon: Lockie Mola, MD;  Location: [facility];  Service: Ophthalmology;  Laterality: Right;   CHOLECYSTECTOMY  1990   COLONOSCOPY  04/25/2008   CYST EXCISION Left 2020   L popliteal/peroneal cyst excision s/p residual numbness   DILATION AND CURETTAGE OF UTERUS  1982   s/p miscarriage   ESOPHAGOGASTRODUODENOSCOPY  08/2020   chronic gastritis, neg H pylori, no Barretts, no rpt needed [facility])    Family History  Problem Relation Age of Onset   Healthy Mother    Arthritis Mother    Lung disease Father        black lung   Heart disease Father    Hyperlipidemia Father    Hypertension Father    Emphysema Sister    Healthy Sister    Cancer Brother        unsure of type   Asthma Daughter    Heart disease Sister    Thyroid disease Sister    Stroke Maternal Grandmother    Hypertension Maternal Grandmother    Diabetes Maternal Grandmother    Diabetes Sister    Healthy Sister    Breast cancer Neg Hx     Social History   Socioeconomic History   Marital status: Married    Spouse name: [name]   Number of children:  1   Years of education: Not on file   Highest education level: Bachelor's degree (e.g., BA, AB, BS)  Occupational History   Occupation: Retired  Tobacco Use   Smoking status: Never   Smokeless tobacco: Never   Tobacco comments:    Never smoked, so cannot quit...  Vaping Use   Vaping Use: Never used  Substance and Sexual Activity   Alcohol use: Not Currently    Alcohol/week: 0.0 standard drinks of alcohol    Comment: Occasional celebratory drink (holidays, etc.) only.   Drug use: No   Sexual activity: Yes    Partners: Male    Birth control/protection: Post-menopausal  Other Topics Concern   Not on file  Social History Narrative   Lives with husband Kathy Baldwin   Occ: retired Diplomatic Services operational officer   Edu: BA   Act:    Diet:    Social Determinants of Corporate investment banker Strain: Low Risk   (03/13/2022)   Overall Financial Resource Strain (CARDIA)    Difficulty of Paying Living Expenses: Not hard at all  Food Insecurity: No Food Insecurity (03/13/2022)   Hunger Vital Sign    Worried About Running Out of Food in the Last Year: Never true    Ran Out of Food in the Last Year: Never true  Transportation Needs: No Transportation Needs (03/13/2022)   PRAPARE - Administrator, Civil Service (Medical): No    Lack of Transportation (Non-Medical): No  Physical Activity: Inactive (03/13/2022)   Exercise Vital Sign    Days of Exercise per Week: 0 days    Minutes of Exercise per Session: 0 min  Stress: No Stress Concern Present (03/13/2022)   [employer] of Occupational Health - Occupational Stress Questionnaire    Feeling of Stress : Not at all  Social Connections: Moderately Isolated (03/13/2022)   Social Connection and Isolation Panel [NHANES]    Frequency of Communication with Friends and Family: More than three times a week    Frequency of Social Gatherings with Friends and Family: More than three times a week    Attends Religious Services: Never    Database administrator or Organizations: No    Attends Banker Meetings: Never    Marital Status: Married  Catering manager Violence: Not At Risk (03/13/2022)   Humiliation, Afraid, Rape, and Kick questionnaire    Fear of Current or Ex-Partner: No    Emotionally Abused: No    Physically Abused: No    Sexually Abused: No     Outpatient Medications Prior to Visit  Medication Sig Dispense Refill   amLODipine (NORVASC) 5 MG tablet TAKE 1 TABLET BY MOUTH ONCE DAILY 90 tablet 0   aspirin EC 81 MG EC tablet Take 1 tablet (81 mg total) by mouth daily. Swallow whole. 21 tablet 0   buPROPion ER (WELLBUTRIN SR) 100 MG 12 hr tablet Take 1 tablet (100 mg total) by mouth daily. 30 tablet 3   Calcium Carb-Cholecalciferol (CALCIUM 600+D) 600-800 MG-UNIT TABS Take by mouth daily. 2 capsule daily     clopidogrel (PLAVIX) 75 MG  tablet Take 1 tablet (75 mg total) by mouth daily. 90 tablet 0   COLLAGEN PO Take by mouth.     Cyanocobalamin (B-12) 2500 MCG TABS Take 1 tablet by mouth daily. Pt taking 1/2 of a 5000 mcg tablet     donepezil (ARICEPT) 10 MG tablet Take 10 mg by mouth at bedtime.      famotidine (PEPCID) 20  MG tablet Take 1 tablet (20 mg total) by mouth 2 (two) times daily. For heartburn/indigestion 180 tablet 3   FIBER PO Take by mouth.     fluticasone (FLONASE) 50 MCG/ACT nasal spray instill TWO SPRAYS in each nostril daily (shake gently) 48 g 3   gabapentin (NEURONTIN) 100 MG capsule TAKE 1 CAPSULE BY MOUTH TWICE DAILY AS NEEDED FOR LEFT LEG PAIN 60 capsule 1   meclizine (ANTIVERT) 25 MG tablet Take 1 tablet (25 mg total) by mouth 3 (three) times daily as needed for dizziness. 30 tablet 0   memantine (NAMENDA) 10 MG tablet Take 10 mg by mouth 2 (two) times daily.     Multiple Vitamin (MULTIVITAMIN) tablet Take 1 tablet by mouth daily.     MYRBETRIQ 25 MG TB24 tablet TAKE ONE TABLET BY MOUTH ONCE DAILY 90 tablet 3   pantoprazole (PROTONIX) 40 MG tablet TAKE ONE TABLET BY MOUTH ONCE DAILY FOR gerd symptoms 90 tablet 2   PEDIASURE/FIBER (PEDIASURE/FIBER) LIQD Take 10 mLs by mouth.     polyethylene glycol (MIRALAX / GLYCOLAX) 17 g packet Take 8.5-17 g by mouth daily as needed.     PREMARIN vaginal cream USE A pea-sized AMOUNT externally AND internally twice A WEEK AT night 30 g 1   rosuvastatin (CRESTOR) 10 MG tablet TAKE ONE TABLET BY MOUTH EVERYDAY AT BEDTIME 90 tablet 3   WIXELA INHUB 250-50 MCG/ACT AEPB INHALE 1 PUFF BY MOUTH INTO LUNGS in THE morning AND AT bedtime 60 each 3   No facility-administered medications prior to visit.    Allergies  Allergen Reactions   Cabbage Other (See Comments)    Hypersensitivity on allergy testing   Chocolate Other (See Comments)    Hypersensitivity on allergy testing   Statins Other (See Comments)    Zocor and Pravastatin--leg cramps Other reaction(s): Other (See  Comments) Zocor and Pravastatin--leg cramps    ROS Review of Systems  Genitourinary:  Positive for dysuria and frequency. Negative for hematuria and urgency.      Objective:    Physical Exam Constitutional:      Appearance: Normal appearance.  Cardiovascular:     Rate and Rhythm: Normal rate and regular rhythm.     Pulses: Normal pulses.     Heart sounds: Normal heart sounds. No murmur heard. Pulmonary:     Effort: Pulmonary effort is normal.     Breath sounds: No stridor. No wheezing.  Abdominal:     Tenderness: There is no abdominal tenderness. There is no right CVA tenderness or left CVA tenderness.  Skin:    Capillary Refill: Capillary refill takes less than 2 seconds.  Neurological:     General: No focal deficit present.     Mental Status: She is alert and oriented to person, place, and time. Mental status is at baseline.  Psychiatric:        Mood and Affect: Mood normal.        Behavior: Behavior normal.        Thought Content: Thought content normal.        Judgment: Judgment normal.     BP 136/68   Pulse (!) 58   Temp 97.8 F (36.6 C)   Ht 5\' 3"  (1.6 m)   Wt 151 lb 3.2 oz (68.6 kg)   SpO2 97%   BMI 26.78 kg/m  Wt Readings from Last 3 Encounters:  02/07/23 151 lb 3.2 oz (68.6 kg)  01/24/23 151 lb 2 oz (68.5 kg)  09/24/22 150 lb  2 oz (68.1 kg)     Health Maintenance  Topic Date Due   COVID-19 Vaccine (5 - 2023-24 season) 05/10/2022   DTaP/Tdap/Td (4 - Td or Tdap) 08/13/2022   Medicare Annual Wellness (AWV)  03/14/2023   MAMMOGRAM  03/19/2023   INFLUENZA VACCINE  04/10/2023   Pneumonia Vaccine 50+ Years old  Completed   DEXA SCAN  Completed   Hepatitis C Screening  Completed   Zoster Vaccines- Shingrix  Completed   HPV VACCINES  Aged Out   Fecal DNA (Cologuard)  Discontinued    There are no preventive care reminders to display for this patient.  Lab Results  Component Value Date   TSH 0.51 11/06/2021   Lab Results  Component Value Date    WBC 5.3 11/06/2021   HGB 12.8 11/06/2021   HCT 39.2 11/06/2021   MCV 93.1 11/06/2021   PLT 210.0 11/06/2021   Lab Results  Component Value Date   NA 142 04/08/2022   K 3.9 04/08/2022   CO2 29 04/08/2022   GLUCOSE 96 04/08/2022   BUN 17 04/08/2022   CREATININE 0.97 04/08/2022   BILITOT 0.8 04/08/2022   ALKPHOS 67 04/08/2022   AST 31 04/08/2022   ALT 18 04/08/2022   PROT 7.3 04/08/2022   ALBUMIN 4.4 04/08/2022   CALCIUM 9.8 04/08/2022   ANIONGAP 7 09/13/2021   GFR 56.31 (L) 04/08/2022   Lab Results  Component Value Date   CHOL 170 04/08/2022   Lab Results  Component Value Date   HDL 84.90 04/08/2022   Lab Results  Component Value Date   LDLCALC 72 04/08/2022   Lab Results  Component Value Date   TRIG 65.0 04/08/2022   Lab Results  Component Value Date   CHOLHDL 2 04/08/2022   Lab Results  Component Value Date   HGBA1C 5.6 09/11/2021      Assessment & Plan:  Dysuria Assessment & Plan: POCT urinalysis positive for  leukocytes, moderate blood and protein Will treat with cephalexin. Urine culture pending. Advised patient to increase fluid intake. Advised to take over-the-counter Azo for pain.   Orders: -     POCT Urinalysis Dipstick (Automated) -     Urinalysis, Routine w reflex microscopic -     Urine Culture  Other orders -     Cephalexin; Take 1 capsule (500 mg total) by mouth 2 (two) times daily for 7 days.  Dispense: 14 capsule; Refill: 0    Follow-up: No follow-ups on file.   Kara Dies, NP

## 2023-02-09 ENCOUNTER — Encounter: Payer: Self-pay | Admitting: Nurse Practitioner

## 2023-02-09 DIAGNOSIS — R300 Dysuria: Secondary | ICD-10-CM | POA: Insufficient documentation

## 2023-02-09 NOTE — Assessment & Plan Note (Signed)
POCT urinalysis positive for  leukocytes, moderate blood and protein Will treat with cephalexin. Urine culture pending. Advised patient to increase fluid intake. Advised to take over-the-counter Azo for pain.

## 2023-02-10 LAB — URINE CULTURE
MICRO NUMBER:: 15026056
SPECIMEN QUALITY:: ADEQUATE

## 2023-02-10 NOTE — Progress Notes (Signed)
Please inform the patient the urine culture is positive for E.coli and is treated well with the antibiotic she is taking.

## 2023-02-18 ENCOUNTER — Other Ambulatory Visit: Payer: Self-pay | Admitting: Family Medicine

## 2023-02-18 DIAGNOSIS — Z1231 Encounter for screening mammogram for malignant neoplasm of breast: Secondary | ICD-10-CM

## 2023-03-06 ENCOUNTER — Encounter: Payer: Self-pay | Admitting: Family Medicine

## 2023-03-21 ENCOUNTER — Other Ambulatory Visit: Payer: Self-pay | Admitting: Family Medicine

## 2023-03-21 DIAGNOSIS — G5732 Lesion of lateral popliteal nerve, left lower limb: Secondary | ICD-10-CM

## 2023-03-21 DIAGNOSIS — I10 Essential (primary) hypertension: Secondary | ICD-10-CM

## 2023-03-21 NOTE — Telephone Encounter (Signed)
Gabapentin Last filled:  01/17/23, #60 Last OV:  01/24/23, depression assessment Next OV:  none

## 2023-03-24 ENCOUNTER — Other Ambulatory Visit: Payer: Self-pay | Admitting: Family Medicine

## 2023-03-24 DIAGNOSIS — E782 Mixed hyperlipidemia: Secondary | ICD-10-CM

## 2023-03-24 NOTE — Telephone Encounter (Signed)
E-scribed refill  Pt has M[facility] wellness on 04/21/23. Plz schedule CPE and fasting lab (no food/drink- except water and/or blk coffee 5 hrs prior) visits after 04/21/23 to prevent delays in future refills.

## 2023-03-24 NOTE — Telephone Encounter (Signed)
Patient will callback once she get home.

## 2023-03-24 NOTE — Telephone Encounter (Signed)
 ERx 

## 2023-03-25 ENCOUNTER — Ambulatory Visit: Payer: PPO | Admitting: Primary Care

## 2023-03-25 ENCOUNTER — Ambulatory Visit
Admission: RE | Admit: 2023-03-25 | Discharge: 2023-03-25 | Disposition: A | Discharge status: Home / Self Care | Payer: PPO | Source: Ambulatory Visit | Attending: Family Medicine | Admitting: Family Medicine

## 2023-03-25 VITALS — BP 138/76 | HR 74 | Temp 97.6°F | Ht 63.0 in | Wt 150.0 lb

## 2023-03-25 DIAGNOSIS — Z1231 Encounter for screening mammogram for malignant neoplasm of breast: Secondary | ICD-10-CM | POA: Insufficient documentation

## 2023-03-25 DIAGNOSIS — R300 Dysuria: Secondary | ICD-10-CM | POA: Diagnosis not present

## 2023-03-25 LAB — POC URINALSYSI DIPSTICK (AUTOMATED)
Bilirubin, UA: NEGATIVE
Blood, UA: NEGATIVE
Glucose, UA: NEGATIVE
Ketones, UA: NEGATIVE
Nitrite, UA: NEGATIVE
Protein, UA: NEGATIVE
Spec Grav, UA: <=1.005 — AB (ref 1.010–1.025)
Urobilinogen, UA: 0.2 E.U./dL
pH, UA: 5.5 (ref 5.0–8.0)

## 2023-03-25 NOTE — Patient Instructions (Signed)
We will be in touch regarding your urine sample results.  You can try AZO medication for your symptoms.  It was a pleasure meeting you!

## 2023-03-25 NOTE — Assessment & Plan Note (Signed)
Differentials include vaginitis, cystitis, vaginal atrophy.  Urinalysis today with 2+ leuks, otherwise negative.  Will add urine microscopic and urine culture.  Discussed use of Azo OTC. Continue good water intake.  Await additional urine results.

## 2023-03-25 NOTE — Progress Notes (Signed)
Subjective:    Patient ID: Kathy Baldwin, female    DOB: 07-11-1946, 77 y.o.   MRN: [id number]  Dysuria  Associated symptoms include frequency. Pertinent negatives include no flank pain or nausea.    Kathy Baldwin is a very pleasant 77 y.o. female patient of Dr. Sharen Hones with a history of hypertension, overactive bladder, vaginal atrophy, acute cystitis who presents today to discuss dysuria.  Her husband joins [place] today.  Symptom onset three days ago with dysuria, vaginal itching, vaginal burning, increased urinary frequency.   She denies hematuria, fevers, pelvic pressure.  She has been applying Vagisil which has provided temporary improvement in her itching.  She has been drinking plenty of water daily.  She is compliant to her Myrbetriq for overactive bladder.  Symptoms are well-controlled.  She was last evaluated for dysuria on 02/07/2023.  Urinalysis was culture positive for E. coli, resistance to ampicillin. Treated with Keflex regimen and symptoms resolved.     Review of Systems  Constitutional:  Negative for fever.  Gastrointestinal:  Negative for abdominal pain and nausea.  Genitourinary:  Positive for dysuria and frequency. Negative for flank pain, pelvic pain and vaginal discharge.       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 Holly Bodily 2010   DNR no code (do not resuscitate) 03/20/2017   GERD (gastroesophageal reflux disease)    Hearing loss    History of gallstones    Hyperlipidemia    Hypertension    Hypertrophy of nasal turbinates    Leg mass, left    in calf muscle seeing surgeon tomorrow regarding   Lymphocytosis    Menopause age 20   Multiple food  allergies    chocolate and cabbage   OSA on CPAP    Osteopenia Oct. 2014   Sleep apnea    Stroke (HCC) 09/11/2021   Thyroid nodule    Vertigo    Vitamin D deficiency disease     Social History   Socioeconomic History   Marital status: Married    Spouse name: [name]   Number of children: 1   Years of education: Not on file   Highest education level: Bachelor's degree (e.g., BA, AB, BS)  Occupational History   Occupation: Retired  Tobacco Use   Smoking status: Never   Smokeless tobacco: Never   Tobacco comments:    Never smoked, so cannot quit...  Vaping Use   Vaping status: Never Used  Substance and Sexual Activity   Alcohol use: Not Currently    Alcohol/week: 0.0 standard drinks of alcohol    Comment: Occasional celebratory drink (holidays, etc.) only.   Drug use: No   Sexual activity: Yes    Partners: Male    Birth control/protection: Post-menopausal  Other Topics Concern   Not on file  Social History Narrative   Lives with husband [name]   Occ: retired Diplomatic Services operational officer   Edu: BA   Act:    Diet:    Social Determinants of Health   Financial Resource Strain: Low Risk  (03/13/2022)   Overall Financial Resource Strain (CARDIA)    Difficulty of Paying Living  Expenses: Not hard at all  Food Insecurity: No Food Insecurity (03/13/2022)   Hunger Vital Sign    Worried About Running Out of Food in the Last Year: Never true    Ran Out of Food in the Last Year: Never true  Transportation Needs: No Transportation Needs (03/13/2022)   PRAPARE - Administrator, Civil Service (Medical): No    Lack of Transportation (Non-Medical): No  Physical Activity: Inactive (03/13/2022)   Exercise Vital Sign    Days of Exercise per Week: 0 days    Minutes of Exercise per Session: 0 min  Stress: No Stress Concern Present (03/13/2022)   [employer] of Occupational Health - Occupational Stress Questionnaire    Feeling of Stress : Not at all  Social Connections: Moderately  Isolated (03/13/2022)   Social Connection and Isolation Panel [NHANES]    Frequency of Communication with Friends and Family: More than three times a week    Frequency of Social Gatherings with Friends and Family: More than three times a week    Attends Religious Services: Never    Database administrator or Organizations: No    Attends Banker Meetings: Never    Marital Status: Married  Catering manager Violence: Not At Risk (03/13/2022)   Humiliation, Afraid, Rape, and Kick questionnaire    Fear of Current or Ex-Partner: No    Emotionally Abused: No    Physically Abused: No    Sexually Abused: No    Past Surgical History:  Procedure Laterality Date   BREAST CYST ASPIRATION Right    negative   CARPAL TUNNEL RELEASE Right 2015   CATARACT EXTRACTION W/PHACO Left 09/30/2018   Procedure: CATARACT EXTRACTION PHACO AND INTRAOCULAR LENS PLACEMENT (IOC)  LEFT;  Surgeon: Lockie Mola, MD;  Location: [facility];  Service: Ophthalmology;  Laterality: Left;  sleep apnea   CATARACT EXTRACTION W/PHACO Right 10/28/2018   Procedure: CATARACT EXTRACTION PHACO AND INTRAOCULAR LENS PLACEMENT (IOC)  RIGHT;  Surgeon: Lockie Mola, MD;  Location[facility];  Service: Ophthalmology;  Laterality: Right;   CHOLECYSTECTOMY  1990   COLONOSCOPY  04/25/2008   CYST EXCISION Left 2020   L popliteal/peroneal cyst excision s/p residual numbness   DILATION AND CURETTAGE OF UTERUS  1982   s/p miscarriage   ESOPHAGOGASTRODUODENOSCOPY  08/2020   chronic gastritis, neg H pylori, no Barretts, no rpt needed [facility])    Family History  Problem Relation Age of Onset   Healthy Mother    Arthritis Mother    Lung disease Father        black lung   Heart disease Father    Hyperlipidemia Father    Hypertension Father    Emphysema Sister    Healthy Sister    Cancer Brother        unsure of type   Asthma Daughter    Heart disease Sister    Thyroid disease Sister     Stroke Maternal Grandmother    Hypertension Maternal Grandmother    Diabetes Maternal Grandmother    Diabetes Sister    Healthy Sister    Breast cancer Neg Hx     Allergies  Allergen Reactions   Cabbage Other (See Comments)    Hypersensitivity on allergy testing   Chocolate Other (See Comments)    Hypersensitivity on allergy testing   Statins Other (See Comments)    Zocor and Pravastatin--leg cramps Other reaction(s): Other (See Comments) Zocor and Pravastatin--leg cramps    Current Outpatient  Medications on File Prior to Visit  Medication Sig Dispense Refill   amLODipine (NORVASC) 5 MG tablet TAKE 1 TABLET BY MOUTH ONCE DAILY 90 tablet 0   aspirin EC 81 MG EC tablet Take 1 tablet (81 mg total) by mouth daily. Swallow whole. 21 tablet 0   buPROPion ER (WELLBUTRIN SR) 100 MG 12 hr tablet Take 1 tablet (100 mg total) by mouth daily. 30 tablet 3   Calcium Carb-Cholecalciferol (CALCIUM 600+D) 600-800 MG-UNIT TABS Take by mouth daily. 2 capsule daily     clopidogrel (PLAVIX) 75 MG tablet Take 1 tablet (75 mg total) by mouth daily. 90 tablet 0   COLLAGEN PO Take by mouth.     Cyanocobalamin (B-12) 2500 MCG TABS Take 1 tablet by mouth daily. Pt taking 1/2 of a 5000 mcg tablet     donepezil (ARICEPT) 10 MG tablet Take 10 mg by mouth at bedtime.      famotidine (PEPCID) 20 MG tablet Take 1 tablet (20 mg total) by mouth 2 (two) times daily. For heartburn/indigestion 180 tablet 3   FIBER PO Take by mouth.     fluticasone (FLONASE) 50 MCG/ACT nasal spray instill TWO SPRAYS in each nostril daily (shake gently) 48 g 3   gabapentin (NEURONTIN) 100 MG capsule TAKE 1 CAPSULE BY MOUTH TWICE DAILY AS NEEDED FOR LEFT LEG PAIN 60 capsule 3   meclizine (ANTIVERT) 25 MG tablet Take 1 tablet (25 mg total) by mouth 3 (three) times daily as needed for dizziness. 30 tablet 0   memantine (NAMENDA) 10 MG tablet Take 10 mg by mouth 2 (two) times daily.     Multiple Vitamin (MULTIVITAMIN) tablet Take 1 tablet  by mouth daily.     MYRBETRIQ 25 MG TB24 tablet TAKE ONE TABLET BY MOUTH ONCE DAILY 90 tablet 3   pantoprazole (PROTONIX) 40 MG tablet TAKE ONE TABLET BY MOUTH ONCE DAILY FOR gerd symptoms 90 tablet 2   PEDIASURE/FIBER (PEDIASURE/FIBER) LIQD Take 10 mLs by mouth.     polyethylene glycol (MIRALAX / GLYCOLAX) 17 g packet Take 8.5-17 g by mouth daily as needed.     PREMARIN vaginal cream USE A pea-sized AMOUNT externally AND internally twice A WEEK AT night 30 g 1   rosuvastatin (CRESTOR) 10 MG tablet TAKE 1 TABLET BY MOUTH AT BEDTIME 90 tablet 0   WIXELA INHUB 250-50 MCG/ACT AEPB INHALE 1 PUFF BY MOUTH INTO LUNGS in THE morning AND AT bedtime 60 each 3   No current facility-administered medications on file prior to visit.    BP 138/76   Pulse 74   Temp 97.6 F (36.4 C) (Temporal)   Ht 5\' 3"  (1.6 m)   Wt 150 lb (68 kg)   SpO2 98%   BMI 26.57 kg/m  Objective:   Physical Exam Cardiovascular:     Rate and Rhythm: Normal rate and regular rhythm.  Pulmonary:     Effort: Pulmonary effort is normal.  Abdominal:     Palpations: Abdomen is soft.     Tenderness: There is no abdominal tenderness. There is no right CVA tenderness or left CVA tenderness.  Musculoskeletal:     Cervical back: Neck supple.  Skin:    General: Skin is warm and dry.           Assessment & Plan:  Dysuria Assessment & Plan: Differentials include vaginitis, cystitis, vaginal atrophy.  Urinalysis today with 2+ leuks, otherwise negative.  Will add urine microscopic and urine culture.  Discussed use of Azo  OTC. Continue good water intake.  Await additional urine results.  Orders: -     POCT Urinalysis Dipstick (Automated) -     Urine Microscopic -     Urine Culture        Doreene Nest, NP

## 2023-03-26 ENCOUNTER — Other Ambulatory Visit: Payer: Self-pay | Admitting: Primary Care

## 2023-03-26 DIAGNOSIS — N3000 Acute cystitis without hematuria: Secondary | ICD-10-CM

## 2023-03-26 LAB — URINALYSIS, MICROSCOPIC ONLY

## 2023-03-26 MED ORDER — SULFAMETHOXAZOLE-TRIMETHOPRIM 800-160 MG PO TABS
1.0000 | ORAL_TABLET | Freq: Two times a day (BID) | ORAL | 0 refills | Status: DC
Start: 2023-03-26 — End: 2023-05-01

## 2023-03-27 LAB — URINE CULTURE
MICRO NUMBER:: 15206376
SPECIMEN QUALITY:: ADEQUATE

## 2023-04-07 ENCOUNTER — Encounter: Payer: Self-pay | Admitting: Primary Care

## 2023-04-07 ENCOUNTER — Ambulatory Visit: Payer: PPO | Admitting: Primary Care

## 2023-04-07 VITALS — BP 110/56 | HR 71 | Temp 97.8°F | Ht 63.0 in | Wt 147.0 lb

## 2023-04-07 DIAGNOSIS — G4733 Obstructive sleep apnea (adult) (pediatric): Secondary | ICD-10-CM | POA: Diagnosis not present

## 2023-04-07 NOTE — Progress Notes (Signed)
@Patient  ID: Fenton Malling, female    DOB: [birth year], 77 y.o.   MRN: [id number]  Chief Complaint  Patient presents with   Follow-up    No current sx.     Referring provider: [name], MD  HPI: 77 year old female, never smoked.  Past medical history significant for OSA on CPAP, asthma/ small airway disease, allergic rhinitis, hypertension, carotid stenosis, aortic arthrosclerosis, thyroid nodule, GERD, mild dementia. Patient of Dr. Jayme Cloud, last seen on 06/1022.   04/07/2023- INTERIM  Patient presents today for 42-month follow-up asthma/OSA. She is doing well. No acute complaints. No recent respiratory infections. Stress can cause her to get short winded. She had left hand surgery a couple of days ago. Taking hydrocodone as needed for pain. Maintained on Wixela 250-7mcg twice daily. She has not needed to use her Albuterol rescue inhaler. Using CPAP nightly. Tolerating well. Pressure 6-10cm h20.   Airview download/20 6/24 - 04/03/2023 Usage days 30/30 days (100%); 29 days (97%) greater than 4 hours Average usage 8 hours 51 minutes Pressure 6 to 10 cm H2O Air leaks 14.4 L/min (95%) AHI 0.2 Allergies  Allergen Reactions   Cabbage Other (See Comments)    Hypersensitivity on allergy testing   Chocolate Other (See Comments)    Hypersensitivity on allergy testing   Statins Other (See Comments)    Zocor and Pravastatin--leg cramps Other reaction(s): Other (See Comments) Zocor and Pravastatin--leg cramps    Immunization History  Administered Date(s) Administered   Fluad Quad(high Dose 65+) 05/12/2020, 06/08/2021   Influenza, High Dose Seasonal PF 06/27/2017, 05/21/2018   Influenza, Quadrivalent, Recombinant, Inj, Pf 07/05/2019   Influenza, Seasonal, Injecte, Preservative Fre 05/25/2015   Influenza-Unspecified 06/12/2011, 05/10/2014, 06/22/2016, 06/27/2017   Moderna Covid-19 Vaccine Bivalent Booster 70yrs & up 06/14/2021   Moderna SARS-COV2 Booster Vaccination  01/26/2021   Moderna Sars-Covid-2 Vaccination 10/22/2019, 11/19/2019, 07/03/2020   Pneumococcal Conjugate-13 11/30/2014   Pneumococcal Polysaccharide-23 08/13/2011, 10/01/2013   Td 06/09/2002   Tdap 09/10/2011, 08/13/2012   Zoster Recombinant(Shingrix) 04/27/2018, 06/29/2018   Zoster, Live 08/13/2012    Past Medical History:  Diagnosis Date   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 Holly Bodily 2010   DNR no code (do not resuscitate) 03/20/2017   GERD (gastroesophageal reflux disease)    Hearing loss    History of gallstones    Hyperlipidemia    Hypertension    Hypertrophy of nasal turbinates    Leg mass, left    in calf muscle seeing surgeon tomorrow regarding   Lymphocytosis    Menopause age 62   Multiple food allergies    chocolate and cabbage   OSA on CPAP    Osteopenia Oct. 2014   Sleep apnea    Stroke (HCC) 09/11/2021   Thyroid nodule    Vertigo    Vitamin D deficiency disease     Tobacco History: Social History   Tobacco Use  Smoking Status Never  Smokeless Tobacco Never  Tobacco Comments   Never smoked, so cannot quit...   Counseling given: Not Answered Tobacco comments: Never smoked, so cannot quit...   Outpatient Medications Prior to Visit  Medication Sig Dispense Refill   amLODipine (NORVASC) 5 MG tablet TAKE 1 TABLET BY MOUTH ONCE DAILY 90 tablet 0  aspirin EC 81 MG EC tablet Take 1 tablet (81 mg total) by mouth daily. Swallow whole. 21 tablet 0   buPROPion ER (WELLBUTRIN SR) 100 MG 12 hr tablet Take 1 tablet (100 mg total) by mouth daily. 30 tablet 3   Calcium Carb-Cholecalciferol (CALCIUM 600+D) 600-800 MG-UNIT TABS Take by mouth daily. 2 capsule daily     clopidogrel (PLAVIX) 75 MG tablet Take 1 tablet (75 mg total) by mouth daily. 90 tablet  0   COLLAGEN PO Take by mouth.     Cyanocobalamin (B-12) 2500 MCG TABS Take 1 tablet by mouth daily. Pt taking 1/2 of a 5000 mcg tablet     donepezil (ARICEPT) 10 MG tablet Take 10 mg by mouth at bedtime.      famotidine (PEPCID) 20 MG tablet Take 1 tablet (20 mg total) by mouth 2 (two) times daily. For heartburn/indigestion 180 tablet 3   FIBER PO Take by mouth.     fluticasone (FLONASE) 50 MCG/ACT nasal spray instill TWO SPRAYS in each nostril daily (shake gently) 48 g 3   gabapentin (NEURONTIN) 100 MG capsule TAKE 1 CAPSULE BY MOUTH TWICE DAILY AS NEEDED FOR LEFT LEG PAIN 60 capsule 3   meclizine (ANTIVERT) 25 MG tablet Take 1 tablet (25 mg total) by mouth 3 (three) times daily as needed for dizziness. 30 tablet 0   memantine (NAMENDA) 10 MG tablet Take 10 mg by mouth 2 (two) times daily.     Multiple Vitamin (MULTIVITAMIN) tablet Take 1 tablet by mouth daily.     MYRBETRIQ 25 MG TB24 tablet TAKE ONE TABLET BY MOUTH ONCE DAILY 90 tablet 3   pantoprazole (PROTONIX) 40 MG tablet TAKE ONE TABLET BY MOUTH ONCE DAILY FOR gerd symptoms 90 tablet 2   PEDIASURE/FIBER (PEDIASURE/FIBER) LIQD Take 10 mLs by mouth.     polyethylene glycol (MIRALAX / GLYCOLAX) 17 g packet Take 8.5-17 g by mouth daily as needed.     PREMARIN vaginal cream USE A pea-sized AMOUNT externally AND internally twice A WEEK AT night 30 g 1   rosuvastatin (CRESTOR) 10 MG tablet TAKE 1 TABLET BY MOUTH AT BEDTIME 90 tablet 0   WIXELA INHUB 250-50 MCG/ACT AEPB INHALE 1 PUFF BY MOUTH INTO LUNGS in THE morning AND AT bedtime 60 each 3   HYDROcodone-acetaminophen (NORCO/VICODIN) 5-325 MG tablet Take 1 tablet by mouth every 6 (six) hours as needed. (Patient not taking: Reported on 04/07/2023)     sulfamethoxazole-trimethoprim (BACTRIM DS) 800-160 MG tablet Take 1 tablet by mouth 2 (two) times daily. For urinary tract infection. (Patient not taking: Reported on 04/07/2023) 10 tablet 0   No facility-administered medications prior to visit.    Review of Systems  Review of Systems  Constitutional: Negative.   Respiratory: Negative.    Cardiovascular: Negative.    Physical Exam  BP (!) 110/56 (BP Location: Left Arm, Cuff Size: Normal)   Pulse 71   Temp 97.8 F (36.6 C) (Temporal)   Ht 5\' 3"  (1.6 m)   Wt 147 lb (66.7 kg)   SpO2 94%   BMI 26.04 kg/m  Physical Exam Constitutional:      Appearance: Normal appearance.  HENT:     Head: Normocephalic and atraumatic.     Mouth/Throat:     Mouth: Mucous membranes are moist.  Cardiovascular:     Rate and Rhythm: Normal rate and regular rhythm.  Pulmonary:     Effort: Pulmonary effort is normal.     Breath sounds: No wheezing, rhonchi  or rales.  Musculoskeletal:        General: Normal range of motion.  Skin:    General: Skin is warm and dry.  Neurological:     General: No focal deficit present.     Mental Status: She is alert and oriented to person, place, and time. Mental status is at baseline.  Psychiatric:        Mood and Affect: Mood normal.        Behavior: Behavior normal.        Thought Content: Thought content normal.        Judgment: Judgment normal.      Lab Results:  CBC    Component Value Date/Time   WBC 5.3 11/06/2021 1015   RBC 4.21 11/06/2021 1015   HGB 12.8 11/06/2021 1015   HGB 14.2 05/02/2015 0819   HCT 39.2 11/06/2021 1015   HCT 41.9 05/02/2015 0819   PLT 210.0 11/06/2021 1015   PLT 267 05/02/2015 0819   MCV 93.1 11/06/2021 1015   MCV 89 05/02/2015 0819   MCH 31.6 09/13/2021 0617   MCHC 32.7 11/06/2021 1015   RDW 13.5 11/06/2021 1015   RDW 13.8 05/02/2015 0819   LYMPHSABS 2.0 11/06/2021 1015   LYMPHSABS 3.6 (H) 05/02/2015 0819   MONOABS 0.5 11/06/2021 1015   EOSABS 0.2 11/06/2021 1015   EOSABS 0.2 05/02/2015 0819   BASOSABS 0.0 11/06/2021 1015   BASOSABS 0.0 05/02/2015 0819    BMET    Component Value Date/Time   NA 142 04/08/2022 0859   NA 143 12/04/2015 0851   K 3.9 04/08/2022 0859   CL 104 04/08/2022 0859   CO2 29  04/08/2022 0859   GLUCOSE 96 04/08/2022 0859   BUN 17 04/08/2022 0859   BUN 11 12/04/2015 0851   CREATININE 0.97 04/08/2022 0859   CREATININE 0.78 10/08/2018 1156   CALCIUM 9.8 04/08/2022 0859   GFRNONAA >60 09/13/2021 0617   GFRNONAA 75 10/08/2018 1156   GFRAA 87 10/08/2018 1156    BNP No results found for: "BNP"  ProBNP No results found for: "PROBNP"  Imaging: MM 3D SCREENING MAMMOGRAM BILATERAL BREAST  Result Date: 03/26/2023 CLINICAL DATA:  Screening. EXAM: DIGITAL SCREENING BILATERAL MAMMOGRAM WITH TOMOSYNTHESIS AND CAD TECHNIQUE: Bilateral screening digital craniocaudal and mediolateral oblique mammograms were obtained. Bilateral screening digital breast tomosynthesis was performed. The images were evaluated with computer-aided detection. COMPARISON:  Previous exam(s). ACR Breast Density Category c: The breasts are heterogeneously dense, which may obscure small masses. FINDINGS: There are no findings suspicious for malignancy. IMPRESSION: No mammographic evidence of malignancy. A result letter of this screening mammogram will be mailed directly to the patient. RECOMMENDATION: Screening mammogram in one year. (Code:SM-B-01Y) BI-RADS CATEGORY  1: Negative. Electronically Signed   By: Ted Mcalpine M.D.   On: 03/26/2023 09:29     Assessment & Plan:   Asthma - Stable; No acute respiratory complaints or recent exacerbations. No SABA use - Continue Wixela 250-81mcg one puffs q 12 hours   Allergic rhinitis - Continue fluticasone nasal spray daily prn allergy symptoms   OSA on CPAP - Well controlled on auto CPAP - Patient is 97% compliant with use greater than 4 hours over the last 30 days - Current pressure 6-10cm h20; Residual AHI 0.2/hour   Glenford Bayley, NP 04/07/2023

## 2023-04-07 NOTE — Assessment & Plan Note (Signed)
-   Well controlled on auto CPAP - Patient is 97% compliant with use greater than 4 hours over the last 30 days - Current pressure 6-10cm h20; Residual AHI 0.2/hour

## 2023-04-07 NOTE — Assessment & Plan Note (Addendum)
-   Stable; No acute respiratory complaints or recent exacerbations. No SABA use - Continue Wixela 250-70mcg one puffs q 12 hours

## 2023-04-07 NOTE — Assessment & Plan Note (Signed)
-   Continue fluticasone nasal spray daily prn allergy symptoms

## 2023-04-07 NOTE — Patient Instructions (Addendum)
He looked well today Kathy Baldwin No changes to plan of care Sleep apnea is well-controlled on current pressure settings  Recommendations Continue Wixela 1 puff morning and bedtime Use albuterol rescue inhaler 2 puffs every 4-6 hours as needed for breakthrough shortness of breath or wheezing Continue her CPAP nightly 4 to 6 hours or longer  Follow-up 6 months Dr. Jayme Cloud or sooner if needed

## 2023-04-09 NOTE — Progress Notes (Signed)
Agree with the details of the visit as noted by Elizabeth Walsh, NP.  C. Laura Gonzalez, MD [facility] 

## 2023-04-15 ENCOUNTER — Other Ambulatory Visit: Payer: Self-pay | Admitting: Family Medicine

## 2023-04-15 NOTE — Telephone Encounter (Signed)
Prescription Request  04/15/2023  LOV: 01/24/2023  What is the name of the medication or equipment? PREMARIN vaginal cream   Have you contacted your pharmacy to request a refill? Yes   Which pharmacy would you like this sent to?  [facility] [place], [place] - [address] [place] [address] Phone: [phone] Fax: [phone]    Patient notified that their request is being sent to the clinical staff for review and that they should receive a response within 2 business days.   Please advise at Mobile [phone] (mobile)

## 2023-04-15 NOTE — Telephone Encounter (Signed)
Premarin crm Last rx:  05/22/22, #30 g Last OV:  01/24/23, depression Next OV:  none

## 2023-04-17 ENCOUNTER — Other Ambulatory Visit: Payer: Self-pay | Admitting: Family Medicine

## 2023-04-17 DIAGNOSIS — F320 Major depressive disorder, single episode, mild: Secondary | ICD-10-CM

## 2023-04-17 MED ORDER — PREMARIN 0.625 MG/GM VA CREA: TOPICAL_CREAM | VAGINAL | 1 refills | Status: DC

## 2023-04-18 ENCOUNTER — Telehealth: Payer: Self-pay | Admitting: Family Medicine

## 2023-04-18 NOTE — Telephone Encounter (Signed)
Refill sent in. (See 04/17/23 refill note.)

## 2023-04-18 NOTE — Telephone Encounter (Signed)
Prescription Request  04/18/2023  LOV: 01/24/2023  What is the name of the medication or equipment? buPROPion ER (WELLBUTRIN SR) 100 MG 12 hr tablet   Have you contacted your pharmacy to request a refill? Yes   Which pharmacy would you like this sent to?  [facility] - [place], [place] - [address] [place] [address] Phone: [phone] Fax: [phone]    Patient notified that their request is being sent to the clinical staff for review and that they should receive a response within 2 business days.   Please advise at Mobile [phone] (mobile)  Patient asked if this could be refilled as a 90 day supply if at all possible

## 2023-04-18 NOTE — Telephone Encounter (Signed)
Patient will call back and schedule

## 2023-04-18 NOTE — Telephone Encounter (Signed)
E-scribed refill.  Plz schedule CPE and fasting lab (no food/drink- except water and/or blk coffee 5 hrs prior) visits to prevent delays in future refills.  

## 2023-05-01 ENCOUNTER — Ambulatory Visit (INDEPENDENT_AMBULATORY_CARE_PROVIDER_SITE_OTHER): Payer: PPO

## 2023-05-01 VITALS — Wt 147.0 lb

## 2023-05-01 DIAGNOSIS — Z0000 Encounter for general adult medical examination without abnormal findings: Secondary | ICD-10-CM

## 2023-05-01 NOTE — Patient Instructions (Signed)
Kathy Baldwin , Thank you for taking time to come for your Medicare Wellness Visit. I appreciate your ongoing commitment to your health goals. Please review the following plan we discussed and let me know if I can assist you in the future.   Referrals/Orders/Follow-Ups/Clinician Recommendations: increase walking   This is a list of the screening recommended for you and due dates:  Health Maintenance  Topic Date Due   COVID-19 Vaccine (5 - 2023-24 season) 05/10/2022   DTaP/Tdap/Td vaccine (4 - Td or Tdap) 08/13/2022   Flu Shot  04/10/2023   Mammogram  03/24/2024   Medicare Annual Wellness Visit  04/30/2024   Pneumonia Vaccine  Completed   DEXA scan (bone density measurement)  Completed   Hepatitis C Screening  Completed   Zoster (Shingles) Vaccine  Completed   HPV Vaccine  Aged Out   Cologuard (Stool DNA test)  Discontinued    Advanced directives: (Copy Requested) Please bring a copy of your health care power of attorney and living will to the office to be added to your chart at your convenience.  Next Medicare Annual Wellness Visit scheduled for next year: Yes

## 2023-05-01 NOTE — Progress Notes (Signed)
Subjective:   Kathy Baldwin is a 77 y.o. female who presents for Medicare Annual (Subsequent) preventive examination.  Visit Complete: Virtual  I connected with  [name] Salsberry on 05/01/23 by a audio enabled telemedicine application and verified that I am speaking with the correct person using two identifiers.  Patient Location: Home  Provider Location: Office/Clinic  I discussed the limitations of evaluation and management by telemedicine. The patient expressed understanding and agreed to proceed.   Vital Signs: Unable to obtain new vitals due to this being a telehealth visit.   Review of Systems     Cardiac Risk Factors include: advanced age (>35men, >37 women);hypertension;dyslipidemia     Objective:    Today's Vitals   05/01/23 1430  Weight: 147 lb (66.7 kg)   Body mass index is 26.04 kg/m.     05/01/2023    2:36 PM 03/13/2022   10:07 AM 09/11/2021    7:02 PM 09/11/2021    7:00 PM 04/20/2019    2:45 PM 10/28/2018    8:48 AM 09/30/2018    7:39 AM  Advanced Directives  Does Patient Have a Medical Advance Directive? Yes Yes  No Yes Yes Yes  Type of Estate agent of [place];Living will Healthcare Power of [place];Living will   Healthcare Power of [place];Living will Healthcare Power of [place];Living will Healthcare Power of [place];Living will  Does patient want to make changes to medical advance directive?  No - Patient declined    No - Patient declined   Copy of Healthcare Power of Attorney in Chart? No - copy requested No - copy requested   No - copy requested No - copy requested No - copy requested  Would patient like information on creating a medical advance directive?   No - Patient declined        Current Medications (verified) Outpatient Encounter Medications as of 05/01/2023  Medication Sig   amLODipine (NORVASC) 5 MG tablet TAKE 1 TABLET BY MOUTH ONCE DAILY   aspirin EC 81 MG EC tablet Take 1 tablet (81 mg total) by mouth  daily. Swallow whole.   buPROPion ER (WELLBUTRIN SR) 100 MG 12 hr tablet TAKE 1 TABLET BY MOUTH ONCE DAILY   Calcium Carb-Cholecalciferol (CALCIUM 600+D) 600-800 MG-UNIT TABS Take by mouth daily. 2 capsule daily   clopidogrel (PLAVIX) 75 MG tablet Take 1 tablet (75 mg total) by mouth daily.   COLLAGEN PO Take by mouth.   conjugated estrogens (PREMARIN) vaginal cream Use a pea-sized amount externally and internally twice a week at night   Cyanocobalamin (B-12) 2500 MCG TABS Take 1 tablet by mouth daily. Pt taking 1/2 of a 5000 mcg tablet   donepezil (ARICEPT) 10 MG tablet Take 10 mg by mouth at bedtime.    famotidine (PEPCID) 20 MG tablet Take 1 tablet (20 mg total) by mouth 2 (two) times daily. For heartburn/indigestion   FIBER PO Take by mouth.   fluticasone (FLONASE) 50 MCG/ACT nasal spray instill TWO SPRAYS in each nostril daily (shake gently)   gabapentin (NEURONTIN) 100 MG capsule TAKE 1 CAPSULE BY MOUTH TWICE DAILY AS NEEDED FOR LEFT LEG PAIN   meclizine (ANTIVERT) 25 MG tablet Take 1 tablet (25 mg total) by mouth 3 (three) times daily as needed for dizziness.   memantine (NAMENDA) 10 MG tablet Take 10 mg by mouth 2 (two) times daily.   Multiple Vitamin (MULTIVITAMIN) tablet Take 1 tablet by mouth daily.   MYRBETRIQ 25 MG TB24 tablet TAKE ONE TABLET  BY MOUTH ONCE DAILY   pantoprazole (PROTONIX) 40 MG tablet TAKE ONE TABLET BY MOUTH ONCE DAILY FOR gerd symptoms   PEDIASURE/FIBER (PEDIASURE/FIBER) LIQD Take 10 mLs by mouth.   polyethylene glycol (MIRALAX / GLYCOLAX) 17 g packet Take 8.5-17 g by mouth daily as needed.   rosuvastatin (CRESTOR) 10 MG tablet TAKE 1 TABLET BY MOUTH AT BEDTIME   WIXELA INHUB 250-50 MCG/ACT AEPB INHALE 1 PUFF BY MOUTH INTO LUNGS in THE morning AND AT bedtime   [DISCONTINUED] HYDROcodone-acetaminophen (NORCO/VICODIN) 5-325 MG tablet Take 1 tablet by mouth every 6 (six) hours as needed. (Patient not taking: Reported on 04/07/2023)   [DISCONTINUED]  sulfamethoxazole-trimethoprim (BACTRIM DS) 800-160 MG tablet Take 1 tablet by mouth 2 (two) times daily. For urinary tract infection. (Patient not taking: Reported on 04/07/2023)   No facility-administered encounter medications on file as of 05/01/2023.    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 Holly Bodily 2010   DNR no code (do not resuscitate) 03/20/2017   GERD (gastroesophageal reflux disease)    Hearing loss    History of gallstones    Hyperlipidemia    Hypertension    Hypertrophy of nasal turbinates    Leg mass, left    in calf muscle seeing surgeon tomorrow regarding   Lymphocytosis    Menopause age 89   Multiple food allergies    chocolate and cabbage   OSA on CPAP    Osteopenia Oct. 2014   Sleep apnea    Stroke (HCC) 09/11/2021   Thyroid nodule    Vertigo    Vitamin D deficiency disease    Past Surgical History:  Procedure Laterality Date   BREAST CYST ASPIRATION Right    negative   CARPAL TUNNEL RELEASE Right 2015   CATARACT EXTRACTION W/PHACO Left 09/30/2018   Procedure: CATARACT EXTRACTION PHACO AND INTRAOCULAR LENS PLACEMENT (IOC)  LEFT;  Surgeon: Lockie Mola, MD;  [facility];  Service: Ophthalmology;  Laterality: Left;  sleep apnea   CATARACT EXTRACTION W/PHACO Right 10/28/2018   Procedure: CATARACT EXTRACTION PHACO AND INTRAOCULAR LENS PLACEMENT (IOC)  RIGHT;  Surgeon: Lockie Mola, MD;  Location: [facility];  Service: Ophthalmology;  Laterality: Right;   CHOLECYSTECTOMY  1990   COLONOSCOPY  04/25/2008   CYST EXCISION Left 2020   L popliteal/peroneal cyst excision s/p residual numbness   DILATION AND  CURETTAGE OF UTERUS  1982   s/p miscarriage   ESOPHAGOGASTRODUODENOSCOPY  08/2020   chronic gastritis, neg H pylori, no Barretts, no rpt needed [facility])   Family History  Problem Relation Age of Onset   Healthy Mother    Arthritis Mother    Lung disease Father        black lung   Heart disease Father    Hyperlipidemia Father    Hypertension Father    Emphysema Sister    Healthy Sister    Cancer Brother        unsure of type   Asthma Daughter    Heart disease Sister    Thyroid disease Sister    Stroke Maternal Grandmother    Hypertension Maternal Grandmother    Diabetes Maternal  Grandmother    Diabetes Sister    Healthy Sister    Breast cancer Neg Hx    Social History   Socioeconomic History   Marital status: Married    Spouse name: [name]   Number of children: 1   Years of education: Not on file   Highest education level: Bachelor's degree (e.g., BA, AB, BS)  Occupational History   Occupation: Retired  Tobacco Use   Smoking status: Never   Smokeless tobacco: Never   Tobacco comments:    Never smoked, so cannot quit...  Vaping Use   Vaping status: Never Used  Substance and Sexual Activity   Alcohol use: Not Currently    Alcohol/week: 0.0 standard drinks of alcohol    Comment: Occasional celebratory drink (holidays, etc.) only.   Drug use: No   Sexual activity: Yes    Partners: Male    Birth control/protection: Post-menopausal  Other Topics Concern   Not on file  Social History Narrative   Lives with husband [name]   Occ: retired Diplomatic Services operational officer   Edu: BA   Act:    Diet:    Social Determinants of Corporate investment banker Strain: Low Risk  (05/01/2023)   Overall Financial Resource Strain (CARDIA)    Difficulty of Paying Living Expenses: Not hard at all  Food Insecurity: No Food Insecurity (05/01/2023)   Hunger Vital Sign    Worried About Running Out of Food in the Last Year: Never true    Ran Out of Food in the Last Year: Never true   Transportation Needs: No Transportation Needs (05/01/2023)   PRAPARE - Administrator, Civil Service (Medical): No    Lack of Transportation (Non-Medical): No  Physical Activity: Inactive (05/01/2023)   Exercise Vital Sign    Days of Exercise per Week: 0 days    Minutes of Exercise per Session: 0 min  Stress: No Stress Concern Present (05/01/2023)   [employer] of Occupational Health - Occupational Stress Questionnaire    Feeling of Stress : Not at all  Social Connections: Moderately Isolated (05/01/2023)   Social Connection and Isolation Panel [NHANES]    Frequency of Communication with Friends and Family: More than three times a week    Frequency of Social Gatherings with Friends and Family: More than three times a week    Attends Religious Services: Never    Database administrator or Organizations: No    Attends Banker Meetings: Never    Marital Status: Married    Tobacco Counseling Counseling given: Not Answered Tobacco comments: Never smoked, so cannot quit...   Clinical Intake:  Pre-visit preparation completed: Yes  Pain : No/denies pain     BMI - recorded: 26.04 Nutritional Status: BMI 25 -29 Overweight Nutritional Risks: None Diabetes: No  How often do you need to have someone help you when you read instructions, pamphlets, or other written materials from your doctor or pharmacy?: 1 - Never  Interpreter Needed?: No  Information entered by :: Lanier Ensign, LPN   Activities of Daily Living    05/01/2023    2:32 PM  In your present state of health, do you have any difficulty performing the following activities:  Hearing? 1  Comment pt wears hearing aids  Vision? 0  Difficulty concentrating or making decisions? 0  Walking or climbing stairs? 0  Dressing or bathing? 0  Doing errands, shopping? 0  Preparing Food and eating ? N  Using the Toilet? N  In the past six months, have you accidently leaked urine? N  Do you have  problems with loss of bowel control? N  Managing your Medications? N  Managing your Finances? N  Housekeeping or managing your Housekeeping? N    Patient Care Team: [name], MD as PCP - General (Family Medicine) Midge Minium, MD as Consulting Physician (Gastroenterology) [facility], [facility] (Optometry) Solum, Marlana Salvage, MD as Physician Assistant (Endocrinology) Linus Salmons, MD as Consulting Physician (Otolaryngology) Lockie Mola, MD as Referring Physician (Ophthalmology) Kathyrn Sheriff, [facility] (Inactive) as Pharmacist (Pharmacist)  Indicate any recent Medical Services you may have received from other than [facility] providers in the past year (date may be approximate).     Assessment:   This is a routine wellness examination for [place].  Hearing/Vision screen Hearing Screening - Comments:: Pt wears haring aids  Vision Screening - Comments:: Pt follows up with [facility] for exams   Dietary issues and exercise activities discussed:     Goals Addressed             This Visit's Progress    Patient Stated       Increase walking more        Depression Screen    05/01/2023    2:35 PM 03/25/2023    2:49 PM 01/24/2023   11:31 AM 09/24/2022    3:23 PM 03/13/2022   10:03 AM 03/05/2021    9:55 AM 08/17/2019   12:13 PM  PHQ 2/9 Scores  PHQ - 2 Score 0 0 2 0 0 0 0  PHQ- 9 Score 0 0 5 4  0 1    Fall Risk    05/01/2023    2:36 PM 03/25/2023    2:50 PM 01/24/2023   11:31 AM 09/24/2022    3:23 PM 03/13/2022   10:06 AM  Fall Risk   Falls in the past year? 1 0 0 0 0  Number falls in past yr: 1 0   0  Injury with Fall? 0 0   0  Risk for fall due to : Impaired vision;History of fall(s) No Fall Risks   No Fall Risks  Follow up Falls prevention discussed Falls evaluation completed       MEDICARE RISK AT HOME: Medicare Risk at Home Any stairs in or around the home?: Yes If so, are there any without handrails?: No Home free of loose throw rugs in walkways,  pet beds, electrical cords, etc?: Yes Adequate lighting in your home to reduce risk of falls?: Yes Life alert?: No Use of a cane, walker or w/c?: No Grab bars in the bathroom?: Yes Shower chair or bench in shower?: No Elevated toilet seat or a handicapped toilet?: No  TIMED UP AND GO:  Was the test performed?  no    Cognitive Function:        05/01/2023    2:37 PM 03/13/2022   10:08 AM 04/17/2018    2:21 PM 03/20/2017   11:27 AM  6CIT Screen  What Year? 0 points 0 points 0 points 0 points  What month? 0 points 0 points 0 points 0 points  What time? 0 points 0 points 0 points 0 points  Count back from 20 0 points 0 points 0 points 0 points  Months in reverse 0 points 0 points 0 points 0 points  Repeat phrase 0 points 0 points 0 points 0 points  Total Score 0 points 0 points 0 points 0 points    Immunizations  Immunization History  Administered Date(s) Administered   Fluad Quad(high Dose 65+) 05/12/2020, 06/08/2021   Influenza, High Dose Seasonal PF 06/27/2017, 05/21/2018   Influenza, Quadrivalent, Recombinant, Inj, Pf 07/05/2019   Influenza, Seasonal, Injecte, Preservative Fre 05/25/2015   Influenza-Unspecified 06/12/2011, 05/10/2014, 06/22/2016, 06/27/2017   Moderna Covid-19 Vaccine Bivalent Booster 45yrs & up 06/14/2021   Moderna SARS-COV2 Booster Vaccination 01/26/2021   Moderna Sars-Covid-2 Vaccination 10/22/2019, 11/19/2019, 07/03/2020   Pneumococcal Conjugate-13 11/30/2014   Pneumococcal Polysaccharide-23 08/13/2011, 10/01/2013   Td 06/09/2002   Tdap 09/10/2011, 08/13/2012   Zoster Recombinant(Shingrix) 04/27/2018, 06/29/2018   Zoster, Live 08/13/2012    TDAP status: Due, Education has been provided regarding the importance of this vaccine. Advised may receive this vaccine at local pharmacy or Health Dept. Aware to provide a copy of the vaccination record if obtained from local pharmacy or Health Dept. Verbalized acceptance and understanding.  Flu Vaccine status:  Due, Education has been provided regarding the importance of this vaccine. Advised may receive this vaccine at local pharmacy or Health Dept. Aware to provide a copy of the vaccination record if obtained from local pharmacy or Health Dept. Verbalized acceptance and understanding.  Pneumococcal vaccine status: Up to date  Covid-19 vaccine status: Information provided on how to obtain vaccines.   Qualifies for Shingles Vaccine? Yes   Zostavax completed Yes   Shingrix Completed?: Yes  Screening Tests Health Maintenance  Topic Date Due   COVID-19 Vaccine (5 - 2023-24 season) 05/10/2022   DTaP/Tdap/Td (4 - Td or Tdap) 08/13/2022   INFLUENZA VACCINE  04/10/2023   MAMMOGRAM  03/24/2024   Medicare Annual Wellness (AWV)  04/30/2024   Pneumonia Vaccine 20+ Years old  Completed   DEXA SCAN  Completed   Hepatitis C Screening  Completed   Zoster Vaccines- Shingrix  Completed   HPV VACCINES  Aged Out   Fecal DNA (Cologuard)  Discontinued    Health Maintenance  Health Maintenance Due  Topic Date Due   COVID-19 Vaccine (5 - 2023-24 season) 05/10/2022   DTaP/Tdap/Td (4 - Td or Tdap) 08/13/2022   INFLUENZA VACCINE  04/10/2023    Colorectal cancer screening: No longer required.   Mammogram status: Completed 03/25/23. Repeat every year  Bone Density status: Completed 03/29/20. Results reflect: Bone density results: OSTEOPENIA. Repeat every 2 years.   Additional Screening:  Hepatitis C Screening:  Completed 08/29/15  Vision Screening: Recommended annual ophthalmology exams for early detection of glaucoma and other disorders of the eye. Is the patient up to date with their annual eye exam?  Yes  Who is the provider or what is the name of the office in which the patient attends annual eye exams? [facility]  If pt is not established with a provider, would they like to be referred to a provider to establish care? No .   Dental Screening: Recommended annual dental exams for proper oral  hygiene    Community Resource Referral / Chronic Care Management: CRR required this visit?  No   CCM required this visit?  No     Plan:     I have personally reviewed and noted the following in the patient's chart:   Medical and social history Use of alcohol, tobacco or illicit drugs  Current medications and supplements including opioid prescriptions. Patient is not currently taking opioid prescriptions. Functional ability and status Nutritional status Physical activity Advanced directives List of other physicians Hospitalizations, surgeries, and ER visits in previous 12 months Vitals Screenings to include cognitive, depression, and falls Referrals  and appointments  In addition, I have reviewed and discussed with patient certain preventive protocols, quality metrics, and best practice recommendations. A written personalized care plan for preventive services as well as general preventive health recommendations were provided to patient.     Marzella Schlein, LPN   [id number]   After Visit Summary: (MyChart) Due to this being a telephonic visit, the after visit summary with patients personalized plan was offered to patient via MyChart   Nurse Notes: none

## 2023-05-02 ENCOUNTER — Encounter: Payer: Self-pay | Admitting: Family Medicine

## 2023-05-02 ENCOUNTER — Ambulatory Visit (INDEPENDENT_AMBULATORY_CARE_PROVIDER_SITE_OTHER): Payer: PPO | Admitting: Family Medicine

## 2023-05-02 VITALS — BP 138/62 | HR 95 | Temp 97.8°F | Ht 62.5 in | Wt 148.0 lb

## 2023-05-02 DIAGNOSIS — G4733 Obstructive sleep apnea (adult) (pediatric): Secondary | ICD-10-CM

## 2023-05-02 DIAGNOSIS — M8589 Other specified disorders of bone density and structure, multiple sites: Secondary | ICD-10-CM | POA: Diagnosis not present

## 2023-05-02 DIAGNOSIS — I10 Essential (primary) hypertension: Secondary | ICD-10-CM

## 2023-05-02 DIAGNOSIS — N3941 Urge incontinence: Secondary | ICD-10-CM

## 2023-05-02 DIAGNOSIS — Z7189 Other specified counseling: Secondary | ICD-10-CM

## 2023-05-02 DIAGNOSIS — J984 Other disorders of lung: Secondary | ICD-10-CM

## 2023-05-02 DIAGNOSIS — E782 Mixed hyperlipidemia: Secondary | ICD-10-CM

## 2023-05-02 DIAGNOSIS — K219 Gastro-esophageal reflux disease without esophagitis: Secondary | ICD-10-CM

## 2023-05-02 DIAGNOSIS — G5732 Lesion of lateral popliteal nerve, left lower limb: Secondary | ICD-10-CM

## 2023-05-02 DIAGNOSIS — F03A3 Unspecified dementia, mild, with mood disturbance: Secondary | ICD-10-CM

## 2023-05-02 DIAGNOSIS — E041 Nontoxic single thyroid nodule: Secondary | ICD-10-CM

## 2023-05-02 DIAGNOSIS — Z8673 Personal history of transient ischemic attack (TIA), and cerebral infarction without residual deficits: Secondary | ICD-10-CM

## 2023-05-02 DIAGNOSIS — F320 Major depressive disorder, single episode, mild: Secondary | ICD-10-CM

## 2023-05-02 DIAGNOSIS — Z0000 Encounter for general adult medical examination without abnormal findings: Secondary | ICD-10-CM | POA: Diagnosis not present

## 2023-05-02 DIAGNOSIS — J4520 Mild intermittent asthma, uncomplicated: Secondary | ICD-10-CM

## 2023-05-02 DIAGNOSIS — I700 Atherosclerosis of aorta: Secondary | ICD-10-CM

## 2023-05-02 MED ORDER — ROSUVASTATIN CALCIUM 10 MG PO TABS
ORAL_TABLET | ORAL | 4 refills | Status: DC
Start: 2023-05-02 — End: 2023-06-25

## 2023-05-02 MED ORDER — PANTOPRAZOLE SODIUM 40 MG PO TBEC
40.0000 mg | DELAYED_RELEASE_TABLET | Freq: Every day | ORAL | 4 refills | Status: DC
Start: 2023-05-02 — End: 2023-06-02

## 2023-05-02 MED ORDER — MIRABEGRON ER 25 MG PO TB24
25.0000 mg | ORAL_TABLET | Freq: Every day | ORAL | 3 refills | Status: DC
Start: 2023-05-02 — End: 2024-04-30

## 2023-05-02 MED ORDER — FLUTICASONE-SALMETEROL 250-50 MCG/ACT IN AEPB
1.0000 | INHALATION_SPRAY | Freq: Two times a day (BID) | RESPIRATORY_TRACT | 11 refills | Status: DC
Start: 2023-05-02 — End: 2024-06-07

## 2023-05-02 MED ORDER — FAMOTIDINE 20 MG PO TABS
20.0000 mg | ORAL_TABLET | Freq: Two times a day (BID) | ORAL | 4 refills | Status: DC
Start: 2023-05-02 — End: 2024-06-07

## 2023-05-02 MED ORDER — AMLODIPINE BESYLATE 5 MG PO TABS
5.0000 mg | ORAL_TABLET | Freq: Every day | ORAL | 4 refills | Status: DC
Start: 2023-05-02 — End: 2023-06-02

## 2023-05-02 MED ORDER — GABAPENTIN 100 MG PO CAPS
100.0000 mg | ORAL_CAPSULE | Freq: Two times a day (BID) | ORAL | 4 refills | Status: DC
Start: 2023-05-02 — End: 2023-06-02

## 2023-05-02 MED ORDER — BUPROPION HCL ER (SR) 100 MG PO TB12
100.0000 mg | ORAL_TABLET | Freq: Every day | ORAL | 4 refills | Status: DC
Start: 2023-05-02 — End: 2024-06-07

## 2023-05-02 NOTE — Assessment & Plan Note (Signed)
Chronic, stable. Continue myrbetriq.

## 2023-05-02 NOTE — Assessment & Plan Note (Signed)
Sees endo. Update TSH.

## 2023-05-02 NOTE — Assessment & Plan Note (Signed)
Chronic, breakthrough symptoms despite pantoprazole 40mg  daily with pepcid 20mg  BID. I asked her to verify she continues both medications.

## 2023-05-02 NOTE — Assessment & Plan Note (Signed)
Chronic, stable period on crestor 10mg  daily - continue. The ASCVD Risk score (Arnett DK, et al., 2019) failed to calculate for the following reasons:   The patient has a prior MI or stroke diagnosis

## 2023-05-02 NOTE — Assessment & Plan Note (Signed)
Asked to bring [place] copy of living will

## 2023-05-02 NOTE — Assessment & Plan Note (Signed)
Continue aspirin ,plavix, statin.

## 2023-05-02 NOTE — Assessment & Plan Note (Signed)
Followed by pulm, appreciate their care.

## 2023-05-02 NOTE — Progress Notes (Signed)
Ph: [phone] Fax: [phone]   Patient ID: Kathy Baldwin, female    DOB: 11-17-1945, 77 y.o.   MRN: [id number]  This visit was conducted in person.  BP 138/62   Pulse 95   Temp 97.8 F (36.6 C) (Temporal)   Ht 5' 2.5" (1.588 m)   Wt 148 lb (67.1 kg)   SpO2 97%   BMI 26.64 kg/m    CC: CPE Subjective:   HPI: Kathy Baldwin is a 77 y.o. female presenting on 05/02/2023 for Annual Exam (Not fasting )   Saw health advisor yesterday for medicare wellness visit. Note reviewed.   No results found.  Flowsheet Row Clinical Support from 05/01/2023 in [facility]  PHQ-2 Total Score 0          05/01/2023    2:36 PM 03/25/2023    2:50 PM 01/24/2023   11:31 AM 09/24/2022    3:23 PM 03/13/2022   10:06 AM  Fall Risk   Falls in the past year? 1 0 0 0 0  Number falls in past yr: 1 0   0  Injury with Fall? 0 0   0  Risk for fall due to : Impaired vision;History of fall(s) No Fall Risks   No Fall Risks  Follow up Falls prevention discussed Falls evaluation completed     1 wk ago while walking at home in the dark slipped and hit nightstand.   Memory difficulty on aricept 10mg  and namenda 10mg  bid followed by neurology Dr Malvin Johns. Also on plavix and aspirin. Has been [facility] neurology (07/2023).   Established with [facility] clinic cardiology Dr Melton Alar for HTN.   OSA - continues CPAP followed by pulm. 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 Also with h/o asthma, now in Wixela 250/59mcg 1 puff bid with benefit.   Notes worsening heartburn over the past 1 week. This is despite pantoprazole 40mg  daily and pepcid 20mg  BID.    Preventative: Colon cancer screening - cologuard 03/2018 normal, 03/2021 normal.  Mammo 03/2023 Birad1 @ [place]  Well woman exam - last pap normal 2017 - desires to age out. No fmhx GYN cancer. DEXA scan - osteopenia  managed by endo ([facility]) on reclast yearly infusion - stopped ~2021.  Lung cancer screening - not eligible  Flu shot -yearly COVID vaccine Moderna 10/2019, 11/2019, booster 06/2020, 01/2021, bivalent 06/2021 Td 2003, Tdap 2013 Prevnar13 2016, pneumovax 23 2012, 2015  Zostavax - 2013 Shingrix - 2019 x2 RSV - discussed, to consider Advanced directive discussion - has at home. Husband and daughter are HCPOA. Will bring me copy. Thinks would be DNR.  Seat belt use discussed Sunscreen use discussed. No changing moles on skin.  Smoking - none Alcohol  - none Dentist - q6 mo Eye exam - yearly Bowel - constipation managed with PRN miralax  Bladder - some urge incontinence, wears pad regularly. No significant stress incontinence symptoms.    Lives with husband [name]  Activity - no regular exercise, does yardwork Diet - good water, fruits/vegetables daily     Relevant past medical, surgical, family and social history reviewed and updated as indicated. Interim medical history since our last visit reviewed. Allergies and medications reviewed and updated. Outpatient Medications Prior to Visit  Medication Sig Dispense Refill   aspirin EC 81 MG EC tablet Take 1 tablet (81 mg total) by mouth daily. Swallow whole. 21 tablet  0   Calcium Carb-Cholecalciferol (CALCIUM 600+D) 600-800 MG-UNIT TABS Take by mouth daily. 2 capsule daily     clopidogrel (PLAVIX) 75 MG tablet Take 1 tablet (75 mg total) by mouth daily. 90 tablet 0   COLLAGEN PO Take by mouth.     conjugated estrogens (PREMARIN) vaginal cream Use a pea-sized amount externally and internally twice a week at night 30 g 1   Cyanocobalamin (B-12) 2500 MCG TABS Take 1 tablet by mouth daily. Pt taking 1/2 of a 5000 mcg tablet     donepezil (ARICEPT) 10 MG tablet Take 10 mg by mouth at bedtime.      FIBER PO Take by mouth.     fluticasone (FLONASE) 50 MCG/ACT nasal spray instill TWO SPRAYS in each nostril daily (shake gently) 48 g 3   meclizine  (ANTIVERT) 25 MG tablet Take 1 tablet (25 mg total) by mouth 3 (three) times daily as needed for dizziness. 30 tablet 0   memantine (NAMENDA) 10 MG tablet Take 10 mg by mouth 2 (two) times daily.     Multiple Vitamin (MULTIVITAMIN) tablet Take 1 tablet by mouth daily.     PEDIASURE/FIBER (PEDIASURE/FIBER) LIQD Take 10 mLs by mouth.     polyethylene glycol (MIRALAX / GLYCOLAX) 17 g packet Take 8.5-17 g by mouth daily as needed.     amLODipine (NORVASC) 5 MG tablet TAKE 1 TABLET BY MOUTH ONCE DAILY 90 tablet 0   buPROPion ER (WELLBUTRIN SR) 100 MG 12 hr tablet TAKE 1 TABLET BY MOUTH ONCE DAILY 30 tablet 1   famotidine (PEPCID) 20 MG tablet Take 1 tablet (20 mg total) by mouth 2 (two) times daily. For heartburn/indigestion 180 tablet 3   gabapentin (NEURONTIN) 100 MG capsule TAKE 1 CAPSULE BY MOUTH TWICE DAILY AS NEEDED FOR LEFT LEG PAIN 60 capsule 3   MYRBETRIQ 25 MG TB24 tablet TAKE ONE TABLET BY MOUTH ONCE DAILY 90 tablet 3   pantoprazole (PROTONIX) 40 MG tablet TAKE ONE TABLET BY MOUTH ONCE DAILY FOR gerd symptoms 90 tablet 2   rosuvastatin (CRESTOR) 10 MG tablet TAKE 1 TABLET BY MOUTH AT BEDTIME 90 tablet 0   WIXELA INHUB 250-50 MCG/ACT AEPB INHALE 1 PUFF BY MOUTH INTO LUNGS in THE morning AND AT bedtime 60 each 3   No facility-administered medications prior to visit.     Per HPI unless specifically indicated in ROS section below Review of Systems  Constitutional:  Negative for activity change, appetite change, chills, fatigue, fever and unexpected weight change.  HENT:  Negative for hearing loss.   Eyes:  Negative for visual disturbance.  Respiratory:  Positive for cough (occ). Negative for chest tightness, shortness of breath and wheezing.   Cardiovascular:  Negative for chest pain, palpitations and leg swelling.  Gastrointestinal:  Negative for abdominal distention, abdominal pain, blood in stool, constipation, diarrhea, nausea and vomiting.  Genitourinary:  Negative for difficulty  urinating and hematuria.  Musculoskeletal:  Negative for arthralgias, myalgias and neck pain.  Skin:  Negative for rash.  Neurological:  Negative for dizziness, seizures, syncope and headaches.  Hematological:  Negative for adenopathy. Does not bruise/bleed easily.  Psychiatric/Behavioral:  Negative for dysphoric mood. The patient is not nervous/anxious.     Objective:  BP 138/62   Pulse 95   Temp 97.8 F (36.6 C) (Temporal)   Ht 5' 2.5" (1.588 m)   Wt 148 lb (67.1 kg)   SpO2 97%   BMI 26.64 kg/m   Wt Readings from Last 3 Encounters:  05/02/23 148 lb (67.1 kg)  05/01/23 147 lb (66.7 kg)  04/07/23 147 lb (66.7 kg)      Physical Exam Vitals and nursing note reviewed.  Constitutional:      Appearance: Normal appearance. She is not ill-appearing.  HENT:     Head: Normocephalic and atraumatic.     Right Ear: Tympanic membrane, ear canal and external ear normal. There is no impacted cerumen.     Left Ear: Tympanic membrane, ear canal and external ear normal. There is no impacted cerumen.     Nose: Nose normal.     Mouth/Throat:     Mouth: Mucous membranes are moist.     Pharynx: Oropharynx is clear. No oropharyngeal exudate or posterior oropharyngeal erythema.  Eyes:     General:        Right eye: No discharge.        Left eye: No discharge.     Extraocular Movements: Extraocular movements intact.     Conjunctiva/sclera: Conjunctivae normal.     Pupils: Pupils are equal, round, and reactive to light.  Neck:     Thyroid: No thyroid mass or thyromegaly.     Vascular: No carotid bruit.  Cardiovascular:     Rate and Rhythm: Normal rate and regular rhythm.     Pulses: Normal pulses.     Heart sounds: Normal heart sounds. No murmur heard. Pulmonary:     Effort: Pulmonary effort is normal. No respiratory distress.     Breath sounds: Normal breath sounds. No wheezing, rhonchi or rales.  Abdominal:     General: Bowel sounds are normal. There is no distension.     Palpations:  Abdomen is soft. There is no mass.     Tenderness: There is no abdominal tenderness. There is no guarding or rebound.     Hernia: No hernia is present.  Musculoskeletal:     Cervical back: Normal range of motion and neck supple. No rigidity.     Right lower leg: No edema.     Left lower leg: No edema.  Lymphadenopathy:     Cervical: No cervical adenopathy.  Skin:    General: Skin is warm and dry.     Findings: No rash.  Neurological:     General: No focal deficit present.     Mental Status: She is alert. Mental status is at baseline.  Psychiatric:        Mood and Affect: Mood normal.        Behavior: Behavior normal.       Lab Results  Component Value Date   TSH 0.51 11/06/2021   Assessment & Plan:  Will need to verify she continues pantoprazole 40mg  daily with pepcid 20mg  BID, gabapentin 100mg  bid and plavix and aspirin.   Problem List Items Addressed This Visit     Health maintenance examination - Primary (Chronic)    Preventative protocols reviewed and updated unless pt declined. Discussed healthy diet and lifestyle.       Advanced directives, counseling/discussion (Chronic)    Asked to bring [place] copy of living will      Hyperlipidemia    Chronic, stable period on crestor 10mg  daily - continue. The ASCVD Risk score (Arnett DK, et al., 2019) failed to calculate for the following reasons:   The patient has a prior MI or stroke diagnosis       Relevant Medications   amLODipine (NORVASC) 5 MG tablet   rosuvastatin (CRESTOR) 10 MG tablet   Other Relevant Orders  Comprehensive metabolic panel   Lipid panel   Hypertension    Chronic, stable. Continue current regimen of amlodipine 5mg  daily      Relevant Medications   amLODipine (NORVASC) 5 MG tablet   rosuvastatin (CRESTOR) 10 MG tablet   Osteopenia    This is managed by endo ([facility]), previously on reclast infusions, stopped 2021. Last saw Dr Tedd Sias 04/2022 - rec at that time was repeat DEXA in 2026 and reassess  control.      Relevant Orders   VITAMIN D 25 Hydroxy (Vit-D Deficiency, Fractures)   GERD (gastroesophageal reflux disease)    Chronic, breakthrough symptoms despite pantoprazole 40mg  daily with pepcid 20mg  BID. I asked her to verify she continues both medications.       Relevant Medications   famotidine (PEPCID) 20 MG tablet   pantoprazole (PROTONIX) 40 MG tablet   OSA on CPAP    Followed by pulm, appreciate their care.       Aortic atherosclerosis (HCC)    Continue aspirin, plavix, statin.       Relevant Medications   amLODipine (NORVASC) 5 MG tablet   rosuvastatin (CRESTOR) 10 MG tablet   Thyroid nodule    Sees endo. Update TSH.       Relevant Orders   TSH   History of cerebellar stroke    Will need to verify she continues aspirin plavix and statin.       Mild dementia (HCC)    Followed by neurology Malvin Johns) on aricept and namenda.  Pending [facility] neurology evaluation to consider treatment with Leqembi twice monthly infusion.       Relevant Medications   buPROPion ER (WELLBUTRIN SR) 100 MG 12 hr tablet   gabapentin (NEURONTIN) 100 MG capsule   Urge urinary incontinence    Chronic, stable. Continue myrbetriq.       Relevant Medications   mirabegron ER (MYRBETRIQ) 25 MG TB24 tablet   Asthma    Followed by pulm on wixela controller inhaler.       Relevant Medications   fluticasone-salmeterol (WIXELA INHUB) 250-50 MCG/ACT AEPB   Peroneal nerve palsy, left    Continues gabapentin 100mg  BID with benefit. I asked her to verify she continues this.       Relevant Medications   buPROPion ER (WELLBUTRIN SR) 100 MG 12 hr tablet   gabapentin (NEURONTIN) 100 MG capsule   MDD (major depressive disorder), single episode, mild (HCC)    Chronic, stable period on wellbutrin SR 100mg  daily - continue.       Relevant Medications   buPROPion ER (WELLBUTRIN SR) 100 MG 12 hr tablet   Other Visit Diagnoses     Small airways disease       Relevant Medications    fluticasone-salmeterol (WIXELA INHUB) 250-50 MCG/ACT AEPB        Meds ordered this encounter  Medications   amLODipine (NORVASC) 5 MG tablet    Sig: Take 1 tablet (5 mg total) by mouth daily.    Dispense:  90 tablet    Refill:  4   buPROPion ER (WELLBUTRIN SR) 100 MG 12 hr tablet    Sig: Take 1 tablet (100 mg total) by mouth daily.    Dispense:  90 tablet    Refill:  4   famotidine (PEPCID) 20 MG tablet    Sig: Take 1 tablet (20 mg total) by mouth 2 (two) times daily. For heartburn/indigestion    Dispense:  180 tablet    Refill:  4  gabapentin (NEURONTIN) 100 MG capsule    Sig: Take 1 capsule (100 mg total) by mouth 2 (two) times daily.    Dispense:  180 capsule    Refill:  4   mirabegron ER (MYRBETRIQ) 25 MG TB24 tablet    Sig: Take 1 tablet (25 mg total) by mouth daily.    Dispense:  90 tablet    Refill:  3   pantoprazole (PROTONIX) 40 MG tablet    Sig: Take 1 tablet (40 mg total) by mouth daily.    Dispense:  90 tablet    Refill:  4   rosuvastatin (CRESTOR) 10 MG tablet    Sig: TAKE 1 TABLET BY MOUTH AT BEDTIME    Dispense:  90 tablet    Refill:  4   fluticasone-salmeterol (WIXELA INHUB) 250-50 MCG/ACT AEPB    Sig: Inhale 1 puff into the lungs in the morning and at bedtime.    Dispense:  60 each    Refill:  11    Orders Placed This Encounter  Procedures   TSH   Comprehensive metabolic panel   Lipid panel   VITAMIN D 25 Hydroxy (Vit-D Deficiency, Fractures)    Patient Instructions  You are doing well today  Labs today  If interested, check with pharmacy about new RSV shot.  Bring [place] a copy of your living will to update your chart.  Continue pantoprazole 40mg  daily for heartburn, may take pepcid 20mg  as well.   Return as needed or in 6 months for follow up visit  Double check you're still taking gabapentin 100mg  twice daily at home.  Follow up plan: Return in about 6 months (around 11/02/2023), or if symptoms worsen or fail to improve, for follow up  visit.  [name], MD

## 2023-05-02 NOTE — Assessment & Plan Note (Signed)
Chronic, stable period on wellbutrin SR 100mg  daily - continue.

## 2023-05-02 NOTE — Assessment & Plan Note (Signed)
Chronic, stable. Continue current regimen of amlodipine 5mg daily.  

## 2023-05-02 NOTE — Assessment & Plan Note (Signed)
Preventative protocols reviewed and updated unless pt declined. Discussed healthy diet and lifestyle.  

## 2023-05-02 NOTE — Assessment & Plan Note (Signed)
Continues gabapentin 100mg  BID with benefit. I asked her to verify she continues this.

## 2023-05-02 NOTE — Assessment & Plan Note (Addendum)
Followed by neurology Malvin Johns) on aricept and namenda.  Pending [facility] neurology evaluation to consider treatment with Leqembi twice monthly infusion.

## 2023-05-02 NOTE — Assessment & Plan Note (Signed)
Followed by pulm on wixela controller inhaler.

## 2023-05-02 NOTE — Assessment & Plan Note (Addendum)
Will need to verify she continues aspirin plavix and statin.

## 2023-05-02 NOTE — Assessment & Plan Note (Addendum)
This is managed by endo ([facility]), previously on reclast infusions, stopped 2021. Last saw Dr Tedd Sias 04/2022 - rec at that time was repeat DEXA in 2026 and reassess control.

## 2023-05-02 NOTE — Patient Instructions (Addendum)
You are doing well today  Labs today  If interested, check with pharmacy about new RSV shot.  Bring [place] a copy of your living will to update your chart.  Continue pantoprazole 40mg  daily for heartburn, may take pepcid 20mg  as well.   Return as needed or in 6 months for follow up visit  Double check you're still taking gabapentin 100mg  twice daily at home.

## 2023-05-03 LAB — VITAMIN D 25 HYDROXY (VIT D DEFICIENCY, FRACTURES): Vit D, 25-Hydroxy: 38 ng/mL (ref 30–100)

## 2023-05-03 LAB — LIPID PANEL
Cholesterol: 167 mg/dL (ref <200)
HDL: 80 mg/dL (ref >=50)
LDL Cholesterol (Calc): 68 mg/dL
Non-HDL Cholesterol (Calc): 87 mg/dL (ref <130)
Total CHOL/HDL Ratio: 2.1 (calc) (ref <5.0)
Triglycerides: 100 mg/dL (ref <150)

## 2023-05-03 LAB — COMPREHENSIVE METABOLIC PANEL
AG Ratio: 1.7 (calc) (ref 1.0–2.5)
ALT: 22 U/L (ref 6–29)
AST: 27 U/L (ref 10–35)
Albumin: 3.9 g/dL (ref 3.6–5.1)
Alkaline phosphatase (APISO): 84 U/L (ref 37–153)
BUN/Creatinine Ratio: 16 (calc) (ref 6–22)
BUN: 18 mg/dL (ref 7–25)
CO2: 27 mmol/L (ref 20–32)
Calcium: 9.7 mg/dL (ref 8.6–10.4)
Chloride: 107 mmol/L (ref 98–110)
Creat: 1.1 mg/dL — ABNORMAL HIGH (ref 0.60–1.00)
Globulin: 2.3 g/dL (ref 1.9–3.7)
Glucose, Bld: 92 mg/dL (ref 65–99)
Potassium: 3.7 mmol/L (ref 3.5–5.3)
Sodium: 143 mmol/L (ref 135–146)
Total Bilirubin: 0.5 mg/dL (ref 0.2–1.2)
Total Protein: 6.2 g/dL (ref 6.1–8.1)

## 2023-05-03 LAB — TSH: TSH: 0.23 m[IU]/L — ABNORMAL LOW (ref 0.40–4.50)

## 2023-05-09 ENCOUNTER — Encounter: Payer: Self-pay | Admitting: Family Medicine

## 2023-05-09 DIAGNOSIS — N289 Disorder of kidney and ureter, unspecified: Secondary | ICD-10-CM | POA: Insufficient documentation

## 2023-06-02 ENCOUNTER — Ambulatory Visit (INDEPENDENT_AMBULATORY_CARE_PROVIDER_SITE_OTHER)
Admission: RE | Admit: 2023-06-02 | Discharge: 2023-06-02 | Disposition: A | Discharge status: Home / Self Care | Payer: PPO | Source: Ambulatory Visit | Attending: Family Medicine | Admitting: Family Medicine

## 2023-06-02 ENCOUNTER — Ambulatory Visit (INDEPENDENT_AMBULATORY_CARE_PROVIDER_SITE_OTHER): Payer: PPO | Admitting: Family Medicine

## 2023-06-02 ENCOUNTER — Encounter: Payer: Self-pay | Admitting: Family Medicine

## 2023-06-02 VITALS — BP 120/62 | HR 67 | Temp 98.6°F | Ht 62.5 in | Wt 138.0 lb

## 2023-06-02 DIAGNOSIS — W19XXXA Unspecified fall, initial encounter: Secondary | ICD-10-CM | POA: Insufficient documentation

## 2023-06-02 DIAGNOSIS — G5732 Lesion of lateral popliteal nerve, left lower limb: Secondary | ICD-10-CM | POA: Diagnosis not present

## 2023-06-02 DIAGNOSIS — S6991XA Unspecified injury of right wrist, hand and finger(s), initial encounter: Secondary | ICD-10-CM | POA: Diagnosis not present

## 2023-06-02 DIAGNOSIS — K219 Gastro-esophageal reflux disease without esophagitis: Secondary | ICD-10-CM

## 2023-06-02 DIAGNOSIS — S6991XD Unspecified injury of right wrist, hand and finger(s), subsequent encounter: Secondary | ICD-10-CM | POA: Insufficient documentation

## 2023-06-02 DIAGNOSIS — I10 Essential (primary) hypertension: Secondary | ICD-10-CM | POA: Diagnosis not present

## 2023-06-02 MED ORDER — AMLODIPINE BESYLATE 5 MG PO TABS
2.5000 mg | ORAL_TABLET | Freq: Every day | ORAL | 3 refills | Status: DC
Start: 2023-06-02 — End: 2023-10-06

## 2023-06-02 NOTE — Patient Instructions (Addendum)
Drop amlodipine dose to 2.5mg  daily - new dose sent to pharmacy. You can cut current 5mg  tablets in half.  Xray overall looking ok - try wrist brace provided today for support.  Ice to wrist covered in a towel for 15 min at a time, tylenol for pain, elevate wrist above heart as able.  Let us know if not improving with time.

## 2023-06-02 NOTE — Assessment & Plan Note (Addendum)
Check films r/o scaphoid or other wrist fracture - no obvious fracture on my review. Will await radiology eval.  Placed in thumb spica wrist brace. Supportive measures reviewed - wrist brace use, elevation, ice, rest, tylenol for pain. Update if not improving over time.

## 2023-06-02 NOTE — Assessment & Plan Note (Signed)
Chronic, well controlled on ppi daily and pepcid 20mg  bid. Will drop ppi dose to every other day.

## 2023-06-02 NOTE — Assessment & Plan Note (Signed)
Concern for orthostatic hypotension with 10 point DBP drop - will drop amlodipine to 2.5mg  daily.  Encouraged good hydration.  Continue to monitor BP at home.

## 2023-06-02 NOTE — Assessment & Plan Note (Addendum)
Fall at night - video not suggestive of true syncope as she does brace herself for impact, then sits up right away.  ?orthostatic hypotension vs falling asleep vs other.  Will drop amlodipine to 2.5mg  daily as per above.

## 2023-06-02 NOTE — Progress Notes (Signed)
Ph: [phone] Fax: [phone]   Patient ID: Kathy Baldwin, female    DOB: Dec 19, 1945, 77 y.o.   MRN: [id number]  This visit was conducted in person.  BP 120/62   Pulse 67   Temp 98.6 F (37 C) (Temporal)   Ht 5' 2.5" (1.588 m)   Wt 138 lb (62.6 kg)   SpO2 97%   BMI 24.84 kg/m   BP Readings from Last 3 Encounters:  06/02/23 120/62  05/02/23 138/62  04/07/23 (!) 110/56    Orthostatic Vitals for the past 48 hrs (Last 6 readings):  Patient Position Orthostatic BP BP Pulse  06/02/23 1354 -- -- 120/62 67  06/02/23 1425 Supine 132/70 -- --  06/02/23 1428 Standing 122/60 -- --   CC: fall with R wrist injury Subjective:   HPI: NIVEA RIPPEL is a 77 y.o. female presenting on 06/02/2023 for Wrist Injury (Fall on Friday right wrist pain )   DOI: 05/30/2023  Fall Friday at midnight when she took dog out at night. Husband brings video from security camera. She landed on outstretched R wrist and also hit R temporal head.   She has had a few similar falls over the years.  Denies headaches or vision changes, palpitations or chest pain.  Occ dizziness - chronic     Relevant past medical, surgical, family and social history reviewed and updated as indicated. Interim medical history since our last visit reviewed. Allergies and medications reviewed and updated. Outpatient Medications Prior to Visit  Medication Sig Dispense Refill   aspirin EC 81 MG EC tablet Take 1 tablet (81 mg total) by mouth daily. Swallow whole. 21 tablet 0   buPROPion ER (WELLBUTRIN SR) 100 MG 12 hr tablet Take 1 tablet (100 mg total) by mouth daily. 90 tablet 4   Calcium Carb-Cholecalciferol (CALCIUM 600+D) 600-800 MG-UNIT TABS Take by mouth daily. 2 capsule daily     clopidogrel (PLAVIX) 75 MG tablet Take 1 tablet (75 mg total) by mouth daily. 90 tablet 0   COLLAGEN PO Take by mouth.     conjugated estrogens (PREMARIN) vaginal cream Use a pea-sized amount externally and internally twice a week  at night 30 g 1   Cyanocobalamin (B-12) 2500 MCG TABS Take 1 tablet by mouth daily. Pt taking 1/2 of a 5000 mcg tablet     donepezil (ARICEPT) 10 MG tablet Take 10 mg by mouth at bedtime.      famotidine (PEPCID) 20 MG tablet Take 1 tablet (20 mg total) by mouth 2 (two) times daily. For heartburn/indigestion 180 tablet 4   FIBER PO Take by mouth.     fluticasone (FLONASE) 50 MCG/ACT nasal spray instill TWO SPRAYS in each nostril daily (shake gently) 48 g 3   fluticasone-salmeterol (WIXELA INHUB) 250-50 MCG/ACT AEPB Inhale 1 puff into the lungs in the morning and at bedtime. 60 each 11   meclizine (ANTIVERT) 25 MG tablet Take 1 tablet (25 mg total) by mouth 3 (three) times daily as needed for dizziness. 30 tablet 0   memantine (NAMENDA) 10 MG tablet Take 10 mg by mouth 2 (two) times daily.     mirabegron ER (MYRBETRIQ) 25 MG TB24 tablet Take 1 tablet (25 mg total) by mouth daily. 90 tablet 3   Multiple Vitamin (MULTIVITAMIN) tablet Take 1 tablet by mouth daily.     PEDIASURE/FIBER (PEDIASURE/FIBER) LIQD Take 10 mLs by mouth.     polyethylene glycol (MIRALAX / GLYCOLAX) 17 g packet Take 8.5-17 g by mouth  daily as needed.     rosuvastatin (CRESTOR) 10 MG tablet TAKE 1 TABLET BY MOUTH AT BEDTIME 90 tablet 4   amLODipine (NORVASC) 5 MG tablet Take 1 tablet (5 mg total) by mouth daily. 90 tablet 4   gabapentin (NEURONTIN) 100 MG capsule Take 1 capsule (100 mg total) by mouth 2 (two) times daily. (Patient taking differently: Take 100 mg by mouth in the morning.) 180 capsule 4   pantoprazole (PROTONIX) 40 MG tablet Take 1 tablet (40 mg total) by mouth daily. 90 tablet 4   gabapentin (NEURONTIN) 100 MG capsule Take 1 capsule (100 mg total) by mouth in the morning.     pantoprazole (PROTONIX) 40 MG tablet Take 1 tablet (40 mg total) by mouth every other day.     No facility-administered medications prior to visit.     Per HPI unless specifically indicated in ROS section below Review of  Systems  Objective:  BP 120/62   Pulse 67   Temp 98.6 F (37 C) (Temporal)   Ht 5' 2.5" (1.588 m)   Wt 138 lb (62.6 kg)   SpO2 97%   BMI 24.84 kg/m   Wt Readings from Last 3 Encounters:  06/02/23 138 lb (62.6 kg)  05/02/23 148 lb (67.1 kg)  05/01/23 147 lb (66.7 kg)      Physical Exam Vitals and nursing note reviewed.  Constitutional:      Appearance: Normal appearance. She is not ill-appearing.  HENT:     Head:      Comments:  R temporal bruising without significant pain to palpation  No pain over temporal artery Musculoskeletal:        General: Swelling and tenderness present.     Right lower leg: No edema.     Left lower leg: No edema.     Comments:  2+ radial pulses bilaterally L hand: WNL R hand:  Point tender over anatomical snuff box  Limited ROM in flexion/extension due to pain  No pain to palpation of digits or MCPs or carpals FROM at R elbow flexion and extension   Skin:    General: Skin is warm and dry.     Findings: Bruising present. No rash.  Neurological:     Mental Status: She is alert.        Assessment & Plan:   Problem List Items Addressed This Visit     Hypertension    Concern for orthostatic hypotension with 10 point DBP drop - will drop amlodipine to 2.5mg  daily.  Encouraged good hydration.  Continue to monitor BP at home.       Relevant Medications   amLODipine (NORVASC) 5 MG tablet   GERD (gastroesophageal reflux disease)    Chronic, well controlled on ppi daily and pepcid 20mg  bid. Will drop ppi dose to every other day.      Relevant Medications   pantoprazole (PROTONIX) 40 MG tablet   Peroneal nerve palsy, left   Relevant Medications   gabapentin (NEURONTIN) 100 MG capsule   Fall with injury    Fall at night - video not suggestive of true syncope as she does brace herself for impact, then sits up right away.  ?orthostatic hypotension vs falling asleep vs other.  Will drop amlodipine to 2.5mg  daily as per above.        Right wrist injury, initial encounter - Primary    Check films r/o scaphoid or other wrist fracture - no obvious fracture on my review. Will await radiology eval.  Placed in thumb spica wrist brace. Supportive measures reviewed - wrist brace use, elevation, ice, rest, tylenol for pain. Update if not improving over time.      Relevant Orders   DG Wrist Complete Right     Meds ordered this encounter  Medications   amLODipine (NORVASC) 5 MG tablet    Sig: Take 0.5 tablets (2.5 mg total) by mouth daily.    Dispense:  90 tablet    Refill:  3    Orders Placed This Encounter  Procedures   DG Wrist Complete Right    Standing Status:   Future    Number of Occurrences:   1    Standing Expiration Date:   06/01/2024    Order Specific Question:   Reason for Exam (SYMPTOM  OR DIAGNOSIS REQUIRED)    Answer:   R wrist pain after fall on outstretched and    Order Specific Question:   Preferred imaging location?    Answer:   [facility]    Patient Instructions  Drop amlodipine dose to 2.5mg  daily - new dose sent to pharmacy. You can cut current 5mg  tablets in half.  Xray overall looking ok - try wrist brace provided today for support.  Ice to wrist covered in a towel for 15 min at a time, tylenol for pain, elevate wrist above heart as able.  Let us know if not improving with time.   Follow up plan: Return if symptoms worsen or fail to improve.  [name], MD

## 2023-06-23 ENCOUNTER — Observation Stay: Payer: PPO

## 2023-06-23 ENCOUNTER — Inpatient Hospital Stay
Admission: EM | Admit: 2023-06-23 | Discharge: 2023-06-25 | DRG: 065 | Disposition: A | Discharge status: Home Health | Payer: PPO | Attending: Internal Medicine | Admitting: Internal Medicine

## 2023-06-23 ENCOUNTER — Emergency Department: Payer: PPO

## 2023-06-23 ENCOUNTER — Other Ambulatory Visit: Payer: Self-pay

## 2023-06-23 ENCOUNTER — Encounter: Payer: Self-pay | Admitting: Medical Oncology

## 2023-06-23 DIAGNOSIS — I10 Essential (primary) hypertension: Secondary | ICD-10-CM | POA: Diagnosis present

## 2023-06-23 DIAGNOSIS — S62113A Displaced fracture of triquetrum [cuneiform] bone, unspecified wrist, initial encounter for closed fracture: Secondary | ICD-10-CM | POA: Diagnosis present

## 2023-06-23 DIAGNOSIS — K219 Gastro-esophageal reflux disease without esophagitis: Secondary | ICD-10-CM | POA: Diagnosis present

## 2023-06-23 DIAGNOSIS — I959 Hypotension, unspecified: Secondary | ICD-10-CM | POA: Diagnosis present

## 2023-06-23 DIAGNOSIS — Z79899 Other long term (current) drug therapy: Secondary | ICD-10-CM

## 2023-06-23 DIAGNOSIS — R29705 NIHSS score 5: Secondary | ICD-10-CM | POA: Diagnosis present

## 2023-06-23 DIAGNOSIS — Z7902 Long term (current) use of antithrombotics/antiplatelets: Secondary | ICD-10-CM

## 2023-06-23 DIAGNOSIS — Z66 Do not resuscitate: Secondary | ICD-10-CM | POA: Diagnosis present

## 2023-06-23 DIAGNOSIS — R2990 Unspecified symptoms and signs involving the nervous system: Secondary | ICD-10-CM | POA: Diagnosis not present

## 2023-06-23 DIAGNOSIS — G8194 Hemiplegia, unspecified affecting left nondominant side: Secondary | ICD-10-CM | POA: Diagnosis present

## 2023-06-23 DIAGNOSIS — I6523 Occlusion and stenosis of bilateral carotid arteries: Secondary | ICD-10-CM | POA: Diagnosis present

## 2023-06-23 DIAGNOSIS — J309 Allergic rhinitis, unspecified: Secondary | ICD-10-CM | POA: Diagnosis present

## 2023-06-23 DIAGNOSIS — I6381 Other cerebral infarction due to occlusion or stenosis of small artery: Secondary | ICD-10-CM | POA: Diagnosis not present

## 2023-06-23 DIAGNOSIS — I6521 Occlusion and stenosis of right carotid artery: Secondary | ICD-10-CM

## 2023-06-23 DIAGNOSIS — R4701 Aphasia: Secondary | ICD-10-CM | POA: Diagnosis present

## 2023-06-23 DIAGNOSIS — G4733 Obstructive sleep apnea (adult) (pediatric): Secondary | ICD-10-CM

## 2023-06-23 DIAGNOSIS — Z961 Presence of intraocular lens: Secondary | ICD-10-CM | POA: Diagnosis present

## 2023-06-23 DIAGNOSIS — M109 Gout, unspecified: Secondary | ICD-10-CM

## 2023-06-23 DIAGNOSIS — I700 Atherosclerosis of aorta: Secondary | ICD-10-CM | POA: Diagnosis present

## 2023-06-23 DIAGNOSIS — I639 Cerebral infarction, unspecified: Secondary | ICD-10-CM | POA: Diagnosis present

## 2023-06-23 DIAGNOSIS — Z7982 Long term (current) use of aspirin: Secondary | ICD-10-CM

## 2023-06-23 DIAGNOSIS — Z91018 Allergy to other foods: Secondary | ICD-10-CM

## 2023-06-23 DIAGNOSIS — Z83438 Family history of other disorder of lipoprotein metabolism and other lipidemia: Secondary | ICD-10-CM

## 2023-06-23 DIAGNOSIS — Z9842 Cataract extraction status, left eye: Secondary | ICD-10-CM

## 2023-06-23 DIAGNOSIS — Z9049 Acquired absence of other specified parts of digestive tract: Secondary | ICD-10-CM

## 2023-06-23 DIAGNOSIS — F03A3 Unspecified dementia, mild, with mood disturbance: Secondary | ICD-10-CM | POA: Diagnosis present

## 2023-06-23 DIAGNOSIS — M810 Age-related osteoporosis without current pathological fracture: Secondary | ICD-10-CM | POA: Diagnosis present

## 2023-06-23 DIAGNOSIS — R300 Dysuria: Secondary | ICD-10-CM | POA: Diagnosis present

## 2023-06-23 DIAGNOSIS — R531 Weakness: Secondary | ICD-10-CM | POA: Diagnosis not present

## 2023-06-23 DIAGNOSIS — F03A0 Unspecified dementia, mild, without behavioral disturbance, psychotic disturbance, mood disturbance, and anxiety: Secondary | ICD-10-CM | POA: Diagnosis present

## 2023-06-23 DIAGNOSIS — H9190 Unspecified hearing loss, unspecified ear: Secondary | ICD-10-CM | POA: Diagnosis present

## 2023-06-23 DIAGNOSIS — E782 Mixed hyperlipidemia: Secondary | ICD-10-CM

## 2023-06-23 DIAGNOSIS — Z823 Family history of stroke: Secondary | ICD-10-CM

## 2023-06-23 DIAGNOSIS — S62102S Fracture of unspecified carpal bone, left wrist, sequela: Secondary | ICD-10-CM

## 2023-06-23 DIAGNOSIS — E785 Hyperlipidemia, unspecified: Secondary | ICD-10-CM | POA: Diagnosis present

## 2023-06-23 DIAGNOSIS — Z9841 Cataract extraction status, right eye: Secondary | ICD-10-CM

## 2023-06-23 DIAGNOSIS — H9193 Unspecified hearing loss, bilateral: Secondary | ICD-10-CM | POA: Diagnosis present

## 2023-06-23 DIAGNOSIS — W19XXXA Unspecified fall, initial encounter: Secondary | ICD-10-CM | POA: Diagnosis present

## 2023-06-23 DIAGNOSIS — R001 Bradycardia, unspecified: Secondary | ICD-10-CM | POA: Diagnosis present

## 2023-06-23 DIAGNOSIS — Z8249 Family history of ischemic heart disease and other diseases of the circulatory system: Secondary | ICD-10-CM

## 2023-06-23 DIAGNOSIS — Z8673 Personal history of transient ischemic attack (TIA), and cerebral infarction without residual deficits: Secondary | ICD-10-CM

## 2023-06-23 DIAGNOSIS — F320 Major depressive disorder, single episode, mild: Secondary | ICD-10-CM | POA: Diagnosis present

## 2023-06-23 DIAGNOSIS — Z888 Allergy status to other drugs, medicaments and biological substances status: Secondary | ICD-10-CM

## 2023-06-23 DIAGNOSIS — M1000 Idiopathic gout, unspecified site: Secondary | ICD-10-CM

## 2023-06-23 LAB — URINALYSIS, COMPLETE (UACMP) WITH MICROSCOPIC
Bacteria, UA: NONE SEEN
Bilirubin Urine: NEGATIVE
Glucose, UA: NEGATIVE mg/dL
Hgb urine dipstick: NEGATIVE
Ketones, ur: NEGATIVE mg/dL
Leukocytes,Ua: NEGATIVE
Nitrite: NEGATIVE
Protein, ur: NEGATIVE mg/dL
Specific Gravity, Urine: 1.026 (ref 1.005–1.030)
Squamous Epithelial / HPF: 0 /[HPF] (ref 0–5)
pH: 6 (ref 5.0–8.0)

## 2023-06-23 LAB — TROPONIN I (HIGH SENSITIVITY)
Troponin I (High Sensitivity): 8 ng/L (ref <18)
Troponin I (High Sensitivity): 9 ng/L (ref <18)

## 2023-06-23 LAB — URINE DRUG SCREEN, QUALITATIVE (ARMC ONLY)
Amphetamines, Ur Screen: NOT DETECTED
Barbiturates, Ur Screen: NOT DETECTED
Benzodiazepine, Ur Scrn: NOT DETECTED
Cannabinoid 50 Ng, Ur ~~LOC~~: NOT DETECTED
Cocaine Metabolite,Ur ~~LOC~~: NOT DETECTED
MDMA (Ecstasy)Ur Screen: NOT DETECTED
Methadone Scn, Ur: NOT DETECTED
Opiate, Ur Screen: NOT DETECTED
Phencyclidine (PCP) Ur S: NOT DETECTED
Tricyclic, Ur Screen: NOT DETECTED

## 2023-06-23 LAB — CBC
HCT: 37.9 % (ref 36.0–46.0)
Hemoglobin: 12.4 g/dL (ref 12.0–15.0)
MCH: 30.5 pg (ref 26.0–34.0)
MCHC: 32.7 g/dL (ref 30.0–36.0)
MCV: 93.3 fL (ref 80.0–100.0)
Platelets: 226 10*3/uL (ref 150–400)
RBC: 4.06 MIL/uL (ref 3.87–5.11)
RDW: 13.6 % (ref 11.5–15.5)
WBC: 9.8 10*3/uL (ref 4.0–10.5)
nRBC: 0 % (ref 0.0–0.2)

## 2023-06-23 LAB — COMPREHENSIVE METABOLIC PANEL
ALT: 14 U/L (ref 0–44)
AST: 25 U/L (ref 15–41)
Albumin: 3.7 g/dL (ref 3.5–5.0)
Alkaline Phosphatase: 73 U/L (ref 38–126)
Anion gap: 8 (ref 5–15)
BUN: 24 mg/dL — ABNORMAL HIGH (ref 8–23)
CO2: 25 mmol/L (ref 22–32)
Calcium: 8.9 mg/dL (ref 8.9–10.3)
Chloride: 105 mmol/L (ref 98–111)
Creatinine, Ser: 1.13 mg/dL — ABNORMAL HIGH (ref 0.44–1.00)
GFR, Estimated: 50 mL/min — ABNORMAL LOW (ref >60)
Glucose, Bld: 115 mg/dL — ABNORMAL HIGH (ref 70–99)
Potassium: 3.7 mmol/L (ref 3.5–5.1)
Sodium: 138 mmol/L (ref 135–145)
Total Bilirubin: 1.3 mg/dL — ABNORMAL HIGH (ref 0.3–1.2)
Total Protein: 6.5 g/dL (ref 6.5–8.1)

## 2023-06-23 MED ORDER — BUPROPION HCL ER (SR) 100 MG PO TB12
100.0000 mg | ORAL_TABLET | Freq: Every day | ORAL | Status: DC
  Administered 2023-06-24 – 2023-06-25 (×2): 100 mg via ORAL
  Filled 2023-06-23 (×2): qty 1

## 2023-06-23 MED ORDER — ACETAMINOPHEN 160 MG/5ML PO SOLN: 650.0000 mg | ORAL | Status: DC | PRN

## 2023-06-23 MED ORDER — IOHEXOL 350 MG/ML SOLN
75.0000 mL | Freq: Once | INTRAVENOUS | Status: AC | PRN
  Administered 2023-06-23: 75 mL via INTRAVENOUS

## 2023-06-23 MED ORDER — ACETAMINOPHEN 325 MG PO TABS
650.0000 mg | ORAL_TABLET | ORAL | Status: DC | PRN
  Administered 2023-06-25: 650 mg via ORAL
  Filled 2023-06-23: qty 2

## 2023-06-23 MED ORDER — SODIUM CHLORIDE 0.9 % IV BOLUS
1000.0000 mL | Freq: Once | INTRAVENOUS | Status: AC
  Administered 2023-06-23: 1000 mL via INTRAVENOUS

## 2023-06-23 MED ORDER — ONDANSETRON HCL 4 MG/2ML IJ SOLN: 4.0000 mg | Freq: Four times a day (QID) | INTRAMUSCULAR | Status: DC | PRN

## 2023-06-23 MED ORDER — GABAPENTIN 100 MG PO CAPS
100.0000 mg | ORAL_CAPSULE | Freq: Every day | ORAL | Status: DC
  Administered 2023-06-24 – 2023-06-25 (×2): 100 mg via ORAL
  Filled 2023-06-23 (×2): qty 1

## 2023-06-23 MED ORDER — CLOPIDOGREL BISULFATE 75 MG PO TABS
75.0000 mg | ORAL_TABLET | Freq: Every day | ORAL | Status: DC
  Administered 2023-06-24 – 2023-06-25 (×2): 75 mg via ORAL
  Filled 2023-06-23 (×2): qty 1

## 2023-06-23 MED ORDER — SENNOSIDES-DOCUSATE SODIUM 8.6-50 MG PO TABS: 1.0000 | ORAL_TABLET | Freq: Every evening | ORAL | Status: DC | PRN

## 2023-06-23 MED ORDER — OXYCODONE HCL 5 MG PO TABS
5.0000 mg | ORAL_TABLET | Freq: Four times a day (QID) | ORAL | Status: AC | PRN
  Administered 2023-06-23: 5 mg via ORAL
  Filled 2023-06-23: qty 1

## 2023-06-23 MED ORDER — HYDRALAZINE HCL 20 MG/ML IJ SOLN: 5.0000 mg | INTRAMUSCULAR | Status: AC | PRN

## 2023-06-23 MED ORDER — PANTOPRAZOLE SODIUM 40 MG PO TBEC
40.0000 mg | DELAYED_RELEASE_TABLET | ORAL | Status: DC
  Administered 2023-06-24: 40 mg via ORAL
  Filled 2023-06-23 (×2): qty 1

## 2023-06-23 MED ORDER — ACETAMINOPHEN 650 MG RE SUPP: 650.0000 mg | RECTAL | Status: DC | PRN

## 2023-06-23 MED ORDER — DONEPEZIL HCL 5 MG PO TABS
10.0000 mg | ORAL_TABLET | Freq: Every day | ORAL | Status: DC
  Administered 2023-06-24: 10 mg via ORAL
  Filled 2023-06-23: qty 2

## 2023-06-23 MED ORDER — MEMANTINE HCL 5 MG PO TABS
10.0000 mg | ORAL_TABLET | Freq: Two times a day (BID) | ORAL | Status: DC
  Administered 2023-06-24 – 2023-06-25 (×3): 10 mg via ORAL
  Filled 2023-06-23 (×3): qty 2

## 2023-06-23 MED ORDER — POLYETHYLENE GLYCOL 3350 17 G PO PACK: 17.0000 g | PACK | Freq: Two times a day (BID) | ORAL | Status: DC | PRN

## 2023-06-23 MED ORDER — FAMOTIDINE 20 MG PO TABS
10.0000 mg | ORAL_TABLET | Freq: Two times a day (BID) | ORAL | Status: DC
  Administered 2023-06-24 – 2023-06-25 (×3): 10 mg via ORAL
  Filled 2023-06-23 (×3): qty 1

## 2023-06-23 MED ORDER — STROKE: EARLY STAGES OF RECOVERY BOOK: Freq: Once | Status: AC

## 2023-06-23 MED ORDER — MECLIZINE HCL 25 MG PO TABS
25.0000 mg | ORAL_TABLET | Freq: Three times a day (TID) | ORAL | Status: DC | PRN
  Administered 2023-06-24: 25 mg via ORAL
  Filled 2023-06-23 (×2): qty 1

## 2023-06-23 MED ORDER — ROSUVASTATIN CALCIUM 10 MG PO TABS
10.0000 mg | ORAL_TABLET | Freq: Every day | ORAL | Status: DC
  Administered 2023-06-24: 10 mg via ORAL
  Filled 2023-06-23: qty 1

## 2023-06-23 MED ORDER — ASPIRIN 81 MG PO TBEC
81.0000 mg | DELAYED_RELEASE_TABLET | Freq: Every day | ORAL | Status: DC
  Administered 2023-06-24 – 2023-06-25 (×2): 81 mg via ORAL
  Filled 2023-06-23 (×2): qty 1

## 2023-06-23 NOTE — ED Triage Notes (Signed)
Pt from home via ems with reports of feeling "off" last night. Had some shaking all over and started to have weakness to left side with pain to left wrist and left leg. Pt has hx of CVA with rt sided deficits. When ems arrived pts BP was 77/46 then after standing pt it dropped to 60/30. IV was started and 500NS given.

## 2023-06-23 NOTE — Assessment & Plan Note (Signed)
Hydralazine 5 mg IV every 4 hours as needed for SBP greater than 190/100

## 2023-06-23 NOTE — Progress Notes (Signed)
[facility] Progress Note  Received message from nursing that patient is endorsing LEFT wrist pain.  Per nursing, patient states that she had fallen on her left arm at home and has been hurting since.  I noted that on my physical exam, patient attempted to squeeze my fingers with both hands, patient did not endorse pain at that time.  The left hand is significantly weaker than the right hand.  The left leg including hips and knees were significantly weaker than the right lower extremities.  # Left wrist pain-query pain from regaining function in setting of acute stroke However given patient endorsing left wrist pain from possible injury, fracture/stress fracture cannot be excluded at this time Left wrist 2 view x-ray ordered - Symptomatic support: Acetaminophen 650 mg p.o./solution/suppository as needed for mild pain; oxycodone 5 mg p.o. every 6 hours as needed for moderate and severe pain, 1 day ordered - Pending x-ray read, can order a left wrist splint for patient  # Right risk possible subtle triquetral fracture diagnosed on 06/02/2023 - Right wrist splint ordered as recommended by PCP and outpatient note  Discussed with nursing.  Dr. Sedalia Muta

## 2023-06-23 NOTE — Assessment & Plan Note (Signed)
-  Aspirin 81 mg, Plavix 75 mg daily resumed ?

## 2023-06-23 NOTE — Assessment & Plan Note (Addendum)
Small acute right basal ganglia infarct.  Complete echo Neurology has been consulted, appreciate further recommendation and guidance Fasting lipid and A1c ordered Permissive hypertension, pending MRI Frequent neuro vascular checks PT, OT Fall precaution

## 2023-06-23 NOTE — Assessment & Plan Note (Signed)
To Dean 20 mg p.o. twice daily resumed, pantoprazole 40 mg every other day resumed

## 2023-06-23 NOTE — Assessment & Plan Note (Signed)
Bupropion 100 mg daily resumed

## 2023-06-23 NOTE — Assessment & Plan Note (Signed)
Continue Crestor and Zetia

## 2023-06-23 NOTE — Assessment & Plan Note (Signed)
Check UA, POA

## 2023-06-23 NOTE — Assessment & Plan Note (Signed)
-   CPAP nightly ordered 

## 2023-06-23 NOTE — Consult Note (Signed)
Hospital Consult    Reason for Consult:  Hypotension with Left sided weakness and confusion.  Requesting Physician:  Dr Minna Antis MD MRN #:  [id number]  History of Present Illness: This is a 77 y.o. female with a past history of arthritis, gastric reflux, hypertension, hyperlipidemia, prior CVA January 2023, presents to the emergency department for generalized weakness as well as left arm and left leg weakness. According to the patient she states she was driving yesterday when she became confused, was able to get home but states she had "the shakes" all last night. This morning patient woke to feeling very weak with specific weakness in the left arm and left leg, unable to ambulate so she came to the emergency department via EMS. Patient states left leg pain is fairly chronic for her but the left arm pain is new.   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 Holly Bodily 2010   DNR no code (do not resuscitate) 03/20/2017   GERD (gastroesophageal reflux disease)    Hearing loss    History of gallstones    Hyperlipidemia    Hypertension    Hypertrophy of nasal turbinates    Leg mass, left    in calf muscle seeing surgeon tomorrow regarding   Lymphocytosis    Menopause age 43   Multiple food allergies    chocolate and cabbage   OSA on CPAP    Osteopenia Oct. 2014   Sleep apnea    Stroke (HCC) 09/11/2021   Thyroid nodule    Vertigo    Vitamin D deficiency disease     Past Surgical History:  Procedure Laterality Date   BREAST CYST ASPIRATION Right    negative   CARPAL TUNNEL RELEASE Right 2015   CATARACT EXTRACTION W/PHACO Left 09/30/2018   Procedure: CATARACT EXTRACTION PHACO AND INTRAOCULAR LENS  PLACEMENT (IOC)  LEFT;  Surgeon: Lockie Mola, MD;  Location: [facility];  Service: Ophthalmology;  Laterality: Left;  sleep apnea   CATARACT EXTRACTION W/PHACO Right 10/28/2018   Procedure: CATARACT EXTRACTION PHACO AND INTRAOCULAR LENS PLACEMENT (IOC)  RIGHT;  Surgeon: Lockie Mola, MD;  Location: [facility];  Service: Ophthalmology;  Laterality: Right;   CHOLECYSTECTOMY  1990   COLONOSCOPY  04/25/2008   CYST EXCISION Left 2020   L popliteal/peroneal cyst excision s/p residual numbness   DILATION AND CURETTAGE OF UTERUS  1982   s/p miscarriage   ESOPHAGOGASTRODUODENOSCOPY  08/2020   chronic gastritis, neg H pylori, no Barretts, no rpt needed [facility])    Allergies  Allergen Reactions   Cabbage Other (See Comments)    Hypersensitivity on allergy testing   Chocolate Other (See Comments)    Hypersensitivity on allergy testing   Statins Other (See Comments)    Zocor and Pravastatin--leg cramps Other reaction(s): Other (See Comments) Zocor and Pravastatin--leg cramps    Prior to Admission medications   Medication Sig Start Date End Date Taking? Authorizing Provider  amLODipine (NORVASC) 5 MG tablet Take 0.5 tablets (2.5 mg total) by mouth daily. 06/02/23   [name], MD  aspirin EC 81 MG EC tablet Take 1 tablet (81 mg total) by mouth daily. Swallow whole. 09/13/21  Alford Highland, MD  buPROPion ER [facility] SR) 100 MG 12 hr tablet Take 1 tablet (100 mg total) by mouth daily. 05/02/23   [name], MD  Calcium Carb-Cholecalciferol (CALCIUM 600+D) 600-800 MG-UNIT TABS Take by mouth daily. 2 capsule daily    [provider]  clopidogrel (PLAVIX) 75 MG tablet Take 1 tablet (75 mg total) by mouth daily. 09/13/21   Alford Highland, MD  COLLAGEN PO Take by mouth.    [provider]  conjugated estrogens (PREMARIN) vaginal cream Use a pea-sized amount externally and internally twice a week at night 04/17/23   [name], MD   Cyanocobalamin (B-12) 2500 MCG TABS Take 1 tablet by mouth daily. Pt taking 1/2 of a 5000 mcg tablet    [provider]  donepezil (ARICEPT) 10 MG tablet Take 10 mg by mouth at bedtime.  02/16/19   [provider]  famotidine (PEPCID) 20 MG tablet Take 1 tablet (20 mg total) by mouth 2 (two) times daily. For heartburn/indigestion 05/02/23   [name], MD  FIBER PO Take by mouth.    [provider]  fluticasone (FLONASE) 50 MCG/ACT nasal spray instill TWO SPRAYS in each nostril daily (shake gently) 06/07/22   [name], MD  fluticasone-salmeterol [facility] INHUB) 250-50 MCG/ACT AEPB Inhale 1 puff into the lungs in the morning and at bedtime. 05/02/23   [name], MD  gabapentin (NEURONTIN) 100 MG capsule Take 1 capsule (100 mg total) by mouth in the morning. 06/02/23   [name], MD  meclizine (ANTIVERT) 25 MG tablet Take 1 tablet (25 mg total) by mouth 3 (three) times daily as needed for dizziness. 11/06/21   [name], MD  memantine (NAMENDA) 10 MG tablet Take 10 mg by mouth 2 (two) times daily. 03/29/20   [provider]  mirabegron ER (MYRBETRIQ) 25 MG TB24 tablet Take 1 tablet (25 mg total) by mouth daily. 05/02/23   [name], MD  Multiple Vitamin (MULTIVITAMIN) tablet Take 1 tablet by mouth daily.    [provider]  pantoprazole (PROTONIX) 40 MG tablet Take 1 tablet (40 mg total) by mouth every other day. 06/02/23   [name], MD  PEDIASURE/FIBER (PEDIASURE/FIBER) LIQD Take 10 mLs by mouth.    [provider]  polyethylene glycol (MIRALAX / GLYCOLAX) 17 g packet Take 8.5-17 g by mouth daily as needed. 04/18/22   [name], MD  rosuvastatin (CRESTOR) 10 MG tablet TAKE 1 TABLET BY MOUTH AT BEDTIME 05/02/23   [name], MD    Social History   Socioeconomic History   Marital status: Married    Spouse name: [name]   Number of children: 1   Years of education: Not on file   Highest  education level: Bachelor's degree (e.g., BA, AB, BS)  Occupational History   Occupation: Retired  Tobacco Use   Smoking status: Never   Smokeless tobacco: Never   Tobacco comments:    Never smoked, so cannot quit...  Vaping Use   Vaping status: Never Used  Substance and Sexual Activity   Alcohol use: Not Currently    Alcohol/week: 0.0 standard drinks of alcohol    Comment: Occasional celebratory drink (holidays, etc.) only.   Drug use: No   Sexual activity: Yes    Partners: Male    Birth control/protection: Post-menopausal  Other Topics Concern   Not on file  Social History Narrative   Lives with husband [name]   Occ: retired Diplomatic Services operational officer   Edu: BA   Act:  Diet:    Social Determinants of Health   Financial Resource Strain: Low Risk  (05/01/2023)   Overall Financial Resource Strain (CARDIA)    Difficulty of Paying Living Expenses: Not hard at all  Food Insecurity: No Food Insecurity (05/01/2023)   Hunger Vital Sign    Worried About Running Out of Food in the Last Year: Never true    Ran Out of Food in the Last Year: Never true  Transportation Needs: No Transportation Needs (05/01/2023)   PRAPARE - Administrator, Civil Service (Medical): No    Lack of Transportation (Non-Medical): No  Physical Activity: Inactive (05/01/2023)   Exercise Vital Sign    Days of Exercise per Week: 0 days    Minutes of Exercise per Session: 0 min  Stress: No Stress Concern Present (05/01/2023)   [employer] of Occupational Health - Occupational Stress Questionnaire    Feeling of Stress : Not at all  Social Connections: Moderately Isolated (05/01/2023)   Social Connection and Isolation Panel [NHANES]    Frequency of Communication with Friends and Family: More than three times a week    Frequency of Social Gatherings with Friends and Family: More than three times a week    Attends Religious Services: Never    Database administrator or Organizations: No    Attends Occupational hygienist Meetings: Never    Marital Status: Married  Catering manager Violence: Not At Risk (05/01/2023)   Humiliation, Afraid, Rape, and Kick questionnaire    Fear of Current or Ex-Partner: No    Emotionally Abused: No    Physically Abused: No    Sexually Abused: No     Family History  Problem Relation Age of Onset   Healthy Mother    Arthritis Mother    Lung disease Father        black lung   Heart disease Father    Hyperlipidemia Father    Hypertension Father    Emphysema Sister    Healthy Sister    Cancer Brother        unsure of type   Asthma Daughter    Heart disease Sister    Thyroid disease Sister    Stroke Maternal Grandmother    Hypertension Maternal Grandmother    Diabetes Maternal Grandmother    Diabetes Sister    Healthy Sister    Breast cancer Neg Hx     ROS: Otherwise negative unless mentioned in HPI  Physical Examination  Vitals:   06/23/23 1130 06/23/23 1200  BP: (!) 134/56 (!) 125/55  Pulse:  65  Resp: (!) 22 (!) 21  Temp:    SpO2:  (!) 85%   Body mass index is 25.4 kg/m.  General:  WDWN in NAD Gait: Not observed HENT: WNL, normocephalic Pulmonary: normal non-labored breathing, without Rales, rhonchi,  wheezing Cardiac: {Desc; regular/irreg:14544}, without  Murmurs, rubs or gallops; {With/Without:20273} carotid bruits Abdomen: *** soft, NT/ND, no masses Skin: {With/Without:20273} rashes Vascular Exam/Pulses: *** Extremities: {With/Without:20273} ischemic changes, {With/Without:20273} Gangrene , {With/Without:20273} cellulitis; {With/Without:20273} open wounds;  Musculoskeletal: no muscle wasting or atrophy  Neurologic: A&O X 3;  No focal weakness or paresthesias are detected; speech is fluent/normal Psychiatric:  The pt has {Desc; normal/abnormal:11317::"Normal"} affect. Lymph:  Unremarkable  CBC    Component Value Date/Time   WBC 9.8 06/23/2023 0831   RBC 4.06 06/23/2023 0831   HGB 12.4 06/23/2023 0831   HGB 14.2  05/02/2015 0819   HCT 37.9 06/23/2023 0831   HCT  41.9 05/02/2015 0819   PLT 226 06/23/2023 0831   PLT 267 05/02/2015 0819   MCV 93.3 06/23/2023 0831   MCV 89 05/02/2015 0819   MCH 30.5 06/23/2023 0831   MCHC 32.7 06/23/2023 0831   RDW 13.6 06/23/2023 0831   RDW 13.8 05/02/2015 0819   LYMPHSABS 2.0 11/06/2021 1015   LYMPHSABS 3.6 (H) 05/02/2015 0819   MONOABS 0.5 11/06/2021 1015   EOSABS 0.2 11/06/2021 1015   EOSABS 0.2 05/02/2015 0819   BASOSABS 0.0 11/06/2021 1015   BASOSABS 0.0 05/02/2015 0819    BMET    Component Value Date/Time   NA 138 06/23/2023 0831   NA 143 12/04/2015 0851   K 3.7 06/23/2023 0831   CL 105 06/23/2023 0831   CO2 25 06/23/2023 0831   GLUCOSE 115 (H) 06/23/2023 0831   BUN 24 (H) 06/23/2023 0831   BUN 11 12/04/2015 0851   CREATININE 1.13 (H) 06/23/2023 0831   CREATININE 1.10 (H) 05/02/2023 1458   CALCIUM 8.9 06/23/2023 0831   GFRNONAA 50 (L) 06/23/2023 0831   GFRNONAA 75 10/08/2018 1156   GFRAA 87 10/08/2018 1156    COAGS: Lab Results  Component Value Date   INR 1.1 09/11/2021     Non-Invasive Vascular Imaging:   EXAM:06/23/23 CT ANGIOGRAPHY HEAD AND NECK WITH AND WITHOUT CONTRAST   TECHNIQUE: Multidetector CT imaging of the head and neck was performed using the standard protocol during bolus administration of intravenous contrast. Multiplanar CT image reconstructions and MIPs were obtained to evaluate the vascular anatomy. Carotid stenosis measurements (when applicable) are obtained utilizing NASCET criteria, using the distal internal carotid diameter as the denominator.   RADIATION DOSE REDUCTION: This exam was performed according to the departmental dose-optimization program which includes automated exposure control, adjustment of the mA and/or kV according to patient size and/or use of iterative reconstruction technique.   CONTRAST:  75mL OMNIPAQUE IOHEXOL 350 MG/ML SOLN   COMPARISON:  Brain MRI 09/11/2021. Head CT 09/12/2021.  CTA head and neck 09/11/2021.   FINDINGS: CT HEAD   Brain: Stable cerebral volume. No midline shift, ventriculomegaly, mass effect, evidence of mass lesion, intracranial hemorrhage or evidence of cortically based acute infarction. Small chronic bilateral cerebellar infarcts. Patchy and confluent cerebral white matter hypodensity appears stable.   Calvarium and skull base: No acute osseous abnormality identified.   Paranasal sinuses: Visualized paranasal sinuses and mastoids are stable and well aerated.   Orbits: No acute orbit or scalp soft tissue finding.   CTA NECK   Skeleton: Widespread chronic cervical spine degeneration. No acute osseous abnormality identified.   Upper chest: Chronic mosaic attenuation in the upper lungs is stable.   Other neck: Chronic thyroid heterogeneity This has been evaluated on previous imaging. (ref: J Am Coll Radiol. 2015 Feb;12(2): 143-50).Chronic partially retropharyngeal course of the right carotid.   Aortic arch: Calcified aortic atherosclerosis.  3 vessel arch.   Right carotid system: No brachiocephalic or proximal right CCA plaque or stenosis. Tortuous right CCA origin. Moderate calcified plaque at the right ICA origin and bulb results in 50 % stenosis with respect to the distal vessel, stable.   Left carotid system: No left CCA plaque or stenosis. Mild calcified plaque at the left ICA origin and bulb without stenosis. Tortuous left ICA in the upper neck.   Vertebral arteries: Proximal right subclavian and right vertebral artery origin plaque without stenosis appears stable. Mildly non dominant right vertebral artery is patent and stable to the skull base without stenosis.   Proximal  left subclavian calcified plaque with less than 50 % stenosis with respect to the distal vessel. Chronic calcified plaque at the left vertebral artery origin but only mild origin stenosis, stable since last year. Dominant left vertebral artery is  patent and stable to the skull base without additional stenosis.   CTA HEAD   Posterior circulation: Distal vertebral arteries and vertebrobasilar junction remain patent without plaque or stenosis. Dominant left V4. Patent PICA origins. Patent basilar artery without stenosis. Normal SCA and PCA origins. Posterior communicating arteries are diminutive or absent. Bilateral PCAs are stable, within normal limits.   Anterior circulation: Both ICA siphons are patent. Left siphon calcified plaque with mild supraclinoid stenosis is stable. Right siphon heavy supraclinoid calcified plaque with moderate to severe stenosis appears progressed since last year on series 10, image 234.   Both carotid termini remain patent. Left ACA A1 segment is diminutive or absent. Right A1, anterior communicating artery, bilateral ACA branches are within normal limits. Left MCA M1 segment and bifurcation are patent without stenosis. Right MCA M1 segment and trifurcation are patent without stenosis. Bilateral MCA branches are stable and within normal limits.   Venous sinuses: Patent.   Anatomic variants: Dominant left vertebral artery. Dominant right and diminutive or absent left ACA A1 segments.   Review of the MIP images confirms the above findings   IMPRESSION: 1. Negative for large vessel occlusion. But chronic atherosclerosis appears progressed at the Right ICA siphon supraclinoid segment since last year, now with Moderate to Severe stenosis there.   2. No other significant stenosis on CTA head and neck. Aortic Atherosclerosis (ICD10-I70.0).   3. Stable CT appearance of the chronic small vessel disease, chronic small cerebellar infarcts.  Statin:  Yes.   Beta Blocker:  No. Aspirin:  Yes.   ACEI:  No. ARB:  No. CCB use:  Yes Other antiplatelets/anticoagulants:  Yes.   Plavix 75 mg Daily    ASSESSMENT/PLAN: This is a 77 y.o. female ***   -***   Jordann Grime R [facility] 06/23/2023 1:29 PM

## 2023-06-23 NOTE — Hospital Course (Addendum)
Ms. Kathy Baldwin is a 77 year old female with history of hyperlipidemia, hypertension, GERD, OSA on CPAP, history of memory deficit/mild dementia, who presents to the emergency department for chief concerns of altered mental status and left-sided weakness.  Vitals in the ED showed temperature of 97.9, respiration rate of 15, heart rate of 55, blood pressure 119/49, SpO2 93% on room air.  Serum sodium is 138, potassium 3.7, chloride 105, bicarb 25, BUN of 24, serum creatinine 1.13, EGFR 50, nonfasting blood glucose 115, WBC 9.8, hemoglobin 12.4, platelets of 226.  T. bili was 1.3.  High sensitive troponin was 8 and on repeat was 9.  CTA head and neck w and wo CM: Was read as negative for large vessel occlusion.  Chronic atherosclerosis appears progressed at the right ICA siphon supraclinoid segment since last year, now moderate to severe stenosis there.  No other significant stenosis on CTA of the head and neck.  Aortic atherosclerosis.  Chronic small vessel disease, chronic small cerebellar infarcts.  ED treatment: None

## 2023-06-23 NOTE — ED Provider Notes (Signed)
[facility] Provider Note    Event Date/Time   First MD Initiated Contact with Patient 06/23/23 [id number]     (approximate)  History   Chief Complaint: Weakness and Arm Pain  HPI  Kathy Baldwin is a 77 y.o. female with a past history of arthritis, gastric reflux, hypertension, hyperlipidemia, prior CVA January 2023, presents to the emergency department for generalized weakness as well as left arm and left leg weakness.  According to the patient she states she was driving yesterday when she became confused, was able to get home but states she had "the shakes" all last night.  This morning patient woke to feeling very weak with specific weakness in the left arm and left leg, unable to ambulate so she came to the emergency department via EMS.  Patient denies any recent illnesses denies any fever cough congestion vomiting or diarrhea.  Patient is stating pain in the left arm and leg but denies any other pain.  Patient states left leg pain is fairly chronic for her but the left arm pain is new.  EMS states upon their arrival patient was hypotensive in the 70s however with minimal fluid patient is now normotensive 118 systolic.  Physical Exam   Triage Vital Signs: ED Triage Vitals  Encounter Vitals Group     BP 06/23/23 0829 (!) 118/54     Systolic BP Percentile --      Diastolic BP Percentile --      Pulse Rate 06/23/23 0829 60     Resp 06/23/23 0829 16     Temp 06/23/23 0829 97.9 F (36.6 C)     Temp Source 06/23/23 0829 Oral     SpO2 06/23/23 0829 97 %     Weight 06/23/23 0824 138 lb 14.2 oz (63 kg)     Height 06/23/23 0824 5\' 2"  (1.575 m)     Head Circumference --      Peak Flow --      Pain Score 06/23/23 0824 8     Pain Loc --      Pain Education --      Exclude from Growth Chart --     Most recent vital signs: Vitals:   06/23/23 0829  BP: (!) 118/54  Pulse: 60  Resp: 16  Temp: 97.9 F (36.6 C)  SpO2: 97%    General: Awake, no distress.   CV:  Good peripheral perfusion.  Regular rate and rhythm  Resp:  Normal effort.  Equal breath sounds bilaterally.  Abd:  No distention.  Soft, nontender.  No rebound or guarding. Other:  Patient has 3/5 strength in left upper and lower extremity, normal strength in right upper and lower extremity.  Cranial nerves intact.   ED Results / Procedures / Treatments   EKG  EKG viewed and interpreted by myself shows a normal sinus rhythm at 57 bpm with a narrow QRS, normal axis, normal intervals, no concerning ST changes.  RADIOLOGY  I reviewed the Noncon CT images of the head I do not see any bleed on my evaluation. Radiology has read the CTA as negative for large vessel occlusion but she does have progressive atherosclerosis of the right ICA now with moderate to severe stenosis compared to last year.   MEDICATIONS ORDERED IN ED: Medications  sodium chloride 0.9 % bolus 1,000 mL (has no administration in time range)     IMPRESSION / MDM / ASSESSMENT AND PLAN / ED COURSE  I reviewed the triage vital  signs and the nursing notes.  Patient's presentation is most consistent with acute presentation with potential threat to life or bodily function.  Patient presents to the emergency department for hypotension weakness and left-sided deficits.  Symptoms seem to have started last night.  I reviewed the patient's discharge summary from January 2023 at that time patient presented very similarly with some confusion and left-sided deficits, at that time patient received TNK and was admitted to the intensive care unit.  However MRI later showed no acute CVA, symptoms improved and the patient ultimately was discharged home.  Given the patient's reported hypotension earlier today it is possible that the patient is experiencing intermittent neurologic symptoms due to her hypotension.  However given the left arm and leg deficits we will proceed with CT imaging including CTA imaging to rule out LVO we will  check labs we will IV hydrate and continue to closely monitor.  Patient agreeable to plan of care and workup.  Patient's lab work is reassuring negative troponin reassuring CBC reassuring chemistry.  Patient CTA however does show progressing atherosclerosis of the right ICA now severe stenosis compared to last year.  I suspect with the patient's intermittent hypotension she is likely experiencing right-sided ischemia leading to intermittent left-sided weakness.  Patient states her weakness is improving but still present.  Blood pressure currently 125/55.  I spoke to Dr. Gilda Crease who states the patient could possibly be a candidate for ICA stenting during this admission.  We will discuss with the hospitalist for admission.  FINAL CLINICAL IMPRESSION(S) / ED DIAGNOSES   Weakness Internal carotid stenosis  Note:  This document was prepared using Dragon voice recognition software and may include unintentional dictation errors.   Minna Antis, MD 06/23/23 1318

## 2023-06-23 NOTE — Assessment & Plan Note (Signed)
Memantine 10 mg p.o. twice daily, donepezil 10 mg nightly resumed

## 2023-06-23 NOTE — H&P (Signed)
History and Physical   Kathy Baldwin KVQ:[id number] DOB: 05-28-1946 DOA: 06/23/2023  PCP: [name], MD  Outpatient Specialists: Dr. [facility] cardiology Patient coming from: Home via EMS  I have personally briefly reviewed patient's old medical records in [facility] EMR.  Chief Concern: Left-sided weakness  HPI: Kathy Baldwin is a 77 year old female with history of hyperlipidemia, hypertension, GERD, OSA on CPAP, history of memory deficit/mild dementia, who presents to the emergency department for chief concerns of altered mental status and left-sided weakness.  Vitals in the ED showed temperature of 97.9, respiration rate of 15, heart rate of 55, blood pressure 119/49, SpO2 93% on room air.  Serum sodium is 138, potassium 3.7, chloride 105, bicarb 25, BUN of 24, serum creatinine 1.13, EGFR 50, nonfasting blood glucose 115, WBC 9.8, hemoglobin 12.4, platelets of 226.  T. bili was 1.3.  High sensitive troponin was 8 and on repeat was 9.  CTA head and neck w and wo CM: Was read as negative for large vessel occlusion.  Chronic atherosclerosis appears progressed at the right ICA siphon supraclinoid segment since last year, now moderate to severe stenosis there.  No other significant stenosis on CTA of the head and neck.  Aortic atherosclerosis.  Chronic small vessel disease, chronic small cerebellar infarcts.  ED treatment: None -------------------------------- At bedside, patient is able to tell me her name, age, current location, current calendar year.   Patient reports she felt off last night including chills.  This morning upon waking up, she had difficulty with moving her left arm and legs.  Patient reports the left sided weakness is persistent and has not improved since ED presentation.  She reports this feels worse than in January 2023.   She denies chest pain, shortness of breath, abdominal pain, dysuria, hematuria, diarrhea, vomiting.  Social  history: She is at home on her own.  She denies tobacco, EtOH, recreational drug use.  ROS: Constitutional: no weight change, no fever ENT/Mouth: no sore throat, no rhinorrhea Eyes: no eye pain, no vision changes Cardiovascular: no chest pain, no dyspnea,  no edema, no palpitations Respiratory: no cough, no sputum, no wheezing Gastrointestinal: no nausea, no vomiting, no diarrhea, no constipation Genitourinary: no urinary incontinence, no dysuria, no hematuria Musculoskeletal: no arthralgias, no myalgias Skin: no skin lesions, no pruritus, Neuro: + weakness on left side of her body, no loss of consciousness, no syncope Psych: no anxiety, no depression, no decrease appetite Heme/Lymph: no bruising, no bleeding  ED Course: Discussed with emergency medicine provider, patient requiring hospitalization for chief concerns of TIA/stroke.  Assessment/Plan  Principal Problem:   Stroke-like symptom Active Problems:   Hyperlipidemia   Hypertension   GERD (gastroesophageal reflux disease)   OSA on CPAP   Decreased hearing, bilateral   Aortic atherosclerosis (HCC)   DNR no code (do not resuscitate)   History of cerebellar stroke   Carotid stenosis, bilateral   Mild dementia (HCC)   Bradycardia   MDD (major depressive disorder), single episode, mild (HCC)   Dysuria   Assessment and Plan:  * Stroke-like symptom Small acute right basal ganglia infarct.  Complete echo Neurology has been consulted, appreciate further recommendation and guidance Fasting lipid and A1c ordered Permissive hypertension, pending MRI Frequent neuro vascular checks PT, OT Fall precaution  Dysuria Check UA, POA  MDD (major depressive disorder), single episode, mild (HCC) Bupropion 100 mg daily resumed  Mild dementia (HCC) Memantine 10 mg p.o. twice daily, donepezil 10 mg nightly resumed  History  of cerebellar stroke Aspirin 81 mg, Plavix 75 mg daily resumed  Hyperlipidemia Rosuvastatin 10 mg  nightly resumed  OSA on CPAP CPAP nightly ordered  GERD (gastroesophageal reflux disease) To Dean 20 mg p.o. twice daily resumed, pantoprazole 40 mg every other day resumed  Hypertension Hydralazine 5 mg IV every 4 hours as needed for SBP greater than 190/100  Chart reviewed.   DVT prophylaxis: TED hose Code Status: DNR/DNI Diet: Heart healthy Family Communication: Updated sister, Elease Hashimoto at bedside with patient's permission Disposition Plan: Pending clinical course Consults called: EDP consulted vascular and neurology.  Neurology will consult if MRI is positive for stroke. 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 Holly Bodily 2010   DNR no code (do not resuscitate) 03/20/2017   GERD (gastroesophageal reflux disease)    Hearing loss    History of gallstones    Hyperlipidemia    Hypertension    Hypertrophy of nasal turbinates    Leg mass, left    in calf muscle seeing surgeon tomorrow regarding   Lymphocytosis    Menopause age 39   Multiple food allergies    chocolate and cabbage   OSA on CPAP    Osteopenia Oct. 2014   Sleep apnea    Stroke (HCC) 09/11/2021   Thyroid nodule    Vertigo    Vitamin D deficiency disease    Past Surgical History:  Procedure Laterality Date   BREAST CYST ASPIRATION Right    negative   CARPAL TUNNEL RELEASE Right 2015   CATARACT EXTRACTION W/PHACO Left 09/30/2018   Procedure: CATARACT EXTRACTION PHACO AND INTRAOCULAR LENS PLACEMENT (IOC)  LEFT;  Surgeon: Lockie Mola, MD;  Location: [facility];  Service: Ophthalmology;  Laterality: Left;  sleep apnea   CATARACT EXTRACTION W/PHACO Right 10/28/2018   Procedure: CATARACT  EXTRACTION PHACO AND INTRAOCULAR LENS PLACEMENT (IOC)  RIGHT;  Surgeon: Lockie Mola, MD;  Location: [facility];  Service: Ophthalmology;  Laterality: Right;   CHOLECYSTECTOMY  1990   COLONOSCOPY  04/25/2008   CYST EXCISION Left 2020   L popliteal/peroneal cyst excision s/p residual numbness   DILATION AND CURETTAGE OF UTERUS  1982   s/p miscarriage   ESOPHAGOGASTRODUODENOSCOPY  08/2020   chronic gastritis, neg H pylori, no Barretts, no rpt needed [facility])   Social History:  reports that she has never smoked. She has never used smokeless tobacco. She reports that she does not currently use alcohol. She reports that she does not use drugs.  Allergies  Allergen Reactions   Cabbage Other (See Comments)    Hypersensitivity on allergy testing   Chocolate Other (See Comments)    Hypersensitivity on allergy testing   Statins Other (See Comments)    Zocor and Pravastatin--leg cramps Other reaction(s): Other (See Comments) Zocor and Pravastatin--leg cramps   Family History  Problem Relation Age of Onset   Healthy Mother    Arthritis Mother    Lung disease Father        black lung   Heart disease Father    Hyperlipidemia Father    Hypertension Father    Emphysema Sister    Healthy Sister    Cancer Brother  unsure of type   Asthma Daughter    Heart disease Sister    Thyroid disease Sister    Stroke Maternal Grandmother    Hypertension Maternal Grandmother    Diabetes Maternal Grandmother    Diabetes Sister    Healthy Sister    Breast cancer Neg Hx    Family history: Family history reviewed and not pertinent.  Prior to Admission medications   Medication Sig Start Date End Date Taking? Authorizing Provider  amLODipine (NORVASC) 5 MG tablet Take 0.5 tablets (2.5 mg total) by mouth daily. 06/02/23   [name], MD  aspirin EC 81 MG EC tablet Take 1 tablet (81 mg total) by mouth daily. Swallow whole. 09/13/21   Alford Highland, MD  buPROPion ER  (WELLBUTRIN SR) 100 MG 12 hr tablet Take 1 tablet (100 mg total) by mouth daily. 05/02/23   [name], MD  Calcium Carb-Cholecalciferol (CALCIUM 600+D) 600-800 MG-UNIT TABS Take by mouth daily. 2 capsule daily    [provider]  clopidogrel (PLAVIX) 75 MG tablet Take 1 tablet (75 mg total) by mouth daily. 09/13/21   Alford Highland, MD  COLLAGEN PO Take by mouth.    [provider]  conjugated estrogens (PREMARIN) vaginal cream Use a pea-sized amount externally and internally twice a week at night Patient not taking: Reported on 06/23/2023 04/17/23   [name], MD  Cyanocobalamin (B-12) 2500 MCG TABS Take 1 tablet by mouth daily. Pt taking 1/2 of a 5000 mcg tablet    [provider]  donepezil (ARICEPT) 10 MG tablet Take 10 mg by mouth at bedtime.  02/16/19   [provider]  famotidine (PEPCID) 20 MG tablet Take 1 tablet (20 mg total) by mouth 2 (two) times daily. For heartburn/indigestion 05/02/23   [name], MD  FIBER PO Take by mouth.    [provider]  fluticasone (FLONASE) 50 MCG/ACT nasal spray instill TWO SPRAYS in each nostril daily (shake gently) 06/07/22   [name], MD  fluticasone-salmeterol [facility] INHUB) 250-50 MCG/ACT AEPB Inhale 1 puff into the lungs in the morning and at bedtime. 05/02/23   [name], MD  gabapentin (NEURONTIN) 100 MG capsule Take 1 capsule (100 mg total) by mouth in the morning. 06/02/23   [name], MD  meclizine (ANTIVERT) 25 MG tablet Take 1 tablet (25 mg total) by mouth 3 (three) times daily as needed for dizziness. 11/06/21   [name], MD  memantine (NAMENDA) 10 MG tablet Take 10 mg by mouth 2 (two) times daily. 03/29/20   [provider]  mirabegron ER (MYRBETRIQ) 25 MG TB24 tablet Take 1 tablet (25 mg total) by mouth daily. 05/02/23   [name], MD  Multiple Vitamin (MULTIVITAMIN) tablet Take 1 tablet by mouth daily.    [provider]   pantoprazole (PROTONIX) 40 MG tablet Take 1 tablet (40 mg total) by mouth every other day. 06/02/23   [name], MD  PEDIASURE/FIBER (PEDIASURE/FIBER) LIQD Take 10 mLs by mouth.    [provider]  polyethylene glycol (MIRALAX / GLYCOLAX) 17 g packet Take 8.5-17 g by mouth daily as needed. 04/18/22   [name], MD  rosuvastatin (CRESTOR) 10 MG tablet TAKE 1 TABLET BY MOUTH AT BEDTIME 05/02/23   [name], MD   Physical Exam: Vitals:   06/23/23 1200 06/23/23 1359 06/23/23 1500 06/23/23 1941  BP: (!) 125/55 (!) 126/51 127/62 (!) 127/49  Pulse: 65 67 (!) 58 65  Resp: (!) 21 18 19 18   Temp:  98 F (36.7 C) 98.2 F (36.8 C) 98.6 F (37 C)  TempSrc:  Oral Oral   SpO2: (!) 85% 96% 94% 93%  Weight:      Height:       Constitutional: appears appropriate, NAD, calm Eyes: PERRL, lids and conjunctivae normal ENMT: Mucous membranes are moist. Posterior pharynx clear of any exudate or lesions. Age-appropriate dentition. Hearing appropriate Neck: normal, supple, no masses, no thyromegaly Respiratory: clear to auscultation bilaterally, no wheezing, no crackles. Normal respiratory effort. No accessory muscle use.  Cardiovascular: Regular rate and rhythm, no murmurs / rubs / gallops. No extremity edema. 2+ pedal pulses. No carotid bruits.  Abdomen: no tenderness, no masses palpated, no hepatosplenomegaly. Bowel sounds positive.  Musculoskeletal: no clubbing / cyanosis. No joint deformity upper and lower extremities. Good ROM, no contractures, no atrophy. Normal muscle tone.  Skin: no rashes, lesions, ulcers. No induration Neurologic: Sensation intact. Strength 2/5 on left upper and lower extremities.  Strength is 5/5 in right upper and lower extremities. Psychiatric: Normal judgment and insight. Alert and oriented x 3. Normal mood.   EKG: independently reviewed, showing sinus rhythm with rate of 57, QTc 436  Chest x-ray on Admission: I personally reviewed and I agree  with radiologist reading as below.  MR BRAIN WO CONTRAST  Result Date: 06/23/2023 CLINICAL DATA:  Neuro deficit, acute, stroke suspected. Left sided weakness. EXAM: MRI HEAD WITHOUT CONTRAST TECHNIQUE: Multiplanar, multiecho pulse sequences of the brain and surrounding structures were obtained without intravenous contrast. COMPARISON:  CTA head and neck 06/23/2023.  MRI head 09/11/2021. FINDINGS: Brain: There is a 5 mm acute infarct at the superior aspect of the right lentiform nucleus. Patchy T2 hyperintensities in the cerebral white matter and pons have likely mildly progressed from the prior MRI and are nonspecific but compatible with moderate chronic small vessel ischemic disease. There are small chronic bilateral cerebellar infarcts which have slightly increased in number from the prior MRI. There is a small amount of hemosiderin deposition associated with an inferior left cerebellar infarct. There is mild generalized cerebral atrophy. No mass, midline shift, or extra-axial fluid collection is identified. Vascular: Major intracranial vascular flow voids are preserved. Skull and upper cervical spine: Unremarkable bone marrow signal. Sinuses/Orbits: Bilateral cataract extraction. Paranasal sinuses and mastoid air cells are clear. Other: None. IMPRESSION: 1. Small acute right basal ganglia infarct. 2. Moderate chronic small vessel ischemic disease. 3. Chronic cerebellar infarcts. Electronically Signed   By: Sebastian Ache M.D.   On: 06/23/2023 17:10   CT ANGIO HEAD NECK W WO CM  Result Date: 06/23/2023 CLINICAL DATA:  77 year old female with neurologic deficit, abnormal sensation, hypotensive at presentation. EXAM: CT ANGIOGRAPHY HEAD AND NECK WITH AND WITHOUT CONTRAST TECHNIQUE: Multidetector CT imaging of the head and neck was performed using the standard protocol during bolus administration of intravenous contrast. Multiplanar CT image reconstructions and MIPs were obtained to evaluate the vascular  anatomy. Carotid stenosis measurements (when applicable) are obtained utilizing NASCET criteria, using the distal internal carotid diameter as the denominator. RADIATION DOSE REDUCTION: This exam was performed according to the departmental dose-optimization program which includes automated exposure control, adjustment of the mA and/or kV according to patient size and/or use of iterative reconstruction technique. CONTRAST:  75mL OMNIPAQUE IOHEXOL 350 MG/ML SOLN COMPARISON:  Brain MRI 09/11/2021. Head CT 09/12/2021. CTA head and neck 09/11/2021. FINDINGS: CT HEAD Brain: Stable cerebral volume. No midline shift, ventriculomegaly, mass effect, evidence of mass lesion, intracranial hemorrhage or evidence of cortically based acute infarction.  Small chronic bilateral cerebellar infarcts. Patchy and confluent cerebral white matter hypodensity appears stable. Calvarium and skull base: No acute osseous abnormality identified. Paranasal sinuses: Visualized paranasal sinuses and mastoids are stable and well aerated. Orbits: No acute orbit or scalp soft tissue finding. CTA NECK Skeleton: Widespread chronic cervical spine degeneration. No acute osseous abnormality identified. Upper chest: Chronic mosaic attenuation in the upper lungs is stable. Other neck: Chronic thyroid heterogeneity This has been evaluated on previous imaging. (ref: J Am Coll Radiol. 2015 Feb;12(2): 143-50).Chronic partially retropharyngeal course of the right carotid. Aortic arch: Calcified aortic atherosclerosis.  3 vessel arch. Right carotid system: No brachiocephalic or proximal right CCA plaque or stenosis. Tortuous right CCA origin. Moderate calcified plaque at the right ICA origin and bulb results in 50 % stenosis with respect to the distal vessel, stable. Left carotid system: No left CCA plaque or stenosis. Mild calcified plaque at the left ICA origin and bulb without stenosis. Tortuous left ICA in the upper neck. Vertebral arteries: Proximal right  subclavian and right vertebral artery origin plaque without stenosis appears stable. Mildly non dominant right vertebral artery is patent and stable to the skull base without stenosis. Proximal left subclavian calcified plaque with less than 50 % stenosis with respect to the distal vessel. Chronic calcified plaque at the left vertebral artery origin but only mild origin stenosis, stable since last year. Dominant left vertebral artery is patent and stable to the skull base without additional stenosis. CTA HEAD Posterior circulation: Distal vertebral arteries and vertebrobasilar junction remain patent without plaque or stenosis. Dominant left V4. Patent PICA origins. Patent basilar artery without stenosis. Normal SCA and PCA origins. Posterior communicating arteries are diminutive or absent. Bilateral PCAs are stable, within normal limits. Anterior circulation: Both ICA siphons are patent. Left siphon calcified plaque with mild supraclinoid stenosis is stable. Right siphon heavy supraclinoid calcified plaque with moderate to severe stenosis appears progressed since last year on series 10, image 234. Both carotid termini remain patent. Left ACA A1 segment is diminutive or absent. Right A1, anterior communicating artery, bilateral ACA branches are within normal limits. Left MCA M1 segment and bifurcation are patent without stenosis. Right MCA M1 segment and trifurcation are patent without stenosis. Bilateral MCA branches are stable and within normal limits. Venous sinuses: Patent. Anatomic variants: Dominant left vertebral artery. Dominant right and diminutive or absent left ACA A1 segments. Review of the MIP images confirms the above findings IMPRESSION: 1. Negative for large vessel occlusion. But chronic atherosclerosis appears progressed at the Right ICA siphon supraclinoid segment since last year, now with Moderate to Severe stenosis there. 2. No other significant stenosis on CTA head and neck. Aortic  Atherosclerosis (ICD10-I70.0). 3. Stable CT appearance of the chronic small vessel disease, chronic small cerebellar infarcts. Electronically Signed   By: Odessa Fleming M.D.   On: 06/23/2023 11:29    Labs on Admission: I have personally reviewed following labs  CBC: Recent Labs  Lab 06/23/23 0831  WBC 9.8  HGB 12.4  HCT 37.9  MCV 93.3  PLT 226   Basic Metabolic Panel: Recent Labs  Lab 06/23/23 0831  NA 138  K 3.7  CL 105  CO2 25  GLUCOSE 115*  BUN 24*  CREATININE 1.13*  CALCIUM 8.9   GFR: Estimated Creatinine Clearance: 35.8 mL/min (A) (by C-G formula based on SCr of 1.13 mg/dL (H)).  Liver Function Tests: Recent Labs  Lab 06/23/23 0831  AST 25  ALT 14  ALKPHOS 73  BILITOT 1.3*  PROT 6.5  ALBUMIN 3.7   Urine analysis:    Component Value Date/Time   COLORURINE YELLOW 02/07/2023 1115   APPEARANCEUR CLOUDY (A) 02/07/2023 1115   LABSPEC 1.020 02/07/2023 1115   PHURINE 5.5 02/07/2023 1115   GLUCOSEU NEGATIVE 02/07/2023 1115   HGBUR MODERATE (A) 02/07/2023 1115   BILIRUBINUR neg 03/25/2023 1455   KETONESUR NEGATIVE 02/07/2023 1115   PROTEINUR Negative 03/25/2023 1455   UROBILINOGEN 0.2 03/25/2023 1455   UROBILINOGEN 0.2 02/07/2023 1115   NITRITE neg 03/25/2023 1455   NITRITE NEGATIVE 02/07/2023 1115   LEUKOCYTESUR Moderate (2+) (A) 03/25/2023 1455   LEUKOCYTESUR MODERATE (A) 02/07/2023 1115   This document was prepared using Dragon Voice Recognition software and may include unintentional dictation errors.  Dr. Sedalia Muta [facility]  If 7PM-7AM, please contact overnight-coverage provider If 7AM-7PM, please contact day attending provider [email or web address]  06/23/2023, 9:04 PM

## 2023-06-24 ENCOUNTER — Observation Stay (HOSPITAL_COMMUNITY)
Admit: 2023-06-24 | Discharge: 2023-06-24 | Disposition: A | Discharge status: Home / Self Care | Payer: PPO | Attending: Internal Medicine | Admitting: Internal Medicine

## 2023-06-24 DIAGNOSIS — I639 Cerebral infarction, unspecified: Secondary | ICD-10-CM | POA: Diagnosis present

## 2023-06-24 DIAGNOSIS — I6521 Occlusion and stenosis of right carotid artery: Secondary | ICD-10-CM | POA: Diagnosis not present

## 2023-06-24 DIAGNOSIS — F03A3 Unspecified dementia, mild, with mood disturbance: Secondary | ICD-10-CM | POA: Diagnosis present

## 2023-06-24 DIAGNOSIS — S62102S Fracture of unspecified carpal bone, left wrist, sequela: Secondary | ICD-10-CM | POA: Diagnosis not present

## 2023-06-24 DIAGNOSIS — I959 Hypotension, unspecified: Secondary | ICD-10-CM | POA: Diagnosis present

## 2023-06-24 DIAGNOSIS — Z79899 Other long term (current) drug therapy: Secondary | ICD-10-CM | POA: Diagnosis not present

## 2023-06-24 DIAGNOSIS — I6389 Other cerebral infarction: Secondary | ICD-10-CM

## 2023-06-24 DIAGNOSIS — R531 Weakness: Secondary | ICD-10-CM

## 2023-06-24 DIAGNOSIS — R29705 NIHSS score 5: Secondary | ICD-10-CM | POA: Diagnosis present

## 2023-06-24 DIAGNOSIS — J309 Allergic rhinitis, unspecified: Secondary | ICD-10-CM | POA: Diagnosis present

## 2023-06-24 DIAGNOSIS — I6523 Occlusion and stenosis of bilateral carotid arteries: Secondary | ICD-10-CM

## 2023-06-24 DIAGNOSIS — Z823 Family history of stroke: Secondary | ICD-10-CM | POA: Diagnosis not present

## 2023-06-24 DIAGNOSIS — R001 Bradycardia, unspecified: Secondary | ICD-10-CM | POA: Diagnosis present

## 2023-06-24 DIAGNOSIS — I10 Essential (primary) hypertension: Secondary | ICD-10-CM | POA: Diagnosis present

## 2023-06-24 DIAGNOSIS — M1000 Idiopathic gout, unspecified site: Secondary | ICD-10-CM | POA: Diagnosis not present

## 2023-06-24 DIAGNOSIS — M810 Age-related osteoporosis without current pathological fracture: Secondary | ICD-10-CM | POA: Diagnosis present

## 2023-06-24 DIAGNOSIS — W19XXXA Unspecified fall, initial encounter: Secondary | ICD-10-CM | POA: Diagnosis present

## 2023-06-24 DIAGNOSIS — I6381 Other cerebral infarction due to occlusion or stenosis of small artery: Secondary | ICD-10-CM | POA: Diagnosis present

## 2023-06-24 DIAGNOSIS — S62113A Displaced fracture of triquetrum [cuneiform] bone, unspecified wrist, initial encounter for closed fracture: Secondary | ICD-10-CM | POA: Diagnosis present

## 2023-06-24 DIAGNOSIS — M109 Gout, unspecified: Secondary | ICD-10-CM

## 2023-06-24 DIAGNOSIS — Z961 Presence of intraocular lens: Secondary | ICD-10-CM | POA: Diagnosis present

## 2023-06-24 DIAGNOSIS — R300 Dysuria: Secondary | ICD-10-CM | POA: Diagnosis present

## 2023-06-24 DIAGNOSIS — H9190 Unspecified hearing loss, unspecified ear: Secondary | ICD-10-CM | POA: Diagnosis present

## 2023-06-24 DIAGNOSIS — R2990 Unspecified symptoms and signs involving the nervous system: Secondary | ICD-10-CM | POA: Diagnosis not present

## 2023-06-24 DIAGNOSIS — E785 Hyperlipidemia, unspecified: Secondary | ICD-10-CM | POA: Diagnosis present

## 2023-06-24 DIAGNOSIS — F320 Major depressive disorder, single episode, mild: Secondary | ICD-10-CM | POA: Diagnosis present

## 2023-06-24 DIAGNOSIS — Z8249 Family history of ischemic heart disease and other diseases of the circulatory system: Secondary | ICD-10-CM | POA: Diagnosis not present

## 2023-06-24 DIAGNOSIS — G8194 Hemiplegia, unspecified affecting left nondominant side: Secondary | ICD-10-CM | POA: Diagnosis present

## 2023-06-24 DIAGNOSIS — R4701 Aphasia: Secondary | ICD-10-CM | POA: Diagnosis present

## 2023-06-24 DIAGNOSIS — K219 Gastro-esophageal reflux disease without esophagitis: Secondary | ICD-10-CM | POA: Diagnosis present

## 2023-06-24 DIAGNOSIS — I700 Atherosclerosis of aorta: Secondary | ICD-10-CM | POA: Diagnosis present

## 2023-06-24 DIAGNOSIS — Z66 Do not resuscitate: Secondary | ICD-10-CM | POA: Diagnosis present

## 2023-06-24 DIAGNOSIS — G4733 Obstructive sleep apnea (adult) (pediatric): Secondary | ICD-10-CM | POA: Diagnosis present

## 2023-06-24 DIAGNOSIS — Z7982 Long term (current) use of aspirin: Secondary | ICD-10-CM | POA: Diagnosis not present

## 2023-06-24 LAB — LIPID PANEL
Cholesterol: 129 mg/dL (ref 0–200)
HDL: 63 mg/dL (ref >40)
LDL Cholesterol: 56 mg/dL (ref 0–99)
Total CHOL/HDL Ratio: 2 {ratio}
Triglycerides: 48 mg/dL (ref <150)
VLDL: 10 mg/dL (ref 0–40)

## 2023-06-24 LAB — HEMOGLOBIN A1C
Hgb A1c MFr Bld: 5.7 % — ABNORMAL HIGH (ref 4.8–5.6)
Mean Plasma Glucose: 116.89 mg/dL

## 2023-06-24 MED ORDER — PREDNISONE 20 MG PO TABS
20.0000 mg | ORAL_TABLET | Freq: Every day | ORAL | Status: DC
  Administered 2023-06-24 – 2023-06-25 (×2): 20 mg via ORAL
  Filled 2023-06-24 (×2): qty 1

## 2023-06-24 NOTE — Progress Notes (Signed)
MRN : [id number]  Kathy Baldwin is a 77 y.o. ([birth year]) female who presents with chief complaint of check circulation.  History of Present Illness:   Patient sitting up in bed alert upon my entering.  She appears to have finished her supper.  She denies any pain at this time.  She does feel that her arm is gotten stronger today but is still worried about her left leg.  Current Meds  Medication Sig   amLODipine (NORVASC) 5 MG tablet Take 0.5 tablets (2.5 mg total) by mouth daily.   aspirin EC 81 MG EC tablet Take 1 tablet (81 mg total) by mouth daily. Swallow whole.   buPROPion ER (WELLBUTRIN SR) 100 MG 12 hr tablet Take 1 tablet (100 mg total) by mouth daily.   Calcium Carb-Cholecalciferol (CALCIUM 600+D) 600-800 MG-UNIT TABS Take by mouth daily. 2 capsule daily   clopidogrel (PLAVIX) 75 MG tablet Take 1 tablet (75 mg total) by mouth daily.   COLLAGEN PO Take by mouth.   Cyanocobalamin (B-12) 2500 MCG TABS Take 1 tablet by mouth daily. Pt taking 1/2 of a 5000 mcg tablet   donepezil (ARICEPT) 10 MG tablet Take 10 mg by mouth at bedtime.    famotidine (PEPCID) 20 MG tablet Take 1 tablet (20 mg total) by mouth 2 (two) times daily. For heartburn/indigestion   FIBER PO Take by mouth.   fluticasone (FLONASE) 50 MCG/ACT nasal spray instill TWO SPRAYS in each nostril daily (shake gently)   fluticasone-salmeterol (WIXELA INHUB) 250-50 MCG/ACT AEPB Inhale 1 puff into the lungs in the morning and at bedtime.   gabapentin (NEURONTIN) 100 MG capsule Take 1 capsule (100 mg total) by mouth in the morning.   meclizine (ANTIVERT) 25 MG tablet Take 1 tablet (25 mg total) by mouth 3 (three) times daily as needed for dizziness.   memantine (NAMENDA) 10 MG tablet Take 10 mg by mouth 2 (two) times daily.   mirabegron ER (MYRBETRIQ) 25 MG TB24 tablet Take 1 tablet (25 mg total) by mouth daily.   Multiple Vitamin (MULTIVITAMIN)  tablet Take 1 tablet by mouth daily.   pantoprazole (PROTONIX) 40 MG tablet Take 40 mg by mouth daily.   polyethylene glycol (MIRALAX / GLYCOLAX) 17 g packet Take 8.5-17 g by mouth daily as needed.   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 Holly Bodily 2010   DNR no code (do not resuscitate) 03/20/2017   GERD (gastroesophageal reflux disease)    Hearing loss    History of gallstones    Hyperlipidemia    Hypertension    Hypertrophy of nasal turbinates    Leg mass, left    in calf muscle seeing surgeon tomorrow regarding   Lymphocytosis  Menopause age 30   Multiple food allergies    chocolate and cabbage   OSA on CPAP    Osteopenia Oct. 2014   Sleep apnea    Stroke (HCC) 09/11/2021   Thyroid nodule    Vertigo    Vitamin D deficiency disease     Past Surgical History:  Procedure Laterality Date   BREAST CYST ASPIRATION Right    negative   CARPAL TUNNEL RELEASE Right 2015   CATARACT EXTRACTION W/PHACO Left 09/30/2018   Procedure: CATARACT EXTRACTION PHACO AND INTRAOCULAR LENS PLACEMENT (IOC)  LEFT;  Surgeon: Lockie Mola, MD;  Location[facility];  Service: Ophthalmology;  Laterality: Left;  sleep apnea   CATARACT EXTRACTION W/PHACO Right 10/28/2018   Procedure: CATARACT EXTRACTION PHACO AND INTRAOCULAR LENS PLACEMENT (IOC)  RIGHT;  Surgeon: Lockie Mola, MD;  Location: [facility];  Service: Ophthalmology;  Laterality: Right;   CHOLECYSTECTOMY  1990   COLONOSCOPY  04/25/2008   CYST EXCISION Left 2020   L popliteal/peroneal cyst excision s/p residual numbness   DILATION AND CURETTAGE OF UTERUS  1982   s/p miscarriage    ESOPHAGOGASTRODUODENOSCOPY  08/2020   chronic gastritis, neg H pylori, no Barretts, no rpt needed [facility])    Social History Social History   Tobacco Use   Smoking status: Never   Smokeless tobacco: Never   Tobacco comments:    Never smoked, so cannot quit...  Vaping Use   Vaping status: Never Used  Substance Use Topics   Alcohol use: Not Currently    Alcohol/week: 0.0 standard drinks of alcohol    Comment: Occasional celebratory drink (holidays, etc.) only.   Drug use: No    Family History Family History  Problem Relation Age of Onset   Healthy Mother    Arthritis Mother    Lung disease Father        black lung   Heart disease Father    Hyperlipidemia Father    Hypertension Father    Emphysema Sister    Healthy Sister    Cancer Brother        unsure of type   Asthma Daughter    Heart disease Sister    Thyroid disease Sister    Stroke Maternal Grandmother    Hypertension Maternal Grandmother    Diabetes Maternal Grandmother    Diabetes Sister    Healthy Sister    Breast cancer Neg Hx     Allergies  Allergen Reactions   Cabbage Other (See Comments)    Hypersensitivity on allergy testing   Chocolate Other (See Comments)    Hypersensitivity on allergy testing   Statins Other (See Comments)    Zocor and Pravastatin--leg cramps Other reaction(s): Other (See Comments) Zocor and Pravastatin--leg cramps     REVIEW OF SYSTEMS (Negative unless checked)  Constitutional: [] Weight loss  [] Fever  [] Chills Cardiac: [] Chest pain   [] Chest pressure   [] Palpitations   [] Shortness of breath when laying flat   [] Shortness of breath with exertion. Vascular:  [x] Pain in legs with walking   [] Pain in legs at rest  [] History of DVT   [] Phlebitis   [] Swelling in legs   [] Varicose veins   [] Non-healing ulcers Pulmonary:   [] Uses home oxygen   [] Productive cough   [] Hemoptysis   [] Wheeze  [] COPD   [] Asthma Neurologic:  [] Dizziness   [] Seizures   [x] History of stroke   [] History  of TIA  [] Aphasia   [] Vissual changes   [x] Weakness or numbness in arm   [  x]Weakness or numbness in leg Musculoskeletal:   [] Joint swelling   [] Joint pain   [] Low back pain Hematologic:  [] Easy bruising  [] Easy bleeding   [] Hypercoagulable state   [] Anemic Gastrointestinal:  [] Diarrhea   [] Vomiting  [] Gastroesophageal reflux/heartburn   [] Difficulty swallowing. Genitourinary:  [] Chronic kidney disease   [] Difficult urination  [] Frequent urination   [] Blood in urine Skin:  [] Rashes   [] Ulcers  Psychological:  [] History of anxiety   []  History of major depression.  Physical Examination  Vitals:   06/24/23 0537 06/24/23 0831 06/24/23 1146 06/24/23 1557  BP: (!) 121/44 (!) 134/56 (!) 136/59 137/61  Pulse: 72 (!) 58 68 81  Resp: 17 16 16 18   Temp: 98.1 F (36.7 C) 97.6 F (36.4 C) 99.4 F (37.4 C) 99.9 F (37.7 C)  TempSrc:  Oral    SpO2: 94% 96% 95% 92%  Weight:      Height:       Body mass index is 25.4 kg/m. Gen: WD/WN, NAD Head: [place]/AT, No temporalis wasting.  Ear/Nose/Throat: Hearing grossly intact, nares w/o erythema or drainage Eyes: PER, EOMI, sclera nonicteric.  Neck: Supple, no masses.  No bruit or JVD.  Pulmonary:  Good air movement, no audible wheezing, no use of accessory muscles.  Cardiac: RRR, normal S1, S2, no Murmurs. Vascular:  mild trophic changes, no open wounds Vessel Right Left  Radial Palpable Palpable  Gastrointestinal: soft, non-distended. No guarding/no peritoneal signs.  Musculoskeletal: M/S 5/5 on the right mild weakness of the left arm moderate weakness of the left leg.  No visible deformity.  Neurologic: CN 2-12 intact. Speech is very mild aphasia. Motor exam as listed above. Psychiatric: Judgment intact, Mood & affect appropriate for pt's clinical situation. Dermatologic: No rashes or ulcers noted.  No changes consistent with cellulitis.   CBC Lab Results  Component Value Date   WBC 9.8 06/23/2023   HGB 12.4 06/23/2023   HCT 37.9 06/23/2023    MCV 93.3 06/23/2023   PLT 226 06/23/2023    BMET    Component Value Date/Time   NA 138 06/23/2023 0831   NA 143 12/04/2015 0851   K 3.7 06/23/2023 0831   CL 105 06/23/2023 0831   CO2 25 06/23/2023 0831   GLUCOSE 115 (H) 06/23/2023 0831   BUN 24 (H) 06/23/2023 0831   BUN 11 12/04/2015 0851   CREATININE 1.13 (H) 06/23/2023 0831   CREATININE 1.10 (H) 05/02/2023 1458   CALCIUM 8.9 06/23/2023 0831   GFRNONAA 50 (L) 06/23/2023 0831   GFRNONAA 75 10/08/2018 1156   GFRAA 87 10/08/2018 1156   Estimated Creatinine Clearance: 35.8 mL/min (A) (by C-G formula based on SCr of 1.13 mg/dL (H)).  COAG Lab Results  Component Value Date   INR 1.1 09/11/2021    Radiology DG Wrist 2 Views Left  Result Date: 06/23/2023 CLINICAL DATA:  Wrist pain EXAM: LEFT WRIST - 2 VIEW COMPARISON:  None Available. FINDINGS: There is dorsal wrist soft tissue swelling. There is a thin curvilinear density in this region which may represent soft tissue calcification versus subtle triquetral fracture. No other fractures are visualized. Joint spaces are maintained. There is calcification of the triangular fibrocartilage, degenerative in nature. IMPRESSION: Dorsal wrist soft tissue swelling. Thin curvilinear density in this region may represent soft tissue calcification versus subtle triquetral fracture. Electronically Signed   By: Darliss Cheney M.D.   On: 06/23/2023 21:57   MR BRAIN WO CONTRAST  Result Date: 06/23/2023 CLINICAL DATA:  Neuro deficit, acute, stroke suspected.  Left sided weakness. EXAM: MRI HEAD WITHOUT CONTRAST TECHNIQUE: Multiplanar, multiecho pulse sequences of the brain and surrounding structures were obtained without intravenous contrast. COMPARISON:  CTA head and neck 06/23/2023.  MRI head 09/11/2021. FINDINGS: Brain: There is a 5 mm acute infarct at the superior aspect of the right lentiform nucleus. Patchy T2 hyperintensities in the cerebral white matter and pons have likely mildly progressed from  the prior MRI and are nonspecific but compatible with moderate chronic small vessel ischemic disease. There are small chronic bilateral cerebellar infarcts which have slightly increased in number from the prior MRI. There is a small amount of hemosiderin deposition associated with an inferior left cerebellar infarct. There is mild generalized cerebral atrophy. No mass, midline shift, or extra-axial fluid collection is identified. Vascular: Major intracranial vascular flow voids are preserved. Skull and upper cervical spine: Unremarkable bone marrow signal. Sinuses/Orbits: Bilateral cataract extraction. Paranasal sinuses and mastoid air cells are clear. Other: None. IMPRESSION: 1. Small acute right basal ganglia infarct. 2. Moderate chronic small vessel ischemic disease. 3. Chronic cerebellar infarcts. Electronically Signed   By: Sebastian Ache M.D.   On: 06/23/2023 17:10   CT ANGIO HEAD NECK W WO CM  Result Date: 06/23/2023 CLINICAL DATA:  77 year old female with neurologic deficit, abnormal sensation, hypotensive at presentation. EXAM: CT ANGIOGRAPHY HEAD AND NECK WITH AND WITHOUT CONTRAST TECHNIQUE: Multidetector CT imaging of the head and neck was performed using the standard protocol during bolus administration of intravenous contrast. Multiplanar CT image reconstructions and MIPs were obtained to evaluate the vascular anatomy. Carotid stenosis measurements (when applicable) are obtained utilizing NASCET criteria, using the distal internal carotid diameter as the denominator. RADIATION DOSE REDUCTION: This exam was performed according to the departmental dose-optimization program which includes automated exposure control, adjustment of the mA and/or kV according to patient size and/or use of iterative reconstruction technique. CONTRAST:  75mL OMNIPAQUE IOHEXOL 350 MG/ML SOLN COMPARISON:  Brain MRI 09/11/2021. Head CT 09/12/2021. CTA head and neck 09/11/2021. FINDINGS: CT HEAD Brain: Stable cerebral volume. No  midline shift, ventriculomegaly, mass effect, evidence of mass lesion, intracranial hemorrhage or evidence of cortically based acute infarction. Small chronic bilateral cerebellar infarcts. Patchy and confluent cerebral white matter hypodensity appears stable. Calvarium and skull base: No acute osseous abnormality identified. Paranasal sinuses: Visualized paranasal sinuses and mastoids are stable and well aerated. Orbits: No acute orbit or scalp soft tissue finding. CTA NECK Skeleton: Widespread chronic cervical spine degeneration. No acute osseous abnormality identified. Upper chest: Chronic mosaic attenuation in the upper lungs is stable. Other neck: Chronic thyroid heterogeneity This has been evaluated on previous imaging. (ref: J Am Coll Radiol. 2015 Feb;12(2): 143-50).Chronic partially retropharyngeal course of the right carotid. Aortic arch: Calcified aortic atherosclerosis.  3 vessel arch. Right carotid system: No brachiocephalic or proximal right CCA plaque or stenosis. Tortuous right CCA origin. Moderate calcified plaque at the right ICA origin and bulb results in 50 % stenosis with respect to the distal vessel, stable. Left carotid system: No left CCA plaque or stenosis. Mild calcified plaque at the left ICA origin and bulb without stenosis. Tortuous left ICA in the upper neck. Vertebral arteries: Proximal right subclavian and right vertebral artery origin plaque without stenosis appears stable. Mildly non dominant right vertebral artery is patent and stable to the skull base without stenosis. Proximal left subclavian calcified plaque with less than 50 % stenosis with respect to the distal vessel. Chronic calcified plaque at the left vertebral artery origin but only mild origin stenosis, stable  since last year. Dominant left vertebral artery is patent and stable to the skull base without additional stenosis. CTA HEAD Posterior circulation: Distal vertebral arteries and vertebrobasilar junction remain  patent without plaque or stenosis. Dominant left V4. Patent PICA origins. Patent basilar artery without stenosis. Normal SCA and PCA origins. Posterior communicating arteries are diminutive or absent. Bilateral PCAs are stable, within normal limits. Anterior circulation: Both ICA siphons are patent. Left siphon calcified plaque with mild supraclinoid stenosis is stable. Right siphon heavy supraclinoid calcified plaque with moderate to severe stenosis appears progressed since last year on series 10, image 234. Both carotid termini remain patent. Left ACA A1 segment is diminutive or absent. Right A1, anterior communicating artery, bilateral ACA branches are within normal limits. Left MCA M1 segment and bifurcation are patent without stenosis. Right MCA M1 segment and trifurcation are patent without stenosis. Bilateral MCA branches are stable and within normal limits. Venous sinuses: Patent. Anatomic variants: Dominant left vertebral artery. Dominant right and diminutive or absent left ACA A1 segments. Review of the MIP images confirms the above findings IMPRESSION: 1. Negative for large vessel occlusion. But chronic atherosclerosis appears progressed at the Right ICA siphon supraclinoid segment since last year, now with Moderate to Severe stenosis there. 2. No other significant stenosis on CTA head and neck. Aortic Atherosclerosis (ICD10-I70.0). 3. Stable CT appearance of the chronic small vessel disease, chronic small cerebellar infarcts. Electronically Signed   By: Odessa Fleming M.D.   On: 06/23/2023 11:29   DG Wrist Complete Right  Result Date: 06/02/2023 CLINICAL DATA:  Right wrist pain after fall EXAM: RIGHT WRIST - COMPLETE 3+ VIEW COMPARISON:  None Available. FINDINGS: Curvilinear density dorsal wrist on lateral view with soft tissue swelling. No malalignments. Chondrocalcinosis. Mild arthritis at the first [facility] joint. IMPRESSION: Curvilinear density dorsal wrist on lateral view is indeterminate for soft tissue  calcification versus subtle triquetral fracture. Electronically Signed   By: Jasmine Pang M.D.   On: 06/02/2023 15:55     Assessment/Plan 1.  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  The cervical atherosclerotic occlusive disease of the carotid arteries does not rise to the degree that would require intervention at this time.  I concur with the report that the proximal right internal carotid artery demonstrates approximately 50% stenosis.  However, there is the distal internal carotid artery lesion that does appear to have progressed.  This lesion is located within the siphon.  I would continue dual antiplatelet therapy.  I believe referral to a neurointerventionalist is appropriate and have recommended Dr. Marliss Czar at [facility].  In the acute setting continue supportive care physical therapy and antiplatelet therapy.   Levora Dredge, MD  06/24/2023 6:05 PM

## 2023-06-24 NOTE — Progress Notes (Signed)
*  PRELIMINARY RESULTS* Echocardiogram 2D Echocardiogram has been performed.  Kathy Baldwin 06/24/2023, 8:57 AM

## 2023-06-24 NOTE — Progress Notes (Signed)
Inpatient Rehab Admissions Coordinator:  ? ?Per therapy recommendations,  patient was screened for CIR candidacy by Megan Salon, MS, CCC-SLP. At this time, Pt. Appears to be a a potential candidate for CIR. I will place   order for rehab consult per protocol for full assessment. Please contact me any with questions. ? ?Megan Salon, MS, CCC-SLP ?Rehab Admissions Coordinator  ?[phone] (celll) ?[phone] (office) ? ?

## 2023-06-24 NOTE — Plan of Care (Signed)
  Problem: Education: Goal: Knowledge of disease or condition will improve 06/24/2023 1032 by Wendee Beavers, RN Outcome: Progressing 06/24/2023 1031 by Wendee Beavers, RN Outcome: Progressing Goal: Knowledge of secondary prevention will improve (MUST DOCUMENT ALL) 06/24/2023 1032 by Wendee Beavers, RN Outcome: Progressing 06/24/2023 1031 by Wendee Beavers, RN Outcome: Progressing Goal: Knowledge of patient specific risk factors will improve Loraine Leriche N/A or DELETE if not current risk factor) 06/24/2023 1032 by Wendee Beavers, RN Outcome: Progressing 06/24/2023 1031 by Wendee Beavers, RN Outcome: Progressing   Problem: Ischemic Stroke/TIA Tissue Perfusion: Goal: Complications of ischemic stroke/TIA will be minimized 06/24/2023 1032 by Wendee Beavers, RN Outcome: Progressing 06/24/2023 1031 by Wendee Beavers, RN Outcome: Progressing   Problem: Clinical Measurements: Goal: Ability to maintain clinical measurements within normal limits will improve 06/24/2023 1032 by Wendee Beavers, RN Outcome: Progressing 06/24/2023 1031 by Wendee Beavers, RN Outcome: Progressing Goal: Will remain free from infection 06/24/2023 1032 by Wendee Beavers, RN Outcome: Progressing 06/24/2023 1031 by Wendee Beavers, RN Outcome: Progressing Goal: Diagnostic test results will improve 06/24/2023 1032 by Wendee Beavers, RN Outcome: Progressing 06/24/2023 1031 by Wendee Beavers, RN Outcome: Progressing Goal: Respiratory complications will improve 06/24/2023 1032 by Wendee Beavers, RN Outcome: Progressing 06/24/2023 1031 by Wendee Beavers, RN Outcome: Progressing Goal: Cardiovascular complication will be avoided 06/24/2023 1032 by Wendee Beavers, RN Outcome: Progressing 06/24/2023 1031 by Wendee Beavers, RN Outcome: Progressing   Problem: Activity: Goal: Risk for activity  intolerance will decrease 06/24/2023 1032 by Wendee Beavers, RN Outcome: Progressing 06/24/2023 1031 by Wendee Beavers, RN Outcome: Progressing   Problem: Elimination: Goal: Will not experience complications related to bowel motility 06/24/2023 1032 by Wendee Beavers, RN Outcome: Progressing 06/24/2023 1031 by Wendee Beavers, RN Outcome: Progressing Goal: Will not experience complications related to urinary retention 06/24/2023 1032 by Wendee Beavers, RN Outcome: Progressing 06/24/2023 1031 by Wendee Beavers, RN Outcome: Progressing   Problem: Pain Managment: Goal: General experience of comfort will improve 06/24/2023 1032 by Wendee Beavers, RN Outcome: Progressing 06/24/2023 1031 by Wendee Beavers, RN Outcome: Progressing   Problem: Safety: Goal: Ability to remain free from injury will improve 06/24/2023 1032 by Wendee Beavers, RN Outcome: Progressing 06/24/2023 1031 by Wendee Beavers, RN Outcome: Progressing

## 2023-06-24 NOTE — Progress Notes (Signed)
PROGRESS NOTE  Kathy Baldwin    DOB: November 23, 1945, 77 y.o.  ZOX:[id number]    Code Status: Limited: Do not attempt resuscitation (DNR) -DNR-LIMITED -Do Not Intubate/DNI    DOA: 06/23/2023   LOS: 0   Brief hospital course  Kathy Baldwin is a 77 year old female with history of hyperlipidemia, hypertension, GERD, OSA on CPAP, history of memory deficit/mild dementia. They presented to the ED for chief concerns of altered mental status and left-sided weakness which was present upon waking up. Patient reports the left sided weakness is persistent and has not improved since ED presentation.   ED course: temperature of 97.9, respiration rate of 15, heart rate of 55, blood pressure 119/49, SpO2 93% on room air. Serum sodium is 138, potassium 3.7, chloride 105, bicarb 25, BUN of 24, serum creatinine 1.13, EGFR 50, nonfasting blood glucose 115, WBC 9.8, hemoglobin 12.4, platelets of 226.  T. bili was 1.3.  High sensitive troponin was 8 and on repeat was 9. CTA head and neck w and wo: negative for large vessel occlusion.  Chronic atherosclerosis appears progressed at the right ICA siphon supraclinoid segment since last year, now moderate to severe stenosis there.  No other significant stenosis on CTA of the head and neck.  Aortic atherosclerosis.  Chronic small vessel disease, chronic small cerebellar infarcts. MRI brain: Small acute right basal ganglia infarct. Moderate chronic small vessel ischemic disease. Chronic cerebellar infarcts.  Neurology was consulted.   Patient was admitted to medicine service for further workup and management of stroke as outlined in detail below.  06/24/23 -stable, mild improvement  Assessment & Plan  Principal Problem:   Stroke-like symptom Active Problems:   Hyperlipidemia   Hypertension   GERD (gastroesophageal reflux disease)   OSA on CPAP   Decreased hearing, bilateral   Aortic atherosclerosis (HCC)   DNR no code (do not resuscitate)   History of  cerebellar stroke   Carotid stenosis, bilateral   Mild dementia (HCC)   Bradycardia   MDD (major depressive disorder), single episode, mild (HCC)   Dysuria  Acute CVA- with residual left-sided weakness.  Small acute right basal ganglia infarct seen on brain MRI.  History of cerebellar stroke - neurology consulted, appreciate your recs - PT/OT/SLP - asa, plavix - f/u echo - Rosuvastatin 10 mg nightly resumed  L wrist fracture POA- occurred 06/02/2023. Wearing a brace and having tenderness with flexion and extension. Swelling and tenderness to palpation. Repeat xray on admission showed "subtle triquetral fracture" vs soft-tissue calcification. This is consistent with prior imaging - continue to wear brace when ambulating and avoid weight-bearing to the wrist.  - OT good to continue and encourage ROM.  - will need ortho follow up   R first MTP pain- present upon waking up in hospital today. History and exam consistent with gout. Upon medication review don't see significant contributor and has no h/o same. Denies injury - trial of steroids and local pain reliever including warm compresses - can consider aspiration for diagnosis if not healing in expected fashion   Dysuria POA- urinalysis insignificant    MDD (major depressive disorder), single episode, mild (HCC) Bupropion 100 mg daily resumed   Mild dementia (HCC) Memantine 10 mg p.o. twice daily, donepezil 10 mg nightly resumed   OSA on CPAP CPAP nightly ordered   GERD- mild   Hypertension- not on home medications currently for permissive hypertension. Can resume amlodipine s/p neuro evaluation  Body mass index is 25.4 kg/m.  VTE ppx: SCD's Start: 06/23/23  1341  Diet:     Diet   Diet Heart Fluid consistency: Thin   Consultants: Neurology   Subjective 06/24/23    Pt reports feeling improved today from admission. Still has weakness of left arm and leg. Daughter points out that he speech is somewhat slurred this  morning. Endorses pain in R great toe. No pain in left wrist at rest.    Objective   Vitals:   06/23/23 1500 06/23/23 1941 06/23/23 2340 06/24/23 0537  BP: 127/62 (!) 127/49 (!) 101/45 (!) 121/44  Pulse: (!) 58 65 64 72  Resp: 19 18 18 17   Temp: 98.2 F (36.8 C) 98.6 F (37 C) 97.7 F (36.5 C) 98.1 F (36.7 C)  TempSrc: Oral     SpO2: 94% 93% 95% 94%  Weight:      Height:        Intake/Output Summary (Last 24 hours) at 06/24/2023 0720 Last data filed at 06/23/2023 1100 Gross per 24 hour  Intake 999 ml  Output --  Net 999 ml   Filed Weights   06/23/23 0824  Weight: 63 kg    Physical Exam:  General: awake, alert, NAD HEENT: atraumatic, clear conjunctiva, anicteric sclera, MMM, hearing grossly normal Respiratory: normal respiratory effort. Cardiovascular: quick capillary refill, normal S1/S2, RRR, no JVD, murmurs Gastrointestinal: soft, NT, ND Nervous: A&O x3. 3/5 strength left hand/wrist, 4/5 strength left lower extremity. No dysarthria to my ear. Facial symmetry intact  Extremities: R first MTP swollen, increased warmth compared to opposite.  Left wrist edema compared to R. Tender to palpation and flexion, extension Skin: dry, intact, normal temperature, normal color. No rashes, lesions or ulcers on exposed skin Psychiatry: normal mood, congruent affect  Labs   I have personally reviewed the following labs and imaging studies CBC    Component Value Date/Time   WBC 9.8 06/23/2023 0831   RBC 4.06 06/23/2023 0831   HGB 12.4 06/23/2023 0831   HGB 14.2 05/02/2015 0819   HCT 37.9 06/23/2023 0831   HCT 41.9 05/02/2015 0819   PLT 226 06/23/2023 0831   PLT 267 05/02/2015 0819   MCV 93.3 06/23/2023 0831   MCV 89 05/02/2015 0819   MCH 30.5 06/23/2023 0831   MCHC 32.7 06/23/2023 0831   RDW 13.6 06/23/2023 0831   RDW 13.8 05/02/2015 0819   LYMPHSABS 2.0 11/06/2021 1015   LYMPHSABS 3.6 (H) 05/02/2015 0819   MONOABS 0.5 11/06/2021 1015   EOSABS 0.2 11/06/2021 1015    EOSABS 0.2 05/02/2015 0819   BASOSABS 0.0 11/06/2021 1015   BASOSABS 0.0 05/02/2015 0819      Latest Ref Rng & Units 06/23/2023    8:31 AM 05/02/2023    2:58 PM 04/08/2022    8:59 AM  BMP  Glucose 70 - 99 mg/dL 161  92  96   BUN 8 - 23 mg/dL 24  18  17    Creatinine 0.44 - 1.00 mg/dL 0.96  0.45  4.09   BUN/Creat Ratio 6 - 22 (calc)  16    Sodium 135 - 145 mmol/L 138  143  142   Potassium 3.5 - 5.1 mmol/L 3.7  3.7  3.9   Chloride 98 - 111 mmol/L 105  107  104   CO2 22 - 32 mmol/L 25  27  29    Calcium 8.9 - 10.3 mg/dL 8.9  9.7  9.8     DG Wrist 2 Views Left  Result Date: 06/23/2023 CLINICAL DATA:  Wrist pain EXAM: LEFT WRIST -  2 VIEW COMPARISON:  None Available. FINDINGS: There is dorsal wrist soft tissue swelling. There is a thin curvilinear density in this region which may represent soft tissue calcification versus subtle triquetral fracture. No other fractures are visualized. Joint spaces are maintained. There is calcification of the triangular fibrocartilage, degenerative in nature. IMPRESSION: Dorsal wrist soft tissue swelling. Thin curvilinear density in this region may represent soft tissue calcification versus subtle triquetral fracture. Electronically Signed   By: Darliss Cheney M.D.   On: 06/23/2023 21:57   MR BRAIN WO CONTRAST  Result Date: 06/23/2023 CLINICAL DATA:  Neuro deficit, acute, stroke suspected. Left sided weakness. EXAM: MRI HEAD WITHOUT CONTRAST TECHNIQUE: Multiplanar, multiecho pulse sequences of the brain and surrounding structures were obtained without intravenous contrast. COMPARISON:  CTA head and neck 06/23/2023.  MRI head 09/11/2021. FINDINGS: Brain: There is a 5 mm acute infarct at the superior aspect of the right lentiform nucleus. Patchy T2 hyperintensities in the cerebral white matter and pons have likely mildly progressed from the prior MRI and are nonspecific but compatible with moderate chronic small vessel ischemic disease. There are small chronic  bilateral cerebellar infarcts which have slightly increased in number from the prior MRI. There is a small amount of hemosiderin deposition associated with an inferior left cerebellar infarct. There is mild generalized cerebral atrophy. No mass, midline shift, or extra-axial fluid collection is identified. Vascular: Major intracranial vascular flow voids are preserved. Skull and upper cervical spine: Unremarkable bone marrow signal. Sinuses/Orbits: Bilateral cataract extraction. Paranasal sinuses and mastoid air cells are clear. Other: None. IMPRESSION: 1. Small acute right basal ganglia infarct. 2. Moderate chronic small vessel ischemic disease. 3. Chronic cerebellar infarcts. Electronically Signed   By: Sebastian Ache M.D.   On: 06/23/2023 17:10   CT ANGIO HEAD NECK W WO CM  Result Date: 06/23/2023 CLINICAL DATA:  77 year old female with neurologic deficit, abnormal sensation, hypotensive at presentation. EXAM: CT ANGIOGRAPHY HEAD AND NECK WITH AND WITHOUT CONTRAST TECHNIQUE: Multidetector CT imaging of the head and neck was performed using the standard protocol during bolus administration of intravenous contrast. Multiplanar CT image reconstructions and MIPs were obtained to evaluate the vascular anatomy. Carotid stenosis measurements (when applicable) are obtained utilizing NASCET criteria, using the distal internal carotid diameter as the denominator. RADIATION DOSE REDUCTION: This exam was performed according to the departmental dose-optimization program which includes automated exposure control, adjustment of the mA and/or kV according to patient size and/or use of iterative reconstruction technique. CONTRAST:  75mL OMNIPAQUE IOHEXOL 350 MG/ML SOLN COMPARISON:  Brain MRI 09/11/2021. Head CT 09/12/2021. CTA head and neck 09/11/2021. FINDINGS: CT HEAD Brain: Stable cerebral volume. No midline shift, ventriculomegaly, mass effect, evidence of mass lesion, intracranial hemorrhage or evidence of cortically  based acute infarction. Small chronic bilateral cerebellar infarcts. Patchy and confluent cerebral white matter hypodensity appears stable. Calvarium and skull base: No acute osseous abnormality identified. Paranasal sinuses: Visualized paranasal sinuses and mastoids are stable and well aerated. Orbits: No acute orbit or scalp soft tissue finding. CTA NECK Skeleton: Widespread chronic cervical spine degeneration. No acute osseous abnormality identified. Upper chest: Chronic mosaic attenuation in the upper lungs is stable. Other neck: Chronic thyroid heterogeneity This has been evaluated on previous imaging. (ref: J Am Coll Radiol. 2015 Feb;12(2): 143-50).Chronic partially retropharyngeal course of the right carotid. Aortic arch: Calcified aortic atherosclerosis.  3 vessel arch. Right carotid system: No brachiocephalic or proximal right CCA plaque or stenosis. Tortuous right CCA origin. Moderate calcified plaque at the right ICA origin  and bulb results in 50 % stenosis with respect to the distal vessel, stable. Left carotid system: No left CCA plaque or stenosis. Mild calcified plaque at the left ICA origin and bulb without stenosis. Tortuous left ICA in the upper neck. Vertebral arteries: Proximal right subclavian and right vertebral artery origin plaque without stenosis appears stable. Mildly non dominant right vertebral artery is patent and stable to the skull base without stenosis. Proximal left subclavian calcified plaque with less than 50 % stenosis with respect to the distal vessel. Chronic calcified plaque at the left vertebral artery origin but only mild origin stenosis, stable since last year. Dominant left vertebral artery is patent and stable to the skull base without additional stenosis. CTA HEAD Posterior circulation: Distal vertebral arteries and vertebrobasilar junction remain patent without plaque or stenosis. Dominant left V4. Patent PICA origins. Patent basilar artery without stenosis. Normal SCA  and PCA origins. Posterior communicating arteries are diminutive or absent. Bilateral PCAs are stable, within normal limits. Anterior circulation: Both ICA siphons are patent. Left siphon calcified plaque with mild supraclinoid stenosis is stable. Right siphon heavy supraclinoid calcified plaque with moderate to severe stenosis appears progressed since last year on series 10, image 234. Both carotid termini remain patent. Left ACA A1 segment is diminutive or absent. Right A1, anterior communicating artery, bilateral ACA branches are within normal limits. Left MCA M1 segment and bifurcation are patent without stenosis. Right MCA M1 segment and trifurcation are patent without stenosis. Bilateral MCA branches are stable and within normal limits. Venous sinuses: Patent. Anatomic variants: Dominant left vertebral artery. Dominant right and diminutive or absent left ACA A1 segments. Review of the MIP images confirms the above findings IMPRESSION: 1. Negative for large vessel occlusion. But chronic atherosclerosis appears progressed at the Right ICA siphon supraclinoid segment since last year, now with Moderate to Severe stenosis there. 2. No other significant stenosis on CTA head and neck. Aortic Atherosclerosis (ICD10-I70.0). 3. Stable CT appearance of the chronic small vessel disease, chronic small cerebellar infarcts. Electronically Signed   By: Odessa Fleming M.D.   On: 06/23/2023 11:29    Disposition Plan & Communication  Patient status: Observation  Admitted From: Home Planned disposition location: Skilled nursing facility Anticipated discharge date: 10/16 pending completed neuro workup and PT/OT evaluation for placement  Family Communication: daughter at bedside    Author: Leeroy Bock, DO [facility] 06/24/2023, 7:20 AM   Available by Epic secure chat 7AM-7PM. If 7PM-7AM, please contact night-coverage.  [facility] contact information found on [email or web address].

## 2023-06-24 NOTE — Evaluation (Signed)
Physical Therapy Evaluation Patient Details Name: MARCEIL LINTZ MRN: [id number] DOB: [birth year] Today's Date: 06/24/2023  History of Present Illness  Pt is a 77 y.o. female presenting to hospital 06/23/23 with generalized weakness as well as L arm and L leg weakness.  Also c/o pain in L arm (acute; s/p fall at home) and L leg.  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  L wrist x-ray: "Thin curvilinear density in this  region may represent soft tissue calcification versus subtle  triquetral fracture".   Pt admitted with stroke-like symptom, dysuria.  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  Clinical Impression  Prior to recent medical concerns, pt was independent with functional mobility; lives with her husband; daughter lives close by.  Currently pt is mod assist semi-supine to sitting EOB; min to mod assist x2 to stand from bed; and CGA to min assist x2 to take steps bed to recliner with L platform RW use.  Pain L wrist, R great toe, and proximal lateral L lower leg 4/10 at rest beginning/end of session but 8/10 with activity.  L UE/LE weakness noted.  Pt appearing very motivated to participate in therapy and pt's family appearing very supportive.  Pt would currently benefit from skilled PT to address noted impairments and functional limitations (see below for any additional details).  Upon hospital discharge, pt would benefit from ongoing therapy.     If plan is discharge home, recommend the following: A lot of help with walking and/or transfers;A little help with bathing/dressing/bathroom;Assistance with cooking/housework;Direct supervision/assist for medications management;Assist for transportation;Help with stairs or ramp for entrance   Can travel by private vehicle        Equipment Recommendations Other (comment);BSC/3in1;Wheelchair (measurements PT);Wheelchair cushion (measurements PT) (L platform RW (youth sized))  Recommendations  for Other Services       Functional Status Assessment Patient has had a recent decline in their functional status and demonstrates the ability to make significant improvements in function in a reasonable and predictable amount of time.     Precautions / Restrictions Precautions Precautions: Fall Required Braces or Orthoses: Splint/Cast Splint/Cast: L wrist splint Restrictions Weight Bearing Restrictions: Yes LUE Weight Bearing: Non weight bearing Other Position/Activity Restrictions: NWB L wrist/hand; can use platform walker (per discussion with MD Dareen Piano)      Mobility  Bed Mobility Overal bed mobility: Needs Assistance Bed Mobility: Supine to Sit     Supine to sit: Mod assist     General bed mobility comments: assist for trunk and B LE's; vc's for technique; use of bed sheet to scoot forward (d/t increased sensitivity/pain on L leg)    Transfers Overall transfer level: Needs assistance Equipment used: Rolling walker (2 wheels) Transfers: Sit to/from Stand, Bed to chair/wheelchair/BSC Sit to Stand: Min assist, Mod assist, +2 physical assistance   Step pivot transfers: Contact guard assist, Min assist, +2 physical assistance       General transfer comment: vc's for UE/LE placement; assist to initiate stand and control descent sitting; stand step turn bed to recliner with RW use (vc's for L platform walker use)--limited d/t pain    Ambulation/Gait               General Gait Details: Deferred d/t increased pain with activity  Stairs            Wheelchair Mobility     Tilt Bed    Modified Rankin (Stroke Patients Only)  Balance Overall balance assessment: Needs assistance Sitting-balance support: No upper extremity supported, Feet supported Sitting balance-Leahy Scale: Good Sitting balance - Comments: steady reaching within BOS   Standing balance support: Bilateral upper extremity supported Standing balance-Leahy Scale: Fair Standing  balance comment: steady static standing using L platform RW                             Pertinent Vitals/Pain Pain Assessment Pain Assessment: 0-10 Pain Score: 4  Pain Location: L wrist, R great toe (from gout), and L lateral proximal lower leg Pain Descriptors / Indicators: Tender, Radiating, Discomfort Pain Intervention(s): Limited activity within patient's tolerance, Monitored during session, Repositioned, Other (comment) (RN updated on pt's pain) Vitals (HR and SpO2 on room air) stable and WFL throughout treatment session.    Home Living Family/patient expects to be discharged to:: Private residence Living Arrangements: Spouse/significant other Available Help at Discharge: Available 24 hours/day Type of Home: House Home Access: Stairs to enter Entrance Stairs-Rails: Left Entrance Stairs-Number of Steps: 2 from, 0 steps in back (main doorway they use) Alternate Level Stairs-Number of Steps: living area/bed in basement, shower and kitchen are upstairs Home Layout: Two level Home Equipment: Hand held shower head Additional Comments: 1/2 bath in basement; full bath on main level.  Pt's daughter lives close by and has 1 level home with level entry if needed.    Prior Function Prior Level of Function : History of Falls (last six months);Working/employed;Driving;Independent/Modified Independent             Mobility Comments: Independent prior to hospitalization.  1 fall a few weeks ago.  Takes care of her dog. ADLs Comments: PTA Pt reports indep ADL/IADLs. Daughter home (hoping to stay after d/c): all one level, open floor plan, walkin shower, [place]ard toilets     Extremity/Trunk Assessment   Upper Extremity Assessment Upper Extremity Assessment: Defer to OT evaluation;LUE deficits/detail (good R hand grip strength; RUE AROM WFL) LUE Deficits / Details: L wrist splint in place; AROM L elbow flexion/extension WFL; AROM L shoulder flexion to about 150 degrees LUE:  Unable to fully assess due to immobilization    Lower Extremity Assessment Lower Extremity Assessment: RLE deficits/detail;LLE deficits/detail RLE Deficits / Details: hip flexion 4+/5; knee flexion/extension 4+/5; DF 4/5 (limited d/t pain radiating down to great toe) RLE: Unable to fully assess due to pain LLE Deficits / Details: hip flexion 3+5; knee flexion/extension 3+/5; DF 4/5; possible increased tone (vs resistance d/t pain) L hip/knee LLE: Unable to fully assess due to pain LLE Sensation: decreased light touch (L lateral lower leg; tingling noted top of feet) LLE Coordination:  (symmetrical alternating toe taps)    Cervical / Trunk Assessment Cervical / Trunk Assessment: Normal  Communication   Communication Communication: Difficulty communicating thoughts/reduced clarity of speech Cueing Techniques: Verbal cues;Visual cues  Cognition Arousal: Alert Behavior During Therapy: WFL for tasks assessed/performed Overall Cognitive Status: Within Functional Limits for tasks assessed                                 General Comments: A&O to at least person/DOB.  Family answering a lot of pt's questions.  Increased time to respond to questions.        General Comments  Nursing cleared pt for participation in physical therapy.  Pt agreeable to PT session.  Pt's daughter present during session; pt's husband left beginning  of session.    Exercises  Transfer training   Assessment/Plan    PT Assessment Patient needs continued PT services  PT Problem List Decreased strength;Decreased activity tolerance;Decreased balance;Decreased mobility;Decreased knowledge of use of DME;Decreased knowledge of precautions;Impaired sensation;Pain       PT Treatment Interventions DME instruction;Gait training;Stair training;Functional mobility training;Therapeutic activities;Therapeutic exercise;Balance training;Neuromuscular re-education;Patient/family education    PT Goals (Current  goals can be found in the Care Plan section)  Acute Rehab PT Goals Patient Stated Goal: to improve walking PT Goal Formulation: With patient Time For Goal Achievement: 07/08/23 Potential to Achieve Goals: Good    Frequency Min 1X/week     Co-evaluation               AM-PAC PT "6 Clicks" Mobility  Outcome Measure Help needed turning from your back to your side while in a flat bed without using bedrails?: A Little Help needed moving from lying on your back to sitting on the side of a flat bed without using bedrails?: A Lot Help needed moving to and from a bed to a chair (including a wheelchair)?: A Lot Help needed standing up from a chair using your arms (e.g., wheelchair or bedside chair)?: A Lot Help needed to walk in hospital room?: Total Help needed climbing 3-5 steps with a railing? : Total 6 Click Score: 11    End of Session Equipment Utilized During Treatment: Gait belt Activity Tolerance: Patient limited by pain Patient left: in chair;with call bell/phone within reach;with chair alarm set;with family/visitor present;Other (comment) (Neurologist and daughter present; B heels floating via pillow support) Nurse Communication: Mobility status;Precautions;Other (comment);Weight bearing status (Pt's pain status) PT Visit Diagnosis: Other abnormalities of gait and mobility (R26.89);Muscle weakness (generalized) (M62.81);Pain;Hemiplegia and hemiparesis Hemiplegia - Right/Left: Left Hemiplegia - caused by: Cerebral infarction Pain - Right/Left: Left (R) Pain - part of body:  (L wrist; L lateral proximal lower leg; R great toe)    Time: 1610-9604 PT Time Calculation (min) (ACUTE ONLY): 38 min   Charges:   PT Evaluation $PT Eval Moderate Complexity: 1 Mod PT Treatments $Therapeutic Activity: 8-22 mins PT General Charges $$ ACUTE PT VISIT: 1 Visit        Hendricks Limes, PT 06/24/23, 3:00 PM

## 2023-06-24 NOTE — Plan of Care (Signed)
Problem: Clinical Measurements: Goal: Ability to maintain clinical measurements within normal limits will improve Outcome: Progressing Goal: Will remain free from infection Outcome: Progressing Goal: Diagnostic test results will improve Outcome: Progressing Goal: Respiratory complications will improve Outcome: Progressing Goal: Cardiovascular complication will be avoided Outcome: Progressing   Problem: Nutrition: Goal: Adequate nutrition will be maintained Outcome: Progressing   Problem: Elimination: Goal: Will not experience complications related to bowel motility Outcome: Progressing Goal: Will not experience complications related to urinary retention Outcome: Progressing   Problem: Safety: Goal: Ability to remain free from injury will improve Outcome: Progressing   Problem: Skin Integrity: Goal: Risk for impaired skin integrity will decrease Outcome: Progressing

## 2023-06-24 NOTE — Consult Note (Addendum)
NEURO HOSPITALIST CONSULT NOTE   Requestig physician: Dr. Sedalia Muta  Reason for Consult: Acute right basal ganglia infarction on MRI  History obtained from:  Family, Patient and Chart     HPI:                                                                                                                                          Kathy Baldwin is an 77 y.o. 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 LKN regarding the weakness was Sunday night, but she noted that at that time she started to feel off and had chills. Earlier on Sunday she had been driving when she became confused; she was able to get home but then started to experience "the shakes" which lasted all night. On awakening Monday morning, she felt new onset of left arm and leg pain in addition to new left sided weakness. Her left sided weakness felt worse than the right sided weakness that had been experienced with her prior stroke in 2023. Per EMS her initial BP was 77/46, dropping to 60/30 upon standing. She was bolused with fluids en route.   Home medications include ASA, Plavix and rosuvastatin.   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 Holly Bodily 2010   DNR no code (do not resuscitate) 03/20/2017   GERD (gastroesophageal reflux disease)    Hearing loss    History of gallstones    Hyperlipidemia    Hypertension    Hypertrophy of nasal turbinates    Leg mass, left    in calf muscle seeing surgeon tomorrow regarding   Lymphocytosis    Menopause  age 1   Multiple food allergies    chocolate and cabbage   OSA on CPAP    Osteopenia Oct. 2014   Sleep apnea    Stroke (HCC) 09/11/2021   Thyroid nodule    Vertigo    Vitamin D deficiency disease     Past Surgical History:  Procedure Laterality Date   BREAST CYST ASPIRATION Right    negative   CARPAL TUNNEL RELEASE Right 2015   CATARACT EXTRACTION W/PHACO Left 09/30/2018   Procedure: CATARACT EXTRACTION PHACO AND INTRAOCULAR LENS PLACEMENT (IOC)  LEFT;  Surgeon: Lockie Mola, MD;  Location: [facility];  Service:  Ophthalmology;  Laterality: Left;  sleep apnea   CATARACT EXTRACTION W/PHACO Right 10/28/2018   Procedure: CATARACT EXTRACTION PHACO AND INTRAOCULAR LENS PLACEMENT (IOC)  RIGHT;  Surgeon: Lockie Mola, MD;  Location: [facility];  Service: Ophthalmology;  Laterality: Right;   CHOLECYSTECTOMY  1990   COLONOSCOPY  04/25/2008   CYST EXCISION Left 2020   L popliteal/peroneal cyst excision s/p residual numbness   DILATION AND CURETTAGE OF UTERUS  1982   s/p miscarriage   ESOPHAGOGASTRODUODENOSCOPY  08/2020   chronic gastritis, neg H pylori, no Barretts, no rpt needed [facility])    Family History  Problem Relation Age of Onset   Healthy Mother    Arthritis Mother    Lung disease Father        black lung   Heart disease Father    Hyperlipidemia Father    Hypertension Father    Emphysema Sister    Healthy Sister    Cancer Brother        unsure of type   Asthma Daughter    Heart disease Sister    Thyroid disease Sister    Stroke Maternal Grandmother    Hypertension Maternal Grandmother    Diabetes Maternal Grandmother    Diabetes Sister    Healthy Sister    Breast cancer Neg Hx               Social History:  reports that she has never smoked. She has never used smokeless tobacco. She reports that she does not currently use alcohol. She reports that she does not use drugs.  Allergies  Allergen Reactions   Cabbage Other (See  Comments)    Hypersensitivity on allergy testing   Chocolate Other (See Comments)    Hypersensitivity on allergy testing   Statins Other (See Comments)    Zocor and Pravastatin--leg cramps Other reaction(s): Other (See Comments) Zocor and Pravastatin--leg cramps    MEDICATIONS:                                                                                                                     Prior to Admission:  Medications Prior to Admission  Medication Sig Dispense Refill Last Dose   amLODipine (NORVASC) 5 MG tablet Take 0.5 tablets (2.5 mg total) by mouth daily. 90 tablet 3 Past Week   aspirin EC 81 MG EC tablet Take 1 tablet (81 mg total) by mouth daily. Swallow whole. 21 tablet 0 Past Week   buPROPion ER (WELLBUTRIN SR) 100 MG 12 hr tablet Take 1 tablet (100 mg total) by mouth daily. 90 tablet 4 Past Week   Calcium Carb-Cholecalciferol (CALCIUM 600+D) 600-800 MG-UNIT TABS Take by mouth daily. 2 capsule daily   Past Week   clopidogrel (PLAVIX) 75 MG tablet Take 1 tablet (75 mg total) by mouth daily. 90 tablet 0 Past Week   COLLAGEN PO Take by mouth.   Past Week   Cyanocobalamin (B-12) 2500 MCG TABS Take 1 tablet by mouth daily. Pt taking 1/2 of a  5000 mcg tablet   Past Week   donepezil (ARICEPT) 10 MG tablet Take 10 mg by mouth at bedtime.    Past Week   famotidine (PEPCID) 20 MG tablet Take 1 tablet (20 mg total) by mouth 2 (two) times daily. For heartburn/indigestion 180 tablet 4 Past Week   FIBER PO Take by mouth.   Past Week   fluticasone (FLONASE) 50 MCG/ACT nasal spray instill TWO SPRAYS in each nostril daily (shake gently) 48 g 3 Past Week   fluticasone-salmeterol (WIXELA INHUB) 250-50 MCG/ACT AEPB Inhale 1 puff into the lungs in the morning and at bedtime. 60 each 11 Past Week   gabapentin (NEURONTIN) 100 MG capsule Take 1 capsule (100 mg total) by mouth in the morning.   Past Week   meclizine (ANTIVERT) 25 MG tablet Take 1 tablet (25 mg total) by mouth 3 (three) times daily as  needed for dizziness. 30 tablet 0 prn at unknown   memantine (NAMENDA) 10 MG tablet Take 10 mg by mouth 2 (two) times daily.   Past Week   mirabegron ER (MYRBETRIQ) 25 MG TB24 tablet Take 1 tablet (25 mg total) by mouth daily. 90 tablet 3 Past Week   Multiple Vitamin (MULTIVITAMIN) tablet Take 1 tablet by mouth daily.   Past Week   pantoprazole (PROTONIX) 40 MG tablet Take 40 mg by mouth daily.   Past Week   polyethylene glycol (MIRALAX / GLYCOLAX) 17 g packet Take 8.5-17 g by mouth daily as needed.   prn at unknown   rosuvastatin (CRESTOR) 10 MG tablet TAKE 1 TABLET BY MOUTH AT BEDTIME 90 tablet 4 Past Week   conjugated estrogens (PREMARIN) vaginal cream Use a pea-sized amount externally and internally twice a week at night (Patient not taking: Reported on 06/23/2023) 30 g 1 Not Taking   PEDIASURE/FIBER (PEDIASURE/FIBER) LIQD Take 10 mLs by mouth.      Scheduled:  aspirin EC  81 mg Oral Daily   buPROPion ER  100 mg Oral Daily   clopidogrel  75 mg Oral Daily   donepezil  10 mg Oral QHS   famotidine  10 mg Oral BID WC   gabapentin  100 mg Oral Daily   memantine  10 mg Oral BID   pantoprazole  40 mg Oral QODAY   rosuvastatin  10 mg Oral QHS   Continuous:   ROS:                                                                                                                                       No SOB, CP, abdominal pain, dysuria, vomiting or diarrhea.   Blood pressure (!) 121/44, pulse 72, temperature 98.1 F (36.7 C), resp. rate 17, height 5\' 2"  (1.575 m), weight 63 kg, SpO2 94%.   General Examination:  Physical Exam HEENT- [place]/AT   Lungs- Respirations unlabored Extremities- Warm and well-perfused  Neurological Examination Mental Status: Awake and alert. Fully oriented. Thought content appropriate.  Speech fluent without evidence of aphasia.  Able to follow all commands without  difficulty. Cranial Nerves: II: Temporal visual fields intact with no extinction to DSS. PERRL. III,IV, VI: No ptosis. EOMI. No nystagmus. V: Temp sensation equal bilaterally VII: Smile symmetric VIII: Hearing intact to voice IX,X: No hypophonia or hoarseness XI: Symmetric XII: Midline tongue extension Motor: RUE: 5/5 proximally and distally.  LUE: 5/5 to shoulder in adduction and abduction; able to lift forearm off bed while in cast, but cannot resist with biceps/triceps due to pain and cannot grip dur to cast.  RLE: 5/5 LLE: 4+/5 proximally to HF and knee extension. 4/5 ADF and APF  Sensory: Temp and FT intact x 4. No asymmetry. No extinction to DSS. Deep Tendon Reflexes: Deferred due to joint pain at multiple locations.  Cerebellar: No ataxia with FNF on the right, unable to perform on the left due to pain Gait: Deferred    Lab Results: Basic Metabolic Panel: Recent Labs  Lab 06/23/23 0831  NA 138  K 3.7  CL 105  CO2 25  GLUCOSE 115*  BUN 24*  CREATININE 1.13*  CALCIUM 8.9    CBC: Recent Labs  Lab 06/23/23 0831  WBC 9.8  HGB 12.4  HCT 37.9  MCV 93.3  PLT 226    Cardiac Enzymes: No results for input(s): "CKTOTAL", "CKMB", "CKMBINDEX", "TROPONINI" in the last 168 hours.  Lipid Panel: Recent Labs  Lab 06/24/23 0518  CHOL 129  TRIG 48  HDL 63  CHOLHDL 2.0  VLDL 10  LDLCALC 56    Imaging: DG Wrist 2 Views Left  Result Date: 06/23/2023 CLINICAL DATA:  Wrist pain EXAM: LEFT WRIST - 2 VIEW COMPARISON:  None Available. FINDINGS: There is dorsal wrist soft tissue swelling. There is a thin curvilinear density in this region which may represent soft tissue calcification versus subtle triquetral fracture. No other fractures are visualized. Joint spaces are maintained. There is calcification of the triangular fibrocartilage, degenerative in nature. IMPRESSION: Dorsal wrist soft tissue swelling. Thin curvilinear density in this region may represent soft tissue  calcification versus subtle triquetral fracture. Electronically Signed   By: Darliss Cheney M.D.   On: 06/23/2023 21:57   MR BRAIN WO CONTRAST  Result Date: 06/23/2023 CLINICAL DATA:  Neuro deficit, acute, stroke suspected. Left sided weakness. EXAM: MRI HEAD WITHOUT CONTRAST TECHNIQUE: Multiplanar, multiecho pulse sequences of the brain and surrounding structures were obtained without intravenous contrast. COMPARISON:  CTA head and neck 06/23/2023.  MRI head 09/11/2021. FINDINGS: Brain: There is a 5 mm acute infarct at the superior aspect of the right lentiform nucleus. Patchy T2 hyperintensities in the cerebral white matter and pons have likely mildly progressed from the prior MRI and are nonspecific but compatible with moderate chronic small vessel ischemic disease. There are small chronic bilateral cerebellar infarcts which have slightly increased in number from the prior MRI. There is a small amount of hemosiderin deposition associated with an inferior left cerebellar infarct. There is mild generalized cerebral atrophy. No mass, midline shift, or extra-axial fluid collection is identified. Vascular: Major intracranial vascular flow voids are preserved. Skull and upper cervical spine: Unremarkable bone marrow signal. Sinuses/Orbits: Bilateral cataract extraction. Paranasal sinuses and mastoid air cells are clear. Other: None. IMPRESSION: 1. Small acute right basal ganglia infarct. 2. Moderate chronic small vessel ischemic disease. 3. Chronic cerebellar  infarcts. Electronically Signed   By: Sebastian Ache M.D.   On: 06/23/2023 17:10   CT ANGIO HEAD NECK W WO CM  Result Date: 06/23/2023 CLINICAL DATA:  77 year old female with neurologic deficit, abnormal sensation, hypotensive at presentation. EXAM: CT ANGIOGRAPHY HEAD AND NECK WITH AND WITHOUT CONTRAST TECHNIQUE: Multidetector CT imaging of the head and neck was performed using the standard protocol during bolus administration of intravenous contrast.  Multiplanar CT image reconstructions and MIPs were obtained to evaluate the vascular anatomy. Carotid stenosis measurements (when applicable) are obtained utilizing NASCET criteria, using the distal internal carotid diameter as the denominator. RADIATION DOSE REDUCTION: This exam was performed according to the departmental dose-optimization program which includes automated exposure control, adjustment of the mA and/or kV according to patient size and/or use of iterative reconstruction technique. CONTRAST:  75mL OMNIPAQUE IOHEXOL 350 MG/ML SOLN COMPARISON:  Brain MRI 09/11/2021. Head CT 09/12/2021. CTA head and neck 09/11/2021. FINDINGS: CT HEAD Brain: Stable cerebral volume. No midline shift, ventriculomegaly, mass effect, evidence of mass lesion, intracranial hemorrhage or evidence of cortically based acute infarction. Small chronic bilateral cerebellar infarcts. Patchy and confluent cerebral white matter hypodensity appears stable. Calvarium and skull base: No acute osseous abnormality identified. Paranasal sinuses: Visualized paranasal sinuses and mastoids are stable and well aerated. Orbits: No acute orbit or scalp soft tissue finding. CTA NECK Skeleton: Widespread chronic cervical spine degeneration. No acute osseous abnormality identified. Upper chest: Chronic mosaic attenuation in the upper lungs is stable. Other neck: Chronic thyroid heterogeneity This has been evaluated on previous imaging. (ref: J Am Coll Radiol. 2015 Feb;12(2): 143-50).Chronic partially retropharyngeal course of the right carotid. Aortic arch: Calcified aortic atherosclerosis.  3 vessel arch. Right carotid system: No brachiocephalic or proximal right CCA plaque or stenosis. Tortuous right CCA origin. Moderate calcified plaque at the right ICA origin and bulb results in 50 % stenosis with respect to the distal vessel, stable. Left carotid system: No left CCA plaque or stenosis. Mild calcified plaque at the left ICA origin and bulb without  stenosis. Tortuous left ICA in the upper neck. Vertebral arteries: Proximal right subclavian and right vertebral artery origin plaque without stenosis appears stable. Mildly non dominant right vertebral artery is patent and stable to the skull base without stenosis. Proximal left subclavian calcified plaque with less than 50 % stenosis with respect to the distal vessel. Chronic calcified plaque at the left vertebral artery origin but only mild origin stenosis, stable since last year. Dominant left vertebral artery is patent and stable to the skull base without additional stenosis. CTA HEAD Posterior circulation: Distal vertebral arteries and vertebrobasilar junction remain patent without plaque or stenosis. Dominant left V4. Patent PICA origins. Patent basilar artery without stenosis. Normal SCA and PCA origins. Posterior communicating arteries are diminutive or absent. Bilateral PCAs are stable, within normal limits. Anterior circulation: Both ICA siphons are patent. Left siphon calcified plaque with mild supraclinoid stenosis is stable. Right siphon heavy supraclinoid calcified plaque with moderate to severe stenosis appears progressed since last year on series 10, image 234. Both carotid termini remain patent. Left ACA A1 segment is diminutive or absent. Right A1, anterior communicating artery, bilateral ACA branches are within normal limits. Left MCA M1 segment and bifurcation are patent without stenosis. Right MCA M1 segment and trifurcation are patent without stenosis. Bilateral MCA branches are stable and within normal limits. Venous sinuses: Patent. Anatomic variants: Dominant left vertebral artery. Dominant right and diminutive or absent left ACA A1 segments. Review of the MIP images  confirms the above findings IMPRESSION: 1. Negative for large vessel occlusion. But chronic atherosclerosis appears progressed at the Right ICA siphon supraclinoid segment since last year, now with Moderate to Severe stenosis  there. 2. No other significant stenosis on CTA head and neck. Aortic Atherosclerosis (ICD10-I70.0). 3. Stable CT appearance of the chronic small vessel disease, chronic small cerebellar infarcts. Electronically Signed   By: Odessa Fleming M.D.   On: 06/23/2023 11:29     Assessment: 77 year old female presenting with AMS and left sided weakness. MRI reveals a small acute right basal ganglia ischemic infarction - Neurological examination reveals mild to moderate left sided weakness.  - MRI brain: Small acute right basal ganglia infarct. Moderate chronic small vessel ischemic disease. Chronic cerebellar infarcts.  -  CTA of head and neck: Negative for large vessel occlusion. But chronic atherosclerosis appears progressed at the Right ICA siphon supraclinoid segment since last year, now with Moderate to Severe stenosis there. No other significant stenosis on CTA head and neck. Aortic Atherosclerosis. - Labs suggestive of volume depletion at home, with BUN 24 and Cr 1.13, both worsened since 8/23. Has she reduced her po fluid intake or been exposed to high levels of heat recently?  - Cholesterol panel is normal.  - Last HgbA1c in Jan 2023 was normal at 5.6 - Stroke risk factors: Prior stroke, advanced age, HLD and HTN - Vascular Surgery at [facility] has seen the patient. They feel that most of the patient's difficulties are coming from intracranial vascular issues. They do not plan on any procedures here, but recommend that the patient follow-up with Endovascular Neurosurgeon Dr. Marliss Czar from the [facility] Endovascular Neurosurgery department for further review.  - DDx for underlying mechanism of her acute stroke includes transient cerebral hypoperfusion in the setting of her hypotension and worsened right ICA supraclinoid (intracranial) segment atherosclerotic stenosis. Cardioembolic mechanism or in situ thrombosis with distal embolization are also possible, but are felt to be lower on the DDx.  - Family  notices slight slurring of speech (not apparent to examiner) and request ST consult  Recommendations: - Consider increasing her rosuvastatin dose to 20 mg po every day (currently at 10 mg every day). AST and ALT are normal, but total bilirubin is elevated to 1.3 since August. Track LFTs while on statin therapy. Patient should monitor for possible development of muscle cramps, as she has had leg cramps with other statins in the past.  - Obtain baseline CK level (ordered) - HgbA1c - TTE - PT and OT are seeing the patient - Prophylactic therapy- Continue her home DAPT.  - Risk factor modification - Telemetry monitoring - Frequent neuro checks - NPO until passes stroke swallow screen - BP management per standard protocol. Out of the permissive HTN time window.   - Keep well hydrated.  - Speech therapy consult (ordered) - Stroke work up is complete. Treatment recommendations as above. May need further management by Vascular Surgery. Neurohospitalist service will sign off.  - Outpatient Vascular Surgery and Neurology follow ups.       Electronically signed: Dr. Caryl Pina 06/24/2023, 8:25 AM

## 2023-06-24 NOTE — Evaluation (Signed)
Occupational Therapy Evaluation Patient Details Name: Kathy Baldwin MRN: [id number] DOB: [birth year] Today's Date: 06/24/2023   History of Present Illness Pt is a 77 y.o. female presenting to hospital 06/23/23 with generalized weakness as well as L arm and L leg weakness.  Also c/o pain in L arm (acute; s/p fall at home) and L leg.  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  L wrist x-ray: "Thin curvilinear density in this  region may represent soft tissue calcification versus subtle  triquetral fracture".   Pt admitted with stroke-like symptom, dysuria.  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   Clinical Impression   Pt was seen for OT evaluation this date. Prior to hospital admission, pt was independent in ADLs/IADLs. Pt lives in 2 level home with husband, family mentioned possible returning to heal at her daughter's home. The daughter's home is one level and able to be present with her 24/7. On this date Pt allowed family to answer most questions, she presented flat affect; will continue to assess cognition. Pt presents to acute OT demonstrating impaired ADL performance and functional mobility (See OT problem list for additional functional deficits). Pt stated she is experiencing pain in both LE as well as acute weakness in her L side. During bed mobility (elevated HOB); supine to sit: MINA - MODA +2 for physical assistance, sit to supine: MINA + 2. Pt currently requires MINA - MODA +2 to STS at bed side + L platform RW. Pt able to tolerate standing at bed side for ~ 1 min; taking 4 forward steps and 1 side step before needing to sit down. Pt would benefit from skilled OT services to address noted impairments and functional limitations (see below for any additional details) in order to maximize safety and independence while minimizing falls risk and caregiver burden. OT will follow acutely.       If plan is discharge home, recommend the  following: Assistance with cooking/housework;Assist for transportation;Help with stairs or ramp for entrance;A lot of help with walking and/or transfers;A lot of help with bathing/dressing/bathroom    Functional Status Assessment  Patient has had a recent decline in their functional status and demonstrates the ability to make significant improvements in function in a reasonable and predictable amount of time.  Equipment Recommendations  Other (comment) (Platform rolling walker)    Recommendations for Other Services       Precautions / Restrictions Precautions Precautions: Fall Required Braces or Orthoses: Splint/Cast Splint/Cast: L wrist splint Restrictions Weight Bearing Restrictions: Yes LUE Weight Bearing: Non weight bearing Other Position/Activity Restrictions: NWB L wrist/hand; can use platform walker      Mobility Bed Mobility Overal bed mobility: Needs Assistance Bed Mobility: Supine to Sit, Sit to Supine     Supine to sit: Min assist, Mod assist, +2 for physical assistance Sit to supine: Min assist, +2 for safety/equipment, HOB elevated   General bed mobility comments: Assist for LE management; use of chuck sheet for scooting up in bed +2 assist    Transfers Overall transfer level: Needs assistance Equipment used: Rolling walker (2 wheels) Transfers: Sit to/from Stand Sit to Stand: Min assist, Mod assist, +2 physical assistance                  Balance Overall balance assessment: Needs assistance Sitting-balance support: No upper extremity supported, Feet supported Sitting balance-Leahy Scale: Poor Sitting balance - Comments: Pt unable to sit up for vision assessment -  posterior lean Postural control: Posterior lean Standing balance support: Bilateral upper extremity supported, Reliant on assistive device for balance Standing balance-Leahy Scale: Fair Standing balance comment: steady static standing using L platform RW                            ADL either performed or assessed with clinical judgement   ADL Overall ADL's : Needs assistance/impaired                           Toilet Transfer Details (indicate cue type and reason): Daughter reports Pt used the [facility] prior to eval with lots of assistance.         Functional mobility during ADLs: Minimal assistance;Moderate assistance;+2 for safety/equipment;Cueing for sequencing;Rolling walker (2 wheels) approx 4 ft forward and back        Vision Baseline Vision/History: 1 Wears glasses Patient Visual Report: No change from baseline Vision Assessment?: Yes Ocular Range of Motion: Within Functional Limits Tracking/Visual Pursuits: Able to track stimulus in all quads without difficulty Saccades: Within functional limits Convergence: Within functional limits Visual Fields: No apparent deficits Additional Comments: will continue to assess     Perception Perception: Within Functional Limits (will continue to assess)       Praxis Praxis: WFL (will continue to assess)       Pertinent Vitals/Pain Pain Assessment Pain Assessment: Faces Faces Pain Scale: Hurts a little bit Pain Location: L wrist, R great toe (from gout), and L lateral proximal lower leg Pain Descriptors / Indicators: Tender, Discomfort, Sore Pain Intervention(s): Limited activity within patient's tolerance, Monitored during session     Extremity/Trunk Assessment Upper Extremity Assessment Upper Extremity Assessment: LUE deficits/detail LUE Deficits / Details: L wrist splint in place LUE: Unable to fully assess due to immobilization- pt reports weakness throughout, will continue to assess    Lower Extremity Assessment Lower Extremity Assessment: RLE deficits/detail RLE Deficits / Details:limited d/t pain radiating down to great toe RLE: Unable to fully assess due to pain LLE: Unable to fully assess due to pain LLE Sensation: decreased light touch    Cervical / Trunk Assessment Cervical /  Trunk Assessment: Normal   Communication Communication Communication: Hearing impairment;Difficulty following commands/understanding;Difficulty communicating thoughts/reduced clarity of speech Following commands: Follows one step commands with increased time Cueing Techniques: Verbal cues   Cognition Arousal: Alert Behavior During Therapy: Flat affect Overall Cognitive Status: Impaired/Different from baseline Area of Impairment: Attention, Following commands, Safety/judgement, Awareness                   Current Attention Level: Sustained   Following Commands: Follows one step commands with increased time Safety/Judgement: Decreased awareness of deficits Awareness: Emergent Problem Solving: Slow processing, Requires verbal cues, Requires tactile cues General Comments: pt does not have hearing aids, may have impacted participation, will continue to assess     General Comments  Assistance with correct splint placement    Exercises Other Exercises Other Exercises: Edu: home modifications, role of OT, role of rehab, safe ADL completion, DME/AD recommendations for d/c   Shoulder Instructions      Home Living Family/patient expects to be discharged to:: Private residence Living Arrangements: Spouse/significant other Available Help at Discharge: Available 24 hours/day Type of Home: House Home Access: Stairs to enter [employer] of Steps: 2 from, 0 steps in back (main doorway they use) Entrance Stairs-Rails: Left Home Layout: Two level Alternate Level Stairs-Number  of Steps: living area/bed in basement, shower and kitchen are upstairs   Bathroom Shower/Tub: Tub/shower unit;Door   [employer] Toilet: Standard     Home Equipment: Hand held shower head   Additional Comments: 1/2 bath in basement; full bath on main level.  Pt's daughter lives close by and has 1 level home with level entry if needed.      Prior Functioning/Environment Prior Level of Function :  History of Falls (last six months);Working/employed;Driving;Independent/Modified Independent             Mobility Comments: Independent prior to hospitalization.  1 fall a few weeks ago.  Takes care of her dog. ADLs Comments: PTA Pt reports indep ADL/IADLs. Daughter home (hoping to stay after d/c): all one level, open floor plan, walk in  shower, standard toilets        OT Problem List: Decreased strength;Decreased coordination;Decreased activity tolerance;Decreased knowledge of use of DME or AE;Impaired sensation;Decreased cognition;Impaired balance (sitting and/or standing);Decreased safety awareness      OT Treatment/Interventions: Self-care/ADL training;Therapeutic exercise;Balance training;DME and/or AE instruction;Energy conservation;Patient/family education    OT Goals(Current goals can be found in the care plan section) Acute Rehab OT Goals Patient Stated Goal: to go to rehab OT Goal Formulation: With patient/family Time For Goal Achievement: 07/08/23 Potential to Achieve Goals: Good ADL Goals Pt Will Perform Grooming: with modified independence;standing Pt Will Perform Lower Body Dressing: with modified independence;sitting/lateral leans Pt Will Transfer to Toilet: with modified independence;ambulating Pt Will Perform Toileting - Clothing Manipulation and hygiene: with modified independence;sitting/lateral leans  OT Frequency: Min 1X/week    Co-evaluation              AM-PAC OT "6 Clicks" Daily Activity     Outcome Measure Help from another person eating meals?: A Little Help from another person taking care of personal grooming?: A Lot Help from another person toileting, which includes using toliet, bedpan, or urinal?: A Lot Help from another person bathing (including washing, rinsing, drying)?: A Lot Help from another person to put on and taking off regular upper body clothing?: A Little Help from another person to put on and taking off regular lower body  clothing?: A Lot 6 Click Score: 14   End of Session Equipment Utilized During Treatment: Gait belt;Rolling walker (2 wheels);Other (comment) (L wrist splint)  Activity Tolerance: Patient tolerated treatment well Patient left: in bed;with call bell/phone within reach;with bed alarm set;with family/visitor present  OT Visit Diagnosis: Other abnormalities of gait and mobility (R26.89);Unsteadiness on feet (R26.81);Repeated falls (R29.6)                Time: 1610-9604 OT Time Calculation (min): 38 min Charges:  OT General Charges $OT Visit: 1 Visit OT Evaluation $OT Eval Moderate Complexity: 1 Mod  [employer], OTS

## 2023-06-25 DIAGNOSIS — R2990 Unspecified symptoms and signs involving the nervous system: Secondary | ICD-10-CM | POA: Diagnosis not present

## 2023-06-25 LAB — CBC
HCT: 37.5 % (ref 36.0–46.0)
Hemoglobin: 12.3 g/dL (ref 12.0–15.0)
MCH: 30.1 pg (ref 26.0–34.0)
MCHC: 32.8 g/dL (ref 30.0–36.0)
MCV: 91.7 fL (ref 80.0–100.0)
Platelets: 228 10*3/uL (ref 150–400)
RBC: 4.09 MIL/uL (ref 3.87–5.11)
RDW: 13.7 % (ref 11.5–15.5)
WBC: 8.7 10*3/uL (ref 4.0–10.5)
nRBC: 0 % (ref 0.0–0.2)

## 2023-06-25 LAB — ECHOCARDIOGRAM COMPLETE
AR max vel: 2.16 cm2
AV Area VTI: 2.12 cm2
AV Area mean vel: 2.03 cm2
AV Mean grad: 3.5 mm[Hg]
AV Peak grad: 6.8 mm[Hg]
Ao pk vel: 1.3 m/s
Area-P 1/2: 3.23 cm2
Height: 62 in
S' Lateral: 2.4 cm
Weight: 2222.24 [oz_av]

## 2023-06-25 LAB — BASIC METABOLIC PANEL
Anion gap: 7 (ref 5–15)
BUN: 26 mg/dL — ABNORMAL HIGH (ref 8–23)
CO2: 23 mmol/L (ref 22–32)
Calcium: 8.8 mg/dL — ABNORMAL LOW (ref 8.9–10.3)
Chloride: 110 mmol/L (ref 98–111)
Creatinine, Ser: 0.81 mg/dL (ref 0.44–1.00)
GFR, Estimated: >60 mL/min (ref >60)
Glucose, Bld: 113 mg/dL — ABNORMAL HIGH (ref 70–99)
Potassium: 3.6 mmol/L (ref 3.5–5.1)
Sodium: 140 mmol/L (ref 135–145)

## 2023-06-25 MED ORDER — AMLODIPINE BESYLATE 5 MG PO TABS
2.5000 mg | ORAL_TABLET | Freq: Every day | ORAL | Status: DC
  Administered 2023-06-25: 2.5 mg via ORAL
  Filled 2023-06-25: qty 1

## 2023-06-25 MED ORDER — PREDNISONE 20 MG PO TABS: 20.0000 mg | ORAL_TABLET | Freq: Every day | ORAL | 0 refills | Status: AC

## 2023-06-25 MED ORDER — ROSUVASTATIN CALCIUM 20 MG PO TABS: ORAL_TABLET | ORAL | 1 refills | Status: DC

## 2023-06-25 NOTE — Discharge Summary (Signed)
Take 1 tablet (25 mg total) by mouth daily.   multivitamin tablet Take 1 tablet by mouth daily.   pantoprazole 40 MG tablet Commonly known as: PROTONIX Take 40 mg by mouth daily.   PediaSure/Fiber Liqd Take 10 mLs by mouth.   polyethylene glycol 17 g packet Commonly known as: MIRALAX / GLYCOLAX Take 8.5-17 g by mouth daily as needed.   predniSONE 20 MG tablet Commonly known as: DELTASONE Take 1 tablet (20 mg total) by mouth daily with breakfast for 3 days. Start taking on: June 26, 2023   rosuvastatin 20 MG tablet Commonly known as: CRESTOR TAKE 1 TABLET BY MOUTH AT BEDTIME What changed: medication strength               Durable Medical Equipment  (From admission, onward)           Start     Ordered   06/25/23 1256  For home use only DME 3 n 1  Once        06/25/23 1255   06/25/23 1241  For home use only DME Other  see comment  Once       Comments: L platform rolling walker  Question:  Length of Need  Answer:  6 Months   06/25/23 1240            Discharge Exam: Filed Weights   06/23/23 0824  Weight: 63 kg   General exam: awake, alert, no acute distress HEENT: moist mucus membranes, hearing grossly normal  Respiratory system: CTA, no wheezes, rales or rhonchi, normal respiratory effort. Cardiovascular system: normal S1/S2, RRR,no pedal edema.   Gastrointestinal system: soft, NT, ND, no HSM felt, +bowel sounds. Central nervous system: A&O x 3. no gross focal neurologic deficits, normal speech Extremities: moves all, no edema, normal tone Skin: dry, intact, normal temperature Psychiatry: normal mood, congruent affect, judgement and insight appear normal   Condition at discharge: stable  The results of significant diagnostics from this hospitalization (including imaging, microbiology, ancillary and laboratory) are listed below for reference.   Imaging Studies: ECHOCARDIOGRAM COMPLETE  Result Date: 06/25/2023    ECHOCARDIOGRAM REPORT   Patient Name:   Kathy Baldwin Fabry Date of Exam: 06/24/2023 Medical Rec #:  [id number]           Height:       62.0 in Accession #:    [id number]          Weight:       138.9 lb Date of Birth:  1946/07/18           BSA:          1.637 m Patient Age:    77 years            BP:           134/56 mmHg Patient Gender: F                   HR:           58 bpm. Exam Location:  [facility] Procedure: 2D Echo, Cardiac Doppler, Color Doppler and Saline Contrast Bubble            Study Indications:     Stroke I63.9  History:         Patient has prior history of Echocardiogram examinations, most                  recent 09/12/2021. Risk Factors:Hypertension and Dyslipidemia.  Sonographer:     Dorene Sorrow  Take 1 tablet (25 mg total) by mouth daily.   multivitamin tablet Take 1 tablet by mouth daily.   pantoprazole 40 MG tablet Commonly known as: PROTONIX Take 40 mg by mouth daily.   PediaSure/Fiber Liqd Take 10 mLs by mouth.   polyethylene glycol 17 g packet Commonly known as: MIRALAX / GLYCOLAX Take 8.5-17 g by mouth daily as needed.   predniSONE 20 MG tablet Commonly known as: DELTASONE Take 1 tablet (20 mg total) by mouth daily with breakfast for 3 days. Start taking on: June 26, 2023   rosuvastatin 20 MG tablet Commonly known as: CRESTOR TAKE 1 TABLET BY MOUTH AT BEDTIME What changed: medication strength               Durable Medical Equipment  (From admission, onward)           Start     Ordered   06/25/23 1256  For home use only DME 3 n 1  Once        06/25/23 1255   06/25/23 1241  For home use only DME Other  see comment  Once       Comments: L platform rolling walker  Question:  Length of Need  Answer:  6 Months   06/25/23 1240            Discharge Exam: Filed Weights   06/23/23 0824  Weight: 63 kg   General exam: awake, alert, no acute distress HEENT: moist mucus membranes, hearing grossly normal  Respiratory system: CTA, no wheezes, rales or rhonchi, normal respiratory effort. Cardiovascular system: normal S1/S2, RRR,no pedal edema.   Gastrointestinal system: soft, NT, ND, no HSM felt, +bowel sounds. Central nervous system: A&O x 3. no gross focal neurologic deficits, normal speech Extremities: moves all, no edema, normal tone Skin: dry, intact, normal temperature Psychiatry: normal mood, congruent affect, judgement and insight appear normal   Condition at discharge: stable  The results of significant diagnostics from this hospitalization (including imaging, microbiology, ancillary and laboratory) are listed below for reference.   Imaging Studies: ECHOCARDIOGRAM COMPLETE  Result Date: 06/25/2023    ECHOCARDIOGRAM REPORT   Patient Name:   Kathy Baldwin Fabry Date of Exam: 06/24/2023 Medical Rec #:  [id number]           Height:       62.0 in Accession #:    [id number]          Weight:       138.9 lb Date of Birth:  1946/07/18           BSA:          1.637 m Patient Age:    77 years            BP:           134/56 mmHg Patient Gender: F                   HR:           58 bpm. Exam Location:  [facility] Procedure: 2D Echo, Cardiac Doppler, Color Doppler and Saline Contrast Bubble            Study Indications:     Stroke I63.9  History:         Patient has prior history of Echocardiogram examinations, most                  recent 09/12/2021. Risk Factors:Hypertension and Dyslipidemia.  Sonographer:     Dorene Sorrow  Physician Discharge Summary   Patient: Kathy Baldwin MRN: [id number] DOB: 1945/10/03  Admit date:     06/23/2023  Discharge date: 06/25/2023  Discharge Physician: Pennie Banter   PCP: [name], MD   Recommendations at discharge:   Follow up with Primary Care in 1-2 weeks Follow up on tolerance for increased Crestor dose of 20 mg, recommended by Neurology Follow up outpatient with Vascular surgery regarding R ICA stenosis for potential intervention Follow up outpatient with Neurology Repeat CMP, CBC, CK at follow up  Discharge Diagnoses: Principal Problem:   Stroke-like symptom Active Problems:   Hyperlipidemia   Hypertension   GERD (gastroesophageal reflux disease)   OSA on CPAP   Decreased hearing, bilateral   Aortic atherosclerosis (HCC)   DNR no code (do not resuscitate)   History of cerebellar stroke   Carotid stenosis, bilateral   Mild dementia (HCC)   Bradycardia   MDD (major depressive disorder), single episode, mild (HCC)   Dysuria   Acute CVA (cerebrovascular accident) (HCC)   Acute idiopathic gout   Wrist fracture, closed, left, sequela   Acute cerebrovascular accident (CVA) due to ischemia [facility])  Resolved Problems:   * No resolved hospital problems. *  Hospital Course:  Kathy Baldwin is a 77 year old female with history of hyperlipidemia, hypertension, GERD, OSA on CPAP, history of memory deficit/mild dementia. They presented to the ED for chief concerns of altered mental status and left-sided weakness which was present upon waking up. Patient reports the left sided weakness is persistent and has not improved since ED presentation.   ED course: temperature of 97.9, respiration rate of 15, heart rate of 55, blood pressure 119/49, SpO2 93% on room air. Serum sodium is 138, potassium 3.7, chloride 105, bicarb 25, BUN of 24, serum creatinine 1.13, EGFR 50, nonfasting blood glucose 115, WBC 9.8, hemoglobin 12.4, platelets of 226.  T. bili  was 1.3.  High sensitive troponin was 8 and on repeat was 9. CTA head and neck w and wo: negative for large vessel occlusion.  Chronic atherosclerosis appears progressed at the right ICA siphon supraclinoid segment since last year, now moderate to severe stenosis there.  No other significant stenosis on CTA of the head and neck.  Aortic atherosclerosis.  Chronic small vessel disease, chronic small cerebellar infarcts. MRI brain: Small acute right basal ganglia infarct. Moderate chronic small vessel ischemic disease. Chronic cerebellar infarcts.   Neurology was consulted.    Patient was admitted to medicine service for further workup and management of stroke as outlined in detail below.    06/25/23 -- medically stable and cleared by consultants for discharge today, home with home health services   Assessment and Plan:  Acute CVA- with residual left-sided weakness.  Small acute right basal ganglia infarct seen on brain MRI.  History of cerebellar stroke - neurology consulted, appreciate your recs - PT/OT/SLP -- HH recommended and ordered - ASA & Plavix, increase Crestor to 20 mg - Risk factor modification - Follow up outpatient with Neurology   R internal carotid artery stenosis - as seen on vascular imaging for stroke evaluation. - outpatient follow up with vascular surgery recommended for consideration of intervention  L wrist fracture POA- occurred 06/02/2023. Wearing a brace and having tenderness with flexion and extension. Swelling and tenderness to palpation. Repeat xray on admission showed "subtle triquetral fracture" vs soft-tissue calcification. This is consistent with prior imaging - continue to wear brace when ambulating and avoid weight-bearing to the wrist.  - OT  Take 1 tablet (25 mg total) by mouth daily.   multivitamin tablet Take 1 tablet by mouth daily.   pantoprazole 40 MG tablet Commonly known as: PROTONIX Take 40 mg by mouth daily.   PediaSure/Fiber Liqd Take 10 mLs by mouth.   polyethylene glycol 17 g packet Commonly known as: MIRALAX / GLYCOLAX Take 8.5-17 g by mouth daily as needed.   predniSONE 20 MG tablet Commonly known as: DELTASONE Take 1 tablet (20 mg total) by mouth daily with breakfast for 3 days. Start taking on: June 26, 2023   rosuvastatin 20 MG tablet Commonly known as: CRESTOR TAKE 1 TABLET BY MOUTH AT BEDTIME What changed: medication strength               Durable Medical Equipment  (From admission, onward)           Start     Ordered   06/25/23 1256  For home use only DME 3 n 1  Once        06/25/23 1255   06/25/23 1241  For home use only DME Other  see comment  Once       Comments: L platform rolling walker  Question:  Length of Need  Answer:  6 Months   06/25/23 1240            Discharge Exam: Filed Weights   06/23/23 0824  Weight: 63 kg   General exam: awake, alert, no acute distress HEENT: moist mucus membranes, hearing grossly normal  Respiratory system: CTA, no wheezes, rales or rhonchi, normal respiratory effort. Cardiovascular system: normal S1/S2, RRR,no pedal edema.   Gastrointestinal system: soft, NT, ND, no HSM felt, +bowel sounds. Central nervous system: A&O x 3. no gross focal neurologic deficits, normal speech Extremities: moves all, no edema, normal tone Skin: dry, intact, normal temperature Psychiatry: normal mood, congruent affect, judgement and insight appear normal   Condition at discharge: stable  The results of significant diagnostics from this hospitalization (including imaging, microbiology, ancillary and laboratory) are listed below for reference.   Imaging Studies: ECHOCARDIOGRAM COMPLETE  Result Date: 06/25/2023    ECHOCARDIOGRAM REPORT   Patient Name:   Kathy Baldwin Fabry Date of Exam: 06/24/2023 Medical Rec #:  [id number]           Height:       62.0 in Accession #:    [id number]          Weight:       138.9 lb Date of Birth:  1946/07/18           BSA:          1.637 m Patient Age:    77 years            BP:           134/56 mmHg Patient Gender: F                   HR:           58 bpm. Exam Location:  [facility] Procedure: 2D Echo, Cardiac Doppler, Color Doppler and Saline Contrast Bubble            Study Indications:     Stroke I63.9  History:         Patient has prior history of Echocardiogram examinations, most                  recent 09/12/2021. Risk Factors:Hypertension and Dyslipidemia.  Sonographer:     Dorene Sorrow  Take 1 tablet (25 mg total) by mouth daily.   multivitamin tablet Take 1 tablet by mouth daily.   pantoprazole 40 MG tablet Commonly known as: PROTONIX Take 40 mg by mouth daily.   PediaSure/Fiber Liqd Take 10 mLs by mouth.   polyethylene glycol 17 g packet Commonly known as: MIRALAX / GLYCOLAX Take 8.5-17 g by mouth daily as needed.   predniSONE 20 MG tablet Commonly known as: DELTASONE Take 1 tablet (20 mg total) by mouth daily with breakfast for 3 days. Start taking on: June 26, 2023   rosuvastatin 20 MG tablet Commonly known as: CRESTOR TAKE 1 TABLET BY MOUTH AT BEDTIME What changed: medication strength               Durable Medical Equipment  (From admission, onward)           Start     Ordered   06/25/23 1256  For home use only DME 3 n 1  Once        06/25/23 1255   06/25/23 1241  For home use only DME Other  see comment  Once       Comments: L platform rolling walker  Question:  Length of Need  Answer:  6 Months   06/25/23 1240            Discharge Exam: Filed Weights   06/23/23 0824  Weight: 63 kg   General exam: awake, alert, no acute distress HEENT: moist mucus membranes, hearing grossly normal  Respiratory system: CTA, no wheezes, rales or rhonchi, normal respiratory effort. Cardiovascular system: normal S1/S2, RRR,no pedal edema.   Gastrointestinal system: soft, NT, ND, no HSM felt, +bowel sounds. Central nervous system: A&O x 3. no gross focal neurologic deficits, normal speech Extremities: moves all, no edema, normal tone Skin: dry, intact, normal temperature Psychiatry: normal mood, congruent affect, judgement and insight appear normal   Condition at discharge: stable  The results of significant diagnostics from this hospitalization (including imaging, microbiology, ancillary and laboratory) are listed below for reference.   Imaging Studies: ECHOCARDIOGRAM COMPLETE  Result Date: 06/25/2023    ECHOCARDIOGRAM REPORT   Patient Name:   Kathy Baldwin Fabry Date of Exam: 06/24/2023 Medical Rec #:  [id number]           Height:       62.0 in Accession #:    [id number]          Weight:       138.9 lb Date of Birth:  1946/07/18           BSA:          1.637 m Patient Age:    77 years            BP:           134/56 mmHg Patient Gender: F                   HR:           58 bpm. Exam Location:  [facility] Procedure: 2D Echo, Cardiac Doppler, Color Doppler and Saline Contrast Bubble            Study Indications:     Stroke I63.9  History:         Patient has prior history of Echocardiogram examinations, most                  recent 09/12/2021. Risk Factors:Hypertension and Dyslipidemia.  Sonographer:     Dorene Sorrow  Take 1 tablet (25 mg total) by mouth daily.   multivitamin tablet Take 1 tablet by mouth daily.   pantoprazole 40 MG tablet Commonly known as: PROTONIX Take 40 mg by mouth daily.   PediaSure/Fiber Liqd Take 10 mLs by mouth.   polyethylene glycol 17 g packet Commonly known as: MIRALAX / GLYCOLAX Take 8.5-17 g by mouth daily as needed.   predniSONE 20 MG tablet Commonly known as: DELTASONE Take 1 tablet (20 mg total) by mouth daily with breakfast for 3 days. Start taking on: June 26, 2023   rosuvastatin 20 MG tablet Commonly known as: CRESTOR TAKE 1 TABLET BY MOUTH AT BEDTIME What changed: medication strength               Durable Medical Equipment  (From admission, onward)           Start     Ordered   06/25/23 1256  For home use only DME 3 n 1  Once        06/25/23 1255   06/25/23 1241  For home use only DME Other  see comment  Once       Comments: L platform rolling walker  Question:  Length of Need  Answer:  6 Months   06/25/23 1240            Discharge Exam: Filed Weights   06/23/23 0824  Weight: 63 kg   General exam: awake, alert, no acute distress HEENT: moist mucus membranes, hearing grossly normal  Respiratory system: CTA, no wheezes, rales or rhonchi, normal respiratory effort. Cardiovascular system: normal S1/S2, RRR,no pedal edema.   Gastrointestinal system: soft, NT, ND, no HSM felt, +bowel sounds. Central nervous system: A&O x 3. no gross focal neurologic deficits, normal speech Extremities: moves all, no edema, normal tone Skin: dry, intact, normal temperature Psychiatry: normal mood, congruent affect, judgement and insight appear normal   Condition at discharge: stable  The results of significant diagnostics from this hospitalization (including imaging, microbiology, ancillary and laboratory) are listed below for reference.   Imaging Studies: ECHOCARDIOGRAM COMPLETE  Result Date: 06/25/2023    ECHOCARDIOGRAM REPORT   Patient Name:   Kathy Baldwin Fabry Date of Exam: 06/24/2023 Medical Rec #:  [id number]           Height:       62.0 in Accession #:    [id number]          Weight:       138.9 lb Date of Birth:  1946/07/18           BSA:          1.637 m Patient Age:    77 years            BP:           134/56 mmHg Patient Gender: F                   HR:           58 bpm. Exam Location:  [facility] Procedure: 2D Echo, Cardiac Doppler, Color Doppler and Saline Contrast Bubble            Study Indications:     Stroke I63.9  History:         Patient has prior history of Echocardiogram examinations, most                  recent 09/12/2021. Risk Factors:Hypertension and Dyslipidemia.  Sonographer:     Dorene Sorrow  Physician Discharge Summary   Patient: Kathy Baldwin MRN: [id number] DOB: 1945/10/03  Admit date:     06/23/2023  Discharge date: 06/25/2023  Discharge Physician: Pennie Banter   PCP: [name], MD   Recommendations at discharge:   Follow up with Primary Care in 1-2 weeks Follow up on tolerance for increased Crestor dose of 20 mg, recommended by Neurology Follow up outpatient with Vascular surgery regarding R ICA stenosis for potential intervention Follow up outpatient with Neurology Repeat CMP, CBC, CK at follow up  Discharge Diagnoses: Principal Problem:   Stroke-like symptom Active Problems:   Hyperlipidemia   Hypertension   GERD (gastroesophageal reflux disease)   OSA on CPAP   Decreased hearing, bilateral   Aortic atherosclerosis (HCC)   DNR no code (do not resuscitate)   History of cerebellar stroke   Carotid stenosis, bilateral   Mild dementia (HCC)   Bradycardia   MDD (major depressive disorder), single episode, mild (HCC)   Dysuria   Acute CVA (cerebrovascular accident) (HCC)   Acute idiopathic gout   Wrist fracture, closed, left, sequela   Acute cerebrovascular accident (CVA) due to ischemia [facility])  Resolved Problems:   * No resolved hospital problems. *  Hospital Course:  Kathy Baldwin is a 77 year old female with history of hyperlipidemia, hypertension, GERD, OSA on CPAP, history of memory deficit/mild dementia. They presented to the ED for chief concerns of altered mental status and left-sided weakness which was present upon waking up. Patient reports the left sided weakness is persistent and has not improved since ED presentation.   ED course: temperature of 97.9, respiration rate of 15, heart rate of 55, blood pressure 119/49, SpO2 93% on room air. Serum sodium is 138, potassium 3.7, chloride 105, bicarb 25, BUN of 24, serum creatinine 1.13, EGFR 50, nonfasting blood glucose 115, WBC 9.8, hemoglobin 12.4, platelets of 226.  T. bili  was 1.3.  High sensitive troponin was 8 and on repeat was 9. CTA head and neck w and wo: negative for large vessel occlusion.  Chronic atherosclerosis appears progressed at the right ICA siphon supraclinoid segment since last year, now moderate to severe stenosis there.  No other significant stenosis on CTA of the head and neck.  Aortic atherosclerosis.  Chronic small vessel disease, chronic small cerebellar infarcts. MRI brain: Small acute right basal ganglia infarct. Moderate chronic small vessel ischemic disease. Chronic cerebellar infarcts.   Neurology was consulted.    Patient was admitted to medicine service for further workup and management of stroke as outlined in detail below.    06/25/23 -- medically stable and cleared by consultants for discharge today, home with home health services   Assessment and Plan:  Acute CVA- with residual left-sided weakness.  Small acute right basal ganglia infarct seen on brain MRI.  History of cerebellar stroke - neurology consulted, appreciate your recs - PT/OT/SLP -- HH recommended and ordered - ASA & Plavix, increase Crestor to 20 mg - Risk factor modification - Follow up outpatient with Neurology   R internal carotid artery stenosis - as seen on vascular imaging for stroke evaluation. - outpatient follow up with vascular surgery recommended for consideration of intervention  L wrist fracture POA- occurred 06/02/2023. Wearing a brace and having tenderness with flexion and extension. Swelling and tenderness to palpation. Repeat xray on admission showed "subtle triquetral fracture" vs soft-tissue calcification. This is consistent with prior imaging - continue to wear brace when ambulating and avoid weight-bearing to the wrist.  - OT  Physician Discharge Summary   Patient: Kathy Baldwin MRN: [id number] DOB: 1945/10/03  Admit date:     06/23/2023  Discharge date: 06/25/2023  Discharge Physician: Pennie Banter   PCP: [name], MD   Recommendations at discharge:   Follow up with Primary Care in 1-2 weeks Follow up on tolerance for increased Crestor dose of 20 mg, recommended by Neurology Follow up outpatient with Vascular surgery regarding R ICA stenosis for potential intervention Follow up outpatient with Neurology Repeat CMP, CBC, CK at follow up  Discharge Diagnoses: Principal Problem:   Stroke-like symptom Active Problems:   Hyperlipidemia   Hypertension   GERD (gastroesophageal reflux disease)   OSA on CPAP   Decreased hearing, bilateral   Aortic atherosclerosis (HCC)   DNR no code (do not resuscitate)   History of cerebellar stroke   Carotid stenosis, bilateral   Mild dementia (HCC)   Bradycardia   MDD (major depressive disorder), single episode, mild (HCC)   Dysuria   Acute CVA (cerebrovascular accident) (HCC)   Acute idiopathic gout   Wrist fracture, closed, left, sequela   Acute cerebrovascular accident (CVA) due to ischemia [facility])  Resolved Problems:   * No resolved hospital problems. *  Hospital Course:  Kathy Baldwin is a 77 year old female with history of hyperlipidemia, hypertension, GERD, OSA on CPAP, history of memory deficit/mild dementia. They presented to the ED for chief concerns of altered mental status and left-sided weakness which was present upon waking up. Patient reports the left sided weakness is persistent and has not improved since ED presentation.   ED course: temperature of 97.9, respiration rate of 15, heart rate of 55, blood pressure 119/49, SpO2 93% on room air. Serum sodium is 138, potassium 3.7, chloride 105, bicarb 25, BUN of 24, serum creatinine 1.13, EGFR 50, nonfasting blood glucose 115, WBC 9.8, hemoglobin 12.4, platelets of 226.  T. bili  was 1.3.  High sensitive troponin was 8 and on repeat was 9. CTA head and neck w and wo: negative for large vessel occlusion.  Chronic atherosclerosis appears progressed at the right ICA siphon supraclinoid segment since last year, now moderate to severe stenosis there.  No other significant stenosis on CTA of the head and neck.  Aortic atherosclerosis.  Chronic small vessel disease, chronic small cerebellar infarcts. MRI brain: Small acute right basal ganglia infarct. Moderate chronic small vessel ischemic disease. Chronic cerebellar infarcts.   Neurology was consulted.    Patient was admitted to medicine service for further workup and management of stroke as outlined in detail below.    06/25/23 -- medically stable and cleared by consultants for discharge today, home with home health services   Assessment and Plan:  Acute CVA- with residual left-sided weakness.  Small acute right basal ganglia infarct seen on brain MRI.  History of cerebellar stroke - neurology consulted, appreciate your recs - PT/OT/SLP -- HH recommended and ordered - ASA & Plavix, increase Crestor to 20 mg - Risk factor modification - Follow up outpatient with Neurology   R internal carotid artery stenosis - as seen on vascular imaging for stroke evaluation. - outpatient follow up with vascular surgery recommended for consideration of intervention  L wrist fracture POA- occurred 06/02/2023. Wearing a brace and having tenderness with flexion and extension. Swelling and tenderness to palpation. Repeat xray on admission showed "subtle triquetral fracture" vs soft-tissue calcification. This is consistent with prior imaging - continue to wear brace when ambulating and avoid weight-bearing to the wrist.  - OT

## 2023-06-25 NOTE — TOC Transition Note (Signed)
Transition of Care [facility]) - CM/SW Discharge Note   Patient Details  Name: Kathy Baldwin MRN: [id number] Date of Birth: 10/01/1945  Transition of Care [facility]) CM/SW Contact:  Allena Katz, LCSW Phone Number: 06/25/2023, 2:41 PM   Clinical Narrative:   Pt discharging to daughters home with [facility]. Adelina Mings with [facility] notified. BSC and RW to be delivered by [facility] with [employer] to bedside.     Final next level of care: Home w Home Health Services Barriers to Discharge: Barriers Resolved   Patient Goals and CMS Choice CMS Medicare.gov Compare Post Acute Care list provided to:: Patient Choice offered to / list presented to : Patient  Discharge Placement                  Patient to be transferred to facility by: spouse Name of family member notified: spouse Patient and family notified of of transfer: 06/25/23  Discharge Plan and Services Additional resources added to the After Visit Summary for       Post Acute Care Choice: Durable Medical Equipment          DME Arranged: 3-N-1, Walker rolling with seat DME Agency: [employer] Date DME Agency Contacted: 06/25/23   Representative spoke with at DME Agency: mitch HH Arranged: PT, OT          Social Determinants of Health (SDOH) Interventions SDOH Screenings   Food Insecurity: No Food Insecurity (06/23/2023)  Housing: Low Risk  (06/23/2023)  Transportation Needs: No Transportation Needs (06/23/2023)  Utilities: Not At Risk (06/23/2023)  Alcohol Screen: Low Risk  (03/13/2022)  Depression (PHQ2-9): Low Risk  (06/02/2023)  Financial Resource Strain: Low Risk  (05/01/2023)  Physical Activity: Inactive (05/01/2023)  Social Connections: Moderately Isolated (05/01/2023)  Stress: No Stress Concern Present (05/01/2023)  Tobacco Use: Low Risk  (06/23/2023)  Health Literacy: Adequate Health Literacy (05/01/2023)     Readmission Risk Interventions     No data to display

## 2023-06-25 NOTE — TOC Initial Note (Addendum)
Transition of Care [facility]) - Initial/Assessment Note    Patient Details  Name: Kathy Baldwin MRN: [id number] Date of Birth: 1946/05/17  Transition of Care [facility]) CM/SW Contact:    Allena Katz, LCSW Phone Number: 06/25/2023, 12:04 PM  Clinical Narrative:     Pt had recommendations for CIR. Recs have now changed to [facility] and wants to go to her daughters with [facility]. Daughters address is [address], [place] [address]. Pt's spouse is requesting BSC and a L platform RW Mitch with [employer] contacted.  [facility] accepted for PT/OT  Expected Discharge Plan: Home w Home Health Services Barriers to Discharge: Barriers Resolved   Patient Goals and CMS Choice Patient states their goals for this hospitalization and ongoing recovery are:: return home with [facility] CMS Medicare.gov Compare Post Acute Care list provided to:: Patient Choice offered to / list presented to : Patient      Expected Discharge Plan and Services     Post Acute Care Choice: Durable Medical Equipment Living arrangements for the past 2 months: Single Family Home                 DME Arranged: 3-N-1, Walker rolling with seat DME Agency: [employer] Date DME Agency Contacted: 06/25/23   Representative spoke with at DME Agency: mitch HH Arranged: PT, OT          Prior Living Arrangements/Services Living arrangements for the past 2 months: Single Family Home Lives with:: Adult Children   Do you feel safe going back to the place where you live?:  (will stay with daughter at d/c)          Current home services: Home OT, Home PT    Activities of Daily Living   ADL Screening (condition at time of admission) Independently performs ADLs?: Yes (appropriate for developmental age) Is the patient deaf or have difficulty hearing?: No Does the patient have difficulty seeing, even when wearing glasses/contacts?: No Does the patient have difficulty concentrating, remembering, or making decisions?: No  Permission  Sought/Granted      Share Information with NAME: husband  Permission granted to share info w AGENCY: Home health        Emotional Assessment       Orientation: : Fluctuating Orientation (Suspected and/or reported Sundowners)      Admission diagnosis:  Weakness [R53.1] Stroke-like symptom [R29.90] Stenosis of right internal carotid artery [I65.21] Acute CVA (cerebrovascular accident) [facility]) [I63.9] Patient Active Problem List   Diagnosis Date Noted   Acute CVA (cerebrovascular accident) (HCC) 06/24/2023   Acute idiopathic gout 06/24/2023   Wrist fracture, closed, left, sequela 06/24/2023   Acute cerebrovascular accident (CVA) due to ischemia (HCC) 06/24/2023   Stroke-like symptom 06/23/2023   Fall with injury 06/02/2023   Right wrist injury, initial encounter 06/02/2023   Renal insufficiency 05/09/2023   Dysuria 02/09/2023   MDD (major depressive disorder), single episode, 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/2023   Peroneal nerve palsy, 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   Loose stools 04/18/2022   Asthma 11/09/2021   Exertional dyspnea 11/06/2021   Other fatigue 09/26/2021   History of stroke 09/11/2021   Bradycardia 04/27/2021   Urge urinary incontinence 03/06/2021   Medicare annual wellness visit, subsequent 03/05/2021   Advanced directives, counseling/discussion 03/05/2021   Seborrheic keratosis 12/13/2020   Mild dementia (HCC) 11/19/2018   Mass of leg, left 10/23/2018   History of cerebellar stroke 10/08/2018   Small vessel disease, 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   Carotid stenosis, bilateral 10/08/2018   Thyroid nodule 04/14/2018   DNR  no code (do not resuscitate) 03/20/2017   Vaginal atrophy 02/18/2017   Aortic atherosclerosis (HCC) 09/05/2016   Decreased hearing, bilateral 12/04/2015   Health maintenance examination 12/04/2015   Hypertension    ASCUS with positive high risk human papillomavirus of vagina    GERD (gastroesophageal reflux disease)    OSA on CPAP    Hypertrophy of nasal turbinates     Hyperlipidemia 03/28/2015   Allergic rhinitis 03/28/2015   Osteopenia 06/09/2013   PCP:  [name], MD Pharmacy:   [facility] [place] [place] - [address] [place] [address] Phone: [phone] Fax: [phone]     Social Determinants of Health (SDOH) Social History: SDOH Screenings   Food Insecurity: No Food Insecurity (06/23/2023)  Housing: Low Risk  (06/23/2023)  Transportation Needs: No Transportation Needs (06/23/2023)  Utilities: Not At Risk (06/23/2023)  Alcohol Screen: Low Risk  (03/13/2022)  Depression (PHQ2-9): Low Risk  (06/02/2023)  Financial Resource Strain: Low Risk  (05/01/2023)  Physical Activity: Inactive (05/01/2023)  Social Connections: Moderately Isolated (05/01/2023)  Stress: No Stress Concern Present (05/01/2023)  Tobacco Use: Low Risk  (06/23/2023)  Health Literacy: Adequate Health Literacy (05/01/2023)   SDOH Interventions:     Readmission Risk Interventions     No data to display

## 2023-06-25 NOTE — Progress Notes (Signed)
  Inpatient Rehabilitation Admissions Coordinator   Spoke with patient and her spouse by phone. Spouse states therapy today is talking HH or OP, not CIR at [facility] in [place] at this time. He feels comfortable with that decision today. I have alerted acute team and TOC. We will not pursue CIR at this time. Please call me with any questions.   Ottie Glazier, RN, MSN Rehab Admissions Coordinator [phone]

## 2023-06-25 NOTE — Progress Notes (Signed)
Physical Therapy Treatment Patient Details Name: Kathy Baldwin MRN: [id number] DOB: 09-Apr-1946 Today's Date: 06/25/2023   History of Present Illness Pt is a 77 y.o. female presenting to hospital 06/23/23 with generalized weakness as well as L arm and L leg weakness.  Also c/o pain in L arm (acute; s/p fall at home) and L leg.  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  L wrist x-ray: "Thin curvilinear density in this  region may represent soft tissue calcification versus subtle  triquetral fracture".   Pt admitted with stroke-like symptom, dysuria.  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    PT Comments  Pt pleasant, motivated and ultimately did quite well with PT session.  She showed improved mobility and transfer independence, and needed only minimal cuing and CGA to successfully complete transitions.  She was pleasantly surprised that she was able to move as much and as well as she did today.  Pt ambulate ~35 ft with platform walker and relatively consistent cadence.  Making good gains, will benefit from continued PT per POC.      If plan is discharge home, recommend the following: A little help with bathing/dressing/bathroom;Assistance with cooking/housework;Direct supervision/assist for medications management;Assist for transportation;Help with stairs or ramp for entrance;A little help with walking and/or transfers   Can travel by private vehicle        Equipment Recommendations   (L platform walker)    Recommendations for Other Services       Precautions / Restrictions Precautions Precautions: Fall Required Braces or Orthoses: Splint/Cast Splint/Cast: L wrist splint, platform walker Restrictions LUE Weight Bearing: Non weight bearing     Mobility  Bed Mobility Overal bed mobility: Needs Assistance Bed Mobility: Supine to Sit, Sit to Supine     Supine to sit: Contact guard     General bed mobility comments: Pt did  better today with most aspects of mobility.  She was slow and hesitant and had definite need of the rail with R UE, but ultimately did not need phyiscal assist to get to sitting.    Transfers Overall transfer level: Needs assistance Equipment used: Rolling walker (2 wheels) Transfers: Sit to/from Stand Sit to Stand: Min assist, +2 physical assistance, Contact guard assist, From elevated surface           General transfer comment: elevated bed ~2", cuing for appropriate set up and sequencing.  Able to rise to standing 2 seperate times w/o physical assist    Ambulation/Gait Ambulation/Gait assistance: Contact guard assist Gait Distance (Feet): 35 Feet Assistive device: Rolling walker (2 wheels)         General Gait Details: Pt reports WBing feeling much more tolerable today.  She showed ability take relatively consistent and appropriate steps with minimal hesitation and good overall tolerance.  WBing through L elbow/platform walker   Stairs             Wheelchair Mobility     Tilt Bed    Modified Rankin (Stroke Patients Only)       Balance Overall balance assessment: Needs assistance Sitting-balance support: No upper extremity supported, Feet supported Sitting balance-Leahy Scale: Good       Standing balance-Leahy Scale: Good Standing balance comment: steady static standing using L platform RW                            Cognition Arousal: Alert Behavior During  Therapy: WFL for tasks assessed/performed Overall Cognitive Status: Impaired/Different from baseline Area of Impairment: Attention, Following commands, Safety/judgement, Awareness                                        Exercises General Exercises - Lower Extremity Ankle Circles/Pumps: AROM, 10 reps Quad Sets: Strengthening, 10 reps Heel Slides: Strengthening, 10 reps Hip ABduction/ADduction: Strengthening, 10 reps    General Comments General comments (skin  integrity, edema, etc.): Pt still with some hesitancy/pain, but ultimately showed great improvement with mobility and activity tolerance today.      Pertinent Vitals/Pain Pain Assessment Pain Assessment: 0-10 Pain Score: 2     Home Living                          Prior Function            PT Goals (current goals can now be found in the care plan section) Progress towards PT goals: Progressing toward goals    Frequency    Min 1X/week      PT Plan      Co-evaluation              AM-PAC PT "6 Clicks" Mobility   Outcome Measure  Help needed turning from your back to your side while in a flat bed without using bedrails?: A Little Help needed moving from lying on your back to sitting on the side of a flat bed without using bedrails?: A Little Help needed moving to and from a bed to a chair (including a wheelchair)?: A Little Help needed standing up from a chair using your arms (e.g., wheelchair or bedside chair)?: A Little Help needed to walk in hospital room?: A Little Help needed climbing 3-5 steps with a railing? : A Lot 6 Click Score: 17    End of Session Equipment Utilized During Treatment: Gait belt Activity Tolerance: Patient limited by pain Patient left: with bed alarm set;with call bell/phone within reach;with family/visitor present Nurse Communication: Mobility status;Precautions;Weight bearing status PT Visit Diagnosis: Other abnormalities of gait and mobility (R26.89);Muscle weakness (generalized) (M62.81);Pain;Hemiplegia and hemiparesis Hemiplegia - Right/Left: Left Hemiplegia - caused by: Cerebral infarction Pain - Right/Left: Left Pain - part of body: Hand     Time: 0102-7253 PT Time Calculation (min) (ACUTE ONLY): 27 min  Charges:    $Gait Training: 8-22 mins $Therapeutic Exercise: 8-22 mins PT General Charges $$ ACUTE PT VISIT: 1 Visit                     Malachi Pro, DPT 06/25/2023, 10:55 AM

## 2023-06-25 NOTE — TOC CM/SW Note (Signed)
Patient is not able to walk the distance required to go the bathroom, or he/she is unable to safely negotiate stairs required to access the bathroom.  A 3in1 BSC will alleviate this problem  

## 2023-06-25 NOTE — Plan of Care (Signed)
  Problem: Education: Goal: Knowledge of disease or condition will improve Outcome: Progressing Goal: Knowledge of secondary prevention will improve (MUST DOCUMENT ALL) Outcome: Progressing Goal: Knowledge of patient specific risk factors will improve Loraine Leriche N/A or DELETE if not current risk factor) Outcome: Progressing   Problem: Ischemic Stroke/TIA Tissue Perfusion: Goal: Complications of ischemic stroke/TIA will be minimized Outcome: Progressing   Problem: Coping: Goal: Will verbalize positive feelings about self Outcome: Progressing Goal: Will identify appropriate support needs Outcome: Progressing   Problem: Health Behavior/Discharge Planning: Goal: Ability to manage health-related needs will improve Outcome: Progressing Goal: Goals will be collaboratively established with patient/family Outcome: Progressing   Problem: Self-Care: Goal: Ability to participate in self-care as condition permits will improve Outcome: Progressing Goal: Verbalization of feelings and concerns over difficulty with self-care will improve Outcome: Progressing Goal: Ability to communicate needs accurately will improve Outcome: Progressing   Problem: Nutrition: Goal: Risk of aspiration will decrease Outcome: Progressing Goal: Dietary intake will improve Outcome: Progressing   Problem: Education: Goal: Knowledge of General Education information will improve Description: Including pain rating scale, medication(s)/side effects and non-pharmacologic comfort measures Outcome: Progressing   Problem: Health Behavior/Discharge Planning: Goal: Ability to manage health-related needs will improve Outcome: Progressing   Problem: Clinical Measurements: Goal: Ability to maintain clinical measurements within normal limits will improve Outcome: Progressing Goal: Will remain free from infection Outcome: Progressing Goal: Diagnostic test results will improve Outcome: Progressing Goal: Respiratory  complications will improve Outcome: Progressing Goal: Cardiovascular complication will be avoided Outcome: Progressing   Problem: Activity: Goal: Risk for activity intolerance will decrease Outcome: Progressing   Problem: Nutrition: Goal: Adequate nutrition will be maintained Outcome: Progressing   Problem: Coping: Goal: Level of anxiety will decrease Outcome: Progressing   Problem: Elimination: Goal: Will not experience complications related to bowel motility Outcome: Progressing Goal: Will not experience complications related to urinary retention Outcome: Progressing   Problem: Pain Managment: Goal: General experience of comfort will improve Outcome: Progressing   Problem: Safety: Goal: Ability to remain free from injury will improve Outcome: Progressing   Problem: Skin Integrity: Goal: Risk for impaired skin integrity will decrease Outcome: Progressing

## 2023-06-25 NOTE — Evaluation (Signed)
Speech Language Pathology Evaluation Patient Details Name: Kathy Baldwin MRN: [id number] DOB: 1945/12/02 Today's Date: 06/25/2023 Time: 1205-1300 SLP Time Calculation (min) (ACUTE ONLY): 55 min  Problem List:  Patient Active Problem List   Diagnosis Date Noted   Acute CVA (cerebrovascular accident) (HCC) 06/24/2023   Acute idiopathic gout 06/24/2023   Wrist fracture, closed, left, sequela 06/24/2023   Acute cerebrovascular accident (CVA) due to ischemia (HCC) 06/24/2023   Stroke-like symptom 06/23/2023   Fall with injury 06/02/2023   Right wrist injury, initial encounter 06/02/2023   Renal insufficiency 05/09/2023   Dysuria 02/09/2023   MDD (major depressive disorder), single episode, 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/2023   Peroneal nerve palsy, 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   Loose stools 04/18/2022   Asthma 11/09/2021   Exertional dyspnea 11/06/2021   Other fatigue 09/26/2021   History of stroke 09/11/2021   Bradycardia 04/27/2021   Urge urinary incontinence 03/06/2021   Medicare annual wellness visit, subsequent 03/05/2021   Advanced directives, counseling/discussion 03/05/2021   Seborrheic keratosis 12/13/2020   Mild dementia (HCC) 11/19/2018   Mass of leg, left 10/23/2018   History of cerebellar stroke 10/08/2018   Small vessel disease, 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   Carotid stenosis, bilateral 10/08/2018   Thyroid nodule 04/14/2018   DNR no code (do not resuscitate) 03/20/2017   Vaginal atrophy 02/18/2017   Aortic atherosclerosis (HCC) 09/05/2016   Decreased hearing, bilateral 12/04/2015   Health maintenance examination 12/04/2015   Hypertension    ASCUS with positive high risk human papillomavirus of vagina    GERD (gastroesophageal reflux disease)    OSA on CPAP    Hypertrophy of nasal turbinates    Hyperlipidemia 03/28/2015   Allergic rhinitis 03/28/2015   Osteopenia 06/09/2013   Past Medical History:  Past Medical History:  Diagnosis Date   Age related osteoporosis 04/09/2016    Allergic rhinitis    Allergy Statins, chocolate, cabbabe   Shown on record   Arthritis Though never diagnosed   Seems to be in hands   ASCUS with 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 Holly Bodily 2010   DNR no code (do not resuscitate) 03/20/2017   GERD (gastroesophageal reflux disease)    Hearing loss    History of gallstones    Hyperlipidemia    Hypertension    Hypertrophy of nasal turbinates    Leg mass, left    in calf muscle seeing surgeon tomorrow regarding   Lymphocytosis    Menopause age 51   Multiple food allergies    chocolate and cabbage   OSA on CPAP    Osteopenia Oct. 2014   Sleep apnea    Stroke (HCC) 09/11/2021   Thyroid nodule    Vertigo    Vitamin D deficiency disease    Past Surgical History:  Past Surgical History:  Procedure Laterality Date   BREAST CYST ASPIRATION Right    negative   CARPAL TUNNEL RELEASE Right 2015   CATARACT EXTRACTION W/PHACO Left 09/30/2018   Procedure: CATARACT EXTRACTION PHACO AND INTRAOCULAR LENS PLACEMENT (IOC)  LEFT;  Surgeon: Lockie Mola, MD;  Location: [facility];  Service: Ophthalmology;  Laterality: Left;  sleep apnea   CATARACT EXTRACTION W/PHACO Right 10/28/2018   Procedure: CATARACT EXTRACTION PHACO AND INTRAOCULAR LENS PLACEMENT (IOC)  RIGHT;  Surgeon: Lockie Mola, MD;  Location: [facility];  Service: Ophthalmology;  Laterality: Right;   CHOLECYSTECTOMY  1990   COLONOSCOPY  04/25/2008   CYST EXCISION Left 2020   L popliteal/peroneal cyst  excision s/p residual numbness   DILATION AND CURETTAGE OF UTERUS  1982   s/p miscarriage   ESOPHAGOGASTRODUODENOSCOPY  08/2020   chronic gastritis, neg H pylori, no Barretts, no rpt needed [facility])   HPI:  Pt is a 77 y.o. female presenting to hospital 06/23/23 with generalized weakness as well as L arm and L leg weakness.  Also c/o pain in L arm (acute; s/p fall at home) and L leg.  Imaging showing small acute R basal  ganglia infarct.  L wrist x-ray: "Thin curvilinear density in this  region may represent soft tissue calcification versus subtle  triquetral fracture".   Pt admitted with stroke-like symptom, dysuria.   PMH includes Multiple medical dxs including Mild Dementia, htn, h/o CVA January 2023, L leg mass, sleep apnea, vertigo, CTR R.    MRI: 1. Small acute right basal ganglia infarct.  2. Moderate Chronic small vessel ischemic disease.  3. Chronic cerebellar infarcts.   Assessment / Plan / Recommendation Clinical Impression   Pt seen today for informal cognitive-communication and language screening at bedside. Pt awake, verbal. Often chuckled, laughed during verbal engagement/responses as did husband present at bedside. Pt A/Ox3.  On RA, afebrile. WBC WNL. Per H&P note, pt has Mild Dementia at Baseline and would benefit from formal assessment by Outpatient Neurology evaluation post D/C for ongoing management.      Pt presents with grossly functional communication abilities in setting of known Dementia w/ general, known tasks and situations(pictured situations at bedside, common issues in hospital room). She was presented w/ the SLUM Examination at bedside scoring 18/30. Impairment characterized by deficits in the areas of executive function tasks including money, memory recall, fluency, and mental flexibility. Deficits could impact ability to independently manage finances, medications, and maintain safety in the home environment. H/o Cognitive deficits is noted per chart notes.    Pt was able to identify overt problem situations w/ immediate solutions and indicate need for "911" contact in an emergency situation. Pt was able to complete general language tasks in setting of basic ADLs but did have memory recall deficits, particularly of details. She answered basic Y/N questions and follow 1-2 step commands; no gross expressive language deficits were noted. She was oriented to self/place/time and could ID 2  likes/differences as well as object function in basic ADL objects. Primary deficits were associated with memory retrieval tasks and tasks of higher level executive functioning and flexibility. Accuracy of responses improved with verbal cueing.  Reduced distractions in room during session to improve focus and attention.   Suspect pt could be close to/at her previous communication Baseline as Cognitive decline can impact communication abilities. F/u for Cognitive intervention could be beneficial w/ cognitive ADLs in hers D/C environment, if any decline from her baseline functioning is noted by Husband/others around her. Husband stated she was "much better today".   No further Acute ST services indicated currently but f/u with ST services in the Outpatient setting as determined needed by MD/PCP was discussed w/ pt/Husband and and could be had. Pt is d/t have Home Health services for PT/OT per [facility] note; recommend monitoring of pt's interactions and f/u w/ PCP for any further referral indicated. Pt in agreement w/ plan. Team updated.      SLP Assessment  SLP Recommendation/Assessment: All further Speech Lanaguage Pathology  needs can be addressed in the next venue of care SLP Visit Diagnosis: Cognitive communication deficit (R41.841)    Recommendations for follow up therapy are one component of a multi-disciplinary  discharge planning process, led by the attending physician.  Recommendations may be updated based on patient status, additional functional criteria and insurance authorization.    Follow Up Recommendations  Follow physician's recommendations for discharge plan and follow up therapies    Assistance Recommended at Discharge  Set up Supervision/Assistance  Functional Status Assessment  (TBD)  Frequency and Duration  (n/a)   (n/a)      SLP Evaluation Cognition  Overall Cognitive Status: Difficult to assess Arousal/Alertness: Awake/alert Orientation Level: Oriented to place;Oriented to  person;Oriented to time Year: 2024 Month: October Day of Week: Correct Attention: Focused;Sustained Focused Attention: Appears intact Sustained Attention: Appears intact Memory: Impaired Memory Impairment: Decreased recall of new information Awareness: Appears intact Problem Solving: Appears intact (functional) Executive Function: Reasoning Reasoning: Appears intact Behaviors:  (n/a) Safety/Judgment: Appears intact Comments: w/ pictured situations       Comprehension  Auditory Comprehension Overall Auditory Comprehension: Appears within functional limits for tasks assessed (at bedside) Yes/No Questions: Within Functional Limits Commands: Within Functional Limits (1-2 step) Conversation: Simple Other Conversation Comments: chuckled often during responses Interfering Components:  (n/a) EffectiveTechniques: Slowed speech;Repetition (as needed) Visual Recognition/Discrimination Discrimination: Not tested Reading Comprehension Reading Status: Not tested    Expression Expression Primary Mode of Expression: Verbal Verbal Expression Overall Verbal Expression: Appears within functional limits for tasks assessed Initiation: No impairment Automatic Speech: Name;Social Response;Counting;Day of week Level of Generative/Spontaneous Verbalization: Sentence Repetition: No impairment Naming:  (for functional ADL objects(9)) Pragmatics:  (chuckled often) Interfering Components:  (n/a) Non-Verbal Means of Communication: Not applicable Written Expression Dominant Hand: Right Written Expression: Not tested   Oral / Motor  Oral Motor/Sensory Function Overall Oral Motor/Sensory Function: Within functional limits Motor Speech Overall Motor Speech: Appears within functional limits for tasks assessed (grossly) Respiration: Within functional limits Phonation: Normal Resonance: Within functional limits Articulation: Within functional limitis (when she took her time and  calmed) Intelligibility: Intelligible (100%) Motor Planning: Witnin functional limits (grossly) Motor Speech Errors: Not applicable Effective Techniques:  (take a deep breath to calm occasionally)              Jerilynn Som, MS, CCC-SLP Speech Language Pathologist Rehab Services; [facility] [phone] (ascom) Jerremy Maione 06/25/2023, 3:25 PM

## 2023-06-25 NOTE — Progress Notes (Signed)
Occupational Therapy Treatment Patient Details Name: Kathy Baldwin MRN: [id number] DOB: July 15, 1946 Today's Date: 06/25/2023   History of present illness Pt is a 77 y.o. female presenting to hospital 06/23/23 with generalized weakness as well as L arm and L leg weakness.  Also c/o pain in L arm (acute; s/p fall at home) and L leg.  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  L wrist x-ray: "Thin curvilinear density in this  region may represent soft tissue calcification versus subtle  triquetral fracture".   Pt admitted with stroke-like symptom, dysuria.  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   OT comments  Chart reviewed prior to tx session. Pt seen for OT treatment on this date. Upon arrival to room pt awake in bed with husband present. Tx session targeted improved ADL activity tolerance. Pt was alert and oriented to self only, husband states she is more "more easily confused" than usual. Bed mobility; CGA supine <> sit and back. Pt requires CGA + platform RW for all mobility. Pt able to donn/doff socks with MIN A for completion. Pt amb to bathroom with platform RW + CGA, performed peri care with supervision. Edu family on DME recommendations post d/c. Pt making good progress toward goals, will continue to follow POC. Updated discharge plan in collaboration with family, husband reports plans to dc to daughter's house with level entry, one story. OT will follow acutely.           If plan is discharge home, recommend the following:  Assistance with cooking/housework;Assist for transportation;Help with stairs or ramp for entrance;A little help with bathing/dressing/bathroom;A little help with walking and/or transfers;Direct supervision/assist for medications management;Direct supervision/assist for financial management;Supervision due to cognitive status   Equipment Recommendations  Other (comment);BSC/3in1;Tub/shower seat (RW with L platform)     Recommendations for Other Services      Precautions / Restrictions Precautions Precautions: Fall Required Braces or Orthoses: Splint/Cast Splint/Cast: L wrist splint, platform walker Restrictions Weight Bearing Restrictions: Yes LUE Weight Bearing: Non weight bearing Other Position/Activity Restrictions: NWB L wrist/hand; can use platform walker       Mobility Bed Mobility Overal bed mobility: Needs Assistance Bed Mobility: Supine to Sit, Sit to Supine     Supine to sit: CGA Sit to supine: CGA   General bed mobility comments: No physcial assistance needed    Transfers Overall transfer level: Needs assistance Equipment used: Rolling walker (2 wheels) Transfers: Sit to/from Stand Sit to Stand: Contact guard assist                 Balance Overall balance assessment: Needs assistance Sitting-balance support: No upper extremity supported, Feet supported Sitting balance-Leahy Scale: Good Sitting balance - Comments: slight posterior lean noted during ROM assessment at EOB Postural control: Posterior lean Standing balance support: Single extremity supported, Reliant on assistive device for balance, During functional activity Standing balance-Leahy Scale: Good                             ADL either performed or assessed with clinical judgement   ADL Overall ADL's : Needs assistance/impaired                     Lower Body Dressing: Minimal assistance;Supervision/safety Lower Body Dressing Details (indicate cue type and reason): Doffing socks; supervision. Donning socks; Min for second foot (EOB) Toilet Transfer: Contact guard assist;Ambulation;Cueing for safety;Regular  Toilet;Grab bars (Platform walker)   Toileting- Architect and Hygiene: Supervision/safety;Sit to/from stand       Functional mobility during ADLs: Minimal assistance;Cueing for sequencing;Rolling walker (2 wheels) approx 15' 2 attempts  General ADL Comments:  overall increased participation and completion    Extremity/Trunk Assessment Upper Extremity Assessment Upper Extremity Assessment: Generalized weakness;LUE deficits/detail LUE Deficits / Details: L wrist splint in place; AROM L elbow flexion/extension WFL; AROM L shoulder flexion to about 170 degrees            Vision       Perception     Praxis      Cognition Arousal: Alert Behavior During Therapy: WFL for tasks assessed/performed Overall Cognitive Status: Impaired/Different from baseline Area of Impairment: Attention, Following commands, Safety/judgement, Awareness                 Orientation Level: Disoriented to, Place, Time, Situation Current Attention Level: Sustained Memory: Decreased short-term memory Following Commands: Follows one step commands with increased time Safety/Judgement: Decreased awareness of deficits Awareness: Emergent Problem Solving: Slow processing, Requires verbal cues, Requires tactile cues General Comments: Increase alertness on this date        Exercises Other Exercises Other Exercises: Edu: demlirum management techniques, DME use for ADL completion/ hand placement    Shoulder Instructions       General Comments Pt still with some hesitancy/pain, but ultimately showed great improvement with mobility and activity tolerance today.    Pertinent Vitals/ Pain       Pain Assessment Pain Assessment: Faces Faces Pain Scale: No hurt  Home Living                                          Prior Functioning/Environment              Frequency  Min 1X/week        Progress Toward Goals  OT Goals(current goals can now be found in the care plan section)  Progress towards OT goals: Progressing toward goals     Plan      Co-evaluation                 AM-PAC OT "6 Clicks" Daily Activity     Outcome Measure   Help from another person eating meals?: None Help from another person taking care of  personal grooming?: A Little Help from another person toileting, which includes using toliet, bedpan, or urinal?: A Little Help from another person bathing (including washing, rinsing, drying)?: A Little Help from another person to put on and taking off regular upper body clothing?: A Little Help from another person to put on and taking off regular lower body clothing?: A Little 6 Click Score: 19    End of Session Equipment Utilized During Treatment: Rolling walker (2 wheels);Other (comment)  OT Visit Diagnosis: Other abnormalities of gait and mobility (R26.89);Unsteadiness on feet (R26.81);Repeated falls (R29.6)   Activity Tolerance Patient tolerated treatment well   Patient Left in bed;with call bell/phone within reach;with family/visitor present   Nurse Communication          Time: 8295-6213 OT Time Calculation (min): 23 min  Charges: OT General Charges $OT Visit: 1 Visit OT Treatments $Self Care/Home Management : 8-22 mins $Therapeutic Activity: 8-22 mins  [employer], OTS

## 2023-06-26 ENCOUNTER — Telehealth: Payer: Self-pay

## 2023-06-26 NOTE — Transitions of Care (Post Inpatient/ED Visit) (Signed)
06/26/2023  Name: Kathy Baldwin MRN: [id number] DOB: 10/27/1945  Today's TOC FU Call Status: Today's TOC FU Call Status:: Unsuccessful Call (1st Attempt) Unsuccessful Call (1st Attempt) Date: 06/26/23  Attempted to reach the patient regarding the most recent Inpatient/ED visit.  Follow Up Plan: Additional outreach attempts will be made to reach the patient to complete the Transitions of Care (Post Inpatient/ED visit) call.   Abby Adamariz Gillott, CMA  [facility] AWV Team Direct Dial: [phone]

## 2023-06-26 NOTE — Transitions of Care (Post Inpatient/ED Visit) (Signed)
06/26/2023  Name: Kathy Baldwin MRN: [id number] DOB: 04/10/1946  Today's TOC FU Call Status: Today's TOC FU Call Status:: Unsuccessful Call (2nd Attempt) Unsuccessful Call (1st Attempt) Date: 06/26/23 Unsuccessful Call (2nd Attempt) Date: 06/26/23  Attempted to reach the patient regarding the most recent Inpatient/ED visit.  Follow Up Plan: Additional outreach attempts will be made to reach the patient to complete the Transitions of Care (Post Inpatient/ED visit) call.   Abby Correne Lalani, CMA  [facility] AW[facility] Team Direct Dial: [phone]

## 2023-06-27 ENCOUNTER — Telehealth: Payer: Self-pay

## 2023-06-27 NOTE — Transitions of Care (Post Inpatient/ED Visit) (Signed)
06/27/2023  Name: Kathy Baldwin MRN: [id number] DOB: [birth year]  Today's TOC FU Call Status: Today's TOC FU Call Status:: Successful TOC FU Call Completed TOC FU Call Complete Date: 06/27/23 Patient's Name and Date of Birth confirmed.  Transition Care Management Follow-up Telephone Call Discharge Facility: [facility]) Type of Discharge: Inpatient Admission Primary Inpatient Discharge Diagnosis:: Stroke  like symptoms How have you been since you were released from the hospital?: Better Any questions or concerns?: No  Items Reviewed: Did you receive and understand the discharge instructions provided?: Yes Medications obtained,verified, and reconciled?: Yes (Medications Reviewed) Any new allergies since your discharge?: No Dietary orders reviewed?: Yes Type of Diet Ordered:: heart healthy Do you have support at home?: Yes People in Home: child(ren), adult Name of Support/Comfort Primary Source: daughter  Kathy Baldwin  Medications Reviewed Today: Medications Reviewed Today     Reviewed by Earlie Server, RN (Registered Nurse) on 06/27/23 at 1308  Med List Status: <None>   Medication Order Taking? Sig Documenting Provider Last Dose Status Informant  amLODipine (NORVASC) 5 MG tablet [id number] Yes Take 0.5 tablets (2.5 mg total) by mouth daily. [name], MD Taking Active Self  aspirin EC 81 MG EC tablet [id number] Yes Take 1 tablet (81 mg total) by mouth daily. Swallow whole. Alford Highland, MD Taking Active Self  buPROPion ER [facility] SR) 100 MG 12 hr tablet [id number] Yes Take 1 tablet (100 mg total) by mouth daily. [name], MD Taking Active Self  Calcium Carb-Cholecalciferol (CALCIUM 600+D) 600-800 MG-UNIT TABS [id number] No Take by mouth daily. 2 capsule daily  Patient not taking: Reported on 06/27/2023   [provider] Not Taking Active Self  clopidogrel (PLAVIX) 75 MG tablet [id number] Yes Take 1 tablet (75 mg total) by  mouth daily. Alford Highland, MD Taking Active Self  COLLAGEN PO [id number] No Take by mouth.  Patient not taking: Reported on 06/27/2023   [provider] Not Taking Active Self  Cyanocobalamin (B-12) 2500 MCG TABS [id number] Yes Take 1 tablet by mouth daily. Pt taking 1/2 of a 5000 mcg tablet [provider] Taking Active Self  donepezil (ARICEPT) 10 MG tablet [id number] Yes Take 10 mg by mouth at bedtime.  [provider] Taking Active Self  famotidine (PEPCID) 20 MG tablet [id number] Yes Take 1 tablet (20 mg total) by mouth 2 (two) times daily. For heartburn/indigestion [name], MD Taking Active Self  FIBER PO [id number] No Take by mouth.  Patient not taking: Reported on 06/27/2023   [provider] Not Taking Active Self  fluticasone (FLONASE) 50 MCG/ACT nasal spray [id number] No instill TWO SPRAYS in each nostril daily (shake gently)  Patient not taking: Reported on 06/27/2023   [name], MD Not Taking Active Self  fluticasone-salmeterol [facility] INHUB) 250-50 MCG/ACT AEPB [id number] Yes Inhale 1 puff into the lungs in the morning and at bedtime. [name], MD Taking Active Self  gabapentin (NEURONTIN) 100 MG capsule [id number] Yes Take 1 capsule (100 mg total) by mouth in the morning. [name], MD Taking Active Self  meclizine (ANTIVERT) 25 MG tablet [id number] No Take 1 tablet (25 mg total) by mouth 3 (three) times daily as needed for dizziness.  Patient not taking: Reported on 06/27/2023   [name], MD Not Taking Active Self  memantine [facility]) 10 MG tablet [id number] Yes Take 10 mg by mouth 2 (two) times daily. [provider] Taking Active Self  mirabegron ER (MYRBETRIQ) 25 MG TB24 tablet [id number] Yes Take  1 tablet (25 mg total) by mouth daily. [name], MD Taking Active Self  Multiple Vitamin (MULTIVITAMIN) tablet [id number] Yes Take 1 tablet by mouth daily. [provider] Taking Active  Self  pantoprazole (PROTONIX) 40 MG tablet [id number] Yes Take 40 mg by mouth daily. [name], MD Taking Active Self  PEDIASURE/FIBER (PEDIASURE/FIBER) LIQD [id number] No Take 10 mLs by mouth.  Patient not taking: Reported on 06/27/2023   [provider] Not Taking Active Self  polyethylene glycol (MIRALAX / GLYCOLAX) 17 g packet [id number] Yes Take 8.5-17 g by mouth daily as needed. [name], MD Taking Active Self  predniSONE (DELTASONE) 20 MG tablet [id number] Yes Take 1 tablet (20 mg total) by mouth daily with breakfast for 3 days. Pennie Banter, DO Taking Active   rosuvastatin (CRESTOR) 20 MG tablet [id number] Yes TAKE 1 TABLET BY MOUTH AT BEDTIME Pennie Banter, DO Taking Active             Home Care and Equipment/Supplies: Were Home Health Services Ordered?: Yes Name of Home Health Agency:: [facility] Has Agency set up a time to come to your home?: Yes First Home Health Visit Date: 06/27/23 Any new equipment or medical supplies ordered?: Yes Name of Medical supply agency?: [employer] Were you able to get the equipment/medical supplies?: Yes Do you have any questions related to the use of the equipment/supplies?: No  Functional Questionnaire: Do you need assistance with bathing/showering or dressing?: No Do you need assistance with meal preparation?: Yes Do you need assistance with eating?: No Do you have difficulty maintaining continence: Yes (not anything new) Do you need assistance with getting out of bed/getting out of a chair/moving?: No Do you have difficulty managing or taking your medications?: No (meds are pill packing)  Follow up appointments reviewed: PCP Follow-up appointment confirmed?: Yes Date of PCP follow-up appointment?: 07/02/23 Follow-up Provider: PCP Specialist Hospital Follow-up appointment confirmed?: No Follow-Up Specialty Provider::  (Discuss with PCP) Do you need transportation to your follow-up appointment?: No Do you  understand care options if your condition(s) worsen?: Yes-patient verbalized understanding  SDOH Interventions Today    Flowsheet Row Most Recent Value  SDOH Interventions   Food Insecurity Interventions Intervention Not Indicated  Transportation Interventions Intervention Not Indicated     Interventions:  Reviewed importance of MD follow up Reviewed medications. Offered 30 day TOC program and husband declined. Provided my contact information for future reference if needed.  Encouraged home BP monitoring twice a day, record and take to MD follow up visits. Reviewed ADLS and IADLS with daughter. PT is in the home with patient during this call.  No further needs identified by daughter or husband.   Lonia Chimera, RN, BSN, CEN [facility] [phone]

## 2023-07-02 ENCOUNTER — Encounter: Payer: Self-pay | Admitting: Family Medicine

## 2023-07-02 ENCOUNTER — Ambulatory Visit: Payer: PPO | Admitting: Family Medicine

## 2023-07-02 VITALS — BP 126/64 | HR 65 | Temp 98.6°F | Ht 62.0 in | Wt 142.4 lb

## 2023-07-02 DIAGNOSIS — J343 Hypertrophy of nasal turbinates: Secondary | ICD-10-CM

## 2023-07-02 DIAGNOSIS — M1990 Unspecified osteoarthritis, unspecified site: Secondary | ICD-10-CM

## 2023-07-02 DIAGNOSIS — I959 Hypotension, unspecified: Secondary | ICD-10-CM

## 2023-07-02 DIAGNOSIS — F320 Major depressive disorder, single episode, mild: Secondary | ICD-10-CM

## 2023-07-02 DIAGNOSIS — S6991XD Unspecified injury of right wrist, hand and finger(s), subsequent encounter: Secondary | ICD-10-CM

## 2023-07-02 DIAGNOSIS — M109 Gout, unspecified: Secondary | ICD-10-CM

## 2023-07-02 DIAGNOSIS — R946 Abnormal results of thyroid function studies: Secondary | ICD-10-CM

## 2023-07-02 DIAGNOSIS — E782 Mixed hyperlipidemia: Secondary | ICD-10-CM | POA: Diagnosis not present

## 2023-07-02 DIAGNOSIS — M810 Age-related osteoporosis without current pathological fracture: Secondary | ICD-10-CM

## 2023-07-02 DIAGNOSIS — S62102S Fracture of unspecified carpal bone, left wrist, sequela: Secondary | ICD-10-CM

## 2023-07-02 DIAGNOSIS — E785 Hyperlipidemia, unspecified: Secondary | ICD-10-CM | POA: Diagnosis not present

## 2023-07-02 DIAGNOSIS — Z8673 Personal history of transient ischemic attack (TIA), and cerebral infarction without residual deficits: Secondary | ICD-10-CM | POA: Diagnosis not present

## 2023-07-02 DIAGNOSIS — I69354 Hemiplegia and hemiparesis following cerebral infarction affecting left non-dominant side: Secondary | ICD-10-CM | POA: Diagnosis not present

## 2023-07-02 DIAGNOSIS — G4733 Obstructive sleep apnea (adult) (pediatric): Secondary | ICD-10-CM

## 2023-07-02 DIAGNOSIS — M8580 Other specified disorders of bone density and structure, unspecified site: Secondary | ICD-10-CM

## 2023-07-02 DIAGNOSIS — I10 Essential (primary) hypertension: Secondary | ICD-10-CM

## 2023-07-02 DIAGNOSIS — F03A3 Unspecified dementia, mild, with mood disturbance: Secondary | ICD-10-CM | POA: Diagnosis not present

## 2023-07-02 DIAGNOSIS — S62102D Fracture of unspecified carpal bone, left wrist, subsequent encounter for fracture with routine healing: Secondary | ICD-10-CM

## 2023-07-02 DIAGNOSIS — I700 Atherosclerosis of aorta: Secondary | ICD-10-CM

## 2023-07-02 DIAGNOSIS — I639 Cerebral infarction, unspecified: Secondary | ICD-10-CM

## 2023-07-02 LAB — COMPREHENSIVE METABOLIC PANEL
ALT: 16 U/L (ref 0–35)
AST: 21 U/L (ref 0–37)
Albumin: 3.9 g/dL (ref 3.5–5.2)
Alkaline Phosphatase: 79 U/L (ref 39–117)
BUN: 17 mg/dL (ref 6–23)
CO2: 30 meq/L (ref 19–32)
Calcium: 9.5 mg/dL (ref 8.4–10.5)
Chloride: 106 meq/L (ref 96–112)
Creatinine, Ser: 0.95 mg/dL (ref 0.40–1.20)
GFR: 57.24 mL/min — ABNORMAL LOW (ref >60.00)
Glucose, Bld: 114 mg/dL — ABNORMAL HIGH (ref 70–99)
Potassium: 3.7 meq/L (ref 3.5–5.1)
Sodium: 141 meq/L (ref 135–145)
Total Bilirubin: 0.5 mg/dL (ref 0.2–1.2)
Total Protein: 6.2 g/dL (ref 6.0–8.3)

## 2023-07-02 LAB — CK: Total CK: 73 U/L (ref 7–177)

## 2023-07-02 LAB — TSH: TSH: 0.75 u[IU]/mL (ref 0.35–5.50)

## 2023-07-02 LAB — T4, FREE: Free T4: 0.72 ng/dL (ref 0.60–1.60)

## 2023-07-02 NOTE — Patient Instructions (Addendum)
Labs today  Continue current medicines.  Good to see you today Keep upcoming appointments.  Return in 4 months for follow up visit.

## 2023-07-02 NOTE — Progress Notes (Signed)
Ph: [phone] Fax: [phone]   Patient ID: Kathy Baldwin, female    DOB: [birth year], 77 y.o.   MRN: [id number]  This visit was conducted in person.  BP 126/64   Pulse 65   Temp 98.6 F (37 C) (Oral)   Ht 5\' 2"  (1.575 m)   Wt 142 lb 6 oz (64.6 kg)   SpO2 98%   BMI 26.04 kg/m    CC: hosp f/u visit  Subjective:   HPI: JAQUESE Baldwin is a 77 y.o. female presenting on 07/02/2023 for Hospitalization Follow-up (Admitted on 06/23/23 at [facility], dx weakness; stenosis of R internal carotid artery; mixed hyperlipidemia. Pt accompanied by husband, [name]. )   Recent hospitalization for AMS with left sided weakness and L foot burning pain present upon awakening on 06/23/2023.  Hospital records reviewed. Med rec performed.  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  MRI brain showed small acute R basal ganglia infarct with mod chronic small vessel ischemic disease, as well as chronic cerebellar infarcts.  Echocardiogram 06/24/2023 showed normal EF 60-65%, normal wall motion with G1DD, normal heart valves with negative bubble study.  Per husband, she had normal holter monitor through [facility] cardiology.   Rosuvastatin dose was increased to 20mg  daily - she is tolerating this well.   Planned referral to [facility] to consider intervention of R ICA stenosis.  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  She feels R wrist continues to improve daily, using wrist brace PRN.   [facility]  Other follow up appointments scheduled: [facility] neurology Dr Sherryll Burger 10/2023, [facility] neurology Dr Jimmey Ralph 07/11/2023 for memory assessment/?dementia infusion treatment, [facility] Dr Gilda Crease 07/24/2023 ______________________________________________________________________ Hospital admission: 06/23/2023 Hospital discharge: 06/25/2023 TCM f/u phone call:  performed on  06/27/2023  Recommendations at discharge:  Follow up with Primary Care in 1-2 weeks Follow up on tolerance for increased Crestor dose of 20 mg, recommended by Neurology Follow up outpatient with Vascular surgery regarding R ICA stenosis for potential intervention Follow up outpatient with Neurology Repeat CMP, CBC, CK at follow up  Discharge Diagnoses: Principal Problem:   Stroke-like symptom Active Problems:   Hyperlipidemia   Hypertension   GERD (gastroesophageal reflux disease)   OSA on CPAP   Decreased hearing, bilateral   Aortic atherosclerosis (HCC)   DNR no code (do not resuscitate)   History of cerebellar stroke   Carotid stenosis, bilateral   Mild dementia (HCC)   Bradycardia   MDD (major depressive disorder), single episode, mild (HCC)   Dysuria   Acute CVA (cerebrovascular accident) (HCC)   Acute idiopathic gout   Wrist fracture, closed, left, sequela   Acute cerebrovascular accident (CVA) due to ischemia (HCC)     Relevant past medical, surgical, family and social history reviewed and updated as indicated. Interim medical history since our last visit reviewed. Allergies and medications reviewed and updated. Outpatient Medications Prior to Visit  Medication Sig Dispense Refill   amLODipine (NORVASC) 5 MG tablet Take 0.5 tablets (2.5 mg total) by mouth daily. 90 tablet 3   aspirin EC 81 MG EC tablet Take 1 tablet (81 mg total) by mouth daily. Swallow whole. 21 tablet 0   buPROPion ER (WELLBUTRIN SR) 100 MG 12 hr tablet Take 1 tablet (100 mg total) by mouth daily. 90 tablet 4   Calcium Carb-Cholecalciferol (CALCIUM 600+D) 600-800 MG-UNIT TABS Take by mouth daily. 2  capsule daily     clopidogrel (PLAVIX) 75 MG tablet Take 1 tablet (75 mg total) by mouth daily. 90 tablet 0   COLLAGEN PO Take by mouth.     Cyanocobalamin (B-12) 2500 MCG TABS Take 1 tablet by mouth daily. Pt taking 1/2 of a 5000 mcg tablet     donepezil (ARICEPT) 10 MG tablet Take 10 mg by mouth at  bedtime.      famotidine (PEPCID) 20 MG tablet Take 1 tablet (20 mg total) by mouth 2 (two) times daily. For heartburn/indigestion 180 tablet 4   FIBER PO Take by mouth.     fluticasone (FLONASE) 50 MCG/ACT nasal spray instill TWO SPRAYS in each nostril daily (shake gently) 48 g 3   fluticasone-salmeterol (WIXELA INHUB) 250-50 MCG/ACT AEPB Inhale 1 puff into the lungs in the morning and at bedtime. 60 each 11   gabapentin (NEURONTIN) 100 MG capsule Take 1 capsule (100 mg total) by mouth in the morning.     meclizine (ANTIVERT) 25 MG tablet Take 1 tablet (25 mg total) by mouth 3 (three) times daily as needed for dizziness. 30 tablet 0   memantine (NAMENDA) 10 MG tablet Take 10 mg by mouth 2 (two) times daily.     mirabegron ER (MYRBETRIQ) 25 MG TB24 tablet Take 1 tablet (25 mg total) by mouth daily. 90 tablet 3   Multiple Vitamin (MULTIVITAMIN) tablet Take 1 tablet by mouth daily.     pantoprazole (PROTONIX) 40 MG tablet Take 40 mg by mouth daily.     PEDIASURE/FIBER (PEDIASURE/FIBER) LIQD Take 10 mLs by mouth.     polyethylene glycol (MIRALAX / GLYCOLAX) 17 g packet Take 8.5-17 g by mouth daily as needed.     rosuvastatin (CRESTOR) 20 MG tablet TAKE 1 TABLET BY MOUTH AT BEDTIME 30 tablet 1   No facility-administered medications prior to visit.     Per HPI unless specifically indicated in ROS section below Review of Systems  Objective:  BP 126/64   Pulse 65   Temp 98.6 F (37 C) (Oral)   Ht 5\' 2"  (1.575 m)   Wt 142 lb 6 oz (64.6 kg)   SpO2 98%   BMI 26.04 kg/m   Wt Readings from Last 3 Encounters:  07/02/23 142 lb 6 oz (64.6 kg)  06/23/23 138 lb 14.2 oz (63 kg)  06/02/23 138 lb (62.6 kg)      Physical Exam Vitals and nursing note reviewed.  Constitutional:      Appearance: Normal appearance. She is not ill-appearing.  HENT:     Head: Normocephalic and atraumatic.     Mouth/Throat:     Mouth: Mucous membranes are moist.     Pharynx: Oropharynx is clear. No oropharyngeal  exudate or posterior oropharyngeal erythema.  Eyes:     Extraocular Movements: Extraocular movements intact.     Conjunctiva/sclera: Conjunctivae normal.     Pupils: Pupils are equal, round, and reactive to light.  Cardiovascular:     Rate and Rhythm: Normal rate and regular rhythm.     Pulses: Normal pulses.     Heart sounds: Normal heart sounds. No murmur heard. Pulmonary:     Effort: Pulmonary effort is normal. No respiratory distress.     Breath sounds: Normal breath sounds. No wheezing, rhonchi or rales.  Musculoskeletal:     Cervical back: Normal range of motion and neck supple.     Right lower leg: No edema.     Left lower leg: No edema.  Skin:  General: Skin is warm and dry.     Findings: No rash.  Neurological:     Mental Status: She is alert.  Psychiatric:        Mood and Affect: Mood normal.        Behavior: Behavior normal.       Results for orders placed or performed in visit on 07/02/23  Comprehensive metabolic panel  Result Value Ref Range   Sodium 141 135 - 145 mEq/L   Potassium 3.7 3.5 - 5.1 mEq/L   Chloride 106 96 - 112 mEq/L   CO2 30 19 - 32 mEq/L   Glucose, Bld 114 (H) 70 - 99 mg/dL   BUN 17 6 - 23 mg/dL   Creatinine, Ser 6.96 0.40 - 1.20 mg/dL   Total Bilirubin 0.5 0.2 - 1.2 mg/dL   Alkaline Phosphatase 79 39 - 117 U/L   AST 21 0 - 37 U/L   ALT 16 0 - 35 U/L   Total Protein 6.2 6.0 - 8.3 g/dL   Albumin 3.9 3.5 - 5.2 g/dL   GFR 29.52 (L) >84.13 mL/min   Calcium 9.5 8.4 - 10.5 mg/dL  CK  Result Value Ref Range   Total CK 73 7 - 177 U/L  TSH  Result Value Ref Range   TSH 0.75 0.35 - 5.50 uIU/mL  T4, free  Result Value Ref Range   Free T4 0.72 0.60 - 1.60 ng/dL  T3  Result Value Ref Range   T3, Total 110 76 - 181 ng/dL    Assessment & Plan:   Problem List Items Addressed This Visit     Hyperlipidemia    Tolerating higher rosuvastatin 20mg  dose. LDL in hospital 56.  Update LFTs with CPK. Too soon to update FLP.  The ASCVD Risk score  (Arnett DK, et al., 2019) failed to calculate for the following reasons:   The patient has a prior MI or stroke diagnosis       Relevant Orders   Comprehensive metabolic panel (Completed)   CK (Completed)   Hypertension    BP stable on current regimen of amlodipine 5mg  daily.       History of cerebellar stroke    Remote h/o this.       History of stroke   Right wrist injury, subsequent encounter    Wrist injury due to fall last month continues improving, imaging suspicious for subtle triquetral fracture       Acute cerebrovascular accident (CVA) due to ischemia (HCC) - Primary    Crestor dose increased, continued aspirin + plavix.  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  Neurology f/u scheduled for early 2025.       Relevant Orders   Comprehensive metabolic panel (Completed)   CK (Completed)   Podagra    First episode during hospitalization, consistent with R podagra.  This was treated with 5d prednisone burst with significant benefit.  Consider urate next labs. Reviewed low purine diet to prevent recurrent gout flare.       Wrist fracture, closed, left, sequela    New on recent hospitalization, imaging also suspicious for subtle triquetral fracture       Abnormal thyroid function test    TSH low last check suggesting subclinical hyperthyroidism - update TFTs today.       Relevant Orders   TSH (Completed)   T4, free (Completed)   T3 (Completed)     No orders of  the defined types were placed in this encounter.   Orders Placed This Encounter  Procedures   Comprehensive metabolic panel   CK   TSH   T4, free   T3    Patient Instructions  Labs today  Continue current medicines.  Good to see you today Keep upcoming appointments.  Return in 4 months for follow up visit.   Follow up plan: Return in about 4 months (around 11/02/2023) for follow up visit.  [name], MD

## 2023-07-03 LAB — T3: T3, Total: 110 ng/dL (ref 76–181)

## 2023-07-05 ENCOUNTER — Encounter: Payer: Self-pay | Admitting: Family Medicine

## 2023-07-05 DIAGNOSIS — R946 Abnormal results of thyroid function studies: Secondary | ICD-10-CM | POA: Insufficient documentation

## 2023-07-05 NOTE — Assessment & Plan Note (Signed)
New on recent hospitalization, imaging also suspicious for subtle triquetral fracture

## 2023-07-05 NOTE — Assessment & Plan Note (Deleted)
Wrist injury due to fall last month continues improving, imaging suspicious for subtle triquetral fracture

## 2023-07-05 NOTE — Assessment & Plan Note (Signed)
BP stable on current regimen of amlodipine 5mg  daily.  ?

## 2023-07-05 NOTE — Assessment & Plan Note (Addendum)
Tolerating higher rosuvastatin 20mg  dose. LDL in hospital 56.  Update LFTs with CPK. Too soon to update FLP.  The ASCVD Risk score (Arnett DK, et al., 2019) failed to calculate for the following reasons:   The patient has a prior MI or stroke diagnosis

## 2023-07-05 NOTE — Assessment & Plan Note (Signed)
First episode during hospitalization, consistent with R podagra.  This was treated with 5d prednisone burst with significant benefit.  Consider urate next labs. Reviewed low purine diet to prevent recurrent gout flare.

## 2023-07-05 NOTE — Assessment & Plan Note (Signed)
TSH low last check suggesting subclinical hyperthyroidism - update TFTs today.

## 2023-07-05 NOTE — Assessment & Plan Note (Signed)
Remote h/o this.

## 2023-07-05 NOTE — Assessment & Plan Note (Signed)
Wrist injury due to fall last month continues improving, imaging suspicious for subtle triquetral fracture

## 2023-07-05 NOTE — Assessment & Plan Note (Addendum)
Crestor dose increased, continued aspirin + plavix.  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  Neurology f/u scheduled for early 2025.

## 2023-07-24 ENCOUNTER — Encounter (INDEPENDENT_AMBULATORY_CARE_PROVIDER_SITE_OTHER): Payer: Self-pay | Admitting: Vascular Surgery

## 2023-07-24 ENCOUNTER — Ambulatory Visit (INDEPENDENT_AMBULATORY_CARE_PROVIDER_SITE_OTHER): Payer: PPO | Admitting: Vascular Surgery

## 2023-07-24 VITALS — BP 159/78 | HR 55 | Resp 16 | Wt 145.2 lb

## 2023-07-24 DIAGNOSIS — I6523 Occlusion and stenosis of bilateral carotid arteries: Secondary | ICD-10-CM | POA: Diagnosis not present

## 2023-07-24 DIAGNOSIS — I679 Cerebrovascular disease, unspecified: Secondary | ICD-10-CM

## 2023-07-24 DIAGNOSIS — I639 Cerebral infarction, unspecified: Secondary | ICD-10-CM

## 2023-07-24 DIAGNOSIS — E782 Mixed hyperlipidemia: Secondary | ICD-10-CM

## 2023-07-24 DIAGNOSIS — I10 Essential (primary) hypertension: Secondary | ICD-10-CM

## 2023-07-24 NOTE — Progress Notes (Signed)
MRN : [id number]  Kathy Baldwin is a 77 y.o. (06-28-1946) female who presents with chief complaint of check carotid arteries.  History of Present Illness:   The patient is seen for follow up evaluation of carotid stenosis. She presented to [facility] in October 2024  for generalized weakness as well as left arm and left leg weakness. According to the patient she states she was driving yesterday when she became confused, was able to get home but states she had "the shakes" all last night. This morning patient woke to feeling very weak with specific weakness in the left arm and left leg, unable to ambulate so she came to the emergency department via EMS. Patient states left leg pain is fairly chronic for her but the left arm pain is new.    The patient denies interval amaurosis fugax. There is no recent history of TIA symptoms or focal motor deficits since DC from [facility]. There is a documented CVA.  The patient is taking enteric-coated aspirin 81 mg daily and Plavix 75 mg daily.  There is no history of migraine headaches. There is no history of seizures.  No recent shortening of the patient's walking distance or new symptoms consistent with claudication.  No history of rest pain symptoms. No new ulcers or wounds of the lower extremities have occurred.  There is no history of DVT, PE or superficial thrombophlebitis. No documented recent episodes of angina or shortness of breath documented.   Current Meds  Medication Sig   amLODipine (NORVASC) 5 MG tablet Take 0.5 tablets (2.5 mg total) by mouth daily.   aspirin EC 81 MG EC tablet Take 1 tablet (81 mg total) by mouth daily. Swallow whole.   buPROPion ER (WELLBUTRIN SR) 100 MG 12 hr tablet Take 1 tablet (100 mg total) by mouth daily.   Calcium Carb-Cholecalciferol (CALCIUM 600+D) 600-800 MG-UNIT TABS Take by mouth daily. 2 capsule daily   clopidogrel (PLAVIX) 75 MG tablet Take 1 tablet (75 mg total) by mouth daily.    COLLAGEN PO Take by mouth.   Cyanocobalamin (B-12) 2500 MCG TABS Take 1 tablet by mouth daily. Pt taking 1/2 of a 5000 mcg tablet   donepezil (ARICEPT) 10 MG tablet Take 10 mg by mouth at bedtime.    famotidine (PEPCID) 20 MG tablet Take 1 tablet (20 mg total) by mouth 2 (two) times daily. For heartburn/indigestion   FIBER PO Take by mouth.   fluticasone (FLONASE) 50 MCG/ACT nasal spray instill TWO SPRAYS in each nostril daily (shake gently)   fluticasone-salmeterol (WIXELA INHUB) 250-50 MCG/ACT AEPB Inhale 1 puff into the lungs in the morning and at bedtime.   gabapentin (NEURONTIN) 100 MG capsule Take 1 capsule (100 mg total) by mouth in the morning.   meclizine (ANTIVERT) 25 MG tablet Take 1 tablet (25 mg total) by mouth 3 (three) times daily as needed for dizziness.   memantine (NAMENDA) 10 MG tablet Take 10 mg by mouth 2 (two) times daily.   mirabegron ER (MYRBETRIQ) 25 MG TB24 tablet Take 1 tablet (25 mg total) by mouth daily.   Multiple Vitamin (MULTIVITAMIN) tablet Take 1 tablet by mouth daily.   pantoprazole (PROTONIX) 40 MG tablet Take 40 mg by mouth daily.   PEDIASURE/FIBER (PEDIASURE/FIBER) LIQD Take 10 mLs by mouth.   polyethylene glycol (MIRALAX / GLYCOLAX) 17 g packet Take 8.5-17 g by  mouth daily as needed.   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 Holly Bodily 2010   DNR no code (do not resuscitate) 03/20/2017   GERD (gastroesophageal reflux disease)    Hearing loss    History of gallstones    Hyperlipidemia    Hypertension    Hypertrophy of nasal turbinates    Leg mass, left    in calf muscle seeing surgeon tomorrow regarding   Lymphocytosis     Menopause age 65   Multiple food allergies    chocolate and cabbage   OSA on CPAP    Osteopenia Oct. 2014   Sleep apnea    Stroke (HCC) 09/11/2021   Thyroid nodule    Vertigo    Vitamin D deficiency disease     Past Surgical History:  Procedure Laterality Date   BREAST CYST ASPIRATION Right    negative   CARPAL TUNNEL RELEASE Right 2015   CATARACT EXTRACTION W/PHACO Left 09/30/2018   Procedure: CATARACT EXTRACTION PHACO AND INTRAOCULAR LENS PLACEMENT (IOC)  LEFT;  Surgeon: Lockie Mola, MD;  Location: [facility];  Service: Ophthalmology;  Laterality: Left;  sleep apnea   CATARACT EXTRACTION W/PHACO Right 10/28/2018   Procedure: CATARACT EXTRACTION PHACO AND INTRAOCULAR LENS PLACEMENT (IOC)  RIGHT;  Surgeon: Lockie Mola, MD;  Location: [facility];  Service: Ophthalmology;  Laterality: Right;   CHOLECYSTECTOMY  1990   COLONOSCOPY  04/25/2008   CYST EXCISION Left 2020   L popliteal/peroneal cyst excision s/p residual numbness   DILATION AND CURETTAGE OF UTERUS  1982   s/p miscarriage   ESOPHAGOGASTRODUODENOSCOPY  08/2020   chronic gastritis, neg H pylori, no Barretts, no rpt needed [facility])    Social History Social History   Tobacco Use   Smoking status: Never   Smokeless tobacco: Never   Tobacco comments:    Never smoked, so cannot quit...  Vaping Use   Vaping status: Never Used  Substance Use Topics   Alcohol use: Not Currently    Alcohol/week: 0.0 standard drinks of alcohol    Comment: Occasional celebratory drink (holidays, etc.) only.   Drug use: No    Family History Family History  Problem Relation Age of Onset   Healthy Mother    Arthritis Mother    Lung disease Father        black lung   Heart disease Father    Hyperlipidemia Father    Hypertension Father    Emphysema Sister    Healthy Sister    Cancer Brother        unsure of type   Asthma Daughter    Heart disease Sister    Thyroid disease Sister    Stroke  Maternal Grandmother    Hypertension Maternal Grandmother    Diabetes Maternal Grandmother    Diabetes Sister    Healthy Sister    Breast cancer Neg Hx     Allergies  Allergen Reactions   Cabbage Other (See Comments)    Hypersensitivity on allergy testing   Chocolate Other (See Comments)    Hypersensitivity on allergy testing   Statins Other (See Comments)  Zocor and Pravastatin--leg cramps Other reaction(s): Other (See Comments) Zocor and Pravastatin--leg cramps     REVIEW OF SYSTEMS (Negative unless checked)  Constitutional: [] Weight loss  [] Fever  [] Chills Cardiac: [] Chest pain   [] Chest pressure   [] Palpitations   [] Shortness of breath when laying flat   [] Shortness of breath with exertion. Vascular:  [x] Pain in legs with walking   [] Pain in legs at rest  [] History of DVT   [] Phlebitis   [] Swelling in legs   [] Varicose veins   [] Non-healing ulcers Pulmonary:   [] Uses home oxygen   [] Productive cough   [] Hemoptysis   [] Wheeze  [] COPD   [] Asthma Neurologic:  [] Dizziness   [] Seizures   [] History of stroke   [] History of TIA  [] Aphasia   [] Vissual changes   [] Weakness or numbness in arm   [] Weakness or numbness in leg Musculoskeletal:   [] Joint swelling   [] Joint pain   [] Low back pain Hematologic:  [] Easy bruising  [] Easy bleeding   [] Hypercoagulable state   [] Anemic Gastrointestinal:  [] Diarrhea   [] Vomiting  [] Gastroesophageal reflux/heartburn   [] Difficulty swallowing. Genitourinary:  [] Chronic kidney disease   [] Difficult urination  [] Frequent urination   [] Blood in urine Skin:  [] Rashes   [] Ulcers  Psychological:  [] History of anxiety   []  History of major depression.  Physical Examination  Vitals:   07/24/23 0949  BP: (!) 159/78  Pulse: (!) 55  Resp: 16  Weight: 145 lb 3.2 oz (65.9 kg)   Body mass index is 26.56 kg/m. Gen: WD/WN, NAD Head: Louise/AT, No temporalis wasting.  Ear/Nose/Throat: Hearing grossly intact, nares w/o erythema or drainage Eyes: PER, EOMI,  sclera nonicteric.  Neck: Supple, no masses.  No bruit or JVD.  Pulmonary:  Good air movement, no audible wheezing, no use of accessory muscles.  Cardiac: RRR, normal S1, S2, no Murmurs. Vascular:  No carotid bruit noted Vessel Right Left  Radial Palpable Palpable  Carotid  Palpable  Palpable  Subclav  Palpable Palpable  Gastrointestinal: soft, non-distended. No guarding/no peritoneal signs.  Musculoskeletal: M/S 5/5 throughout.  No visible deformity.  Neurologic: CN 2-12 intact. Pain and light touch intact in extremities.  Symmetrical.  Speech is fluent. Motor exam as listed above. Psychiatric: Judgment intact, Mood & affect appropriate for pt's clinical situation. Dermatologic: No rashes or ulcers noted.  No changes consistent with cellulitis.   CBC Lab Results  Component Value Date   WBC 8.7 06/25/2023   HGB 12.3 06/25/2023   HCT 37.5 06/25/2023   MCV 91.7 06/25/2023   PLT 228 06/25/2023    BMET    Component Value Date/Time   NA 141 07/02/2023 1219   NA 143 12/04/2015 0851   K 3.7 07/02/2023 1219   CL 106 07/02/2023 1219   CO2 30 07/02/2023 1219   GLUCOSE 114 (H) 07/02/2023 1219   BUN 17 07/02/2023 1219   BUN 11 12/04/2015 0851   CREATININE 0.95 07/02/2023 1219   CREATININE 1.10 (H) 05/02/2023 1458   CALCIUM 9.5 07/02/2023 1219   GFRNONAA >60 06/25/2023 0452   GFRNONAA 75 10/08/2018 1156   GFRAA 87 10/08/2018 1156   CrCl cannot be calculated (Patient's most recent lab result is older than the maximum 21 days allowed.).  COAG Lab Results  Component Value Date   INR 1.1 09/11/2021    Radiology No results found.   Assessment/Plan 1. Acute cerebrovascular accident (CVA) due to ischemia [facility]) The patient has a distal internal carotid artery stenosis within the siphon portion.  This appears  to have progressed and is the likely source for the stroke.  I will ask Dr. Lesle Reek to evaluate for possible intracranial intervention versus medical therapy.  I will  place the referral to [facility] neurosurgery. - Ambulatory referral to Neurosurgery - VAS US CAROTID; Future  2. Small vessel disease, cerebrovascular See #1 - Ambulatory referral to Neurosurgery  3. Carotid stenosis, bilateral Recommend:  Given the patient's asymptomatic subcritical stenosis no further invasive testing or surgery at this time.  Duplex ultrasound shows <50% stenosis bilaterally.  Continue antiplatelet therapy as prescribed Continue management of CAD, HTN and Hyperlipidemia Healthy heart diet,  encouraged exercise at least 4 times per week  Follow up in 6 months with duplex ultrasound and physical exam  - VAS US CAROTID; Future  4. Primary hypertension Continue antihypertensive medications as already ordered, these medications have been reviewed and there are no changes at this time.  5. Mixed hyperlipidemia Continue statin as ordered and reviewed, no changes at this time    Levora Dredge, MD  07/24/2023 10:00 AM

## 2023-08-26 ENCOUNTER — Inpatient Hospital Stay (HOSPITAL_COMMUNITY): Payer: PPO

## 2023-08-26 ENCOUNTER — Other Ambulatory Visit: Payer: Self-pay | Admitting: Family Medicine

## 2023-08-26 ENCOUNTER — Emergency Department (HOSPITAL_COMMUNITY): Payer: PPO

## 2023-08-26 ENCOUNTER — Other Ambulatory Visit: Payer: Self-pay

## 2023-08-26 ENCOUNTER — Inpatient Hospital Stay (HOSPITAL_COMMUNITY)
Admission: EM | Admit: 2023-08-26 | Discharge: 2023-08-27 | DRG: 062 | Disposition: A | Discharge status: Home / Self Care | Payer: PPO | Attending: Neurology | Admitting: Neurology

## 2023-08-26 DIAGNOSIS — Z888 Allergy status to other drugs, medicaments and biological substances status: Secondary | ICD-10-CM | POA: Diagnosis not present

## 2023-08-26 DIAGNOSIS — R29705 NIHSS score 5: Secondary | ICD-10-CM | POA: Diagnosis present

## 2023-08-26 DIAGNOSIS — Z9049 Acquired absence of other specified parts of digestive tract: Secondary | ICD-10-CM

## 2023-08-26 DIAGNOSIS — R29898 Other symptoms and signs involving the musculoskeletal system: Secondary | ICD-10-CM

## 2023-08-26 DIAGNOSIS — R001 Bradycardia, unspecified: Secondary | ICD-10-CM | POA: Diagnosis not present

## 2023-08-26 DIAGNOSIS — G4733 Obstructive sleep apnea (adult) (pediatric): Secondary | ICD-10-CM | POA: Diagnosis present

## 2023-08-26 DIAGNOSIS — Z7902 Long term (current) use of antithrombotics/antiplatelets: Secondary | ICD-10-CM | POA: Diagnosis not present

## 2023-08-26 DIAGNOSIS — I6389 Other cerebral infarction: Principal | ICD-10-CM | POA: Diagnosis present

## 2023-08-26 DIAGNOSIS — R4701 Aphasia: Secondary | ICD-10-CM | POA: Diagnosis present

## 2023-08-26 DIAGNOSIS — J309 Allergic rhinitis, unspecified: Secondary | ICD-10-CM | POA: Diagnosis present

## 2023-08-26 DIAGNOSIS — W1830XA Fall on same level, unspecified, initial encounter: Secondary | ICD-10-CM | POA: Diagnosis present

## 2023-08-26 DIAGNOSIS — R55 Syncope and collapse: Secondary | ICD-10-CM | POA: Diagnosis not present

## 2023-08-26 DIAGNOSIS — R29701 NIHSS score 1: Secondary | ICD-10-CM | POA: Diagnosis not present

## 2023-08-26 DIAGNOSIS — G8191 Hemiplegia, unspecified affecting right dominant side: Secondary | ICD-10-CM | POA: Diagnosis present

## 2023-08-26 DIAGNOSIS — I10 Essential (primary) hypertension: Secondary | ICD-10-CM | POA: Diagnosis present

## 2023-08-26 DIAGNOSIS — E785 Hyperlipidemia, unspecified: Secondary | ICD-10-CM | POA: Diagnosis present

## 2023-08-26 DIAGNOSIS — K219 Gastro-esophageal reflux disease without esophagitis: Secondary | ICD-10-CM | POA: Diagnosis present

## 2023-08-26 DIAGNOSIS — Z8249 Family history of ischemic heart disease and other diseases of the circulatory system: Secondary | ICD-10-CM | POA: Diagnosis not present

## 2023-08-26 DIAGNOSIS — Y92018 Other place in single-family (private) house as the place of occurrence of the external cause: Secondary | ICD-10-CM

## 2023-08-26 DIAGNOSIS — F03A0 Unspecified dementia, mild, without behavioral disturbance, psychotic disturbance, mood disturbance, and anxiety: Secondary | ICD-10-CM | POA: Diagnosis present

## 2023-08-26 DIAGNOSIS — Z7982 Long term (current) use of aspirin: Secondary | ICD-10-CM

## 2023-08-26 DIAGNOSIS — Z79899 Other long term (current) drug therapy: Secondary | ICD-10-CM

## 2023-08-26 DIAGNOSIS — E782 Mixed hyperlipidemia: Secondary | ICD-10-CM

## 2023-08-26 DIAGNOSIS — I639 Cerebral infarction, unspecified: Secondary | ICD-10-CM | POA: Diagnosis present

## 2023-08-26 DIAGNOSIS — Z823 Family history of stroke: Secondary | ICD-10-CM

## 2023-08-26 DIAGNOSIS — Z961 Presence of intraocular lens: Secondary | ICD-10-CM | POA: Diagnosis present

## 2023-08-26 DIAGNOSIS — Z91018 Allergy to other foods: Secondary | ICD-10-CM | POA: Diagnosis not present

## 2023-08-26 DIAGNOSIS — Z7951 Long term (current) use of inhaled steroids: Secondary | ICD-10-CM | POA: Diagnosis not present

## 2023-08-26 DIAGNOSIS — R471 Dysarthria and anarthria: Secondary | ICD-10-CM | POA: Diagnosis present

## 2023-08-26 LAB — DIFFERENTIAL
Abs Immature Granulocytes: 0.01 10*3/uL (ref 0.00–0.07)
Basophils Absolute: 0 10*3/uL (ref 0.0–0.1)
Basophils Relative: 1 %
Eosinophils Absolute: 0.3 10*3/uL (ref 0.0–0.5)
Eosinophils Relative: 5 %
Immature Granulocytes: 0 %
Lymphocytes Relative: 45 %
Lymphs Abs: 2.9 10*3/uL (ref 0.7–4.0)
Monocytes Absolute: 0.6 10*3/uL (ref 0.1–1.0)
Monocytes Relative: 9 %
Neutro Abs: 2.6 10*3/uL (ref 1.7–7.7)
Neutrophils Relative %: 40 %

## 2023-08-26 LAB — TROPONIN I (HIGH SENSITIVITY)
Troponin I (High Sensitivity): 8 ng/L (ref <18)
Troponin I (High Sensitivity): 10 ng/L (ref <18)

## 2023-08-26 LAB — PROTIME-INR
INR: 1 (ref 0.8–1.2)
INR: 1.1 (ref 0.8–1.2)
Prothrombin Time: 13.8 s (ref 11.4–15.2)
Prothrombin Time: 13.9 s (ref 11.4–15.2)

## 2023-08-26 LAB — CBC
HCT: 40.2 % (ref 36.0–46.0)
Hemoglobin: 12.7 g/dL (ref 12.0–15.0)
MCH: 29.9 pg (ref 26.0–34.0)
MCHC: 31.6 g/dL (ref 30.0–36.0)
MCV: 94.6 fL (ref 80.0–100.0)
Platelets: 186 10*3/uL (ref 150–400)
RBC: 4.25 MIL/uL (ref 3.87–5.11)
RDW: 13.8 % (ref 11.5–15.5)
WBC: 6.5 10*3/uL (ref 4.0–10.5)
nRBC: 0 % (ref 0.0–0.2)

## 2023-08-26 LAB — CBC WITH DIFFERENTIAL/PLATELET
Abs Immature Granulocytes: 0.04 10*3/uL (ref 0.00–0.07)
Basophils Absolute: 0.1 10*3/uL (ref 0.0–0.1)
Basophils Relative: 1 %
Eosinophils Absolute: 0.1 10*3/uL (ref 0.0–0.5)
Eosinophils Relative: 1 %
HCT: 38.5 % (ref 36.0–46.0)
Hemoglobin: 12.4 g/dL (ref 12.0–15.0)
Immature Granulocytes: 1 %
Lymphocytes Relative: 28 %
Lymphs Abs: 1.9 10*3/uL (ref 0.7–4.0)
MCH: 29.9 pg (ref 26.0–34.0)
MCHC: 32.2 g/dL (ref 30.0–36.0)
MCV: 92.8 fL (ref 80.0–100.0)
Monocytes Absolute: 0.5 10*3/uL (ref 0.1–1.0)
Monocytes Relative: 7 %
Neutro Abs: 4.2 10*3/uL (ref 1.7–7.7)
Neutrophils Relative %: 62 %
Platelets: 235 10*3/uL (ref 150–400)
RBC: 4.15 MIL/uL (ref 3.87–5.11)
RDW: 13.9 % (ref 11.5–15.5)
WBC: 6.7 10*3/uL (ref 4.0–10.5)
nRBC: 0 % (ref 0.0–0.2)

## 2023-08-26 LAB — URINALYSIS, ROUTINE W REFLEX MICROSCOPIC
Bacteria, UA: NONE SEEN
Bilirubin Urine: NEGATIVE
Glucose, UA: NEGATIVE mg/dL
Hgb urine dipstick: NEGATIVE
Ketones, ur: NEGATIVE mg/dL
Nitrite: NEGATIVE
Protein, ur: NEGATIVE mg/dL
Specific Gravity, Urine: 1.015 (ref 1.005–1.030)
pH: 8 (ref 5.0–8.0)

## 2023-08-26 LAB — COMPREHENSIVE METABOLIC PANEL
ALT: 20 U/L (ref 0–44)
AST: 36 U/L (ref 15–41)
Albumin: 3.5 g/dL (ref 3.5–5.0)
Alkaline Phosphatase: 73 U/L (ref 38–126)
Anion gap: 11 (ref 5–15)
BUN: 17 mg/dL (ref 8–23)
CO2: 22 mmol/L (ref 22–32)
Calcium: 9.5 mg/dL (ref 8.9–10.3)
Chloride: 108 mmol/L (ref 98–111)
Creatinine, Ser: 0.68 mg/dL (ref 0.44–1.00)
GFR, Estimated: >60 mL/min (ref >60)
Glucose, Bld: 95 mg/dL (ref 70–99)
Potassium: 3.4 mmol/L — ABNORMAL LOW (ref 3.5–5.1)
Sodium: 141 mmol/L (ref 135–145)
Total Bilirubin: 1.1 mg/dL (ref <1.2)
Total Protein: 6.1 g/dL — ABNORMAL LOW (ref 6.5–8.1)

## 2023-08-26 LAB — RAPID URINE DRUG SCREEN, HOSP PERFORMED
Amphetamines: NOT DETECTED
Barbiturates: NOT DETECTED
Benzodiazepines: NOT DETECTED
Cocaine: NOT DETECTED
Opiates: NOT DETECTED
Tetrahydrocannabinol: NOT DETECTED

## 2023-08-26 LAB — APTT: aPTT: 26 s (ref 24–36)

## 2023-08-26 LAB — ETHANOL: Alcohol, Ethyl (B): <10 mg/dL (ref <10)

## 2023-08-26 LAB — CG4 I-STAT (LACTIC ACID): Lactic Acid, Venous: 0.8 mmol/L (ref 0.5–1.9)

## 2023-08-26 LAB — CBG MONITORING, ED: Glucose-Capillary: 94 mg/dL (ref 70–99)

## 2023-08-26 LAB — MRSA NEXT GEN BY PCR, NASAL: MRSA by PCR Next Gen: NOT DETECTED

## 2023-08-26 MED ORDER — HYDRALAZINE HCL 20 MG/ML IJ SOLN
10.0000 mg | Freq: Once | INTRAMUSCULAR | Status: AC
  Administered 2023-08-26: 10 mg via INTRAVENOUS

## 2023-08-26 MED ORDER — ACETAMINOPHEN 325 MG PO TABS: 650.0000 mg | ORAL_TABLET | ORAL | Status: DC | PRN

## 2023-08-26 MED ORDER — ORAL CARE MOUTH RINSE: 15.0000 mL | OROMUCOSAL | Status: DC | PRN

## 2023-08-26 MED ORDER — SENNOSIDES-DOCUSATE SODIUM 8.6-50 MG PO TABS: 1.0000 | ORAL_TABLET | Freq: Every evening | ORAL | Status: DC | PRN

## 2023-08-26 MED ORDER — CHLORHEXIDINE GLUCONATE CLOTH 2 % EX PADS
6.0000 | MEDICATED_PAD | Freq: Every day | CUTANEOUS | Status: DC
  Administered 2023-08-26 – 2023-08-27 (×2): 6 via TOPICAL

## 2023-08-26 MED ORDER — SODIUM CHLORIDE 0.9 % IV BOLUS
500.0000 mL | Freq: Once | INTRAVENOUS | Status: AC
  Administered 2023-08-26: 500 mL via INTRAVENOUS

## 2023-08-26 MED ORDER — IOHEXOL 350 MG/ML SOLN
75.0000 mL | Freq: Once | INTRAVENOUS | Status: AC | PRN
  Administered 2023-08-26: 75 mL via INTRAVENOUS

## 2023-08-26 MED ORDER — ROSUVASTATIN CALCIUM 20 MG PO TABS
20.0000 mg | ORAL_TABLET | Freq: Every day | ORAL | Status: DC
Start: 2023-08-26 — End: 2023-08-27
  Administered 2023-08-26 – 2023-08-27 (×2): 20 mg via ORAL
  Filled 2023-08-26 (×2): qty 1

## 2023-08-26 MED ORDER — LABETALOL HCL 5 MG/ML IV SOLN
10.0000 mg | Freq: Once | INTRAVENOUS | Status: AC
  Administered 2023-08-26: 10 mg via INTRAVENOUS

## 2023-08-26 MED ORDER — PANTOPRAZOLE SODIUM 40 MG IV SOLR: 40.0000 mg | Freq: Every day | INTRAVENOUS | Status: DC

## 2023-08-26 MED ORDER — ACETAMINOPHEN 160 MG/5ML PO SOLN: 650.0000 mg | ORAL | Status: DC | PRN

## 2023-08-26 MED ORDER — TENECTEPLASE FOR STROKE
0.2500 mg/kg | PACK | Freq: Once | INTRAVENOUS | Status: AC
Start: 2023-08-26 — End: 2023-08-26
  Administered 2023-08-26: 17 mg via INTRAVENOUS
  Filled 2023-08-26: qty 10

## 2023-08-26 MED ORDER — CLEVIDIPINE BUTYRATE 0.5 MG/ML IV EMUL
0.0000 mg/h | INTRAVENOUS | Status: DC
  Administered 2023-08-26: 2 mg/h via INTRAVENOUS
  Administered 2023-08-26: 4 mg/h via INTRAVENOUS

## 2023-08-26 MED ORDER — STROKE: EARLY STAGES OF RECOVERY BOOK
Freq: Once | Status: AC
Start: 2023-08-27 — End: 2023-08-27
  Filled 2023-08-26: qty 1

## 2023-08-26 MED ORDER — MEMANTINE HCL 10 MG PO TABS
10.0000 mg | ORAL_TABLET | Freq: Two times a day (BID) | ORAL | Status: DC
Start: 2023-08-26 — End: 2023-08-27
  Administered 2023-08-26 – 2023-08-27 (×2): 10 mg via ORAL
  Filled 2023-08-26 (×3): qty 1

## 2023-08-26 MED ORDER — DONEPEZIL HCL 10 MG PO TABS
10.0000 mg | ORAL_TABLET | Freq: Every day | ORAL | Status: DC
Start: 2023-08-26 — End: 2023-08-27
  Administered 2023-08-26: 10 mg via ORAL
  Filled 2023-08-26: qty 1

## 2023-08-26 MED ORDER — PANTOPRAZOLE SODIUM 40 MG PO TBEC
40.0000 mg | DELAYED_RELEASE_TABLET | Freq: Every day | ORAL | Status: DC
  Administered 2023-08-26 – 2023-08-27 (×2): 40 mg via ORAL
  Filled 2023-08-26 (×2): qty 1

## 2023-08-26 MED ORDER — NOREPINEPHRINE 4 MG/250ML-% IV SOLN
INTRAVENOUS | Status: AC
  Filled 2023-08-26: qty 250

## 2023-08-26 MED ORDER — ACETAMINOPHEN 650 MG RE SUPP: 650.0000 mg | RECTAL | Status: DC | PRN

## 2023-08-26 MED ORDER — NOREPINEPHRINE 4 MG/250ML-% IV SOLN
0.0000 ug/min | INTRAVENOUS | Status: DC
  Administered 2023-08-26: 3 ug/min via INTRAVENOUS

## 2023-08-26 NOTE — Progress Notes (Signed)
OT Cancellation Note  Patient Details Name: SNIYAH KRZYWICKI MRN: [id number] DOB: [birth year]   Cancelled Treatment:     OT order received and appreciated however this conflicts with current bedrest order set. Please increase activity tolerance as appropriate and remove bedrest from orders. . Please contact OT at [phone] if bed rest order is discontinued. OT will hold evaluation at this time and will check back as time allows pending increased activity orders.   Mateo Flow 08/26/2023, 2:35 PM

## 2023-08-26 NOTE — Progress Notes (Signed)
PT Cancellation Note  Patient Details Name: Kathy Baldwin MRN: [id number] DOB: 04-08-1946   Cancelled Treatment:    Reason Eval/Treat Not Completed: Active bedrest order;Patient not medically ready. Pt on bedrest for new stroke and TNK. Will continue to monitor for readiness for PT eval.   Angelina Ok [facility] 08/26/2023, 9:44 AM Skip Mayer PT Acute Rehabilitation Services Office [phone]

## 2023-08-26 NOTE — Progress Notes (Signed)
NAME:  Kathy Baldwin, MRN:  [id number], DOB:  [birth year], LOS: 0 ADMISSION DATE:  08/26/2023, CONSULTATION DATE:  08/26/2023 REFERRING MD:  Pearlean Brownie - Stroke, CHIEF COMPLAINT:  bradycardia.   History of Present Illness:  77 year old woman who presented with aphasia and right sided weakness. She had fallen in the hallway on the way to the bathroom.  Husband reported that she was speaking gibberish. On arrival aphasia had improved but right leg weakness persisted.  Given TNK for stroke symptoms but no LVO on CTA.   This morning became lightheaded and when she was sat up to eat. Denies chest pain, SOB or nausea. Denies abdominal pain. BP dropped to 30s and SBP dropped to 70's.   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 Holly Bodily 2010   DNR no code (do not resuscitate) 03/20/2017   GERD (gastroesophageal reflux disease)    Hearing loss    History of gallstones    Hyperlipidemia    Hypertension    Hypertrophy of nasal turbinates    Leg mass, left    in calf muscle seeing surgeon tomorrow regarding   Lymphocytosis    Menopause age 59   Multiple food allergies    chocolate and cabbage   OSA on CPAP    Osteopenia Oct. 2014   Sleep apnea    Stroke (HCC) 09/11/2021   Thyroid nodule    Vertigo    Vitamin D deficiency disease    Past Surgical History:  Procedure Laterality Date   BREAST CYST ASPIRATION Right    negative   CARPAL TUNNEL RELEASE Right 2015   CATARACT EXTRACTION W/PHACO Left 09/30/2018   Procedure: CATARACT EXTRACTION PHACO AND INTRAOCULAR LENS PLACEMENT (IOC)  LEFT;  Surgeon: Lockie Mola, MD;  Location: [facility];  Service: Ophthalmology;  Laterality: Left;  sleep apnea    CATARACT EXTRACTION W/PHACO Right 10/28/2018   Procedure: CATARACT EXTRACTION PHACO AND INTRAOCULAR LENS PLACEMENT (IOC)  RIGHT;  Surgeon: Lockie Mola, MD;  Location: [facility];  Service: Ophthalmology;  Laterality: Right;   CHOLECYSTECTOMY  1990   COLONOSCOPY  04/25/2008   CYST EXCISION Left 2020   L popliteal/peroneal cyst excision s/p residual numbness   DILATION AND CURETTAGE OF UTERUS  1982   s/p miscarriage   ESOPHAGOGASTRODUODENOSCOPY  08/2020   chronic gastritis, neg H pylori, no Barretts, no rpt needed [facility])   No current facility-administered medications on file prior to encounter.   Current Outpatient Medications on File Prior to Encounter  Medication Sig Dispense Refill   amLODipine (NORVASC) 5 MG tablet Take 0.5 tablets (2.5 mg total) by mouth daily. 90 tablet 3   aspirin EC 81 MG EC tablet Take 1 tablet (81 mg total) by mouth daily. Swallow whole. 21 tablet 0   buPROPion ER (WELLBUTRIN SR) 100 MG 12 hr tablet Take 1 tablet (100 mg total) by mouth daily. 90 tablet 4   Calcium Carb-Cholecalciferol (CALCIUM 600+D) 600-800 MG-UNIT TABS Take by mouth daily. 2 capsule daily     clopidogrel (PLAVIX) 75 MG tablet Take 1 tablet (75 mg total) by mouth daily. 90 tablet 0   COLLAGEN PO Take by mouth.  Cyanocobalamin (B-12) 2500 MCG TABS Take 1 tablet by mouth daily. Pt taking 1/2 of a 5000 mcg tablet     donepezil (ARICEPT) 10 MG tablet Take 10 mg by mouth at bedtime.      famotidine (PEPCID) 20 MG tablet Take 1 tablet (20 mg total) by mouth 2 (two) times daily. For heartburn/indigestion 180 tablet 4   FIBER PO Take by mouth.     fluticasone (FLONASE) 50 MCG/ACT nasal spray instill TWO SPRAYS in each nostril daily (shake gently) 48 g 3   fluticasone-salmeterol (WIXELA INHUB) 250-50 MCG/ACT AEPB Inhale 1 puff into the lungs in the morning and at bedtime. 60 each 11   gabapentin (NEURONTIN) 100 MG capsule Take 1 capsule (100 mg total) by mouth in the morning.      meclizine (ANTIVERT) 25 MG tablet Take 1 tablet (25 mg total) by mouth 3 (three) times daily as needed for dizziness. 30 tablet 0   memantine (NAMENDA) 10 MG tablet Take 10 mg by mouth 2 (two) times daily.     mirabegron ER (MYRBETRIQ) 25 MG TB24 tablet Take 1 tablet (25 mg total) by mouth daily. 90 tablet 3   Multiple Vitamin (MULTIVITAMIN) tablet Take 1 tablet by mouth daily.     pantoprazole (PROTONIX) 40 MG tablet Take 40 mg by mouth daily.     PEDIASURE/FIBER (PEDIASURE/FIBER) LIQD Take 10 mLs by mouth.     polyethylene glycol (MIRALAX / GLYCOLAX) 17 g packet Take 8.5-17 g by mouth daily as needed.     rosuvastatin (CRESTOR) 20 MG tablet TAKE 1 TABLET BY MOUTH AT BEDTIME 30 tablet 1   Significant Hospital Events: Including procedures, antibiotic start and stop dates in addition to other pertinent events   12/17 - received TNK for stroke syndrome.   Interim History / Subjective:  Patient states that symptoms have resolved.  HR and BP began to recover before fluid and pressor administration.   Objective   Blood pressure (!) 112/47, pulse (!) 54, temperature 97.7 F (36.5 C), temperature source Oral, resp. rate (!) 22, weight 67.8 kg, SpO2 98%.        Intake/Output Summary (Last 24 hours) at 08/26/2023 1024 Last data filed at 08/26/2023 0900 Gross per 24 hour  Intake 124.67 ml  Output 500 ml  Net -375.33 ml   Filed Weights   08/26/23 0500 08/26/23 0642  Weight: 67.8 kg 67.8 kg   Examination: General: appears, stated age. No distress. HENT: sclerae normal. Oral mucosa moist.  Lungs: clear  Cardiovascular: JVP at 2cm ASA. HS normal.  Abdomen: soft and non-distended.  Extremities: no edema, negative psoas sign. Neuro: CN's normal. Mild right pronator drift, right leg weakness.   Telemetry during event shows sinus bradycardia.   Ancillary Tests Personally Reviewed:  Creatinine normal 0.68. Mild hypokalemia. CBC normal.   Assessment & Plan:   Presyncopal episode  consistent with neurocardiogenic syncope.  Possible stroke syndrome due to hypoperfusion through stenosed intracranial vessels.  HTN Dyslipidemia.   - Fluid challenge, wean norepinephrine to off. - Continue telemetry monitoring and may benefit from event monitor as outpatient, but presently no seeing anything that requires a pacemaker.  - Would avoid any medication likely to cause sedation or bradycardia.   Best Practice (right click and "Reselect all SmartList Selections" daily)   Diet/type: Regular consistency (see orders) DVT prophylaxis LMWH Pressure ulcer(s): N/A GI prophylaxis: N/A Lines: N/A Foley:  N/A Code Status:  full code Last date of multidisciplinary goals of care discussion [Patient and  husband updated 12/17]  Lynnell Catalan, MD [facility] ICU Physician [facility]  Pager: [phone] Or Epic Secure Chat After hours: [phone].  08/26/2023, 10:25 AM

## 2023-08-26 NOTE — ED Triage Notes (Signed)
Patient arrived with EMS from home , code stroke status , LSN 0500 , spouse reported that she fell this morning with right sided weakness/slurred speech and right facial droop.

## 2023-08-26 NOTE — H&P (Addendum)
NEUROLOGY CONSULT NOTE   Date of service: August 26, 2023 Patient Name: Kathy Baldwin MRN:  [id number] DOB:  August 29, 1946 Chief Complaint: "R sided weakness" Requesting Provider: No att. providers found  History of Present Illness  Kathy Baldwin is a 77 y.o. female with hx of HTN, HLD, prior strokes with residual mild R sided weakness, rent stroke in oct 2024 that was a punctate infarct with no deficit and was incidental who presents with aphasia and R sided weakness with a reported LKW of 0500.  Husband woke up when he heard her fall in the hallway on her way to bathroom. Patient called out for help. When he went to get her up, speech was gibberish and she was unable to get up. He called EMS and they noted R sided weakness and aphasia and she was brought in as a code stroke with presumed LKW of 0500. It was however, unclear if she woke up with these symptoms or symptoms developed after she woke up. Her aphasia cleared up significantly shortly after arrival to the ED with only hesitant non fluent speech. Patient was now able to provide history and reported that she woke up this AM and was at her baseline. She was able to get up from the bed and walk across the hallway without any issues. When she got to the end of the hallway, developed R leg weakness and speech difficulty. Despite resolution of aphasia, she had persistent R leg weakness.  CT head w/o contrast with no ICH, ASPECT of 10. CTA with no LVO, noted multifocal multivessel stenosis.  I discussed risks and benefits of tnkase with both patient and her husband along with its mode of administration and alternatives including watchful obervation for improvement in R leg weakness. They understand the risk of ICH and death associated with tnkase. She has had tnkase in the past and did not have any issues in the past. They both are in agreement that given the disbling nature of her R leg weakness, they want to go ahead with tnkase.  LKW:  0500 Modified rankin score: 0-Completely asymptomatic and back to baseline post- stroke IV Thrombolysis: yes, offered and given to patient after clarifying the LKW. I did not think that the small punctate R sided stroke noted back on MRI 2 months ago excludes her from getting tnkase. There was some delay in administering tnkase as her LKW was initially not clear and was found down at 0500 and patient was unable to clarify LKW initially due to aphasia and husband essentially woke up to her falling due to weakness on the right. LKW was clarified later when patient's aphasia had resolved. Patient was also hypertensive requiring hydralazine and cleviprex to control BP prior to giving her tnkase. EVT: not offered, due to no LVO.   NIHSS components Score: Comment  1a Level of Conscious 0[x]  1[]  2[]  3[]      1b LOC Questions 0[x]  1[]  2[]       1c LOC Commands 0[x]  1[]  2[]       2 Best Gaze 0[x]  1[]  2[]       3 Visual 0[x]  1[]  2[]  3[]      4 Facial Palsy 0[x]  1[]  2[]  3[]      5a Motor Arm - left 0[x]  1[]  2[]  3[]  4[]  UN[]    5b Motor Arm - Right 0[]  1[x]  2[]  3[]  4[]  UN[]    6a Motor Leg - Left 0[x]  1[]  2[]  3[]  4[]  UN[]    6b Motor Leg - Right 0[]  1[]  2[]  3[x]  4[]   UN[]    7 Limb Ataxia 0[x]  1[]  2[]  3[]  UN[]     8 Sensory 0[x]  1[]  2[]  UN[]      9 Best Language 0[]  1[x]  2[]  3[]      10 Dysarthria 0[x]  1[]  2[]  UN[]      11 Extinct. 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 Holly Bodily 2010   DNR no code (do not resuscitate) 03/20/2017   GERD (gastroesophageal reflux disease)    Hearing loss    History of gallstones     Hyperlipidemia    Hypertension    Hypertrophy of nasal turbinates    Leg mass, left    in calf muscle seeing surgeon tomorrow regarding   Lymphocytosis    Menopause age 30   Multiple food allergies    chocolate and cabbage   OSA on CPAP    Osteopenia Oct. 2014   Sleep apnea    Stroke (HCC) 09/11/2021   Thyroid nodule    Vertigo    Vitamin D deficiency disease     Past Surgical History:  Procedure Laterality Date   BREAST CYST ASPIRATION Right    negative   CARPAL TUNNEL RELEASE Right 2015   CATARACT EXTRACTION W/PHACO Left 09/30/2018   Procedure: CATARACT EXTRACTION PHACO AND INTRAOCULAR LENS PLACEMENT (IOC)  LEFT;  Surgeon: Lockie Mola, MD;  Location: [facility];  Service: Ophthalmology;  Laterality: Left;  sleep apnea   CATARACT EXTRACTION W/PHACO Right 10/28/2018   Procedure: CATARACT EXTRACTION PHACO AND INTRAOCULAR LENS PLACEMENT (IOC)  RIGHT;  Surgeon: Lockie Mola, MD;  Location: [facility];  Service: Ophthalmology;  Laterality: Right;   CHOLECYSTECTOMY  1990   COLONOSCOPY  04/25/2008   CYST EXCISION Left 2020   L popliteal/peroneal cyst excision s/p residual numbness   DILATION AND CURETTAGE OF UTERUS  1982   s/p miscarriage   ESOPHAGOGASTRODUODENOSCOPY  08/2020   chronic gastritis, neg H pylori, no Barretts, no rpt needed [facility])    Family History: Family History  Problem Relation Age of Onset   Healthy Mother    Arthritis Mother    Lung disease Father        black lung   Heart disease Father    Hyperlipidemia Father    Hypertension Father    Emphysema Sister    Healthy Sister    Cancer Brother        unsure of type   Asthma Daughter    Heart disease Sister    Thyroid disease Sister    Stroke Maternal Grandmother    Hypertension Maternal Grandmother    Diabetes Maternal Grandmother    Diabetes Sister    Healthy Sister    Breast cancer Neg Hx     Social History  reports that she has never smoked. She has never  used smokeless tobacco. She reports that she does not currently use alcohol. She reports that she does not use drugs.  Allergies  Allergen Reactions   Cabbage Other (See Comments)    Hypersensitivity on allergy testing  Chocolate Other (See Comments)    Hypersensitivity on allergy testing   Statins Other (See Comments)    Zocor and Pravastatin--leg cramps Other reaction(s): Other (See Comments) Zocor and Pravastatin--leg cramps    Medications  No current facility-administered medications for this encounter.  Current Outpatient Medications:    amLODipine (NORVASC) 5 MG tablet, Take 0.5 tablets (2.5 mg total) by mouth daily., Disp: 90 tablet, Rfl: 3   aspirin EC 81 MG EC tablet, Take 1 tablet (81 mg total) by mouth daily. Swallow whole., Disp: 21 tablet, Rfl: 0   buPROPion ER (WELLBUTRIN SR) 100 MG 12 hr tablet, Take 1 tablet (100 mg total) by mouth daily., Disp: 90 tablet, Rfl: 4   Calcium Carb-Cholecalciferol (CALCIUM 600+D) 600-800 MG-UNIT TABS, Take by mouth daily. 2 capsule daily, Disp: , Rfl:    clopidogrel (PLAVIX) 75 MG tablet, Take 1 tablet (75 mg total) by mouth daily., Disp: 90 tablet, Rfl: 0   COLLAGEN PO, Take by mouth., Disp: , Rfl:    Cyanocobalamin (B-12) 2500 MCG TABS, Take 1 tablet by mouth daily. Pt taking 1/2 of a 5000 mcg tablet, Disp: , Rfl:    donepezil (ARICEPT) 10 MG tablet, Take 10 mg by mouth at bedtime. , Disp: , Rfl:    famotidine (PEPCID) 20 MG tablet, Take 1 tablet (20 mg total) by mouth 2 (two) times daily. For heartburn/indigestion, Disp: 180 tablet, Rfl: 4   FIBER PO, Take by mouth., Disp: , Rfl:    fluticasone (FLONASE) 50 MCG/ACT nasal spray, instill TWO SPRAYS in each nostril daily (shake gently), Disp: 48 g, Rfl: 3   fluticasone-salmeterol (WIXELA INHUB) 250-50 MCG/ACT AEPB, Inhale 1 puff into the lungs in the morning and at bedtime., Disp: 60 each, Rfl: 11   gabapentin (NEURONTIN) 100 MG capsule, Take 1 capsule (100 mg total) by mouth in the  morning., Disp: , Rfl:    meclizine (ANTIVERT) 25 MG tablet, Take 1 tablet (25 mg total) by mouth 3 (three) times daily as needed for dizziness., Disp: 30 tablet, Rfl: 0   memantine (NAMENDA) 10 MG tablet, Take 10 mg by mouth 2 (two) times daily., Disp: , Rfl:    mirabegron ER (MYRBETRIQ) 25 MG TB24 tablet, Take 1 tablet (25 mg total) by mouth daily., Disp: 90 tablet, Rfl: 3   Multiple Vitamin (MULTIVITAMIN) tablet, Take 1 tablet by mouth daily., Disp: , Rfl:    pantoprazole (PROTONIX) 40 MG tablet, Take 40 mg by mouth daily., Disp: , Rfl:    PEDIASURE/FIBER (PEDIASURE/FIBER) LIQD, Take 10 mLs by mouth., Disp: , Rfl:    polyethylene glycol (MIRALAX / GLYCOLAX) 17 g packet, Take 8.5-17 g by mouth daily as needed., Disp: , Rfl:    rosuvastatin (CRESTOR) 20 MG tablet, TAKE 1 TABLET BY MOUTH AT BEDTIME, Disp: 30 tablet, Rfl: 1  Vitals   Vitals:   08/26/23 0500  Weight: 67.8 kg    Body mass index is 27.34 kg/m.  Physical Exam   General: Laying comfortably in bed; in no acute distress.  HENT: Normal oropharynx and mucosa. Normal external appearance of ears and nose.  Neck: Supple, no pain or tenderness  CV: No JVD. No peripheral edema.  Pulmonary: Symmetric Chest rise. Normal respiratory effort.  Abdomen: Soft to touch, non-tender.  Ext: No cyanosis, edema, or deformity  Skin: No rash. Normal palpation of skin.   Musculoskeletal: Normal digits and nails by inspection. No clubbing.   Neurologic Examination  Mental status/Cognition: Alert, oriented to self, place, month and year,  good attention.  Speech/language: hesitant, non fluent, comprehension intact, object naming intact, repetition intact.  Cranial nerves:   CN II Pupils equal and reactive to light, no VF deficits    CN III,IV,VI EOM intact, no gaze preference or deviation, no nystagmus    CN V normal sensation in V1, V2, and V3 segments bilaterally    CN VII no asymmetry, no nasolabial fold flattening    CN VIII normal hearing  to speech    CN IX & X normal palatal elevation, no uvular deviation    CN XI 5/5 head turn and 5/5 shoulder shrug bilaterally    CN XII midline tongue protrusion    Motor:  Muscle bulk: normal, tone normal, pronator drift noted in RUE. Mvmt Root Nerve  Muscle Right Left Comments  SA C5/6 Ax Deltoid 5 5   EF C5/6 Mc Biceps 4+ 5   EE C6/7/8 Rad Triceps 4+ 5   WF C6/7 Med FCR     WE C7/8 PIN ECU     F Ab C8/T1 U ADM/FDI 4+ 5   HF L1/2/3 Fem Illopsoas 1 5   KE L2/3/4 Fem Quad 2 5   DF L4/5 D Peron Tib Ant 4 5   PF S1/2 Tibial Grc/Sol 4 5    Sensation:  Light touch Slightly decreased in RLE to touch   Pin prick    Temperature    Vibration   Proprioception    Coordination/Complex Motor:  - Finger to Nose intact BL - Heel to shin unable to do with RLE. - Rapid alternating movement are slowed BL - Gait: deferred.  Labs/Imaging/Neurodiagnostic studies   CBC: No results for input(s): "WBC", "NEUTROABS", "HGB", "HCT", "MCV", "PLT" in the last 168 hours. Basic Metabolic Panel:  Lab Results  Component Value Date   NA 141 07/02/2023   K 3.7 07/02/2023   CO2 30 07/02/2023   GLUCOSE 114 (H) 07/02/2023   BUN 17 07/02/2023   CREATININE 0.95 07/02/2023   CALCIUM 9.5 07/02/2023   GFRNONAA >60 06/25/2023   GFRAA 87 10/08/2018   Lipid Panel:  Lab Results  Component Value Date   LDLCALC 56 06/24/2023   HgbA1c:  Lab Results  Component Value Date   HGBA1C 5.7 (H) 06/24/2023   Urine Drug Screen:     Component Value Date/Time   LABOPIA NONE DETECTED 06/23/2023 1341   COCAINSCRNUR NONE DETECTED 06/23/2023 1341   LABBENZ NONE DETECTED 06/23/2023 1341   AMPHETMU NONE DETECTED 06/23/2023 1341   THCU NONE DETECTED 06/23/2023 1341   LABBARB NONE DETECTED 06/23/2023 1341    Alcohol Level No results found for: "ETH" INR  Lab Results  Component Value Date   INR 1.1 09/11/2021   APTT  Lab Results  Component Value Date   APTT 26 09/11/2021   AED levels: No results found for:  "PHENYTOIN", "ZONISAMIDE", "LAMOTRIGINE", "LEVETIRACETA"  CT Head without contrast(Personally reviewed): CTH was negative for a large hypodensity concerning for a large territory infarct or hyperdensity concerning for an ICH  CT angio Head and Neck with contrast(Personally reviewed): No LVO.  MRI Brain(Personally reviewed): Pending  ASSESSMENT   Kathy Baldwin is a 77 y.o. female with hx of HTN, HLD, prior strokes with residual mild R sided weakness, rent stroke in oct 2024 that was a punctate infarct with no deficit and was incidental who presents with aphasia and R sided weakness with a LKW of 0500. Her aphasia resolved but still had persistent R leg weakness worse than her baseline  with no improvement. CT Head with no ICH, ASPECTS of 10. CTA with multifocal multivessel stenosis but no LVO. Tnkase was discussed with both patient and her husband and they consented to tnkase administration.  RECOMMENDATIONS  Plan: - Frequent NeuroChecks for post tNK care per stroke unit protocol: - Initial CTH demonstrated no acute hemorrhage or mass - MRI Brain - pending - CTA - no LVO - TTE - pending - Lipid Panel: LDL - pending  - Statin: continue home crestor - HbA1c: pending - Antithrombotic: Start ASA 81 mg daily if 24 h CTH does not show acute hemorrhage - DVT prophylaxis: SCDs. Pharmacologic prophylaxis if 24 h CTH does not demonstrate acute hemorrhage - Systolic Blood Pressure goal: < 180 mm Hg - Telemetry monitoring for arrhythmia: 72 hours - Swallow screen - ordered - PT/OT/SLP consults - stroke team to follow. ______________________________________________________________________    Welton Flakes, MD [facility]

## 2023-08-26 NOTE — Progress Notes (Addendum)
Upon assessment at 0957, pts HR sustaining in the 40s w/ drops down to 25, BP 72/39 (48) w/ a repeat of 76/40 (52) at 1000.  Dr. Pearlean Brownie paged, Dr. Denese Killings on floor and came to bedside.  Dr. Denese Killings ordered 500 mL fluid bolus and norepinephrine infusion.  Pts NIH stayed the same at a 4 w/ RLE drift, R facial droop, mild R sensory loss, and mild aphasia. New dizziness present.  No headache. Seemed to occur shortly after sitting up to eat breakfast.  By 1010, dizziness gone, BP within normal parameters while on 3 of norepinephrine, and HR 50s.  Norepinephrine stopped at 1031 with BP within normal parameters.

## 2023-08-26 NOTE — TOC CM/SW Note (Signed)
Transition of Care [facility]) - Inpatient Brief Assessment   Patient Details  Name: [name] MRN: [id number] Date of Birth: [birth year]  Transition of Care [facility] CM/SW Contact:    Mearl Latin, LCSW Phone Number: 08/26/2023, 9:26 AM   Clinical Narrative: Patient admitted from home with spouse undergoing stroke workup. Patient recently was active with [facility]. Will continue to follow for needs.    Transition of Care Asessment: Insurance and Status: Insurance coverage has been reviewed Patient has primary care physician: Yes Home environment has been reviewed: From home Prior level of function:: Mod Indep   Social Drivers of Health Review: SDOH reviewed no interventions necessary Readmission risk has been reviewed: Yes Transition of care needs: transition of care needs identified, TOC will continue to follow

## 2023-08-26 NOTE — ED Notes (Signed)
Transported to CT scan

## 2023-08-26 NOTE — ED Provider Notes (Signed)
[facility] Emergency Department Provider Note MRN:  [id number]  Arrival date & time: 08/26/23     Chief Complaint   Code stroke History of Present Illness   Kathy Baldwin is a 77 y.o. year-old female with a history of stroke presenting to the ED with chief complaint of stroke.  Patient woke up at 5 AM feeling normal and ambulatory, shortly after had sudden onset strength deficit to right side, speech disturbance.  Code stroke initiation.  Review of Systems  A thorough review of systems was obtained and all systems are negative except as noted in the HPI and PMH.   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 Holly Bodily 2010   DNR no code (do not resuscitate) 03/20/2017   GERD (gastroesophageal reflux disease)    Hearing loss    History of gallstones    Hyperlipidemia    Hypertension    Hypertrophy of nasal turbinates    Leg mass, left    in calf muscle seeing surgeon tomorrow regarding   Lymphocytosis    Menopause age 55   Multiple food allergies    chocolate and cabbage   OSA on CPAP    Osteopenia Oct. 2014   Sleep apnea    Stroke (HCC) 09/11/2021   Thyroid nodule    Vertigo    Vitamin D deficiency disease     Past Surgical History:  Procedure Laterality Date   BREAST CYST ASPIRATION Right    negative   CARPAL TUNNEL RELEASE Right 2015   CATARACT EXTRACTION W/PHACO Left 09/30/2018   Procedure: CATARACT EXTRACTION PHACO AND INTRAOCULAR LENS PLACEMENT (IOC)  LEFT;  Surgeon: Lockie Mola, MD;  Location: [facility];  Service: Ophthalmology;  Laterality: Left;  sleep apnea   CATARACT EXTRACTION W/PHACO Right 10/28/2018   Procedure: CATARACT EXTRACTION  PHACO AND INTRAOCULAR LENS PLACEMENT (IOC)  RIGHT;  Surgeon: Lockie Mola, MD;  Location: [facility];  Service: Ophthalmology;  Laterality: Right;   CHOLECYSTECTOMY  1990   COLONOSCOPY  04/25/2008   CYST EXCISION Left 2020   L popliteal/peroneal cyst excision s/p residual numbness   DILATION AND CURETTAGE OF UTERUS  1982   s/p miscarriage   ESOPHAGOGASTRODUODENOSCOPY  08/2020   chronic gastritis, neg H pylori, no Barretts, no rpt needed [facility])    Family History  Problem Relation Age of Onset   Healthy Mother    Arthritis Mother    Lung disease Father        black lung   Heart disease Father    Hyperlipidemia Father    Hypertension Father    Emphysema Sister    Healthy Sister    Cancer Brother        unsure of type   Asthma Daughter    Heart disease Sister    Thyroid disease Sister    Stroke Maternal Grandmother    Hypertension Maternal Grandmother    Diabetes Maternal Grandmother    Diabetes Sister    Healthy Sister    Breast cancer Neg Hx     Social History   Socioeconomic History   Marital status: Married    Spouse name: [name]   Number  of children: 1   Years of education: Not on file   Highest education level: Bachelor's degree (e.g., BA, AB, BS)  Occupational History   Occupation: Retired  Tobacco Use   Smoking status: Never   Smokeless tobacco: Never   Tobacco comments:    Never smoked, so cannot quit...  Vaping Use   Vaping status: Never Used  Substance and Sexual Activity   Alcohol use: Not Currently    Alcohol/week: 0.0 standard drinks of alcohol    Comment: Occasional celebratory drink (holidays, etc.) only.   Drug use: No   Sexual activity: Yes    Partners: Male    Birth control/protection: Post-menopausal  Other Topics Concern   Not on file  Social History Narrative   Lives with husband [name]   Occ: retired Diplomatic Services operational officer   Edu: BA   Act:    Diet:    Social Drivers of Corporate investment banker Strain: Low Risk   (05/01/2023)   Overall Financial Resource Strain (CARDIA)    Difficulty of Paying Living Expenses: Not hard at all  Food Insecurity: No Food Insecurity (06/27/2023)   Hunger Vital Sign    Worried About Running Out of Food in the Last Year: Never true    Ran Out of Food in the Last Year: Never true  Transportation Needs: No Transportation Needs (06/27/2023)   PRAPARE - Administrator, Civil Service (Medical): No    Lack of Transportation (Non-Medical): No  Physical Activity: Inactive (05/01/2023)   Exercise Vital Sign    Days of Exercise per Week: 0 days    Minutes of Exercise per Session: 0 min  Stress: No Stress Concern Present (05/01/2023)   [employer] of Occupational Health - Occupational Stress Questionnaire    Feeling of Stress : Not at all  Social Connections: Moderately Isolated (05/01/2023)   Social Connection and Isolation Panel [NHANES]    Frequency of Communication with Friends and Family: More than three times a week    Frequency of Social Gatherings with Friends and Family: More than three times a week    Attends Religious Services: Never    Database administrator or Organizations: No    Attends Banker Meetings: Never    Marital Status: Married  Catering manager Violence: Not At Risk (06/23/2023)   Humiliation, Afraid, Rape, and Kick questionnaire    Fear of Current or Ex-Partner: No    Emotionally Abused: No    Physically Abused: No    Sexually Abused: No     Physical Exam   Vitals:   08/26/23 0715 08/26/23 0730  BP: 137/61 (!) 141/63  Pulse: 62 77  Resp: 18 19  Temp:    SpO2: 100% 100%    CONSTITUTIONAL: Well-appearing, NAD NEURO/PSYCH:  Alert and oriented x 3, decreased right leg strength, aphasia EYES:  eyes equal and reactive ENT/NECK:  no LAD, no JVD CARDIO: Regular rate, well-perfused, normal S1 and S2 PULM:  CTAB no wheezing or rhonchi GI/GU:  non-distended, non-tender MSK/SPINE:  No gross deformities, no edema SKIN:   no rash, atraumatic   *Additional and/or pertinent findings included in MDM below  Diagnostic and Interventional Summary    EKG Interpretation Date/Time:  Tuesday August 26 2023 06:42:06 EST Ventricular Rate:  63 PR Interval:  189 QRS Duration:  100 QT Interval:  487 QTC Calculation: 499 R Axis:   52  Text Interpretation: Sinus rhythm Anteroseptal infarct, age indeterminate Confirmed by Kennis Carina [phone]) on 08/26/2023  7:42:16 AM       Labs Reviewed  COMPREHENSIVE METABOLIC PANEL - Abnormal; Notable for the following components:      Result Value   Potassium 3.4 (*)    Total Protein 6.1 (*)    All other components within normal limits  ETHANOL  PROTIME-INR  CBC  DIFFERENTIAL  RAPID URINE DRUG SCREEN, HOSP PERFORMED  URINALYSIS, ROUTINE W REFLEX MICROSCOPIC  PROTIME-INR  APTT  CG4 I-STAT (LACTIC ACID)  CBG MONITORING, ED  I-STAT CHEM 8, ED    CT ANGIO HEAD NECK W WO CM (CODE STROKE)  Final Result  Addendum (preliminary) 1 of 1  ADDENDUM REPORT: 08/26/2023 06:36    ADDENDUM:  History of persisting leg weakness and the ACA branches were  reinspected, a small peripheral branch cut off is suspected on the  left. No large vessel occlusion.      Electronically Signed    By: Tiburcio Pea M.D.    On: 08/26/2023 06:36      Final    CT HEAD CODE STROKE WO CONTRAST  Final Result    MR BRAIN WO CONTRAST    (Results Pending)    Medications  clevidipine (CLEVIPREX) infusion 0.5 mg/mL (0 mg/hr Intravenous Stopped 08/26/23 0635)   stroke: early stages of recovery book (has no administration in time range)  acetaminophen (TYLENOL) tablet 650 mg (has no administration in time range)    Or  acetaminophen (TYLENOL) 160 MG/5ML solution 650 mg (has no administration in time range)    Or  acetaminophen (TYLENOL) suppository 650 mg (has no administration in time range)  senna-docusate (Senokot-S) tablet 1 tablet (has no administration in time range)   pantoprazole (PROTONIX) injection 40 mg (has no administration in time range)  iohexol (OMNIPAQUE) 350 MG/ML injection 75 mL (75 mLs Intravenous Contrast Given 08/26/23 0606)  tenecteplase (TNKASE) injection for Stroke 17 mg (17 mg Intravenous Given 08/26/23 1610)  hydrALAZINE (APRESOLINE) injection 10 mg (10 mg Intravenous Given 08/26/23 0621)  labetalol (NORMODYNE) injection 10 mg (10 mg Intravenous Given 08/26/23 0630)     Procedures  /  Critical Care .Critical Care  Performed by: Sabas Sous, MD Authorized by: Sabas Sous, MD   Critical care provider statement:    Critical care time (minutes):  32   Critical care was necessary to treat or prevent imminent or life-threatening deterioration of the following conditions:  CNS failure or compromise   Critical care was time spent personally by me on the following activities:  Development of treatment plan with patient or surrogate, discussions with consultants, evaluation of patient's response to treatment, examination of patient, ordering and review of laboratory studies, ordering and review of radiographic studies, ordering and performing treatments and interventions, pulse oximetry, re-evaluation of patient's condition and review of old charts   ED Course and Medical Decision Making  Initial Impression and Ddx Concern for acute ischemic stroke, neurology administering TNK after blood pressure control.  To be admitted to neuro ICU.  Past medical/surgical history that increases complexity of ED encounter: Stroke  Interpretation of Diagnostics I personally reviewed the EKG and my interpretation is as follows: Sinus rhythm  Labs pending  Patient Reassessment and Ultimate Disposition/Management     Admitted to neuro ICU.  Patient management required discussion with the following services or consulting groups:  Neurology  Complexity of Problems Addressed Acute illness or injury that poses threat of life of bodily  function  Additional Data Reviewed and Analyzed Further history obtained from: EMS on  arrival  Additional Factors Impacting ED Encounter Risk Consideration of hospitalization  Elmer Sow. Pilar Plate, MD [facility] Emergency Medicine [facility] [email or web address]  Final Clinical Impressions(s) / ED Diagnoses     ICD-10-CM   1. Cerebrovascular accident (CVA), unspecified mechanism (HCC)  I63.9       ED Discharge Orders     None        Discharge Instructions Discussed with and Provided to Patient:   Discharge Instructions   None      Sabas Sous, MD 08/26/23 [phone]

## 2023-08-26 NOTE — Code Documentation (Signed)
Stroke Response Nurse Documentation Code Documentation  TAYANNA BRUMIT is a 77 y.o. female arriving to [facility]  via [facility] on 12/17 with past medical hx of HTN, CVA without residual, HLD, OSA on CPAP. On aspirin 81 mg daily and clopidogrel 75 mg daily. Code stroke was activated by EMS.   Patient from home where she was LKW at 0500 and now complaining of right sided weakness and aphasia .   Stroke team at the bedside on patient arrival. Labs drawn and patient cleared for CT by Dr. Pilar Plate. Patient to CT with team. NIHSS 5, see documentation for details and code stroke times. Patient with right arm weakness, right leg weakness, and Expressive aphasia  on exam. The following imaging was completed:  CT Head and CTA. Patient is a candidate for IV Thrombolytic due to fixed neurological deficit. Patient is not a candidate for IR due to No LVO.   Care Plan: TNKase, cleviprex.   Bedside handoff with ED RN Reita Cliche.    Rose Fillers  Rapid Response RN

## 2023-08-26 NOTE — ED Notes (Signed)
Patient received TNK /Cleviprex at CT scan , delay in TNK due to elevated BP.

## 2023-08-26 NOTE — Telephone Encounter (Signed)
Mandy from [facility] called asking if rx can be sent in for rosuvastatin (CRESTOR) 20 MG tablet? Angelica Chessman states the pt was on 10mg  with Dr. Reece Agar, but switched to 20mg , with a hosp provider. Angelica Chessman states the pt's husband mentioned the pt currently being in the hosp, due to a stroke. Angelica Chessman states she needs a rx from Dr. Reece Agar for the new increase in meds. Call back # [phone]

## 2023-08-26 NOTE — Progress Notes (Addendum)
STROKE TEAM PROGRESS NOTE   BRIEF HPI Ms. Kathy Baldwin is a 77 y.o. female with history of hypertension, hyperlipidemia, mild dementia and prior strokes with residual mild right-sided weakness, including a punctate stroke in October 2024 presenting with acute onset right-sided weakness.  Patient was noted to fall in the hallway on her way to the bathroom at home.  Her husband noted that her speech was unintelligible and she was unable to get up.  She was brought to the ED, and aphasia had improved while en route.  TNK was administered due to continuing symptoms of right leg weakness. Later in the morning of 12/17, patient had a presyncopal episode when sitting up in bed to eat her breakfast with heart rate decreasing to the 30s and systolic blood pressure decreasing to the 70s.  Fluid bolus was given and norepinephrine was used for short period of time to maintain blood pressure.  NIH on Admission 5   SIGNIFICANT HOSPITAL EVENTS 12/17-patient admitted and TNK given, presyncopal episode later in the morning  INTERIM HISTORY/SUBJECTIVE Overnight, patient was hemodynamically stable, but in the morning, she had a presyncopal episode when sitting up in bed where her heart rate decreased to the 30s and systolic blood pressure decreased to the 70s.  This resolved with fluid bolus and short-term use of norepinephrine.  OBJECTIVE  CBC    Component Value Date/Time   WBC 6.7 08/26/2023 1038   RBC 4.15 08/26/2023 1038   HGB 12.4 08/26/2023 1038   HGB 14.2 05/02/2015 0819   HCT 38.5 08/26/2023 1038   HCT 41.9 05/02/2015 0819   PLT 235 08/26/2023 1038   PLT 267 05/02/2015 0819   MCV 92.8 08/26/2023 1038   MCV 89 05/02/2015 0819   MCH 29.9 08/26/2023 1038   MCHC 32.2 08/26/2023 1038   RDW 13.9 08/26/2023 1038   RDW 13.8 05/02/2015 0819   LYMPHSABS 1.9 08/26/2023 1038   LYMPHSABS 3.6 (H) 05/02/2015 0819   MONOABS 0.5 08/26/2023 1038   EOSABS 0.1 08/26/2023 1038   EOSABS 0.2 05/02/2015  0819   BASOSABS 0.1 08/26/2023 1038   BASOSABS 0.0 05/02/2015 0819    BMET    Component Value Date/Time   NA 141 08/26/2023 0556   NA 143 12/04/2015 0851   K 3.4 (L) 08/26/2023 0556   CL 108 08/26/2023 0556   CO2 22 08/26/2023 0556   GLUCOSE 95 08/26/2023 0556   BUN 17 08/26/2023 0556   BUN 11 12/04/2015 0851   CREATININE 0.68 08/26/2023 0556   CREATININE 1.10 (H) 05/02/2023 1458   CALCIUM 9.5 08/26/2023 0556   GFRNONAA >60 08/26/2023 0556   GFRNONAA 75 10/08/2018 1156    IMAGING past 24 hours CT ANGIO HEAD NECK W WO CM (CODE STROKE) Addendum Date: 08/26/2023 ADDENDUM REPORT: 08/26/2023 06:36 ADDENDUM: History of persisting leg weakness and the ACA branches were reinspected, a small peripheral branch cut off is suspected on the left. No large vessel occlusion. Electronically Signed   By: Tiburcio Pea M.D.   On: 08/26/2023 06:36   Result Date: 08/26/2023 CLINICAL DATA:  Slurred speech and right-sided deficits EXAM: CT ANGIOGRAPHY HEAD AND NECK WITH AND WITHOUT CONTRAST TECHNIQUE: Multidetector CT imaging of the head and neck was performed using the standard protocol during bolus administration of intravenous contrast. Multiplanar CT image reconstructions and MIPs were obtained to evaluate the vascular anatomy. Carotid stenosis measurements (when applicable) are obtained utilizing NASCET criteria, using the distal internal carotid diameter as the denominator. RADIATION DOSE REDUCTION: This exam  was performed according to the departmental dose-optimization program which includes automated exposure control, adjustment of the mA and/or kV according to patient size and/or use of iterative reconstruction technique. CONTRAST:  75mL OMNIPAQUE IOHEXOL 350 MG/ML SOLN COMPARISON:  06/23/2023 FINDINGS: CTA NECK FINDINGS Aortic arch: Atheromatous calcification with 3 vessel branching. Right carotid system: Atheromatous calcification primarily at the bifurcation, no flow reducing stenosis or  ulceration. Left carotid system: Mild atheromatous plaque at the bifurcation. No stenosis or ulceration. Vertebral arteries: No proximal subclavian stenosis. The left vertebral artery is dominant. No vertebral stenosis, beading, or dissection. Skeleton: Multilevel cervical spine degeneration. Other neck: No acute finding Upper chest: No acute finding Review of the MIP images confirms the above findings CTA HEAD FINDINGS Anterior circulation: Atheromatous calcification of the cavernous carotids. No flow reducing stenosis. The right ICA is larger than the left due to circle-of-Willis variation. The left A1 segment is hypoplastic or aplastic. No branch occlusion, beading, or aneurysm. Posterior circulation: Left dominant vertebral artery. The vertebral and basilar arteries are smoothly contoured and diffusely patent. No branch occlusion, beading, or aneurysm. Venous sinuses: Unremarkable Anatomic variants: None significant Review of the MIP images confirms the above findings IMPRESSION: 1. No emergent finding. 2. Atherosclerosis without flow reducing stenosis or irregularity of major arteries in the head and neck. Electronically Signed: By: Tiburcio Pea M.D. On: 08/26/2023 06:14   CT HEAD CODE STROKE WO CONTRAST Result Date: 08/26/2023 CLINICAL DATA:  Code stroke. Slurred speech and right-sided deficits. EXAM: CT HEAD WITHOUT CONTRAST TECHNIQUE: Contiguous axial images were obtained from the base of the skull through the vertex without intravenous contrast. RADIATION DOSE REDUCTION: This exam was performed according to the departmental dose-optimization program which includes automated exposure control, adjustment of the mA and/or kV according to patient size and/or use of iterative reconstruction technique. COMPARISON:  06/23/2023 FINDINGS: Brain: No evidence of acute infarction, hemorrhage, hydrocephalus, extra-axial collection or mass lesion/mass effect. Small chronic bilateral cerebellar infarcts, non  progressed. Streak artifact affects the brainstem without detected infarct. Generalized cortical atrophy. Generalized low-density in the cerebral white matter. Vascular: No hyperdense vessel or unexpected calcification. Skull: Normal. Negative for fracture or focal lesion. Sinuses/Orbits: No acute finding. Other: Prelim sent in epic chat. ASPECTS [facility] Stroke Program Early CT Score) - Ganglionic level infarction (caudate, lentiform nuclei, internal capsule, insula, M1-M3 cortex): 7 - Supraganglionic infarction (M4-M6 cortex): 3 Total score (0-10 with 10 being normal): 10 IMPRESSION: 1. No acute finding. 2. Chronic small vessel disease and small cerebellar infarcts. Electronically Signed   By: Tiburcio Pea M.D.   On: 08/26/2023 06:04    Vitals:   08/26/23 1145 08/26/23 1200 08/26/23 1230 08/26/23 1300  BP: 128/70 128/70 (!) 122/51 (!) 122/54  Pulse: 62 70 (!) 58 63  Resp: (!) 23 (!) 23 20 17   Temp:  97.8 F (36.6 C)    TempSrc:  Oral    SpO2: 94% 96% 94% 97%  Weight:         PHYSICAL EXAM General:  Alert, well-nourished, well-developed patient in no acute distress Psych:  Mood and affect appropriate for situation CV: Regular rate and rhythm on monitor Respiratory:  Regular, unlabored respirations on room air  NEURO:  Mental Status: AA&Ox3, patient is able to give clear and coherent history.  She is able to name 7 animals with 4 feet when asked, 3 out of 3 registration, 0 out of 3 recall Speech/Language: speech is without dysarthria or aphasia.  Repetition slightly impaired but naming intact  Cranial  Nerves:  II: PERRL. III, IV, VI: EOMI. Eyelids elevate symmetrically.  VII: Face is symmetrical resting and smiling VIII: hearing intact to voice. IX, X: Phonation is normal.  XII: tongue is midline without fasciculations. Motor: 5/5 strength to bilateral upper extremities and left lower extremity, drift noted in right leg Tone: is normal and bulk is normal Sensation- Intact to  light touch bilaterally but slightly diminished on the right Coordination: FTN intact bilaterally Gait- deferred  Most Recent NIH  1a Level of Conscious.: 0 1b LOC Questions: 0 1c LOC Commands: 0 2 Best Gaze: 0 3 Visual: 0 4 Facial Palsy: 0 5a Motor Arm - left: 0 5b Motor Arm - Right: 0 6a Motor Leg - Left: 0 6b Motor Leg - Right: 2 7 Limb Ataxia: 0 8 Sensory: 1 9 Best Language: 1 10 Dysarthria: 0 11 Extinct. and Inatten.: 0 TOTAL: 4   ASSESSMENT/PLAN  Acute Ischemic Infarct:  left sided infarct s/p TNK Etiology: Uncertain at this time Code Stroke CT head No acute abnormality. ASPECTS 10.    CTA head & neck small peripheral left ACA branch cutoff, atherosclerosis without flow-limiting stenosis in major arteries of head and neck TCD pending MRI pending Carotid Doppler pending 2D Echo pending LDL 56 HgbA1c 5.7 VTE prophylaxis -SCDs aspirin 81 mg daily and clopidogrel 75 mg daily prior to admission, now on No antithrombotic as she is less than 24 hours from TNK administration Therapy recommendations:  Pending Disposition: Pending  Presyncopal episode Patient had presyncopal episode while sitting up in bed, heart rate decreased to 30s, systolic blood pressure decreased to 70s IV fluid bolus given and norepinephrine used for short time Given calcification seen in intracranial arteries, it is difficult to tell if there is much stenosis present which could cause strokelike symptoms with presyncopal episodes, will obtain TCD and carotid ultrasound  Hx of Stroke/TIA Patient has a history of a stroke some years ago with residual mild right-sided weakness She did have a punctate stroke in October 2024 however, due to small size, this was not a contraindication for TNK  Hypertension Home meds: Amlodipine 5 mg daily Stable Blood Pressure Goal: BP less than 180/105   Hyperlipidemia Home meds: Rosuvastatin 20 mg daily, resumed in hospital LDL 56, goal < 70 Continue statin at  discharge  Other Stroke Risk Factors Advanced age   Other Active Problems Mild dementia-continue home Namenda and Aricept  Hospital day # 0  Patient seen by NP with MD, MD to edit note as needed. Cortney E Ernestina Columbia , MSN, AGACNP-BC [facility] See Amion for schedule and pager information 08/26/2023 1:59 PM   STROKE MD NOTE :  I have personally obtained history,examined this patient, reviewed notes, independently viewed imaging studies, participated in medical decision making and plan of care.ROS completed by me personally and pertinent positives fully documented  I have made any additions or clarifications directly to the above note. Agree with note above.  Patient presented with transient expressive aphasia which improved but persistent right leg weakness and was treated with IV TNK with improvement in her deficits.  She still has mild right leg weakness.  She remains at significant risk for neurological worsening, development of post TNK hemorrhage and recurrent strokes.  Continue close neurological monitoring and strict blood pressure control as per post TNK protocol.  Bedrest today as patient became presyncopal when she sat up earlier today.  Check MRI scan of the brain, echocardiogram and continue cardiac monitoring.  Hold antiplatelet therapy till  24 hours post TNK.  Will likely change to aspirin and Brilinta for stroke prevention due to Plavix failure. Patient may also consider possible participation in the [facility] stroke trial (standard of care antiplatelet with and without factor XI inhibitor asundexian ) and she and her husband and daughter were given information to review and decide.  It was made clear study participation is voluntary and patient can withdraw consent at any point if they are not interested.  Patient will get the same excellent medical care is irrespective of whether patient decides to participate in the study or not This patient is critically ill and at  significant risk of neurological worsening, death and care requires constant monitoring of vital signs, hemodynamics,respiratory and cardiac monitoring, extensive review of multiple databases, frequent neurological assessment, discussion with family, other specialists and medical decision making of high complexity.I have made any additions or clarifications directly to the above note.This critical care time does not reflect procedure time, or teaching time or supervisory time of PA/NP/Med Resident etc but could involve care discussion time.  I spent 30 minutes of neurocritical care time  in the care of  this patient.     Delia Heady, MD Medical Director [facility] St[facility] Pager: [phone] 08/26/2023 4:39 PM   To contact Stroke Continuity provider, please refer to [email or web address]. After hours, contact General Neurology

## 2023-08-26 NOTE — Evaluation (Signed)
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   Wrist fracture, closed, left, sequela 06/24/2023   Fall with injury 06/02/2023   Right wrist injury, subsequent encounter 06/02/2023   Renal insufficiency 05/09/2023   Dysuria 02/09/2023   MDD (major depressive disorder), single episode, 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/2023   Peroneal nerve palsy, 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   Loose stools 04/18/2022   Asthma 11/09/2021   Exertional dyspnea 11/06/2021   Other fatigue 09/26/2021   History of stroke 09/11/2021   Bradycardia 04/27/2021   Urge urinary incontinence 03/06/2021   Medicare annual wellness visit, subsequent 03/05/2021   Advanced directives, counseling/discussion 03/05/2021   Seborrheic keratosis 12/13/2020   Mild dementia (HCC) 11/19/2018   Mass of leg, left 10/23/2018   History of cerebellar stroke 10/08/2018   Small vessel disease, 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   Carotid stenosis, bilateral 10/08/2018   Thyroid nodule 04/14/2018   DNR no code (do not resuscitate) 03/20/2017   Vaginal atrophy 02/18/2017   Aortic atherosclerosis (HCC) 09/05/2016   Decreased hearing, bilateral 12/04/2015   Health maintenance examination 12/04/2015   Hypertension    ASCUS with positive high risk human papillomavirus of vagina    GERD (gastroesophageal reflux disease)    OSA on CPAP    Hypertrophy of nasal turbinates    Hyperlipidemia 03/28/2015   Allergic rhinitis 03/28/2015   Osteopenia 06/09/2013   Past Medical History:  Past Medical History:  Diagnosis Date   Age related osteoporosis 04/09/2016    Allergic rhinitis    Allergy Statins, chocolate, cabbabe   Shown on record   Arthritis Though never diagnosed   Seems to be in hands   ASCUS with 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 Holly Bodily 2010   DNR no code (do not resuscitate) 03/20/2017   GERD (gastroesophageal reflux disease)    Hearing loss    History of gallstones    Hyperlipidemia    Hypertension    Hypertrophy of nasal turbinates    Leg mass, left    in calf muscle seeing surgeon tomorrow regarding   Lymphocytosis    Menopause age 80   Multiple food allergies    chocolate and cabbage   OSA on CPAP    Osteopenia Oct. 2014   Sleep apnea    Stroke (HCC) 09/11/2021   Thyroid nodule    Vertigo    Vitamin D deficiency disease    Past Surgical History:  Past Surgical History:  Procedure Laterality Date   BREAST CYST ASPIRATION Right    negative   CARPAL TUNNEL RELEASE Right 2015   CATARACT EXTRACTION W/PHACO Left 09/30/2018   Procedure: CATARACT EXTRACTION PHACO AND INTRAOCULAR LENS PLACEMENT (IOC)  LEFT;  Surgeon: Lockie Mola, MD;  Location: [facility];  Service: Ophthalmology;  Laterality: Left;  sleep apnea   CATARACT EXTRACTION W/PHACO Right 10/28/2018   Procedure: CATARACT EXTRACTION PHACO AND INTRAOCULAR LENS PLACEMENT (IOC)  RIGHT;  Surgeon: Lockie Mola, MD;  Location: [facility];  Service: Ophthalmology;  Laterality: Right;   CHOLECYSTECTOMY  1990   COLONOSCOPY  04/25/2008   CYST EXCISION Left 2020   L popliteal/peroneal  cyst excision s/p residual numbness   DILATION AND CURETTAGE OF UTERUS  1982   s/p miscarriage   ESOPHAGOGASTRODUODENOSCOPY  08/2020   chronic gastritis, neg H pylori, no Barretts, no rpt needed [facility])   HPI:  77 year old woman who presented with aphasia and right sided weakness. She had fallen in the hallway on the way to the bathroom.  Husband reported that she was speaking gibberish. On arrival aphasia had improved but  right leg weakness persisted.  Given TNK for stroke symptoms but no LVO on CTA.  CT head negative, MRI pending.   Assessment / Plan / Recommendation Clinical Impression  Pt tested with Western Aphasia Battery Bedside- scored 10/10 in all subtests except writing due to incomplete grammar, more telegraphic than complete in description section. Pt does have occasional tremulous speech, accurate articulation and fluent, placement correct. Difficult to describe but noticed by RN. Family reports that is her baseline speech. No need for acute SLP intervention at this time.    SLP Assessment       Recommendations for follow up therapy are one component of a multi-disciplinary discharge planning process, led by the attending physician.  Recommendations may be updated based on patient status, additional functional criteria and insurance authorization.    Follow Up Recommendations       Assistance Recommended at Discharge     Functional Status Assessment    Frequency and Duration           SLP Evaluation Cognition  Overall Cognitive Status: History of cognitive impairments - at baseline       Comprehension  Auditory Comprehension Overall Auditory Comprehension: Appears within functional limits for tasks assessed Reading Comprehension Reading Status: Within funtional limits    Expression Verbal Expression Overall Verbal Expression: Appears within functional limits for tasks assessed   Oral / Motor  Oral Motor/Sensory Function Overall Oral Motor/Sensory Function: Within functional limits Motor Speech Overall Motor Speech: Appears within functional limits for tasks assessed Articulation: Within functional limitis Intelligibility: Intelligible Motor Planning: Witnin functional limits            Ned Kakar, Riley Nearing 08/26/2023, 1:38 PM

## 2023-08-26 NOTE — Progress Notes (Signed)
PHARMACIST CODE STROKE RESPONSE  Notified to mix TNK at 0617 by Dr. Erick Blinks  TNK preparation completed at 0618  TNK dose = 17 mg IV over 5 seconds  Issues/delays encountered (if applicable):  BP control - utilized hydralazine 10mg , labetalol 10mg  and cleviprex infusion TNK given at 0632  Marja Kays 08/26/23 6:14 AM

## 2023-08-27 ENCOUNTER — Inpatient Hospital Stay (HOSPITAL_COMMUNITY): Payer: PPO

## 2023-08-27 ENCOUNTER — Other Ambulatory Visit (HOSPITAL_COMMUNITY): Payer: Self-pay

## 2023-08-27 ENCOUNTER — Other Ambulatory Visit (HOSPITAL_COMMUNITY): Payer: PPO

## 2023-08-27 ENCOUNTER — Telehealth (HOSPITAL_COMMUNITY): Payer: Self-pay | Admitting: Pharmacy Technician

## 2023-08-27 DIAGNOSIS — I639 Cerebral infarction, unspecified: Secondary | ICD-10-CM

## 2023-08-27 DIAGNOSIS — R001 Bradycardia, unspecified: Secondary | ICD-10-CM

## 2023-08-27 LAB — LIPID PANEL
Cholesterol: 139 mg/dL (ref 0–200)
HDL: 74 mg/dL (ref >40)
LDL Cholesterol: 54 mg/dL (ref 0–99)
Total CHOL/HDL Ratio: 1.9 {ratio}
Triglycerides: 55 mg/dL (ref <150)
VLDL: 11 mg/dL (ref 0–40)

## 2023-08-27 MED ORDER — STUDY - OCEANIC-STROKE - ASUNDEXIAN 50 MG OR PLACEBO TABLET (PI-SETHI): 1.0000 | ORAL_TABLET | Freq: Every day | ORAL | 0 refills | Status: DC

## 2023-08-27 MED ORDER — TICAGRELOR 90 MG PO TABS
90.0000 mg | ORAL_TABLET | Freq: Two times a day (BID) | ORAL | Status: DC
  Administered 2023-08-27: 90 mg via ORAL
  Filled 2023-08-27: qty 1

## 2023-08-27 MED ORDER — TICAGRELOR 90 MG PO TABS: 90.0000 mg | ORAL_TABLET | Freq: Two times a day (BID) | ORAL | 1 refills | Status: DC

## 2023-08-27 MED ORDER — ASPIRIN 81 MG PO TBEC
81.0000 mg | DELAYED_RELEASE_TABLET | Freq: Every day | ORAL | Status: DC
  Administered 2023-08-27: 81 mg via ORAL
  Filled 2023-08-27: qty 1

## 2023-08-27 MED ORDER — ASPIRIN 81 MG PO TBEC: 81.0000 mg | DELAYED_RELEASE_TABLET | Freq: Every day | ORAL | 12 refills | Status: AC

## 2023-08-27 MED ORDER — ROSUVASTATIN CALCIUM 20 MG PO TABS: ORAL_TABLET | ORAL | 2 refills | Status: DC

## 2023-08-27 MED ORDER — ROSUVASTATIN CALCIUM 5 MG PO TABS: 10.0000 mg | ORAL_TABLET | Freq: Every day | ORAL | Status: DC

## 2023-08-27 MED ORDER — STUDY - OCEANIC-STROKE - ASUNDEXIAN 50 MG OR PLACEBO TABLET (PI-SETHI)
1.0000 | ORAL_TABLET | Freq: Every day | ORAL | Status: DC
  Administered 2023-08-27: 50 mg via ORAL
  Filled 2023-08-27: qty 1

## 2023-08-27 MED ORDER — ENOXAPARIN SODIUM 40 MG/0.4ML IJ SOSY
40.0000 mg | PREFILLED_SYRINGE | INTRAMUSCULAR | Status: DC
  Administered 2023-08-27: 40 mg via SUBCUTANEOUS
  Filled 2023-08-27: qty 0.4

## 2023-08-27 MED ORDER — ROSUVASTATIN CALCIUM 20 MG PO TABS: 20.0000 mg | ORAL_TABLET | Freq: Every day | ORAL | 1 refills | Status: DC

## 2023-08-27 NOTE — Progress Notes (Addendum)
STROKE TEAM PROGRESS NOTE   BRIEF HPI Ms. Kathy Baldwin is a 77 y.o. female with history of hypertension, hyperlipidemia, mild dementia and prior strokes with residual mild right-sided weakness, including a punctate stroke in October 2024 presenting with acute onset right-sided weakness.  Patient was noted to fall in the hallway on her way to the bathroom at home.  Her husband noted that her speech was unintelligible and she was unable to get up.  She was brought to the ED, and aphasia had improved while en route.  TNK was administered due to continuing symptoms of right leg weakness. Later in the morning of 12/17, patient had a presyncopal episode when sitting up in bed to eat her breakfast with heart rate decreasing to the 30s and systolic blood pressure decreasing to the 70s.  Fluid bolus was given and norepinephrine was used for short period of time to maintain blood pressure.  NIH on Admission 5   SIGNIFICANT HOSPITAL EVENTS 12/17-patient admitted and TNK given, presyncopal episode later in the morning  INTERIM HISTORY/SUBJECTIVE ASA and brilinta for 4 weeks and then Plavix long term if unable to afford long term brilinta and opts out of study. Strength improving today. Will need 30 day monitor at discharge through [facility].  May be able to move out of ICU later today.   OBJECTIVE  CBC    Component Value Date/Time   WBC 6.7 08/26/2023 1038   RBC 4.15 08/26/2023 1038   HGB 12.4 08/26/2023 1038   HGB 14.2 05/02/2015 0819   HCT 38.5 08/26/2023 1038   HCT 41.9 05/02/2015 0819   PLT 235 08/26/2023 1038   PLT 267 05/02/2015 0819   MCV 92.8 08/26/2023 1038   MCV 89 05/02/2015 0819   MCH 29.9 08/26/2023 1038   MCHC 32.2 08/26/2023 1038   RDW 13.9 08/26/2023 1038   RDW 13.8 05/02/2015 0819   LYMPHSABS 1.9 08/26/2023 1038   LYMPHSABS 3.6 (H) 05/02/2015 0819   MONOABS 0.5 08/26/2023 1038   EOSABS 0.1 08/26/2023 1038   EOSABS 0.2 05/02/2015 0819   BASOSABS 0.1  08/26/2023 1038   BASOSABS 0.0 05/02/2015 0819    BMET    Component Value Date/Time   NA 141 08/26/2023 0556   NA 143 12/04/2015 0851   K 3.4 (L) 08/26/2023 0556   CL 108 08/26/2023 0556   CO2 22 08/26/2023 0556   GLUCOSE 95 08/26/2023 0556   BUN 17 08/26/2023 0556   BUN 11 12/04/2015 0851   CREATININE 0.68 08/26/2023 0556   CREATININE 1.10 (H) 05/02/2023 1458   CALCIUM 9.5 08/26/2023 0556   GFRNONAA >60 08/26/2023 0556   GFRNONAA 75 10/08/2018 1156    IMAGING past 24 hours MR BRAIN WO CONTRAST Result Date: 08/26/2023 CLINICAL DATA:  Neuro deficit, stroke suspected EXAM: MRI HEAD WITHOUT CONTRAST TECHNIQUE: Multiplanar, multiecho pulse sequences of the brain and surrounding structures were obtained without intravenous contrast. COMPARISON:  06/23/2023 MRI head, correlation is also made with 08/26/2023 CT head FINDINGS: Brain: Restricted diffusion with ADC correlate in the medial left parietal cortex (series 2, images 36-45), which is associated with mildly increased T2 hyperintense signal. No acute hemorrhage, mass, mass effect, or midline shift. No hydrocephalus or extra-axial collection. Pituitary and craniocervical junction within normal limits. No hemosiderin deposition to suggest remote hemorrhage. Mildly advanced cerebral volume loss for age. Scattered and confluent T2 hyperintense signal in the periventricular white matter, likely the sequela of moderate chronic small vessel ischemic disease. Multiple remote infarcts in the bilateral  cerebellar hemispheres Vascular: Normal arterial flow voids. Skull and upper cervical spine: Normal marrow signal. Sinuses/Orbits: Clear paranasal sinuses. No acute finding in the orbits. Status post bilateral lens replacements. Other: The mastoid air cells are well aerated. IMPRESSION: Acute infarct in the medial left parietal cortex. These results will be called to the ordering clinician or representative by the Radiologist Assistant, and communication  documented in the PACS or [employer]. Electronically Signed   By: Wiliam Ke M.D.   On: 08/26/2023 15:51    Vitals:   08/27/23 0500 08/27/23 0600 08/27/23 0700 08/27/23 0800  BP: 129/68 (!) 140/64 (!) 139/54 (!) 146/57  Pulse: 61 62 (!) 56 (!) 56  Resp: 20 (!) 28 (!) 21 16  Temp:  99.1 F (37.3 C)  98.2 F (36.8 C)  TempSrc:  Oral  Oral  SpO2: 94% 92% 93% 92%  Weight:         PHYSICAL EXAM General:  Alert, well-nourished, well-developed patient in no acute distress Psych:  Mood and affect appropriate for situation CV: Regular rate and rhythm on monitor Respiratory:  Regular, unlabored respirations on room air  NEURO:  Mental Status: AA&Ox3, patient is able to give clear and coherent history.  She is able to name 7 animals with 4 feet when asked, 3 out of 3 registration, 0 out of 3 recall Speech/Language: speech is without dysarthria or aphasia.  Repetition slightly impaired but naming intact  Cranial Nerves:  II: PERRL. III, IV, VI: EOMI. Eyelids elevate symmetrically.  VII: Face is symmetrical resting and smiling VIII: hearing intact to voice. IX, X: Phonation is normal.  XII: tongue is midline without fasciculations. Motor: 5/5 strength to bilateral upper extremities and left lower extremity, drift noted in right leg- strength improving today Tone: is normal and bulk is normal Sensation- Intact to light touch bilaterally but slightly diminished on the right Coordination: FTN intact bilaterally Gait- deferred  Most Recent NIH  1a Level of Conscious.: 0 1b LOC Questions: 0 1c LOC Commands: 0 2 Best Gaze: 0 3 Visual: 0 4 Facial Palsy: 0 5a Motor Arm - left: 0 5b Motor Arm - Right: 0 6a Motor Leg - Left: 0 6b Motor Leg - Right: 1 7 Limb Ataxia: 0 8 Sensory: 0 9 Best Language: 0 10 Dysarthria: 0 11 Extinct. and Inatten.: 0 TOTAL:1  Premorbid modified Rankin score 1  ASSESSMENT/PLAN  Acute Ischemic Infarct:  Acute infarct in the medial left parietal  cortex s/p TNK Etiology: suspect cardio embolic  Code Stroke CT head No acute abnormality. ASPECTS 10.    CTA head & neck small peripheral left ACA branch cutoff, atherosclerosis without flow-limiting stenosis in major arteries of head and neck MRI - Acute infarct in the medial left parietal cortex.  Repeat CT Head - personally reviewed  Carotid Doppler pending 2D Echo 60-65% LDL 56 HgbA1c 5.7 VTE prophylaxis -SCDs aspirin 81 mg daily and clopidogrel 75 mg daily prior to admission, now on ASA 81mg  and Brilinta Therapy recommendations:  Pending Disposition: Pending  Presyncopal episode Patient had presyncopal episode while sitting up in bed, heart rate decreased to 30s, systolic blood pressure decreased to 70s IV fluid bolus given and norepinephrine used for short time Given calcification seen in intracranial arteries, it is difficult to tell if there is much stenosis present which could cause strokelike symptoms with presyncopal episodes, carotid ultrasound Discharge with recommendations for her to follow up with her cardiologist for a 30 day monitor or loop recorder. Follows with [facility]   Hx of Stroke/TIA Patient has a history of a stroke some years ago with residual mild right-sided weakness She did have a punctate stroke in October 2024 however, due to small size, this was not a contraindication for TNK  Hypertension Home meds: Amlodipine 5 mg daily Stable Blood Pressure Goal: BP less than 180/105   Hyperlipidemia Home meds: Rosuvastatin 20 mg daily, reduced to 10 mg daily due to concerns of interaction with [facility] stroke trial  study medication and LDL is at goal  LDL 56, goal < 70 Continue statin at discharge  Other Stroke Risk Factors Advanced age   Other Active Problems Mild dementia-continue home Namenda and Aricept and follow-up in clinic with me to consider evaluation for possible Lequembi -antiamyloid therapy  Hospital day # 1  Patient seen and  examined by NP/APP with MD. MD to update note as needed.   Elmer Picker, DNP, FNP-BC [facility] Pager: [phone]  I have personally obtained history,examined this patient, reviewed notes, independently viewed imaging studies, participated in medical decision making and plan of care.ROS completed by me personally and pertinent positives fully documented  I have made any additions or clarifications directly to the above note. Agree with note above.  Patient is doing well with improvement in right leg strength.  She was able to ambulate without assistance.  24-hour CT imaging shows stable appearance of the infarct without any hemorrhage.  Patient is interested in participating in the [facility] stroke prevention study and has signed consent.  Continue aspirin and Brilinta for 4 weeks and then aspirin and Plavix after that along with oceanic stroke trial medication.  Follow-up as an outpatient in stroke research clinic as scheduled.  Follow-up with me in neurology clinic for further evaluation and treatment for her memory concerns.  Long discussion with patient and husband at the bedside and answered questions.  Delia Heady, MD Medical Director [facility] Pager: [phone] 08/27/2023 2:40 PM   To contact Stroke Continuity provider, please refer to [email or web address]. After hours, contact General Neurology

## 2023-08-27 NOTE — TOC Transition Note (Signed)
Transition of Care [facility]) - Discharge Note   Patient Details  Name: Kathy Baldwin MRN: [id number] Date of Birth: [birth year]  Transition of Care [facility]) CM/SW Contact:  Glennon Mac, RN Phone Number: 08/27/2023, 3:40 PM   Clinical Narrative:    [name] is a 77 yo female who presented with aphasia and R sided weakness. TNK given. 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 PMHx: arthritis, HLD, osteopenia, sleep apnea, CVA, vertigo  PTA, pt independent and living at home with spouse.  Husband and daughter able to provide needed assistance at discharge.  PT recommending outpatient therapy; will refer to [facility] for follow up.  Patient started on Brilinta this admission; 30 day free trial card provided to patient.    Final next level of care: OP Rehab Barriers to Discharge: Barriers Resolved                                  Discharge Plan and Services Additional resources added to the After Visit Summary for     Discharge Planning Services: CM Consult                                 Social Drivers of Health (SDOH) Interventions SDOH Screenings   Food Insecurity: No Food Insecurity (06/27/2023)  Housing: Low Risk  (06/23/2023)  Transportation Needs: No Transportation Needs (06/27/2023)  Utilities: Not At Risk (06/23/2023)  Alcohol Screen: Low Risk  (03/13/2022)  Depression (PHQ2-9): Low Risk  (06/02/2023)  Financial Resource Strain: Low Risk  (05/01/2023)  Physical Activity: Inactive (05/01/2023)  Social Connections: Moderately Isolated (05/01/2023)  Stress: No Stress Concern Present (05/01/2023)  Tobacco Use: Low Risk  (07/24/2023)  Health Literacy: Adequate Health Literacy (05/01/2023)     Readmission Risk Interventions     No data to display         Quintella Baton, RN, BSN  Trauma/Neuro ICU Case Manager [phone]

## 2023-08-27 NOTE — Progress Notes (Signed)
OCEANIC STROKE RESEARCH STUDY NOTE   Patient is participating in  [facility] Stroke   prevention study ( standard of care antiplatelet therapy plus Asundexian-factor 11 inhibitor versus standard of care antiplatelet therapy plus placebo ).  Patient was given study material to review and informed consent form  0n 08/26/23 at 4 pm.  Patient was advised to take as much time as she wanted to review the consent form and encouraged to ask questions which were answered.  Patient's daughter and husband were also present at the bedside and were also encouraged to review the material and ask questions.  It was made clear to the patient that study participation is voluntary and patient will still get the same excellent standard of care irrespective of whether she chooses to participate in the study or not.  The benefits of participation in the study as well as the risk involved including but not limited to bleeding as well as alternatives to not participating in the study were clearly discussed with the patient and family who expressed understanding.  The patient was clearly informed that patient has no obligation to's stay in the study for the entire duration and is free to discontinue study medication or study participation if she is dissatisfied at any time in the future.  The research study office visit scheduled was explained to the patient and study obligations were clearly explained as well.  Patient voiced understanding and willingness to participate in the study.  The study inclusion exclusion criteria reviewed by me personally as well as study coordinator and verified that patient met all criteria.  Patient signed informed consent with her husband at the bedside in my presence at 1 20 pm on 08/27/23.  The hospital research pharmacy was notified in advance about potential patient participation and after patient randomization and patient received first dose of medication before discharge and she was discharged home with  the study medication bottle and instructed to call [facility] research office with any questions.  No study specific procedure was done prior to patient signing informed consent form.  A copy of informed consent form and patient Annette Stable of Rights signed by the patient was given to her.  Delia Heady, MD

## 2023-08-27 NOTE — Progress Notes (Signed)
NAME:  Kathy Baldwin, MRN:  [id number], DOB:  [birth year], LOS: 1 ADMISSION DATE:  08/26/2023, CONSULTATION DATE:  08/26/2023 REFERRING MD:  Pearlean Brownie - Stroke, CHIEF COMPLAINT:  bradycardia.   History of Present Illness:  77 year old woman who presented with aphasia and right sided weakness. She had fallen in the hallway on the way to the bathroom.  Husband reported that she was speaking gibberish. On arrival aphasia had improved but right leg weakness persisted.  Given TNK for stroke symptoms but no LVO on CTA.   This morning became lightheaded and when she was sat up to eat. Denies chest pain, SOB or nausea. Denies abdominal pain. BP dropped to 30s and SBP dropped to 70's.   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 Holly Bodily 2010   DNR no code (do not resuscitate) 03/20/2017   GERD (gastroesophageal reflux disease)    Hearing loss    History of gallstones    Hyperlipidemia    Hypertension    Hypertrophy of nasal turbinates    Leg mass, left    in calf muscle seeing surgeon tomorrow regarding   Lymphocytosis    Menopause age 61   Multiple food allergies    chocolate and cabbage   OSA on CPAP    Osteopenia Oct. 2014   Sleep apnea    Stroke (HCC) 09/11/2021   Thyroid nodule    Vertigo    Vitamin D deficiency disease    Past Surgical History:  Procedure Laterality Date   BREAST CYST ASPIRATION Right    negative   CARPAL TUNNEL RELEASE Right 2015   CATARACT EXTRACTION W/PHACO Left 09/30/2018   Procedure: CATARACT EXTRACTION PHACO AND INTRAOCULAR LENS PLACEMENT (IOC)  LEFT;  Surgeon: Lockie Mola, MD;  Location: [facility];  Service: Ophthalmology;  Laterality: Left;  sleep apnea    CATARACT EXTRACTION W/PHACO Right 10/28/2018   Procedure: CATARACT EXTRACTION PHACO AND INTRAOCULAR LENS PLACEMENT (IOC)  RIGHT;  Surgeon: Lockie Mola, MD;  Location: [facility];  Service: Ophthalmology;  Laterality: Right;   CHOLECYSTECTOMY  1990   COLONOSCOPY  04/25/2008   CYST EXCISION Left 2020   L popliteal/peroneal cyst excision s/p residual numbness   DILATION AND CURETTAGE OF UTERUS  1982   s/p miscarriage   ESOPHAGOGASTRODUODENOSCOPY  08/2020   chronic gastritis, neg H pylori, no Barretts, no rpt needed [facility])   No current facility-administered medications on file prior to encounter.   Current Outpatient Medications on File Prior to Encounter  Medication Sig Dispense Refill   amLODipine (NORVASC) 5 MG tablet Take 0.5 tablets (2.5 mg total) by mouth daily. 90 tablet 3   aspirin EC 81 MG EC tablet Take 1 tablet (81 mg total) by mouth daily. Swallow whole. 21 tablet 0   buPROPion ER (WELLBUTRIN SR) 100 MG 12 hr tablet Take 1 tablet (100 mg total) by mouth daily. 90 tablet 4   Calcium Carb-Cholecalciferol (CALCIUM 600+D) 600-800 MG-UNIT TABS Take by mouth daily. 2 capsule daily     clopidogrel (PLAVIX) 75 MG tablet Take 1 tablet (75 mg total) by mouth daily. 90 tablet 0   COLLAGEN PO Take by mouth.  Cyanocobalamin (B-12) 2500 MCG TABS Take 1 tablet by mouth daily. Pt taking 1/2 of a 5000 mcg tablet     donepezil (ARICEPT) 10 MG tablet Take 10 mg by mouth at bedtime.      famotidine (PEPCID) 20 MG tablet Take 1 tablet (20 mg total) by mouth 2 (two) times daily. For heartburn/indigestion 180 tablet 4   fluticasone (FLONASE) 50 MCG/ACT nasal spray instill TWO SPRAYS in each nostril daily (shake gently) 48 g 3   fluticasone-salmeterol (WIXELA INHUB) 250-50 MCG/ACT AEPB Inhale 1 puff into the lungs in the morning and at bedtime. 60 each 11   gabapentin (NEURONTIN) 100 MG capsule Take 1 capsule (100 mg total) by mouth in the morning.     memantine (NAMENDA) 10 MG  tablet Take 10 mg by mouth 2 (two) times daily.     mirabegron ER (MYRBETRIQ) 25 MG TB24 tablet Take 1 tablet (25 mg total) by mouth daily. 90 tablet 3   Multiple Vitamin (MULTIVITAMIN) tablet Take 1 tablet by mouth daily.     pantoprazole (PROTONIX) 40 MG tablet Take 40 mg by mouth daily.     meclizine (ANTIVERT) 25 MG tablet Take 1 tablet (25 mg total) by mouth 3 (three) times daily as needed for dizziness. (Patient not taking: Reported on 08/26/2023) 30 tablet 0   Significant Hospital Events: Including procedures, antibiotic start and stop dates in addition to other pertinent events   12/17 - received TNK for stroke syndrome.   Interim History / Subjective:  HR stable in 50s - 60s. She is asymptomatic. Per chart review, baseline appears to be in 60s. BP improved with SBP 140 range.  Objective   Blood pressure (!) 146/57, pulse (!) 56, temperature 99.1 F (37.3 C), temperature source Oral, resp. rate 16, weight 67.8 kg, SpO2 92%.        Intake/Output Summary (Last 24 hours) at 08/27/2023 0811 Last data filed at 08/27/2023 0600 Gross per 24 hour  Intake 1014.64 ml  Output 1500 ml  Net -485.36 ml   Filed Weights   08/26/23 0500 08/26/23 0642  Weight: 67.8 kg 67.8 kg   Examination: General: adult female, appears stated age. No distress. HENT: sclerae normal. Oral mucosa moist.  Lungs: clear  Cardiovascular: Brady, regular, no M/R/G Abdomen: soft and non-distended.  Extremities: No edema or deformities Neuro: A&O x 3, no deficits  Assessment & Plan:   Presyncopal episode consistent with neurocardiogenic syncope - resolved Possible stroke syndrome due to hypoperfusion through stenosed intracranial vessels.  HTN Dyslipidemia.   - BP and HR stable this AM. - Avoid AV nodal blockers or sedating meds. - Continue care per stroke team.  Nothing further to add from our standpoint.  PCCM will sign off.  Please call us back if we can be of any further assistance.   Bines Guys, PA - [facility] For pager details, please see AMION or use Epic chat  After 1900, please call [employer] for cross coverage needs 08/27/2023, 8:15 AM

## 2023-08-27 NOTE — Telephone Encounter (Signed)
ERx rosuvastatin 20mg 

## 2023-08-27 NOTE — Telephone Encounter (Signed)
Patient Product/process development scientist completed.    The patient is insured through [employer]/ [employer]. Patient has Medicare and is not eligible for a copay card, but may be able to apply for patient assistance, if available.    Ran test claim for Brilinta 90 mg and the current 30 day co-pay is $108.22.   This test claim was processed through [facility]- copay amounts may vary at other pharmacies due to pharmacy/plan contracts, or as the patient moves through the different stages of their insurance plan.     Roland Earl, CPHT Pharmacy Technician III Certified Patient Advocate [facility] Patient Advocate Team Direct Number: [phone]  Fax: [phone]

## 2023-08-27 NOTE — Evaluation (Signed)
Occupational Therapy Evaluation Patient Details Name: Kathy Baldwin MRN: [id number] DOB: [birth year] Today's Date: 08/27/2023   History of Present Illness Kathy Baldwin is a 77 yo female who presented with aphasia and R sided weakness. TNK given. 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 PMHx: arthritis, HLD, osteopenia, sleep apnea, CVA, vertigo   Clinical Impression   Kathy Baldwin was evaluated s/p the above admission list. She is indep and lives with her husband at baseline. Upon evaluation the pt was limited by RLE weakness, impaired balance and decreased activity tolerance. Overall she needed CGA-superivsion A for all aspects of mobility with RW. Due to the deficits listed below the pt also needs generalized superivsion A for ADLs with cues for compensatory techniques and safety. Pt will benefit from continued acute OT services and discharge home with family.        If plan is discharge home, recommend the following: A little help with walking and/or transfers;A little help with bathing/dressing/bathroom;Assistance with cooking/housework;Direct supervision/assist for medications management;Direct supervision/assist for financial management;Assist for transportation;Help with stairs or ramp for entrance    Functional Status Assessment  Patient has had a recent decline in their functional status and demonstrates the ability to make significant improvements in function in a reasonable and predictable amount of time.  Equipment Recommendations  None recommended by OT       Precautions / Restrictions Precautions Precautions: Fall Restrictions Weight Bearing Restrictions Per Provider Order: No      Mobility Bed Mobility Overal bed mobility: Needs Assistance Bed Mobility: Supine to Sit, Sit to Supine     Supine to sit: Supervision Sit to supine: Supervision        Transfers Overall transfer level: Needs assistance Equipment used: Rolling walker (2  wheels) Transfers: Sit to/from Stand Sit to Stand: Contact guard assist, Supervision           General transfer comment: CGA for initial transfer, pt quickly progressed to superivsion A with RW      Balance Overall balance assessment: Needs assistance Sitting-balance support: Feet supported Sitting balance-Leahy Scale: Good     Standing balance support: Single extremity supported, During functional activity Standing balance-Leahy Scale: Fair Standing balance comment: statically at the sink                           ADL either performed or assessed with clinical judgement   ADL Overall ADL's : Needs assistance/impaired Eating/Feeding: Independent   Grooming: Supervision/safety;Standing   Upper Body Bathing: Set up;Sitting   Lower Body Bathing: Supervison/ safety;Sit to/from stand   Upper Body Dressing : Set up;Sitting   Lower Body Dressing: Supervision/safety;Sit to/from stand Lower Body Dressing Details (indicate cue type and reason): cue to don R leg first Toilet Transfer: Supervision/safety;Ambulation;Regular Toilet;Rolling walker (2 wheels)   Toileting- Clothing Manipulation and Hygiene: Supervision/safety;Sitting/lateral lean;Sit to/from stand       Functional mobility during ADLs: Supervision/safety;Rolling walker (2 wheels) General ADL Comments: overall she did not need physical assist for ADLs, cues for safety and compensatory techniques provided. RW used for all mobility.     Vision Baseline Vision/History: 1 Wears glasses Vision Assessment?: No apparent visual deficits     Perception Perception: Within Functional Limits       Praxis Praxis: WFL       Pertinent Vitals/Pain Pain Assessment Pain Assessment: No/denies pain     Extremity/Trunk Assessment Upper Extremity Assessment Upper Extremity Assessment: Right hand dominant;Overall [facility] for tasks  assessed;RUE deficits/detail RUE Deficits / Details: overall WFL, no residual deficits  observed RUE Sensation: WNL RUE Coordination: WNL   Lower Extremity Assessment Lower Extremity Assessment: Defer to PT evaluation   Cervical / Trunk Assessment Cervical / Trunk Assessment: Normal   Communication Communication Communication: No apparent difficulties   Cognition Arousal: Alert Behavior During Therapy: WFL for tasks assessed/performed Overall Cognitive Status: History of cognitive impairments - at baseline                                       General Comments  BP slightly elevated but within the parameters            Home Living Family/patient expects to be discharged to:: Private residence Living Arrangements: Spouse/significant other Available Help at Discharge: Family;Available 24 hours/day Type of Home: House Home Access: Level entry     Home Layout: Two level;1/2 bath on main level;Bed/bath upstairs Alternate Level Stairs-Number of Steps: flight Alternate Level Stairs-Rails: Right Bathroom Shower/Tub: Chief Strategy Officer: Standard Bathroom Accessibility: Yes How Accessible: Accessible via walker Home Equipment: Rolling Walker (2 wheels);BSC/3in1;Grab bars - tub/shower      Lives With: Spouse    Prior Functioning/Environment Prior Level of Function : Driving;Independent/Modified Independent             Mobility Comments: Indep no AD, denies  falls ADLs Comments: indep, drives        OT Problem List: Decreased strength;Decreased range of motion;Decreased activity tolerance;Impaired balance (sitting and/or standing)      OT Treatment/Interventions: Self-care/ADL training;Therapeutic exercise;DME and/or AE instruction;Therapeutic activities;Patient/family education;Balance training    OT Goals(Current goals can be found in the care plan section) Acute Rehab OT Goals Patient Stated Goal: home OT Goal Formulation: With patient Time For Goal Achievement: 09/10/23 Potential to Achieve Goals: Good ADL  Goals Additional ADL Goal #1: Pt will complete all ADLs with mod I Additional ADL Goal #2: pt will indep recall at least 3 fall prevention strategies to apply to increase safety at home  OT Frequency: Min 1X/week       AM-PAC OT "6 Clicks" Daily Activity     Outcome Measure Help from another person eating meals?: None Help from another person taking care of personal grooming?: A Little Help from another person toileting, which includes using toliet, bedpan, or urinal?: A Little Help from another person bathing (including washing, rinsing, drying)?: A Little Help from another person to put on and taking off regular upper body clothing?: A Little Help from another person to put on and taking off regular lower body clothing?: A Little 6 Click Score: 19   End of Session Equipment Utilized During Treatment: Rolling walker (2 wheels);Gait belt Nurse Communication: Mobility status  Activity Tolerance: Patient tolerated treatment well Patient left: in bed;with call bell/phone within reach;with family/visitor present  OT Visit Diagnosis: Unsteadiness on feet (R26.81);Other abnormalities of gait and mobility (R26.89);Muscle weakness (generalized) (M62.81);Hemiplegia and hemiparesis Hemiplegia - Right/Left: Right Hemiplegia - dominant/non-dominant: Dominant Hemiplegia - caused by: Cerebral infarction                Time: 6962-9528 OT Time Calculation (min): 29 min Charges:  OT General Charges $OT Visit: 1 Visit OT Evaluation $OT Eval Moderate Complexity: 1 Mod OT Treatments $Self Care/Home Management : 8-22 mins  Derenda Mis, OTR/L Acute Rehabilitation Services Office [phone] Secure Chat Communication Preferred   Donia Pounds 08/27/2023,  10:28 AM

## 2023-08-27 NOTE — Discharge Summary (Cosign Needed)
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 Etiology: suspect cardio embolic    Patient Active Problem List   Diagnosis Date Noted   Stroke determined by clinical assessment (HCC) 08/26/2023   Abnormal thyroid function test 07/05/2023   Acute cerebrovascular accident (CVA) due to ischemia (HCC) 06/24/2023   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   Wrist fracture, closed, left, sequela 06/24/2023   Fall with injury 06/02/2023   Right wrist injury, subsequent encounter 06/02/2023   Renal insufficiency 05/09/2023   Dysuria 02/09/2023   MDD (major depressive disorder), single episode, 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/2023   Peroneal nerve palsy, 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   Loose stools 04/18/2022   Asthma 11/09/2021   Exertional dyspnea 11/06/2021   Other fatigue 09/26/2021   History of stroke 09/11/2021   Bradycardia 04/27/2021   Urge urinary incontinence 03/06/2021   Medicare annual wellness visit, subsequent 03/05/2021   Advanced directives, counseling/discussion 03/05/2021   Seborrheic keratosis 12/13/2020   Mild dementia (HCC) 11/19/2018   Mass of leg, left 10/23/2018   History of cerebellar stroke 10/08/2018   Small vessel disease, 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   Carotid stenosis, bilateral 10/08/2018   Thyroid nodule 04/14/2018   DNR no code (do not resuscitate) 03/20/2017   Vaginal atrophy 02/18/2017   Aortic atherosclerosis (HCC) 09/05/2016   Decreased hearing, bilateral 12/04/2015   Health maintenance examination 12/04/2015   Hypertension    ASCUS with positive high risk human papillomavirus of vagina    GERD (gastroesophageal reflux disease)    OSA on CPAP    Hypertrophy of nasal  turbinates    Hyperlipidemia 03/28/2015   Allergic rhinitis 03/28/2015   Osteopenia 06/09/2013   Allergies as of 08/27/2023       Reactions   Cabbage Other (See Comments)   Hypersensitivity on allergy testing   Chocolate Other (See Comments)   Hypersensitivity on allergy testing   Statins Other (See Comments)   Zocor and Pravastatin--leg cramps Other reaction(s): Other (See Comments) Zocor and Pravastatin--leg cramps        Medication List     TAKE these medications    amLODipine 5 MG tablet Commonly known as: NORVASC Take 0.5 tablets (2.5 mg total) by mouth daily.   aspirin EC 81 MG tablet Take 1 tablet (81 mg total) by mouth daily. Swallow whole. Start taking on: August 28, 2023   B-12 2500 MCG Tabs Take 1 tablet by mouth daily. Pt taking 1/2 of a 5000 mcg tablet   buPROPion ER 100 MG 12 hr tablet Commonly known as: WELLBUTRIN SR Take 1 tablet (100 mg total) by mouth daily.   Calcium 600+D 600-800 MG-UNIT Tabs Generic drug: Calcium Carb-Cholecalciferol Take by mouth daily. 2 capsule daily   clopidogrel 75 MG tablet Commonly known as: PLAVIX Take 1 tablet (75 mg total) by mouth daily.   COLLAGEN PO Take by mouth.   donepezil 10 MG tablet Commonly known as: ARICEPT Take 10 mg by mouth at bedtime.   famotidine 20 MG tablet Commonly known as: PEPCID Take 1 tablet (20 mg total) by mouth 2 (two) times daily. For heartburn/indigestion   fluticasone 50 MCG/ACT nasal spray Commonly known as: FLONASE instill TWO SPRAYS  in each nostril daily (shake gently)   fluticasone-salmeterol 250-50 MCG/ACT Aepb Commonly known as: Wixela Inhub Inhale 1 puff into the lungs in the morning and at bedtime.   gabapentin 100 MG capsule Commonly known as: NEURONTIN Take 1 capsule (100 mg total) by mouth in the morning.   meclizine 25 MG tablet Commonly known as: ANTIVERT Take 1 tablet (25 mg total) by mouth 3 (three) times daily as needed for dizziness.   memantine 10  MG tablet Commonly known as: NAMENDA Take 10 mg by mouth 2 (two) times daily.   mirabegron ER 25 MG Tb24 tablet Commonly known as: Myrbetriq Take 1 tablet (25 mg total) by mouth daily.   multivitamin tablet Take 1 tablet by mouth daily.   OCEANIC-STROKE asundexian or placebo 50 mg tablet Take 1 tablet (50 mg total) by mouth daily.   pantoprazole 40 MG tablet Commonly known as: PROTONIX Take 40 mg by mouth daily.   rosuvastatin 20 MG tablet Commonly known as: CRESTOR Take 1 tablet (20 mg total) by mouth daily. Start taking on: August 28, 2023 What changed:  how much to take how to take this when to take this additional instructions   ticagrelor 90 MG Tabs tablet Commonly known as: BRILINTA Take 1 tablet (90 mg total) by mouth 2 (two) times daily.        LABORATORY STUDIES CBC    Component Value Date/Time   WBC 6.7 08/26/2023 1038   RBC 4.15 08/26/2023 1038   HGB 12.4 08/26/2023 1038   HGB 14.2 05/02/2015 0819   HCT 38.5 08/26/2023 1038   HCT 41.9 05/02/2015 0819   PLT 235 08/26/2023 1038   PLT 267 05/02/2015 0819   MCV 92.8 08/26/2023 1038   MCV 89 05/02/2015 0819   MCH 29.9 08/26/2023 1038   MCHC 32.2 08/26/2023 1038   RDW 13.9 08/26/2023 1038   RDW 13.8 05/02/2015 0819   LYMPHSABS 1.9 08/26/2023 1038   LYMPHSABS 3.6 (H) 05/02/2015 0819   MONOABS 0.5 08/26/2023 1038   EOSABS 0.1 08/26/2023 1038   EOSABS 0.2 05/02/2015 0819   BASOSABS 0.1 08/26/2023 1038   BASOSABS 0.0 05/02/2015 0819   CMP    Component Value Date/Time   NA 141 08/26/2023 0556   NA 143 12/04/2015 0851   K 3.4 (L) 08/26/2023 0556   CL 108 08/26/2023 0556   CO2 22 08/26/2023 0556   GLUCOSE 95 08/26/2023 0556   BUN 17 08/26/2023 0556   BUN 11 12/04/2015 0851   CREATININE 0.68 08/26/2023 0556   CREATININE 1.10 (H) 05/02/2023 1458   CALCIUM 9.5 08/26/2023 0556   PROT 6.1 (L) 08/26/2023 0556   PROT 6.5 12/04/2015 0851   ALBUMIN 3.5 08/26/2023 0556   ALBUMIN 4.3 12/04/2015 0851    AST 36 08/26/2023 0556   ALT 20 08/26/2023 0556   ALKPHOS 73 08/26/2023 0556   BILITOT 1.1 08/26/2023 0556   BILITOT 0.9 12/04/2015 0851   GFRNONAA >60 08/26/2023 0556   GFRNONAA 75 10/08/2018 1156   GFRAA 87 10/08/2018 1156   COAGS Lab Results  Component Value Date   INR 1.1 08/26/2023   INR 1.0 08/26/2023   INR 1.1 09/11/2021   Lipid Panel    Component Value Date/Time   CHOL 139 08/27/2023 0552   CHOL 171 12/04/2015 0851   TRIG 55 08/27/2023 0552   HDL 74 08/27/2023 0552   HDL 72 12/04/2015 0851   CHOLHDL 1.9 08/27/2023 0552   VLDL 11 08/27/2023 0552   LDLCALC 54  08/27/2023 0552   LDLCALC 68 05/02/2023 1458   HgbA1C  Lab Results  Component Value Date   HGBA1C 5.7 (H) 06/24/2023   Alcohol Level    Component Value Date/Time   ETH <10 08/26/2023 0556     SIGNIFICANT DIAGNOSTIC STUDIES MR BRAIN WO CONTRAST Result Date: 08/26/2023 CLINICAL DATA:  Neuro deficit, stroke suspected EXAM: MRI HEAD WITHOUT CONTRAST TECHNIQUE: Multiplanar, multiecho pulse sequences of the brain and surrounding structures were obtained without intravenous contrast. COMPARISON:  06/23/2023 MRI head, correlation is also made with 08/26/2023 CT head FINDINGS: Brain: Restricted diffusion with ADC correlate in the medial left parietal cortex (series 2, images 36-45), which is associated with mildly increased T2 hyperintense signal. No acute hemorrhage, mass, mass effect, or midline shift. No hydrocephalus or extra-axial collection. Pituitary and craniocervical junction within normal limits. No hemosiderin deposition to suggest remote hemorrhage. Mildly advanced cerebral volume loss for age. Scattered and confluent T2 hyperintense signal in the periventricular white matter, likely the sequela of moderate chronic small vessel ischemic disease. Multiple remote infarcts in the bilateral cerebellar hemispheres Vascular: Normal arterial flow voids. Skull and upper cervical spine: Normal marrow signal.  Sinuses/Orbits: Clear paranasal sinuses. No acute finding in the orbits. Status post bilateral lens replacements. Other: The mastoid air cells are well aerated. IMPRESSION: Acute infarct in the medial left parietal cortex. These results will be called to the ordering clinician or representative by the Radiologist Assistant, and communication documented in the PACS or [employer]. Electronically Signed   By: Wiliam Ke M.D.   On: 08/26/2023 15:51   CT ANGIO HEAD NECK W WO CM (CODE STROKE) Addendum Date: 08/26/2023 ADDENDUM REPORT: 08/26/2023 06:36 ADDENDUM: History of persisting leg weakness and the ACA branches were reinspected, a small peripheral branch cut off is suspected on the left. No large vessel occlusion. Electronically Signed   By: Tiburcio Pea M.D.   On: 08/26/2023 06:36   Result Date: 08/26/2023 CLINICAL DATA:  Slurred speech and right-sided deficits EXAM: CT ANGIOGRAPHY HEAD AND NECK WITH AND WITHOUT CONTRAST TECHNIQUE: Multidetector CT imaging of the head and neck was performed using the standard protocol during bolus administration of intravenous contrast. Multiplanar CT image reconstructions and MIPs were obtained to evaluate the vascular anatomy. Carotid stenosis measurements (when applicable) are obtained utilizing NASCET criteria, using the distal internal carotid diameter as the denominator. RADIATION DOSE REDUCTION: This exam was performed according to the departmental dose-optimization program which includes automated exposure control, adjustment of the mA and/or kV according to patient size and/or use of iterative reconstruction technique. CONTRAST:  75mL OMNIPAQUE IOHEXOL 350 MG/ML SOLN COMPARISON:  06/23/2023 FINDINGS: CTA NECK FINDINGS Aortic arch: Atheromatous calcification with 3 vessel branching. Right carotid system: Atheromatous calcification primarily at the bifurcation, no flow reducing stenosis or ulceration. Left carotid system: Mild atheromatous plaque at the  bifurcation. No stenosis or ulceration. Vertebral arteries: No proximal subclavian stenosis. The left vertebral artery is dominant. No vertebral stenosis, beading, or dissection. Skeleton: Multilevel cervical spine degeneration. Other neck: No acute finding Upper chest: No acute finding Review of the MIP images confirms the above findings CTA HEAD FINDINGS Anterior circulation: Atheromatous calcification of the cavernous carotids. No flow reducing stenosis. The right ICA is larger than the left due to circle-of-Willis variation. The left A1 segment is hypoplastic or aplastic. No branch occlusion, beading, or aneurysm. Posterior circulation: Left dominant vertebral artery. The vertebral and basilar arteries are smoothly contoured and diffusely patent. No branch occlusion, beading, or aneurysm. Venous sinuses: Unremarkable Anatomic  variants: None significant Review of the MIP images confirms the above findings IMPRESSION: 1. No emergent finding. 2. Atherosclerosis without flow reducing stenosis or irregularity of major arteries in the head and neck. Electronically Signed: By: Tiburcio Pea M.D. On: 08/26/2023 06:14   CT HEAD CODE STROKE WO CONTRAST Result Date: 08/26/2023 CLINICAL DATA:  Code stroke. Slurred speech and right-sided deficits. EXAM: CT HEAD WITHOUT CONTRAST TECHNIQUE: Contiguous axial images were obtained from the base of the skull through the vertex without intravenous contrast. RADIATION DOSE REDUCTION: This exam was performed according to the departmental dose-optimization program which includes automated exposure control, adjustment of the mA and/or kV according to patient size and/or use of iterative reconstruction technique. COMPARISON:  06/23/2023 FINDINGS: Brain: No evidence of acute infarction, hemorrhage, hydrocephalus, extra-axial collection or mass lesion/mass effect. Small chronic bilateral cerebellar infarcts, non progressed. Streak artifact affects the brainstem without detected  infarct. Generalized cortical atrophy. Generalized low-density in the cerebral white matter. Vascular: No hyperdense vessel or unexpected calcification. Skull: Normal. Negative for fracture or focal lesion. Sinuses/Orbits: No acute finding. Other: Prelim sent in epic chat. ASPECTS [facility] Stroke Program Early CT Score) - Ganglionic level infarction (caudate, lentiform nuclei, internal capsule, insula, M1-M3 cortex): 7 - Supraganglionic infarction (M4-M6 cortex): 3 Total score (0-10 with 10 being normal): 10 IMPRESSION: 1. No acute finding. 2. Chronic small vessel disease and small cerebellar infarcts. Electronically Signed   By: Tiburcio Pea M.D.   On: 08/26/2023 06:04       HISTORY OF PRESENT ILLNESS 77 y.o. female with history of hypertension, hyperlipidemia, mild dementia and prior strokes with residual mild right-sided weakness, including a punctate stroke in October 2024 presenting with acute onset right-sided weakness.  Patient was noted to fall in the hallway on her way to the bathroom at home.  Her husband noted that her speech was unintelligible and she was unable to get up.  She was brought to the ED, and aphasia had improved while en route.  TNK was administered due to continuing symptoms of right leg weakness. Later in the morning of 12/17, patient had a presyncopal episode when sitting up in bed to eat her breakfast with heart rate decreasing to the 30s and systolic blood pressure decreasing to the 70s.  Fluid bolus was given and norepinephrine was used for short period of time to maintain blood pressure.  HOSPITAL COURSE Acute Ischemic Infarct:  Acute infarct in the medial left parietal cortex s/p TNK Etiology: suspect cryptogenic  embolic versus intracranial atherosclerosis Code Stroke CT head No acute abnormality. ASPECTS 10.    CTA head & neck small peripheral left ACA branch cutoff, atherosclerosis without flow-limiting stenosis in major arteries of head and neck MRI - Acute infarct  in the medial left parietal cortex.  Repeat CT Head - No acute hemorrhage on personal review 2D Echo 60-65% LDL 56 HgbA1c 5.7 VTE prophylaxis -SCDs aspirin 81 mg daily and clopidogrel 75 mg daily prior to admission, now on ASA 81mg  and Brilinta with oceanic study medication Therapy recommendations:  Outpatient PT Disposition: Home   Presyncopal episode Patient had presyncopal episode while sitting up in bed, heart rate decreased to 30s, systolic blood pressure decreased to 70s IV fluid bolus given and norepinephrine used for short time Given calcification seen in intracranial arteries, it is difficult to tell if there is much stenosis present which could cause strokelike symptoms with presyncopal episodes, carotid ultrasound Discharge with recommendations for her to follow up with her cardiologist for a 30 day  monitor or loop recorder. Follows with [facility]    Hx of Stroke/TIA Patient has a history of a stroke some years ago with residual mild right-sided weakness She did have a punctate stroke in October 2024 however, due to small size, this was not a contraindication for TNK   Hypertension Home meds: Amlodipine 5 mg daily Stable Blood Pressure Goal: BP less than 180/105    Hyperlipidemia Home meds: Rosuvastatin 20 mg daily, resumed in hospital LDL 56, goal < 70 Continue statin at discharge   Other Stroke Risk Factors Advanced age   Other Active Problems Mild dementia-continue home Namenda and Aricept   DISCHARGE EXAM  PHYSICAL EXAM General:  Alert, well-nourished, well-developed patient in no acute distress Psych:  Mood and affect appropriate for situation CV: Regular rate and rhythm on monitor Respiratory:  Regular, unlabored respirations on room air   NEURO:  Mental Status: AA&Ox3, patient is able to give clear and coherent history.  She is able to name 7 animals with 4 feet when asked, 3 out of 3 registration, 0 out of 3 recall Speech/Language: speech is  without dysarthria or aphasia.  Repetition slightly impaired but naming intact   Cranial Nerves:  II: PERRL. III, IV, VI: EOMI. Eyelids elevate symmetrically.  VII: Face is symmetrical resting and smiling VIII: hearing intact to voice. IX, X: Phonation is normal.  XII: tongue is midline without fasciculations. Motor: 5/5 strength to bilateral upper extremities and left lower extremity, drift noted in right leg- strength improving today Tone: is normal and bulk is normal Sensation- Intact to light touch bilaterally but slightly diminished on the right Coordination: FTN intact bilaterally Gait- deferred   Most Recent NIH  1a Level of Conscious.: 0 1b LOC Questions: 0 1c LOC Commands: 0 2 Best Gaze: 0 3 Visual: 0 4 Facial Palsy: 0 5a Motor Arm - left: 0 5b Motor Arm - Right: 0 6a Motor Leg - Left: 0 6b Motor Leg - Right: 1 7 Limb Ataxia: 0 8 Sensory: 0 9 Best Language: 0 10 Dysarthria: 0 11 Extinct. and Inatten.: 0 TOTAL: 1     Discharge Diet       Diet   Diet regular Room service appropriate? Yes with Assist; Fluid consistency: Thin   liquids  DISCHARGE PLAN Disposition: Home aspirin 81 mg daily and Brilinta (ticagrelor) 90 mg bid for secondary stroke prevention with oceanic stroke study medication  Ongoing stroke risk factor control by Primary Care Physician at time of discharge Follow-up PCP [name], MD in 2 weeks. Follow up with [facility] 08/28/2023 @ 1130 .Recommend outpatient 30 day heart monitor for PAF versus heart blocks Follow-up in [facility] in 8 weeks, office to schedule an appointment. Follow up in [facility] stroke research clinic as per [facility] Stroke study protocol  33 minutes were spent preparing discharge.  Patient seen and examined by NP/APP with MD. MD to update note as needed.   Elmer Picker, DNP, FNP-BC [facility] Pager: [phone]  I have personally obtained history,examined this  patient, reviewed notes, independently viewed imaging studies, participated in medical decision making and plan of care.ROS completed by me personally and pertinent positives fully documented  I have made any additions or clarifications directly to the above note. Agree with note above.    Delia Heady, MD Medical Director [facility] Pager: [phone] 08/28/2023 10:36 AM

## 2023-08-27 NOTE — Evaluation (Signed)
Physical Therapy Evaluation Patient Details Name: Kathy Baldwin MRN: [id number] DOB: [birth year] Today's Date: 08/27/2023  History of Present Illness  Kathy Baldwin is a 77 yo female who presented with aphasia and R sided weakness. TNK given. 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 PMHx: arthritis, HLD, osteopenia, sleep apnea, CVA, vertigo  Clinical Impression  Patient presents with decreased mobility due to R LE incoordination and mild weakness.  Previously independent at home and able to manage ADL's/IADL's.  Currently needing CGA for hallway ambulation with RW and notes she has second level bedroom at home.  May go to daughter's home initially with no stairs.  Feel she will benefit from continued skilled PT and follow up outpatient PT at d/c.       If plan is discharge home, recommend the following: Help with stairs or ramp for entrance;A little help with bathing/dressing/bathroom;Assistance with cooking/housework;Assist for transportation   Can travel by private vehicle        Equipment Recommendations None recommended by PT  Recommendations for Other Services       Functional Status Assessment Patient has had a recent decline in their functional status and demonstrates the ability to make significant improvements in function in a reasonable and predictable amount of time.     Precautions / Restrictions Precautions Precautions: Fall Restrictions Weight Bearing Restrictions Per Provider Order: No      Mobility  Bed Mobility Overal bed mobility: Needs Assistance       Supine to sit: Supervision Sit to supine: Supervision   General bed mobility comments: assist for lines, safety; increased time lifting R leg into bed    Transfers Overall transfer level: Needs assistance Equipment used: Rolling walker (2 wheels) Transfers: Sit to/from Stand Sit to Stand: Contact guard assist, Supervision           General transfer comment: assist  for lines    Ambulation/Gait Ambulation/Gait assistance: Supervision, Contact guard assist Gait Distance (Feet): 200 Feet Assistive device: Rolling walker (2 wheels) Gait Pattern/deviations: Step-through pattern, Decreased stride length, Decreased dorsiflexion - right       General Gait Details: cues for walker proximity, for R foot dorsiflexion/heel strike  Stairs Stairs:  (discussed doing stairs, though pt reports may d/c to daughter's home initially so did not practice today)          Wheelchair Mobility     Tilt Bed    Modified Rankin (Stroke Patients Only) Modified Rankin (Stroke Patients Only) Pre-Morbid Rankin Score: No significant disability Modified Rankin: Moderately severe disability     Balance Overall balance assessment: Needs assistance Sitting-balance support: Feet supported Sitting balance-Leahy Scale: Good     Standing balance support: No upper extremity supported, During functional activity Standing balance-Leahy Scale: Fair Standing balance comment: brushing teeth at sink, two hands to apply toothpaste to toothbrush                             Pertinent Vitals/Pain Pain Assessment Pain Assessment: No/denies pain    Home Living Family/patient expects to be discharged to:: Private residence Living Arrangements: Spouse/significant other Available Help at Discharge: Family;Available 24 hours/day Type of Home: House Home Access: Level entry     Alternate Level Stairs-Number of Steps: flight Home Layout: Two level;1/2 bath on main level;Bed/bath upstairs Home Equipment: Rolling Walker (2 wheels);BSC/3in1;Grab bars - tub/shower Additional Comments: 1/2 bath in basement; full bath on main level.  Pt's daughter lives close  by and has 1 level home with level entry where she went briefly after last episode    Prior Function Prior Level of Function : Driving;Independent/Modified Independent             Mobility Comments: Indep no  AD, denies  falls ADLs Comments: indep, drives     Extremity/Trunk Assessment   Upper Extremity Assessment Upper Extremity Assessment: Defer to OT evaluation RUE Deficits / Details: overall WFL, no residual deficits observed RUE Sensation: WNL RUE Coordination: WNL    Lower Extremity Assessment Lower Extremity Assessment: RLE deficits/detail RLE Deficits / Details: overall strength grossly 4 to 4+/5 though pt reports most symptoms on the R; seems more coordination deficits RLE Sensation: WNL RLE Coordination: decreased gross motor    Cervical / Trunk Assessment Cervical / Trunk Assessment: Normal  Communication   Communication Communication: No apparent difficulties  Cognition Arousal: Alert Behavior During Therapy: WFL for tasks assessed/performed Overall Cognitive Status: History of cognitive impairments - at baseline                                          General Comments General comments (skin integrity, edema, etc.): SBP 147 in sitting EOB, 138 after ambulation; denies symptoms of dizziness    Exercises     Assessment/Plan    PT Assessment Patient needs continued PT services  PT Problem List Decreased strength;Decreased mobility;Decreased balance;Decreased coordination       PT Treatment Interventions DME instruction;Therapeutic activities;Gait training;Stair training;Functional mobility training;Therapeutic exercise;Balance training;Patient/family education    PT Goals (Current goals can be found in the Care Plan section)  Acute Rehab PT Goals Patient Stated Goal: return to independent PT Goal Formulation: With patient/family Time For Goal Achievement: 09/10/23 Potential to Achieve Goals: Good    Frequency Min 1X/week     Co-evaluation               AM-PAC PT "6 Clicks" Mobility  Outcome Measure Help needed turning from your back to your side while in a flat bed without using bedrails?: None Help needed moving from lying on  your back to sitting on the side of a flat bed without using bedrails?: None Help needed moving to and from a bed to a chair (including a wheelchair)?: A Little Help needed standing up from a chair using your arms (e.g., wheelchair or bedside chair)?: A Little Help needed to walk in hospital room?: A Little Help needed climbing 3-5 steps with a railing? : Total 6 Click Score: 18    End of Session Equipment Utilized During Treatment: Gait belt Activity Tolerance: Patient tolerated treatment well Patient left: in bed;with call bell/phone within reach;with family/visitor present   PT Visit Diagnosis: Other abnormalities of gait and mobility (R26.89)    Time: 1610-9604 PT Time Calculation (min) (ACUTE ONLY): 25 min   Charges:   PT Evaluation $PT Eval Moderate Complexity: 1 Mod PT Treatments $Gait Training: 8-22 mins PT General Charges $$ ACUTE PT VISIT: 1 Visit         Sheran Lawless, PT Acute Rehabilitation Services [phone] 08/27/2023   Elray Mcgregor 08/27/2023, 10:55 AM

## 2023-08-27 NOTE — Progress Notes (Signed)
[facility] New Study Start: OCEANIC-STROKE   SUMMARY For more information refer to: [email or web address] Study Identifier: [id number]    A Multicenter, International, Randomized, Placebo Controlled, Double-blind, Parallel Group and Event Driven Phase 3 Study of the Oral FXIa Inhibitor Asundexian ([id number]) for the Prevention of Ischemic Stroke in Female and Female Participants Aged 79 Years and Older After an Acute Non-cardioembolic Ischemic Stroke or High-risk TIA  Brief Summary This study will randomize adult patients with an initial diagnosis of acute non-cardioembolic ischemic stroke or high-risk transient ischemic attack (TIA) to treatment within 72 hours of symptom onset and with the intention to be treated with antiplatelet therapy.  Design Phase 3, Randomized, Interventional, Event-Driven  Intervention Asundexian ([id number]) 50 mg tablets or matching placebo tablets (pink, oval, film-coated tablets  Concomitant Therapy Participants are to receive antiplatelet therapy (single or dual; provider discretion) during the study conduct.  Prohibited Therapy Oral anticoagulation Full dose and/or long-term anticoagulation therapy with heparin or LMWH  Chronic (more than 4 weeks continuous) therapy with NSAIDs during the study conduct Concomitant use of combined P-gp and strong/moderate CYP3A4 inducers Concomitant use of combined P-gp and strong CYP3A4 inhibitors Herbal/traditional medications and/or supplements with known anticoagulant/antiplatelet effects  Anticoagulation Prophylaxis Venous thromboembolism prophylaxis with LMWH or unfractionated heparin for short periods of time (2 weeks) is allowed.  Potential Drug-Drug Interactions Rosuvastatin 20 mg daily or higher (additional monitoring may be warranted due to increased concentrations of rosuvastatin) Concha [place] ([id number]) is a weak inhibitor of CYP2C8)  Administration  Can be taken irrespective of food  intake. Should be swallowed whole with water, preferably in the morning. CANNOT be crushed or broken.      Plan: Start Concha [place] [id number]) 50 mg tablets or placebo] today. Study medication must be picked up from pharmacy, medication can not be tubed.    Please contact [facility] if any questions or concerns regarding the study medication.    Caryl Asp, PharmD, BCPS [facility] Pharmacist  [phone]

## 2023-08-28 ENCOUNTER — Telehealth: Payer: Self-pay

## 2023-08-28 ENCOUNTER — Other Ambulatory Visit: Payer: Self-pay | Admitting: Neurology

## 2023-08-28 NOTE — Transitions of Care (Post Inpatient/ED Visit) (Signed)
08/28/2023  Name: Kathy Baldwin MRN: [id number] DOB: [birth year]  Today's TOC FU Call Status: Today's TOC FU Call Status:: Successful TOC FU Call Completed TOC FU Call Complete Date: 08/28/23 Patient's Name and Date of Birth confirmed.  Transition Care Management Follow-up Telephone Call Date of Discharge: 08/27/23 Discharge Facility: [facility] [place]) Type of Discharge: Inpatient Admission Primary Inpatient Discharge Diagnosis:: CVA How have you been since you were released from the hospital?: Better Any questions or concerns?: No  Items Reviewed: Did you receive and understand the discharge instructions provided?: Yes Medications obtained,verified, and reconciled?: Yes (Medications Reviewed) Any new allergies since your discharge?: No Dietary orders reviewed?: Yes Type of Diet Ordered:: low sodium heart healthy Do you have support at home?: Yes People in Home: spouse Name of Support/Comfort Primary Source: [name]  Medications Reviewed Today: Medications Reviewed Today     Reviewed by Redge Gainer, RN (Case Manager) on 08/28/23 at 1003  Med List Status: <None>   Medication Order Taking? Sig Documenting Provider Last Dose Status Informant  amLODipine (NORVASC) 5 MG tablet [id number] No Take 0.5 tablets (2.5 mg total) by mouth daily. [name], MD 08/25/2023 Active Self, Spouse/Significant Other  aspirin EC 81 MG tablet [id number]  Take 1 tablet (81 mg total) by mouth daily. Swallow whole. Elmer Picker, NP  Active   buPROPion ER [facility] SR) 100 MG 12 hr tablet [id number] No Take 1 tablet (100 mg total) by mouth daily. [name], MD 08/25/2023 Active Self, Spouse/Significant Other  Calcium Carb-Cholecalciferol (CALCIUM 600+D) 600-800 MG-UNIT TABS [id number] No Take by mouth daily. 2 capsule daily [provider] 08/25/2023 Active Self, Spouse/Significant Other  clopidogrel (PLAVIX) 75 MG tablet [id number] No Take 1 tablet (75 mg total) by mouth  daily. Alford Highland, MD 08/25/2023  9:00 AM Active Self, Spouse/Significant Other  COLLAGEN PO [id number] No Take by mouth. [provider] Past Month Active Self, Spouse/Significant Other  Cyanocobalamin (B-12) 2500 MCG TABS [id number] No Take 1 tablet by mouth daily. Pt taking 1/2 of a 5000 mcg tablet [provider] 08/25/2023 Active Self, Spouse/Significant Other  donepezil (ARICEPT) 10 MG tablet [id number] No Take 10 mg by mouth at bedtime.  [provider] 08/25/2023 Active Self, Spouse/Significant Other  famotidine (PEPCID) 20 MG tablet [id number] No Take 1 tablet (20 mg total) by mouth 2 (two) times daily. For heartburn/indigestion [name], MD 08/25/2023 Active Self, Spouse/Significant Other  fluticasone (FLONASE) 50 MCG/ACT nasal spray [id number] No instill TWO SPRAYS in each nostril daily (shake gently) [name], MD 08/25/2023 Active Self, Spouse/Significant Other  fluticasone-salmeterol (WIXELA INHUB) 250-50 MCG/ACT AEPB [id number] No Inhale 1 puff into the lungs in the morning and at bedtime. [name], MD 08/25/2023 Active Self, Spouse/Significant Other  gabapentin (NEURONTIN) 100 MG capsule [id number] No Take 1 capsule (100 mg total) by mouth in the morning. [name], MD 08/25/2023 Active Self, Spouse/Significant Other  meclizine (ANTIVERT) 25 MG tablet [id number] No Take 1 tablet (25 mg total) by mouth 3 (three) times daily as needed for dizziness.  Patient not taking: Reported on 08/26/2023   [name], MD Not Taking Active Self, Spouse/Significant Other  memantine (NAMENDA) 10 MG tablet [id number] No Take 10 mg by mouth 2 (two) times daily. [provider] 08/25/2023 Active Self, Spouse/Significant Other  mirabegron ER (MYRBETRIQ) 25 MG TB24 tablet [id number] No Take 1 tablet (25 mg total) by mouth daily. [name], MD 08/25/2023 Active Self, Spouse/Significant Other  Multiple Vitamin (MULTIVITAMIN) tablet  [id number] No Take 1 tablet by  mouth daily. [provider] 08/25/2023 Active Self, Spouse/Significant Other  pantoprazole (PROTONIX) 40 MG tablet [id number] No Take 40 mg by mouth daily. [name], MD 08/25/2023 Active Self, Spouse/Significant Other  rosuvastatin (CRESTOR) 20 MG tablet [id number]  Take 1 tablet (20 mg total) by mouth daily. Elmer Picker, NP  Active   Study - OCEANIC-STROKE - asundexian 50 mg or placebo tablet (PI-Sethi) [id number]  Take 1 tablet (50 mg total) by mouth daily. Elmer Picker, NP  Active   ticagrelor (BRILINTA) 90 MG TABS tablet [id number]  Take 1 tablet (90 mg total) by mouth 2 (two) times daily. Elmer Picker, NP  Active             Home Care and Equipment/Supplies: Were Home Health Services Ordered?: NA Any new equipment or medical supplies ordered?: NA  Functional Questionnaire: Do you need assistance with bathing/showering or dressing?: Yes Do you need assistance with meal preparation?: Yes Do you need assistance with eating?: No Do you have difficulty maintaining continence: No Do you need assistance with getting out of bed/getting out of a chair/moving?: Yes Do you have difficulty managing or taking your medications?: Yes  Follow up appointments reviewed: PCP Follow-up appointment confirmed?: No MD Provider Line [phone] Given: Yes Specialist Hospital Follow-up appointment confirmed?: Yes Date of Specialist follow-up appointment?: 08/28/23 Follow-Up Specialty Provider:: Cardiologist Do you need transportation to your follow-up appointment?: No Do you understand care options if your condition(s) worsen?: Yes-patient verbalized understanding  SDOH Interventions Today    Flowsheet Row Most Recent Value  SDOH Interventions   Food Insecurity Interventions Intervention Not Indicated  Housing Interventions Intervention Not Indicated  Transportation Interventions Intervention Not Indicated  Utilities Interventions  Intervention Not Indicated      TOC Outreach to the patient today. The patient is weak from her hosptialization and needs assistance with showering and dressing. The patient's spouse answers the phone and talks for her due to some dementia. She is going to see a specialist today regarding her recent CVA. Per the spouse the emboli are so tiny that it is hard to see them on a scan. She is currently on Plavix and ASA but also in a controlled study for a new medication. The spouse states he does not need to schedule a follow up appointment with PCP at this time and will just follow up with cardiologist specialist. The plan is for Outpatient PT. Declines 30 day enrollment.  The patient has been provided with contact information for the care management team and has been advised to call with any health-related questions or concerns. The patient verbalized understanding with current POC. The patient is directed to their insurance card regarding availability of benefits coverage.    Deidre Ala, RN Medical illustrator [facility] [phone]

## 2023-09-11 DIAGNOSIS — G4733 Obstructive sleep apnea (adult) (pediatric): Secondary | ICD-10-CM | POA: Diagnosis not present

## 2023-09-11 DIAGNOSIS — I639 Cerebral infarction, unspecified: Secondary | ICD-10-CM | POA: Diagnosis not present

## 2023-09-11 DIAGNOSIS — G301 Alzheimer's disease with late onset: Secondary | ICD-10-CM | POA: Diagnosis not present

## 2023-09-11 DIAGNOSIS — R2689 Other abnormalities of gait and mobility: Secondary | ICD-10-CM | POA: Diagnosis not present

## 2023-09-11 DIAGNOSIS — F02A3 Dementia in other diseases classified elsewhere, mild, with mood disturbance: Secondary | ICD-10-CM | POA: Diagnosis not present

## 2023-09-11 DIAGNOSIS — R531 Weakness: Secondary | ICD-10-CM | POA: Diagnosis not present

## 2023-09-23 DIAGNOSIS — G4733 Obstructive sleep apnea (adult) (pediatric): Secondary | ICD-10-CM | POA: Diagnosis not present

## 2023-09-29 DIAGNOSIS — R002 Palpitations: Secondary | ICD-10-CM | POA: Diagnosis not present

## 2023-10-03 ENCOUNTER — Ambulatory Visit (INDEPENDENT_AMBULATORY_CARE_PROVIDER_SITE_OTHER): Payer: PPO | Admitting: Family Medicine

## 2023-10-03 ENCOUNTER — Encounter: Payer: Self-pay | Admitting: Family Medicine

## 2023-10-03 VITALS — BP 160/70 | HR 65 | Temp 98.4°F | Ht 62.0 in | Wt 143.4 lb

## 2023-10-03 DIAGNOSIS — I10 Essential (primary) hypertension: Secondary | ICD-10-CM | POA: Diagnosis not present

## 2023-10-03 DIAGNOSIS — F320 Major depressive disorder, single episode, mild: Secondary | ICD-10-CM

## 2023-10-03 DIAGNOSIS — I69351 Hemiplegia and hemiparesis following cerebral infarction affecting right dominant side: Secondary | ICD-10-CM | POA: Insufficient documentation

## 2023-10-03 DIAGNOSIS — E782 Mixed hyperlipidemia: Secondary | ICD-10-CM

## 2023-10-03 DIAGNOSIS — F03A3 Unspecified dementia, mild, with mood disturbance: Secondary | ICD-10-CM

## 2023-10-03 DIAGNOSIS — Z8673 Personal history of transient ischemic attack (TIA), and cerebral infarction without residual deficits: Secondary | ICD-10-CM | POA: Diagnosis not present

## 2023-10-03 NOTE — Patient Instructions (Addendum)
Continue current medicines We will refer you to outpatient physical therapy BP was too high - check when you get home and send me readings as well as what dose of amlodipine you're taking. Continue monitoring at home- BP log sheet provided - and send me some more readings in 1-2 weeks

## 2023-10-03 NOTE — Progress Notes (Unsigned)
Ph: [phone] Fax: [phone]   Patient ID: Kathy Baldwin, female    DOB: 03-05-1946, 78 y.o.   MRN: [id number]  This visit was conducted in person.  BP (!) 160/70   Pulse 65   Temp 98.4 F (36.9 C) (Oral)   Ht 5\' 2"  (1.575 m)   Wt 143 lb 6 oz (65 kg)   SpO2 97%   BMI 26.22 kg/m   BP Readings from Last 3 Encounters:  10/03/23 (!) 160/70  08/27/23 (!) 156/70  07/24/23 (!) 159/78  Elevated on retesting   CC: hosp f/u visit  Subjective:   HPI: Kathy Baldwin is a 78 y.o. female presenting on 10/03/2023 for Hospitalization Follow-up (Admitted on 08/26/23 at [facility], dx CVA. Pt accompanied by husband, [name].)   Recent hospitalization for acute R sided weakness noted after fall in hallway, as well as unintelligible speech. 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 She had presyncopal event while in hospital bed, with bradycardia to 30s and SBP down to 70s, treated with IV fluid bolus and NE for short time.  Hospital records reviewed. Med rec performed.  Prior on aspirin 81mg  and plavix, added brilinta 90mg  BID. Crestor 20mg  daily continued.  Rec cardiology f/u for 30d event monitor vs loop recorder.  Inscribed into [facility] Stroke investigational study.   Notes residual R sided leg weakness and would like to undergo physical therapy Kathy Baldwin). She is not home-bound.  She has 12 steps from basement to main floor - she is struggling with this.  She is no longer driving.  Appetite ok.  No constipation, no bowel or bladder incontinence.   Home health not set up.  Other follow up appointments scheduled: saw [facility] cardiology Dr Melton Alar 08/28/2023 and neurology 09/11/2023 in follow up. Just completed 30 day event monitor pending results. Planning to establish with Dr Sherryll Burger 10/2023.  ______________________________________________________________________ Hospital admission:  08/26/2023 Hospital discharge: 08/27/2023 TCM f/u phone call: not performed   DISCHARGE PRIMARY DIAGNOSIS: Acute Ischemic Infarct:  Acute infarct in the medial left parietal cortex s/p TNK Etiology: suspect cardio embolic    DISCHARGE PLAN Disposition: Home aspirin 81 mg daily and Brilinta (ticagrelor) 90 mg bid for secondary stroke prevention with oceanic stroke study medication  Ongoing stroke risk factor control by Primary Care Physician at time of discharge Follow-up PCP [name], MD in 2 weeks. Follow up with [facility] Cardiology 08/28/2023 @ 1130 .Recommend outpatient 30 day heart monitor for PAF versus heart blocks Follow-up in [facility] in 8 weeks, office to schedule an appointment. Follow up in [facility] stroke research clinic as per [facility] Stroke study protocol     Relevant past medical, surgical, family and social history reviewed and updated as indicated. Interim medical history since our last visit reviewed. Allergies and medications reviewed and updated. Outpatient Medications Prior to Visit  Medication Sig Dispense Refill   aspirin EC 81 MG tablet Take 1 tablet (81 mg total) by mouth daily. Swallow whole. 30 tablet 12   buPROPion ER (WELLBUTRIN SR) 100 MG 12 hr tablet Take 1 tablet (100 mg total) by mouth daily. 90 tablet 4   Calcium Carb-Cholecalciferol (CALCIUM 600+D) 600-800 MG-UNIT TABS Take by mouth daily. 2 capsule daily     clopidogrel (PLAVIX) 75 MG tablet Take 1 tablet (75 mg total) by mouth daily. 90 tablet 0   COLLAGEN PO Take by mouth.     Cyanocobalamin (B-12)  2500 MCG TABS Take 1 tablet by mouth daily. Pt taking 1/2 of a 5000 mcg tablet     donepezil (ARICEPT) 10 MG tablet Take 10 mg by mouth at bedtime.      famotidine (PEPCID) 20 MG tablet Take 1 tablet (20 mg total) by mouth 2 (two) times daily. For heartburn/indigestion 180 tablet 4   fluticasone (FLONASE) 50 MCG/ACT nasal spray instill TWO SPRAYS in each nostril daily  (shake gently) 48 g 3   fluticasone-salmeterol (WIXELA INHUB) 250-50 MCG/ACT AEPB Inhale 1 puff into the lungs in the morning and at bedtime. 60 each 11   gabapentin (NEURONTIN) 100 MG capsule Take 1 capsule (100 mg total) by mouth in the morning.     meclizine (ANTIVERT) 25 MG tablet Take 1 tablet (25 mg total) by mouth 3 (three) times daily as needed for dizziness. 30 tablet 0   memantine (NAMENDA) 10 MG tablet Take 10 mg by mouth 2 (two) times daily.     mirabegron ER (MYRBETRIQ) 25 MG TB24 tablet Take 1 tablet (25 mg total) by mouth daily. 90 tablet 3   Multiple Vitamin (MULTIVITAMIN) tablet Take 1 tablet by mouth daily.     pantoprazole (PROTONIX) 40 MG tablet Take 40 mg by mouth daily.     rosuvastatin (CRESTOR) 20 MG tablet Take 1 tablet (20 mg total) by mouth daily. 30 tablet 1   Study - OCEANIC-STROKE - asundexian 50 mg or placebo tablet (PI-Sethi) Take 1 tablet (50 mg total) by mouth daily. 30 tablet 0   ticagrelor (BRILINTA) 90 MG TABS tablet Take 1 tablet (90 mg total) by mouth 2 (two) times daily. 60 tablet 1   amLODipine (NORVASC) 5 MG tablet Take 0.5 tablets (2.5 mg total) by mouth daily. 90 tablet 3   No facility-administered medications prior to visit.     Per HPI unless specifically indicated in ROS section below Review of Systems  Objective:  BP (!) 160/70   Pulse 65   Temp 98.4 F (36.9 C) (Oral)   Ht 5\' 2"  (1.575 m)   Wt 143 lb 6 oz (65 kg)   SpO2 97%   BMI 26.22 kg/m   Wt Readings from Last 3 Encounters:  10/03/23 143 lb 6 oz (65 kg)  08/26/23 149 lb 7.6 oz (67.8 kg)  07/24/23 145 lb 3.2 oz (65.9 kg)      Physical Exam Vitals and nursing note reviewed.  Constitutional:      Appearance: Normal appearance. She is not ill-appearing.  HENT:     Head: Normocephalic and atraumatic.     Mouth/Throat:     Mouth: Mucous membranes are moist.     Pharynx: Oropharynx is clear. No oropharyngeal exudate or posterior oropharyngeal erythema.  Eyes:     Extraocular  Movements: Extraocular movements intact.     Conjunctiva/sclera: Conjunctivae normal.     Pupils: Pupils are equal, round, and reactive to light.  Cardiovascular:     Rate and Rhythm: Normal rate and regular rhythm.     Pulses: Normal pulses.     Heart sounds: Normal heart sounds. No murmur heard. Pulmonary:     Effort: Pulmonary effort is normal. No respiratory distress.     Breath sounds: Normal breath sounds. No wheezing, rhonchi or rales.  Musculoskeletal:     Right lower leg: No edema.     Left lower leg: No edema.  Skin:    General: Skin is warm and dry.     Findings: No rash.  Neurological:  Mental Status: She is alert.     Sensory: Sensation is intact.     Motor: Weakness present.     Coordination: Coordination is intact.     Gait: Gait is intact.     Comments:  4/5 strength to L knee flexor/extensors (chronic) 4/5 strength to R hip flexor (new)  5/5 BLE strength testing otherwise  Psychiatric:        Mood and Affect: Mood normal.        Behavior: Behavior normal.       Lab Results  Component Value Date   NA 141 08/26/2023   CL 108 08/26/2023   K 3.4 (L) 08/26/2023   CO2 22 08/26/2023   BUN 17 08/26/2023   CREATININE 0.68 08/26/2023   GFRNONAA >60 08/26/2023   CALCIUM 9.5 08/26/2023   PHOS 3.6 09/12/2021   ALBUMIN 3.5 08/26/2023   GLUCOSE 95 08/26/2023    Lab Results  Component Value Date   CHOL 139 08/27/2023   HDL 74 08/27/2023   LDLCALC 54 08/27/2023   TRIG 55 08/27/2023   CHOLHDL 1.9 08/27/2023    Lab Results  Component Value Date   WBC 6.7 08/26/2023   HGB 12.4 08/26/2023   HCT 38.5 08/26/2023   MCV 92.8 08/26/2023   PLT 235 08/26/2023   Assessment & Plan:   Problem List Items Addressed This Visit     Hyperlipidemia   Continue crestor 20mg  daily with LDL 54.       Relevant Medications   amLODipine (NORVASC) 5 MG tablet   Hypertension   Chronic on amlodipine they think on 5mg  dose - initial permissive hypertension however BP  remaining markedly elevated. They will check BP at home and let me know what dose amlodipine she's on to titrate accordingly.   ADDENDUM ==> she was taking amlodipine 2.5mg  daily - they increased dose to 5mg  and over weekend BPs better at at 120s/60s. 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  Continues donepezil and namenda, saw [facility] neurology - not candidate for any further dementia treatment.      History of stroke   MDD (major depressive disorder), single episode, mild (HCC)   Continue wellbutrin SR 100mg  daily.       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 Brilinta was added. Continue Crestor 20mg  daily with latest LDL 54 at goal .  Ongoing R leg weakness  after latest stroke.  Will refer to outpatient PT  BP remaining elevated - see above.       Relevant Orders   Ambulatory referral to Physical Therapy     Meds ordered this encounter  Medications   amLODipine (NORVASC) 5 MG tablet    Sig: Take 1 tablet (5 mg total) by mouth daily.    Dispense:  90 tablet    Refill:  3    Orders Placed This Encounter  Procedures   Ambulatory referral to Physical Therapy    Referral Priority:   Routine    Referral Type:   Physical Medicine    Referral Reason:   Specialty Services Required    Requested Specialty:   Physical Therapy    Number of Visits Requested:   1    Patient Instructions  Continue current medicines We will refer you  to outpatient physical therapy BP was too high - check when you get home and send me readings as well as what dose of amlodipine you're taking. Continue monitoring at home- BP log sheet provided - and send me some more readings in 1-2 weeks   Follow up plan: No follow-ups on file.  [name], MD

## 2023-10-04 NOTE — Assessment & Plan Note (Signed)
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  Continues donepezil and namenda, saw [facility] neurology - not candidate for any further dementia treatment.

## 2023-10-04 NOTE — Assessment & Plan Note (Signed)
Continue crestor 20mg  daily with LDL 54.

## 2023-10-04 NOTE — Assessment & Plan Note (Addendum)
Recent recurrent stroke despite aspirin and plavix. Brilinta was added. Continue Crestor 20mg  daily with latest LDL 54 at goal .  Ongoing R leg weakness  after latest stroke.  Will refer to outpatient PT  BP remaining elevated - see above.

## 2023-10-04 NOTE — Assessment & Plan Note (Signed)
Chronic on amlodipine they think on 5mg  dose - initial permissive hypertension however BP remaining markedly elevated. They will check BP at home and let me know what dose amlodipine she's on to titrate accordingly.   ADDENDUM ==> she was taking amlodipine 2.5mg  daily - they increased dose to 5mg  and over weekend BPs better at at 120s/60s. Will continue 5mg  dose

## 2023-10-04 NOTE — Assessment & Plan Note (Addendum)
Continue wellbutrin SR 100mg  daily.

## 2023-10-06 MED ORDER — AMLODIPINE BESYLATE 5 MG PO TABS: 5.0000 mg | ORAL_TABLET | Freq: Every day | ORAL | 3 refills | Status: DC

## 2023-10-06 NOTE — Addendum Note (Signed)
Addended by: [name] on: 10/06/2023 02:10 PM   Modules accepted: Orders

## 2023-10-08 ENCOUNTER — Encounter: Payer: Self-pay | Admitting: Family Medicine

## 2023-10-23 DIAGNOSIS — I10 Essential (primary) hypertension: Secondary | ICD-10-CM | POA: Diagnosis not present

## 2023-10-23 DIAGNOSIS — R0602 Shortness of breath: Secondary | ICD-10-CM | POA: Diagnosis not present

## 2023-10-23 DIAGNOSIS — E782 Mixed hyperlipidemia: Secondary | ICD-10-CM | POA: Diagnosis not present

## 2023-10-23 DIAGNOSIS — Z8673 Personal history of transient ischemic attack (TIA), and cerebral infarction without residual deficits: Secondary | ICD-10-CM | POA: Diagnosis not present

## 2023-10-24 DIAGNOSIS — M6281 Muscle weakness (generalized): Secondary | ICD-10-CM | POA: Diagnosis not present

## 2023-10-24 DIAGNOSIS — G4733 Obstructive sleep apnea (adult) (pediatric): Secondary | ICD-10-CM | POA: Diagnosis not present

## 2023-10-27 DIAGNOSIS — R413 Other amnesia: Secondary | ICD-10-CM | POA: Diagnosis not present

## 2023-10-27 DIAGNOSIS — I639 Cerebral infarction, unspecified: Secondary | ICD-10-CM | POA: Diagnosis not present

## 2023-10-27 DIAGNOSIS — G4733 Obstructive sleep apnea (adult) (pediatric): Secondary | ICD-10-CM | POA: Diagnosis not present

## 2023-10-28 DIAGNOSIS — M6281 Muscle weakness (generalized): Secondary | ICD-10-CM | POA: Diagnosis not present

## 2023-10-31 DIAGNOSIS — M6281 Muscle weakness (generalized): Secondary | ICD-10-CM | POA: Diagnosis not present

## 2023-11-04 DIAGNOSIS — M6281 Muscle weakness (generalized): Secondary | ICD-10-CM | POA: Diagnosis not present

## 2023-11-07 DIAGNOSIS — M6281 Muscle weakness (generalized): Secondary | ICD-10-CM | POA: Diagnosis not present

## 2023-11-11 DIAGNOSIS — M6281 Muscle weakness (generalized): Secondary | ICD-10-CM | POA: Diagnosis not present

## 2023-11-14 DIAGNOSIS — M6281 Muscle weakness (generalized): Secondary | ICD-10-CM | POA: Diagnosis not present

## 2023-11-18 DIAGNOSIS — M6281 Muscle weakness (generalized): Secondary | ICD-10-CM | POA: Diagnosis not present

## 2023-11-20 DIAGNOSIS — M6281 Muscle weakness (generalized): Secondary | ICD-10-CM | POA: Diagnosis not present

## 2023-11-21 DIAGNOSIS — G4733 Obstructive sleep apnea (adult) (pediatric): Secondary | ICD-10-CM | POA: Diagnosis not present

## 2023-12-04 ENCOUNTER — Telehealth: Payer: Self-pay

## 2023-12-04 NOTE — Patient Outreach (Signed)
 Telephone outreach to patient to obtain mRS was successfully completed. MRS= 2  Vanice [facility] Management Assistant  Direct Dial: [phone]  Fax: [phone] Website: [email or web address]

## 2023-12-22 DIAGNOSIS — G4733 Obstructive sleep apnea (adult) (pediatric): Secondary | ICD-10-CM | POA: Diagnosis not present

## 2024-01-20 DIAGNOSIS — H903 Sensorineural hearing loss, bilateral: Secondary | ICD-10-CM | POA: Diagnosis not present

## 2024-01-21 ENCOUNTER — Encounter (INDEPENDENT_AMBULATORY_CARE_PROVIDER_SITE_OTHER): Payer: Self-pay | Admitting: Nurse Practitioner

## 2024-01-21 ENCOUNTER — Ambulatory Visit (INDEPENDENT_AMBULATORY_CARE_PROVIDER_SITE_OTHER): Payer: PPO

## 2024-01-21 ENCOUNTER — Ambulatory Visit (INDEPENDENT_AMBULATORY_CARE_PROVIDER_SITE_OTHER): Payer: PPO | Admitting: Nurse Practitioner

## 2024-01-21 VITALS — BP 156/69 | HR 62 | Resp 16 | Wt 138.0 lb

## 2024-01-21 DIAGNOSIS — I639 Cerebral infarction, unspecified: Secondary | ICD-10-CM

## 2024-01-21 DIAGNOSIS — I10 Essential (primary) hypertension: Secondary | ICD-10-CM | POA: Diagnosis not present

## 2024-01-21 DIAGNOSIS — I6523 Occlusion and stenosis of bilateral carotid arteries: Secondary | ICD-10-CM

## 2024-01-21 DIAGNOSIS — E782 Mixed hyperlipidemia: Secondary | ICD-10-CM | POA: Diagnosis not present

## 2024-01-21 NOTE — Progress Notes (Signed)
 Subjective:    Patient ID: Kathy Baldwin, female    DOB: 11-Dec-1945, 78 y.o.   MRN: [id number] Chief Complaint  Patient presents with   Follow-up    6 month carotid follow up    The patient is seen for follow up evaluation of carotid stenosis. She presented to [facility] in October 2024  for generalized weakness as well as left arm and left leg weakness.  Since her last visit, she also had a stroke in December 2024 but this was with right sided weakness.    The patient denies interval amaurosis fugax. There is no recent history of TIA symptoms or focal motor deficits since DC from [facility]. There is a documented CVA.   The patient is taking enteric-coated aspirin  81 mg daily and Plavix  75 mg daily.  She was placed on Brilinta  for short time.   There is no history of migraine headaches. There is no history of seizures.   No recent shortening of the patient's walking distance or new symptoms consistent with claudication.  No history of rest pain symptoms. No new ulcers or wounds of the lower extremities have occurred.   There is no history of DVT, PE or superficial thrombophlebitis. No documented recent episodes of angina or shortness of breath documented.     Today noninvasive studies show 139% stenosis of the bilateral internal carotid arteries.  No evidence of vertebral stenosis.  Antegrade flow seen bilaterally.  Normal flow hemodynamics in the bilateral subclavian arteries.     Review of Systems  All other systems reviewed and are negative.      Objective:    Physical Exam Vitals reviewed.  HENT:     Head: Normocephalic.  Neck:     Vascular: No carotid bruit.  Cardiovascular:     Rate and Rhythm: Normal rate.     Pulses: Normal pulses.  Pulmonary:     Effort: Pulmonary effort is normal.  Skin:    General: Skin is warm and dry.  Neurological:     Mental Status: She is alert and oriented to person, place, and time.  Psychiatric:        Mood and Affect: Mood normal.         Behavior: Behavior normal.        Thought Content: Thought content normal.        Judgment: Judgment normal.     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 Ashok Blake 2010   DNR no code (do not resuscitate) 03/20/2017   GERD (gastroesophageal reflux disease)    Hearing loss    History of gallstones    Hyperlipidemia    Hypertension    Hypertrophy of nasal turbinates    Leg mass, left    in calf muscle seeing surgeon tomorrow regarding   Lymphocytosis    Menopause age 69   Multiple food allergies    chocolate and cabbage   OSA on CPAP    Osteopenia 06/2013   Sleep apnea    Stroke (HCC) 09/11/2021   Thyroid  nodule  Vertigo    Vitamin D  deficiency disease     Social History   Socioeconomic History   Marital status: Married    Spouse name: [name]   Number of children: 1   Years of education: Not on file   Highest education level: Bachelor's degree (e.g., BA, AB, BS)  Occupational History   Occupation: Retired  Tobacco Use   Smoking status: Never   Smokeless tobacco: Never   Tobacco comments:    Never smoked, so cannot quit...  Vaping Use   Vaping status: Never Used  Substance and Sexual Activity   Alcohol use: Not Currently    Alcohol/week: 0.0 standard drinks of alcohol    Comment: Occasional celebratory drink (holidays, etc.) only.   Drug use: No   Sexual activity: Yes    Partners: Male    Birth control/protection: Post-menopausal  Other Topics Concern   Not on file  Social History Narrative   Lives with husband [name]   Occ: retired Diplomatic Services operational officer   Edu: BA   Act:    Diet:    Social Drivers of Manufacturing engineer Strain: Low Risk  (05/01/2023)   Overall Financial Resource Strain (CARDIA)    Difficulty of Paying Living Expenses: Not hard at all  Food Insecurity: No Food Insecurity (08/28/2023)   Hunger Vital Sign    Worried About Running Out of Food in the Last Year: Never true    Ran Out of Food in the Last Year: Never true  Transportation Needs: No Transportation Needs (08/28/2023)   PRAPARE - Administrator, Civil Service (Medical): No    Lack of Transportation (Non-Medical): No  Physical Activity: Inactive (05/01/2023)   Exercise Vital Sign    Days of Exercise per Week: 0 days    Minutes of Exercise per Session: 0 min  Stress: No Stress Concern Present (05/01/2023)   [employer] of Occupational Health - Occupational Stress Questionnaire    Feeling of Stress : Not at all  Social Connections: Moderately Isolated (05/01/2023)   Social Connection and Isolation Panel [NHANES]    Frequency of Communication with Friends and Family: More than three times a week    Frequency of Social Gatherings with Friends and Family: More than three times a week    Attends Religious Services: Never    Database administrator or Organizations: No    Attends Banker Meetings: Never    Marital Status: Married  Catering manager Violence: Not At Risk (08/28/2023)   Humiliation, Afraid, Rape, and Kick questionnaire    Fear of Current or Ex-Partner: No    Emotionally Abused: No    Physically Abused: No    Sexually Abused: No    Past Surgical History:  Procedure Laterality Date   BREAST CYST ASPIRATION Right    negative   CARPAL TUNNEL RELEASE Right 2015   CATARACT EXTRACTION W/PHACO Left 09/30/2018   Procedure: CATARACT EXTRACTION PHACO AND INTRAOCULAR LENS PLACEMENT (IOC)  LEFT;  Surgeon: Annell Kidney, MD;  Location: [facility];  Service: Ophthalmology;  Laterality: Left;  sleep apnea   CATARACT EXTRACTION W/PHACO Right 10/28/2018   Procedure:  CATARACT EXTRACTION PHACO AND INTRAOCULAR LENS PLACEMENT (IOC)  RIGHT;  Surgeon: Annell Kidney, MD;  Location: [facility];  Service: Ophthalmology;  Laterality: Right;   CHOLECYSTECTOMY  1990   COLONOSCOPY  04/25/2008   CYST EXCISION Left 2020   L popliteal/peroneal cyst excision s/p residual numbness   DILATION AND CURETTAGE OF  UTERUS  1982   s/p miscarriage   ESOPHAGOGASTRODUODENOSCOPY  08/2020   chronic gastritis, neg H pylori, no Barretts, no rpt needed [facility])    Family History  Problem Relation Age of Onset   Healthy Mother    Arthritis Mother    Lung disease Father        black lung   Heart disease Father    Hyperlipidemia Father    Hypertension Father    Emphysema Sister    Healthy Sister    Cancer Brother        unsure of type   Asthma Daughter    Heart disease Sister    Thyroid  disease Sister    Stroke Maternal Grandmother    Hypertension Maternal Grandmother    Diabetes Maternal Grandmother    Diabetes Sister    Healthy Sister    Breast cancer Neg Hx     Allergies  Allergen Reactions   Cabbage Other (See Comments)    Hypersensitivity on allergy testing   Chocolate Other (See Comments)    Hypersensitivity on allergy testing   Statins Other (See Comments)    Zocor and Pravastatin--leg cramps Other reaction(s): Other (See Comments) Zocor and Pravastatin--leg cramps       Latest Ref Rng & Units 08/26/2023   10:38 AM 08/26/2023    5:56 AM 06/25/2023    4:52 AM  CBC  WBC 4.0 - 10.5 K/uL 6.7  6.5  8.7   Hemoglobin 12.0 - 15.0 g/dL 60.4  54.0  98.1   Hematocrit 36.0 - 46.0 % 38.5  40.2  37.5   Platelets 150 - 400 K/uL 235  186  228        CMP     Component Value Date/Time   NA 141 08/26/2023 0556   NA 143 12/04/2015 0851   K 3.4 (L) 08/26/2023 0556   CL 108 08/26/2023 0556   CO2 22 08/26/2023 0556   GLUCOSE 95 08/26/2023 0556   BUN 17 08/26/2023 0556   BUN 11 12/04/2015 0851   CREATININE 0.68 08/26/2023 0556   CREATININE  1.10 (H) 05/02/2023 1458   CALCIUM  9.5 08/26/2023 0556   PROT 6.1 (L) 08/26/2023 0556   PROT 6.5 12/04/2015 0851   ALBUMIN 3.5 08/26/2023 0556   ALBUMIN 4.3 12/04/2015 0851   AST 36 08/26/2023 0556   ALT 20 08/26/2023 0556   ALKPHOS 73 08/26/2023 0556   BILITOT 1.1 08/26/2023 0556   BILITOT 0.9 12/04/2015 0851   GFR 57.24 (L) 07/02/2023 1219   GFRNONAA >60 08/26/2023 0556   GFRNONAA 75 10/08/2018 1156     No results found.     Assessment & Plan:   1. Carotid stenosis, bilateral (Primary) Recommend:   Given the patient's asymptomatic subcritical stenosis no further invasive testing or surgery at this time.   Duplex ultrasound shows <50% stenosis bilaterally.   Continue antiplatelet therapy as prescribed Continue management of CAD, HTN and Hyperlipidemia Healthy heart diet,  encouraged exercise at least 4 times per week   Follow up in 12 months with duplex ultrasound and physical exam   2. Acute cerebrovascular accident (CVA) due to ischemia [facility]) Previously it was noted that the patient had a distal internal carotid artery stenosis within the siphon portion.  It was felt that this may have been contributing to the previous stroke she had prior to December.  A referral was placed for evaluation by Dr. Zamordi at [facility] neurosurgery as this is intracranial and we are not able to treat.  Unfortunately patient and husband noted that they were not aware.  We discussed that seeing the specialist may help to clean whether intervention was possible or if medical therapy would be the best way forward.  At this time they do not wish to move forward with further referral and wish to continue medical therapy.  3. Primary hypertension Continue antihypertensive medications as already ordered, these medications have been reviewed and there are no changes at this time.  4. Mixed hyperlipidemia Continue statin as ordered and reviewed, no changes at this time   Current Outpatient Medications  on File Prior to Visit  Medication Sig Dispense Refill   amLODipine  (NORVASC ) 5 MG tablet Take 1 tablet (5 mg total) by mouth daily. 90 tablet 3   aspirin  EC 81 MG tablet Take 1 tablet (81 mg total) by mouth daily. Swallow whole. 30 tablet 12   buPROPion  ER (WELLBUTRIN  SR) 100 MG 12 hr tablet Take 1 tablet (100 mg total) by mouth daily. 90 tablet 4   Calcium  Carb-Cholecalciferol (CALCIUM  600+D) 600-800 MG-UNIT TABS Take by mouth daily. 2 capsule daily     clopidogrel  (PLAVIX ) 75 MG tablet Take 1 tablet (75 mg total) by mouth daily. 90 tablet 0   COLLAGEN PO Take by mouth.     Cyanocobalamin (B-12) 2500 MCG TABS Take 1 tablet by mouth daily. Pt taking 1/2 of a 5000 mcg tablet     donepezil  (ARICEPT ) 10 MG tablet Take 10 mg by mouth at bedtime.      famotidine  (PEPCID ) 20 MG tablet Take 1 tablet (20 mg total) by mouth 2 (two) times daily. For heartburn/indigestion 180 tablet 4   fluticasone  (FLONASE ) 50 MCG/ACT nasal spray instill TWO SPRAYS in each nostril daily (shake gently) 48 g 3   fluticasone -salmeterol (WIXELA INHUB) 250-50 MCG/ACT AEPB Inhale 1 puff into the lungs in the morning and at bedtime. 60 each 11   gabapentin  (NEURONTIN ) 100 MG capsule Take 1 capsule (100 mg total) by mouth in the morning.     meclizine  (ANTIVERT ) 25 MG tablet Take 1 tablet (25 mg total) by mouth 3 (three) times daily as needed for dizziness. 30 tablet 0   memantine  (NAMENDA ) 10 MG tablet Take 10 mg by mouth 2 (two) times daily.     mirabegron  ER (MYRBETRIQ ) 25 MG TB24 tablet Take 1 tablet (25 mg total) by mouth daily. 90 tablet 3   Multiple Vitamin (MULTIVITAMIN) tablet Take 1 tablet by mouth daily.     pantoprazole  (PROTONIX ) 40 MG tablet Take 40 mg by mouth daily.     rosuvastatin  (CRESTOR ) 20 MG tablet Take 1 tablet (20 mg total) by mouth daily. 30 tablet 1   Study - OCEANIC-STROKE - asundexian 50 mg or placebo tablet (PI-Sethi) Take 1 tablet (50 mg total) by mouth daily. 30 tablet 0   No current  facility-administered medications on file prior to visit.    There are no Patient Instructions on file for this visit. No follow-ups on file.   Vishnu Moeller E Tahmir Kleckner, NP

## 2024-01-27 ENCOUNTER — Encounter (INDEPENDENT_AMBULATORY_CARE_PROVIDER_SITE_OTHER): Payer: Self-pay

## 2024-02-10 DIAGNOSIS — H43813 Vitreous degeneration, bilateral: Secondary | ICD-10-CM | POA: Diagnosis not present

## 2024-02-10 DIAGNOSIS — Z961 Presence of intraocular lens: Secondary | ICD-10-CM | POA: Diagnosis not present

## 2024-02-23 ENCOUNTER — Ambulatory Visit (INDEPENDENT_AMBULATORY_CARE_PROVIDER_SITE_OTHER): Admitting: Internal Medicine

## 2024-02-23 ENCOUNTER — Encounter: Payer: Self-pay | Admitting: Internal Medicine

## 2024-02-23 VITALS — BP 138/72 | HR 95 | Temp 98.0°F | Ht 62.0 in | Wt 138.0 lb

## 2024-02-23 DIAGNOSIS — L03115 Cellulitis of right lower limb: Secondary | ICD-10-CM | POA: Insufficient documentation

## 2024-02-23 MED ORDER — DOXYCYCLINE HYCLATE 100 MG PO TABS: 100.0000 mg | ORAL_TABLET | Freq: Two times a day (BID) | ORAL | 0 refills | Status: DC

## 2024-02-23 NOTE — Progress Notes (Signed)
 Subjective:    Patient ID: Kathy Baldwin, female    DOB: 1946-05-25, 78 y.o.   MRN: [id number]  HPI Here due to skin lesion on right leg With husband  First noticed 2-3 days ago Started as red bump--spreading since then Didn't notice bite---had been working in the yard before it  No fever No headache No tick noted  Tried after bite and neosporin--not really helpful  Current Outpatient Medications on File Prior to Visit  Medication Sig Dispense Refill   amLODipine  (NORVASC ) 5 MG tablet Take 1 tablet (5 mg total) by mouth daily. 90 tablet 3   aspirin  EC 81 MG tablet Take 1 tablet (81 mg total) by mouth daily. Swallow whole. 30 tablet 12   buPROPion  ER (WELLBUTRIN  SR) 100 MG 12 hr tablet Take 1 tablet (100 mg total) by mouth daily. 90 tablet 4   Calcium  Carb-Cholecalciferol (CALCIUM  600+D) 600-800 MG-UNIT TABS Take by mouth daily. 2 capsule daily     clopidogrel  (PLAVIX ) 75 MG tablet Take 1 tablet (75 mg total) by mouth daily. 90 tablet 0   COLLAGEN PO Take by mouth.     Cyanocobalamin (B-12) 2500 MCG TABS Take 1 tablet by mouth daily. Pt taking 1/2 of a 5000 mcg tablet     donepezil  (ARICEPT ) 10 MG tablet Take 10 mg by mouth at bedtime.      famotidine  (PEPCID ) 20 MG tablet Take 1 tablet (20 mg total) by mouth 2 (two) times daily. For heartburn/indigestion 180 tablet 4   fluticasone  (FLONASE ) 50 MCG/ACT nasal spray instill TWO SPRAYS in each nostril daily (shake gently) 48 g 3   fluticasone -salmeterol (WIXELA INHUB) 250-50 MCG/ACT AEPB Inhale 1 puff into the lungs in the morning and at bedtime. 60 each 11   gabapentin  (NEURONTIN ) 100 MG capsule Take 1 capsule (100 mg total) by mouth in the morning.     meclizine  (ANTIVERT ) 25 MG tablet Take 1 tablet (25 mg total) by mouth 3 (three) times daily as needed for dizziness. 30 tablet 0   memantine  (NAMENDA ) 10 MG tablet Take 10 mg by mouth 2 (two) times daily.     mirabegron  ER (MYRBETRIQ ) 25 MG TB24 tablet Take 1 tablet (25 mg  total) by mouth daily. 90 tablet 3   Multiple Vitamin (MULTIVITAMIN) tablet Take 1 tablet by mouth daily.     pantoprazole  (PROTONIX ) 40 MG tablet Take 40 mg by mouth daily.     rosuvastatin  (CRESTOR ) 20 MG tablet Take 1 tablet (20 mg total) by mouth daily. 30 tablet 1   Study - OCEANIC-STROKE - asundexian 50 mg or placebo tablet (Kathy Baldwin) Take 1 tablet (50 mg total) by mouth daily. 30 tablet 0   No current facility-administered medications on file prior to visit.    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 Ashok Baldwin 2010   DNR no code (do not resuscitate) 03/20/2017  GERD (gastroesophageal reflux disease)    Hearing loss    History of gallstones    Hyperlipidemia    Hypertension    Hypertrophy of nasal turbinates    Leg mass, left    in calf muscle seeing surgeon tomorrow regarding   Lymphocytosis    Menopause age 76   Multiple food allergies    chocolate and cabbage   OSA on CPAP    Osteopenia 06/2013   Sleep apnea    Stroke (HCC) 09/11/2021   Thyroid  nodule    Vertigo    Vitamin D  deficiency disease     Past Surgical History:  Procedure Laterality Date   BREAST CYST ASPIRATION Right    negative   CARPAL TUNNEL RELEASE Right 2015   CATARACT EXTRACTION W/PHACO Left 09/30/2018   Procedure: CATARACT EXTRACTION PHACO AND INTRAOCULAR LENS PLACEMENT (IOC)  LEFT;  Surgeon: Kathy Kidney, MD;   Location: [facility];  Service: Ophthalmology;  Laterality: Left;  sleep apnea   CATARACT EXTRACTION W/PHACO Right 10/28/2018   Procedure: CATARACT EXTRACTION PHACO AND INTRAOCULAR LENS PLACEMENT (IOC)  RIGHT;  Surgeon: Kathy Kidney, MD;  Location: [facility];  Service: Ophthalmology;  Laterality: Right;   CHOLECYSTECTOMY  1990   COLONOSCOPY  04/25/2008   CYST EXCISION Left 2020   L popliteal/peroneal cyst excision s/p residual numbness   DILATION AND CURETTAGE OF UTERUS  1982   s/p miscarriage   ESOPHAGOGASTRODUODENOSCOPY  08/2020   chronic gastritis, neg H pylori, no Barretts, no rpt needed [facility])    Family History  Problem Relation Age of Onset   Healthy Mother    Arthritis Mother    Lung disease Father        black lung   Heart disease Father    Hyperlipidemia Father    Hypertension Father    Emphysema Sister    Healthy Sister    Cancer Brother        unsure of type   Asthma Daughter    Heart disease Sister    Thyroid  disease Sister    Stroke Maternal Grandmother    Hypertension Maternal Grandmother    Diabetes Maternal Grandmother    Diabetes Sister    Healthy Sister    Breast cancer Neg Hx     Social History   Socioeconomic History   Marital status: Married    Spouse name: Kathy Baldwin   Number of children: 1   Years of education: Not on file   Highest education level: Bachelor's degree (e.g., BA, AB, BS)  Occupational History   Occupation: Retired  Tobacco Use   Smoking status: Never   Smokeless tobacco: Never   Tobacco comments:    Never smoked, so cannot quit...  Vaping Use   Vaping status: Never Used  Substance and Sexual Activity   Alcohol use: Not Currently    Alcohol/week: 0.0 standard drinks of alcohol    Comment: Occasional celebratory drink (holidays, etc.) only.   Drug use: No   Sexual activity: Yes    Partners: Male    Birth control/protection: Post-menopausal  Other Topics Concern   Not on file  Social History  Narrative   Lives with husband Kathy Baldwin   Occ: retired Diplomatic Services operational officer   Edu: BA   Act:    Diet:    Social Drivers of Corporate investment banker Strain: Low Risk  (05/01/2023)   Overall Financial Resource Strain (CARDIA)    Difficulty of Paying Living Expenses: Not hard at all  Food Insecurity:  No Food Insecurity (08/28/2023)   Hunger Vital Sign    Worried About Running Out of Food in the Last Year: Never true    Ran Out of Food in the Last Year: Never true  Transportation Needs: No Transportation Needs (08/28/2023)   PRAPARE - Administrator, Civil Service (Medical): No    Lack of Transportation (Non-Medical): No  Physical Activity: Inactive (05/01/2023)   Exercise Vital Sign    Days of Exercise per Week: 0 days    Minutes of Exercise per Session: 0 min  Stress: No Stress Concern Present (05/01/2023)   [employer] of Occupational Health - Occupational Stress Questionnaire    Feeling of Stress : Not at all  Social Connections: Moderately Isolated (05/01/2023)   Social Connection and Isolation Panel    Frequency of Communication with Friends and Family: More than three times a week    Frequency of Social Gatherings with Friends and Family: More than three times a week    Attends Religious Services: Never    Database administrator or Organizations: No    Attends Banker Meetings: Never    Marital Status: Married  Catering manager Violence: Not At Risk (08/28/2023)   Humiliation, Afraid, Rape, and Kick questionnaire    Fear of Current or Ex-Partner: No    Emotionally Abused: No    Physically Abused: No    Sexually Abused: No   Review of Systems No N/V Doesn't feel sick     Objective:   Physical Exam  Skin:    Comments: Deep red center (with some blister appearance) with surrounding redness and slight warmth on right lower calf No fluctuance--but some induration under lesion            Assessment & Plan:

## 2024-02-23 NOTE — Assessment & Plan Note (Signed)
 Likely from insect (possibly spider) bite No fluctuance but recommended warm compresses to see if she can achieve drainage Doxy 100  bid x 7 days

## 2024-03-10 DIAGNOSIS — Z8673 Personal history of transient ischemic attack (TIA), and cerebral infarction without residual deficits: Secondary | ICD-10-CM | POA: Diagnosis not present

## 2024-03-10 DIAGNOSIS — R030 Elevated blood-pressure reading, without diagnosis of hypertension: Secondary | ICD-10-CM | POA: Diagnosis not present

## 2024-03-10 DIAGNOSIS — F02A3 Dementia in other diseases classified elsewhere, mild, with mood disturbance: Secondary | ICD-10-CM | POA: Diagnosis not present

## 2024-03-10 DIAGNOSIS — I639 Cerebral infarction, unspecified: Secondary | ICD-10-CM | POA: Diagnosis not present

## 2024-03-10 DIAGNOSIS — G301 Alzheimer's disease with late onset: Secondary | ICD-10-CM | POA: Diagnosis not present

## 2024-03-10 NOTE — Progress Notes (Signed)
 Ref Provider: Maree Jannett Bong* PCP: [facility] Assessment and Plan:   In most patients we give written parts of assessment and plan to patient under Patient Instructions/After Visit Summary. So some parts are directed to patient.  Dear Ms. Alexxis Mackert, It was our pleasure to participate in your care in person. We have typed up brief summary of what we discussed. 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 Cognitive decline over four years with progressive short-term memory loss, misplacing objects, and occasional word-finding difficulty. Currently on donepezil  10 mg daily and memantine  10 mg twice daily without side effects. Recent evaluation by Dr. Kennyth at [facility] indicated no additional treatment options. Discussed potential benefits of speech therapy and brain training apps like [employer]. Newer beta-amyloid removal medications are contraindicated due to recent stroke history.   - Continue Memantine  10 mg by mouth once every morning - Continue donepezil  10 mg by mouth at bedtime - We will order a home health consult with physical therapy, occupational therapy, speech therapy, nursing, social work.  We send patients to [facility]. They will verify your insurance information and reach out to schedule an appointment. 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  2 strokes in late 2024  Multiple strokes, most recent in December 2024, with previous strokes in 2023 and November 2024. MRI showed acute infarct in left medial parietal cortex. Right-sided hemiparesis and urge incontinence noted. On Plavix , aspirin , and  rosuvastatin  10 mg for stroke prevention. Brilinta  trialed for one month without benefit. No atrial fibrillation or DVT. Undergoing physical therapy for right leg weakness. Emphasized controlling blood pressure, glucose, and cholesterol, and adopting a whole food, plant-based diet. Importance of physical activity and strength training discussed. - Continue Plavix  and aspirin  - Continue rosuvastatin  10 mg - Continue physical therapy for right leg weakness  MRI Head without contrast (08/26/23) - Acute infarct in the medial left parietal cortex.   Hypertension Blood pressure is elevated at 150/70 mmHg, higher than her usual 140/70 mmHg, possibly related to stress from driving. No acute symptoms reported. - Recheck blood pressure before leaving the clinic  Return in about 6 months (around 09/10/2024) for Memory, With Allyson Stallion, NP.  Interim History date 03/10/2024   Ms. Runde is a 78 y.o. female here for treatment and evaluation of Memory Loss, accompanied by loved one.  Ms. Awtrey last visit was on 10/27/2023 History of Present Illness Dorianna Mckiver is a 78 year old female who presents for a neurology follow-up due to memory decline.  She is experiencing a gradual decline in memory with no recent changes in her medication regimen. There have been no falls or wandering outside the house, although she does wander around the house. No stroke-like symptoms such as blurred speech or severe headaches have occurred since the last visit.  Her mood is mostly happy, with no significant anger, irritability, or agitation. No hallucinations or visual disturbances. Sleep and appetite are well maintained.  She reports some issues with bowel and bladder control, described as 'up and down' and currently managed. She attributes some of her stress to driving. No recent falls or trouble swallowing.  She is scheduled for a hip replacement surgery tomorrow  and has been in contact with her surgery  hospital in [place]. There are no safety concerns at home, and she has not expressed any additional questions or concerns.  I reviewed patient's imaging report and actual images, on my read I agree with the radiologist report. 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 Currently on donepezil  10 mg daily and memantine  10 mg twice daily without side effects. Recent evaluation by Dr. Kennyth at [facility] indicated no additional treatment options. Cognitive impairment has been present for approximately four years, characterized by progressive short-term memory issues, misplacing objects, and occasional word-finding difficulties. She is currently on donepezil  10 mg daily and memantine  10 mg twice a day for memory support, with no reported side effects. Her cognitive decline is believed to be influenced by her history of strokes.  MMSE (10/21/22) score of 28/30 Mini-Cog (09/11/23): 1/5 (-1 clock numbers; -3 word recall)   Medications: memantine , donepezil   Visit Summaries:  10/27/2023: no significant changes in memory 10/24/2021: She stated memory is about the same. Takes Aricept  10 mg and Namenda  10 mg twice a day Memory evaluation today was 29/30. Continue to keep brain active by reading or doing puzzles for 15-20 minutes per day.Recommend her to only drive local day trips.Encouraged her to exercise for 15-20 minutes per day Refill Aricept  10 mg nightly.Refill Namenda  10 mg twice a day. Repeat memory evaluation at next visit. 04/25/21: Patient with short term memory impairment. Memory evaluation today was 28/30.  Patient is still driving. Continue Aricept  10 mg and Namenda  10 mg.  08/24/2019: Symptoms most consistent with mild memory loss.Reviewed SLUMS with patient and it was 20/30.Encouraged patient to stimulate brain by reading or doing puzzles. Recommend patient to exercise for 15-20 minutes per day. Refill Aricept  10 mg at night. Suggested patient to use pill box to help with organization of her medications. Repeat SLUMS at next visit. Will consider starting Namenda  at next visit depending on score of SLUMS. 02/16/19: Memory loss, ongoing. Increase Aricept  to 10 mg nightly.  11/16/18: Patient with memory loss, new to me. Start aricept  5 mg nightly. Continue aspirin  325 mg daily. Will continue to monitor.  Obstructive Sleep Apnea, on CPAP Diagnosed with obstructive sleep apnea, regularly uses CPAP therapy.   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  2 strokes in late 2024 Multiple strokes, most recent in December 2024, with previous strokes in 2023 and November 2024. MRI showed acute infarct in left medial parietal cortex. Right-sided hemiparesis and urge incontinence noted. On Plavix , aspirin , and rosuvastatin  10 mg for stroke prevention. Brilinta  trialed for one month without benefit. No atrial fibrillation or DVT. Undergoing physical therapy for right leg weakness.  She has a history of multiple strokes, with the first occurring in 2023 and two subsequent strokes in November and December 2024. These events have contributed to her cognitive decline and resulted in right-sided hemiparesis. An MRI of the brain showed an acute infarct in the left medial parietal cortex. She has been evaluated for potential causes of her strokes, including a Holter monitor test, which showed no cardiac issues. She is on Plavix , aspirin , and rosuvastatin  10 mg for stroke prevention.  She has also tried Brilinta  for a month but is not currently taking it. She experiences  right-sided hemiparesis and is undergoing physical therapy to improve her balance and strength, particularly in her right leg. Her right leg remains weaker than her left. She is independent in activities of daily living but has difficulty with driving, often forgetting directions or destinations. 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 She enjoys fruits and salads and engages in physical activity through physical therapy.  MRI Head without contrast (08/26/23) IMPRESSION:  Acute infarct in the medial left parietal cortex.   CT HEAD without contrast (09/12/21) IMPRESSION: Stable non contrast CT appearance of the brain. No acute  intracranial hemorrhage identified. Chronic cerebellar infarcts and  white matter disease.    MRI BRAIN without contrast (09/11/21) IMPRESSION:  No acute or subacute insult. Old small vessel cerebellar  infarctions. Chronic small vessel ischemic changes of the cerebral  hemispheric white matter. No appreciable change since 2019.    CTA HEAD with and without contrast (09/11/21) Anterior circulation: Intracranial internal carotid arteries are  patent with calcified plaque causing mild stenosis. Anterior and  middle cerebral arteries are patent. Right A1 ACA is dominant. Left  A1 ACA is small or absent. Anterior communicating artery is present.  Middle cerebral arteries are patent.  Posterior circulation: Intracranial vertebral arteries are patent.  Basilar artery is patent. Major cerebellar artery origins are  patent. Posterior cerebral arteries are patent.  Venous sinuses: Patent as allowed by contrast bolus timing.  Review of the MIP images confirms the above findings    IMPRESSION:  No large vessel occlusion or hemodynamically significant stenosis.  Calcified plaque at the proximal right ICA causes less than 50%  stenosis.    MRI BRAIN  (04/29/18) IMPRESSION: Chronic small-vessel ischemic changes affecting the brain as outlined above. No acute or subacute insult. No vestibular schwannoma or specific cause of vertigo.   CAROTID US  BILATERAL (10/23/18) IMPRESSION: Left carotid - Velocities in the left ICA are consistent with a 1-39% stenosis. Very minimal calcified plaque in the bulb.  Right carotid - Velocities in the right ICA are consistent with a 1-39% stenosis. Mild to moderate calcified plaque in the bulb.  Vertebrals - bilateral vertebral arteries demonstrate antegrade flow.  Subclavians - normal flow hemodynamics were seen in bilateral subclavian arteries.   Medications: Plavix , aspirin , rosuvastatin , Brilinta  (stopped)  Hearing Impairment Diagnosed with hearing impairment, uses hearing aids.  General Exam:  Vitals Vitals:   03/10/24 0928 03/10/24 0929  BP:  (!) 150/70  Pulse:  73  SpO2:  98%  Weight: 64.1 kg (141 lb 6.4 oz)   Height: 160 cm (5' 3)   PainSc:  0-No pain    Body mass index is 25.05 kg/m. Neurological exam appropriate for the patient's condition was performed.  Physical Exam CARDIOVASCULAR: Minor carotid plaque.   VITALS: BP- 150/70  General exam 10/27/23 - bilateral Intraocular Lense   Neuro exam 10/27/23 Correct: day, month, year, address, # of children, # of grandchildren  Below information was reviewed by KAITLIN CAFFARO .  Past Medical History:  Past Medical History:  Diagnosis Date  . 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 Cerebrovascular accident (CVA) (CMS/HHS-HCC) 09/11/2023  . GERD (gastroesophageal reflux disease)   . Groin discomfort 04/15/2012  . History of stroke   .  Hyperlipidemia   . Hypertension   . Multiple thyroid  nodules    Stable on serial ultrasounds, last in 06/2019. No routine follow-up ultrasound needed.   . Osteopenia   . Sleep apnea    C-PAP machine, usage began: 12/14/2012     Past Surgical History:  Past Surgical History:  Procedure Laterality Date  . NSVD  1980   daughter  . DILATION AND CURETTAGE OF UTERUS  1982   miscarriage  . Right Carpal Tunnel Release  02/11/2014  . EGD  08/08/2020   Gastritis/No repeat/TKT  . ENDOSCOPIC CARPAL TUNNEL RELEASE Left 04/02/2023   By Dr. Kathlynn  . biopsy leg cyst    . CHOLECYSTECTOMY  1980s   laparoscopic  . CHOLECYSTECTOMY    . cyst removal left leg     Family History:  Family History  Problem Relation Name Age of Onset  . Parkinsonism Mother @95    . Osteoporosis (Thinning of bones) Mother @95    . Coronary Artery Disease (Blocked arteries around heart) Father coal miner with balck lu   . Diabetes type II Maternal Grandmother    . Diabetes type II Sister Sherrilyn   . High blood pressure (Hypertension) Sister Sherrilyn   . Diabetes type II Sister    . Diabetes type II Sister    . Coronary Artery Disease (Blocked arteries around heart) Brother    . High blood pressure (Hypertension) Sister Zella   . Emphysema Sister    . Other Brother measles and PNA, passed   . Other Brother killed by drunk driver   . Breast cancer Neg Hx     Social History:  Social History   Socioeconomic History  . Marital status: Married    Spouse name: Aamina Skiff  . Number of children: 1  . Years of education: 64  Occupational History  . Occupation: retired    Associate Professor: [id number]    Comment: [employer]   Tobacco Use  . Smoking status: Never  . Smokeless tobacco: Never  Vaping Use  . Vaping status: Never Used  Substance and Sexual Activity  . Alcohol use: Not Currently    Comment: Holiday drink or two, about extent of drinking alcoholic bev  . Drug use: No  . Sexual activity: Yes    Partners: Male    Birth control/protection: Post-menopausal, None    Comment: Partner is my husband   Social Drivers of Corporate investment banker Strain: Low Risk  (05/01/2023)   Received from [facility]   Overall Financial  Resource Strain (CARDIA)   . Difficulty of Paying Living Expenses: Not hard at all  Food Insecurity: No Food Insecurity (08/28/2023)   Received from [facility]   Hunger Vital Sign   . Within the past 12 months, you worried that your food would run out before you got the money to buy more.: Never true   . Within the past 12 months, the food you bought just didn't last and you didn't have money to get more.: Never true  Transportation Needs: No Transportation Needs (08/28/2023)   Received from [facility] - Transportation   . Lack of Transportation (Medical): No   . Lack of Transportation (Non-Medical): No  Physical Activity: Inactive (05/01/2023)   Received from [facility]   Exercise Vital Sign   . On average, how many days per week do you engage in moderate to strenuous exercise (like a brisk walk)?: 0 days   . On average, how many  minutes do you engage in exercise at this level?: 0 min  Stress: No Stress Concern Present (05/01/2023)   Received from [facility] - Occupational Stress Questionnaire   . Feeling of Stress : Not at all  Social Connections: Moderately Isolated (05/01/2023)   Received from [facility]   Social Connection and Isolation Panel   . In a typical week, how many times do you talk on the phone with family, friends, or neighbors?: More than three times a week   . How often do you get together with friends or relatives?: More than three times a week   . How often do you attend church or religious services?: Never   . Do you belong to any clubs or organizations such as church groups, unions, fraternal or athletic groups, or school groups?: No   . How often do you attend meetings of the clubs or organizations you belong to?: Never   . Are you married, widowed, divorced, separated, never married, or living with a partner?: Married  Housing Stability: Unknown (10/23/2023)   Housing Stability Vital Sign   . Homeless in the  Last Year: No   Allergies:  Allergies  Allergen Reactions  . Cabbage Other (See Comments)    Hypersensitivity on allergy testing  . Chocolate Flavor Other (See Comments)    Hypersensitivity on allergy testing  . Statins-Hmg-Coa Reductase Inhibitors Muscle Pain    Zocor and Pravastatin   Medications: Current Outpatient Medications on File Prior to Visit  Medication Sig Dispense Refill  . amLODIPine  (NORVASC ) 5 MG tablet Take 5 mg by mouth once daily    . aspirin  81 MG EC tablet Take 81 mg by mouth once daily    . buPROPion  (WELLBUTRIN  SR) 100 MG SR tablet Take 100 mg by mouth 2 (two) times daily    . calcium  carbonate-vitamin D3 600 mg(1,500mg ) -800 unit Tab Take by mouth    . clopidogreL  (PLAVIX ) 75 mg tablet Take 1 tablet (75 mg total) by mouth once daily 90 tablet 1  . conjugated estrogens  (PREMARIN ) 0.625 mg/gram vaginal cream Use a pea-sized amount externally and internally every night for 2 weeks, then just twice a week    . cyanocobalamin, vitamin B-12, 2,500 mcg Tab Take 1 tablet by mouth once daily      . donepeziL  (ARICEPT ) 10 MG tablet TAKE 1 TABLET BY MOUTH AT BEDTIME 90 tablet 0  . fluticasone  (FLONASE ) 50 mcg/actuation nasal spray by Nasal route.    . gabapentin  (NEURONTIN ) 100 MG capsule Take 100 mg by mouth 2 (two) times daily    . meclizine  (ANTIVERT ) 25 mg tablet TK 1 T PO Q 8 H PRF VERTIGO  10  . memantine  (NAMENDA ) 10 MG tablet TAKE 1 TABLET BY MOUTH ONCE EVERY MORNING AND 1 TABLET ONCE EVERY EVENING 180 tablet 0  . multivitamin-Ca-iron-minerals Tab Take by mouth    . MYRBETRIQ  25 mg ER Tablet Take 25 mg by mouth once daily    . naproxen  sodium (ALEVE , ANAPROX ) 220 MG tablet Take 220 mg by mouth 2 (two) times daily with meals    . pantoprazole  (PROTONIX ) 40 MG DR tablet Take 40 mg by mouth once daily    . rosuvastatin  (CRESTOR ) 10 MG tablet Take 10 mg by mouth once daily    . WIXELA INHUB 250-50 mcg/dose diskus inhaler Inhale 1 Puff into the lungs every 12 (twelve)  hours    . [DISCONTINUED] atorvastatin  (LIPITOR) 10 MG tablet  Take 1 tablet by mouth once daily.     No current facility-administered medications on file prior to visit.   This note has been created using automated tools and reviewed for accuracy by KAITLIN CAFFARO. Parts of this note were created with a combination of Dragon dictation and automated scribing services. Please note any typographical errors are unintentional. I apologize for any typographical errors that were not detected and corrected.   Return in about 6 months (around 09/10/2024) for Memory, With Allyson Stallion, NP. The patient will contact us  earlier if severity of neurologic symptoms increases or changes. This may or may not, depending on description of symptoms, necessitate an earlier appointment.    Payor: [employer] / Plan: [employer]ANT[employer] / Product Type: PPO /    Attestation Statement:   I personally performed the service, non-incident to. (WP)   KAITLIN CAFFARO, PA   Kaitlin Caffaro, PA-C Board Certified by [facility] - Neurology  A [facility]

## 2024-03-17 DIAGNOSIS — I10 Essential (primary) hypertension: Secondary | ICD-10-CM | POA: Diagnosis not present

## 2024-03-17 DIAGNOSIS — G301 Alzheimer's disease with late onset: Secondary | ICD-10-CM | POA: Diagnosis not present

## 2024-03-17 DIAGNOSIS — F02A3 Dementia in other diseases classified elsewhere, mild, with mood disturbance: Secondary | ICD-10-CM | POA: Diagnosis not present

## 2024-03-17 DIAGNOSIS — E042 Nontoxic multinodular goiter: Secondary | ICD-10-CM | POA: Diagnosis not present

## 2024-03-17 DIAGNOSIS — N3941 Urge incontinence: Secondary | ICD-10-CM | POA: Diagnosis not present

## 2024-03-17 DIAGNOSIS — I69351 Hemiplegia and hemiparesis following cerebral infarction affecting right dominant side: Secondary | ICD-10-CM | POA: Diagnosis not present

## 2024-03-17 DIAGNOSIS — I6931 Attention and concentration deficit following cerebral infarction: Secondary | ICD-10-CM | POA: Diagnosis not present

## 2024-03-17 DIAGNOSIS — K219 Gastro-esophageal reflux disease without esophagitis: Secondary | ICD-10-CM | POA: Diagnosis not present

## 2024-03-17 DIAGNOSIS — Z9181 History of falling: Secondary | ICD-10-CM | POA: Diagnosis not present

## 2024-03-17 DIAGNOSIS — E785 Hyperlipidemia, unspecified: Secondary | ICD-10-CM | POA: Diagnosis not present

## 2024-03-17 DIAGNOSIS — Z7982 Long term (current) use of aspirin: Secondary | ICD-10-CM | POA: Diagnosis not present

## 2024-03-17 DIAGNOSIS — Z7902 Long term (current) use of antithrombotics/antiplatelets: Secondary | ICD-10-CM | POA: Diagnosis not present

## 2024-03-17 DIAGNOSIS — Z792 Long term (current) use of antibiotics: Secondary | ICD-10-CM | POA: Diagnosis not present

## 2024-03-17 DIAGNOSIS — G4733 Obstructive sleep apnea (adult) (pediatric): Secondary | ICD-10-CM | POA: Diagnosis not present

## 2024-03-25 DIAGNOSIS — E785 Hyperlipidemia, unspecified: Secondary | ICD-10-CM | POA: Diagnosis not present

## 2024-03-25 DIAGNOSIS — I10 Essential (primary) hypertension: Secondary | ICD-10-CM | POA: Diagnosis not present

## 2024-03-25 DIAGNOSIS — G4733 Obstructive sleep apnea (adult) (pediatric): Secondary | ICD-10-CM | POA: Diagnosis not present

## 2024-03-25 DIAGNOSIS — F02A3 Dementia in other diseases classified elsewhere, mild, with mood disturbance: Secondary | ICD-10-CM | POA: Diagnosis not present

## 2024-03-25 DIAGNOSIS — K219 Gastro-esophageal reflux disease without esophagitis: Secondary | ICD-10-CM | POA: Diagnosis not present

## 2024-03-25 DIAGNOSIS — G301 Alzheimer's disease with late onset: Secondary | ICD-10-CM | POA: Diagnosis not present

## 2024-03-25 DIAGNOSIS — I69351 Hemiplegia and hemiparesis following cerebral infarction affecting right dominant side: Secondary | ICD-10-CM | POA: Diagnosis not present

## 2024-04-14 ENCOUNTER — Ambulatory Visit: Payer: Self-pay

## 2024-04-14 NOTE — Telephone Encounter (Signed)
 FYI Only or Action Required?: FYI only for provider.  Patient was last seen in primary care on 02/23/2024 by Kathy Charlie FERNS, MD.  Called Nurse Triage reporting Leg Pain.  Symptoms began several weeks ago.  Interventions attempted: Nothing.  Symptoms are: unchanged.  Triage Disposition: See PCP When Office is Open (Within 3 Days)  Patient/caregiver understands and will follow disposition?: No  Copied from CRM #[id number]. Topic: Clinical - Red Word Triage >> Apr 14, 2024  5:34 PM Harlene ORN wrote: Red Word that prompted transfer to Nurse Triage: pain in both legs for a month gotten worse in the past 2 weeks. Never talked about it with anyone. Reason for Disposition  [1] MODERATE pain (e.g., interferes with normal activities, limping) AND [2] present > 3 days  Answer Assessment - Initial Assessment Questions 1. ONSET: When did the pain start?      Several weeks 2. LOCATION: Where is the pain located?      BLE 3. PAIN: How bad is the pain?    (Scale 1-10; or mild, moderate, severe)     5 5. CAUSE: What do you think is causing the leg pain?     Had surgery on L leg couple years ago, states nerve damage since then 6. OTHER SYMPTOMS: Do you have any other symptoms? (e.g., chest pain, back pain, breathing difficulty, swelling, rash, fever, numbness, weakness)     Denies  Caller offered appt at 0730 on 8/8, declined, states to early. Only willing to see PCP, scheduled next avail appt with PCP 8/12.  Protocols used: Leg Pain-A-AH

## 2024-04-15 NOTE — Telephone Encounter (Signed)
 I spoke with pts husband (DPR signed) and for about one month pt has had bilateral leg pain that has worsened in last 2 -3 days. Pt only wants to see DrG. Mr Mcallister said denies injury, and no swelling redness or warmth to legs. No CP or SOB. Mr Maceachern appreciative of appt with Dr KANDICE on 04/16/24 at 4pm with UC & ED precautions and Mr Calvert voiced understanding.  Sending note as FYI to Dr KANDICE.

## 2024-04-15 NOTE — Telephone Encounter (Signed)
 Would offer tomorrow appt at 4:00pm

## 2024-04-16 ENCOUNTER — Ambulatory Visit (INDEPENDENT_AMBULATORY_CARE_PROVIDER_SITE_OTHER): Admitting: Family Medicine

## 2024-04-16 ENCOUNTER — Encounter: Payer: Self-pay | Admitting: Family Medicine

## 2024-04-16 VITALS — BP 138/70 | HR 67 | Temp 98.1°F | Ht 62.0 in | Wt 138.1 lb

## 2024-04-16 DIAGNOSIS — M6740 Ganglion, unspecified site: Secondary | ICD-10-CM | POA: Diagnosis not present

## 2024-04-16 DIAGNOSIS — R252 Cramp and spasm: Secondary | ICD-10-CM | POA: Diagnosis not present

## 2024-04-16 DIAGNOSIS — M79641 Pain in right hand: Secondary | ICD-10-CM

## 2024-04-16 DIAGNOSIS — F03A3 Unspecified dementia, mild, with mood disturbance: Secondary | ICD-10-CM

## 2024-04-16 DIAGNOSIS — Z1211 Encounter for screening for malignant neoplasm of colon: Secondary | ICD-10-CM

## 2024-04-16 DIAGNOSIS — M79604 Pain in right leg: Secondary | ICD-10-CM

## 2024-04-16 DIAGNOSIS — M79642 Pain in left hand: Secondary | ICD-10-CM | POA: Diagnosis not present

## 2024-04-16 DIAGNOSIS — M79605 Pain in left leg: Secondary | ICD-10-CM | POA: Diagnosis not present

## 2024-04-16 NOTE — Patient Instructions (Addendum)
 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 May use tylenol , topical voltaren three times daily as needed.  For legs - unsure cause. I'd like to check muscle test to ensure not a side effect to the rosuvastatin .  Let us  know if worsening symptoms.

## 2024-04-16 NOTE — Progress Notes (Signed)
 Ph: [phone] Fax: [phone]   Patient ID: Kathy Baldwin, female    DOB: 16-Jul-1946, 78 y.o.   MRN: [id number]  This visit was conducted in person.  BP 138/70   Pulse 67   Temp 98.1 F (36.7 C) (Oral)   Ht 5' 2 (1.575 m)   Wt 138 lb 2 oz (62.7 kg)   SpO2 95%   BMI 25.26 kg/m    CC: bilat leg pain, hand pain x1 month Subjective:   HPI: Kathy Baldwin is a 78 y.o. female presenting on 04/16/2024 for Leg Pain (Pt C/o Dull pain in both legs Also pain in middle fingers on both hands.Started about a month ago.         /Pt accompanied by her husband Jack/       )   1 month history of bilateral L>R lateral leg pain and hand pain (to middle fingers). Pain described as achey throbbing dull, not sharp stabbing cramping or burning pain. No low back pain. No claudication symptoms.   Bilateral L>R 3rd PIP joint pain for the past 6+ months. No redness or warmgth but joints stay swollen and stiff.   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 Biopsy 10/2018 consistent with tendinosis or ganglion cyst formation. Biopsy 03/2019 consistent with ganglion cyst.   Requesting updated Cologuard order. Last done 03/2021      Relevant past medical, surgical, family and social history reviewed and updated as indicated. Interim medical history since our last visit reviewed. Allergies and medications reviewed and updated. Outpatient Medications Prior to Visit  Medication Sig Dispense Refill   amLODipine  (NORVASC ) 5 MG tablet Take 1 tablet (5 mg total) by mouth daily. 90 tablet 3   aspirin  EC 81 MG tablet Take 1 tablet (81 mg total) by mouth daily. Swallow whole. 30 tablet 12   buPROPion  ER (WELLBUTRIN  SR) 100 MG 12 hr tablet Take 1 tablet (100 mg total) by mouth daily. 90 tablet 4   Calcium  Carb-Cholecalciferol (CALCIUM  600+D) 600-800 MG-UNIT TABS Take by mouth daily. 2 capsule daily     clopidogrel  (PLAVIX ) 75 MG tablet Take 1 tablet (75 mg total)  by mouth daily. 90 tablet 0   COLLAGEN PO Take by mouth.     Cyanocobalamin (B-12) 2500 MCG TABS Take 1 tablet by mouth daily. Pt taking 1/2 of a 5000 mcg tablet     donepezil  (ARICEPT ) 10 MG tablet Take 10 mg by mouth at bedtime.      doxycycline  (VIBRA -TABS) 100 MG tablet Take 1 tablet (100 mg total) by mouth 2 (two) times daily. 14 tablet 0   famotidine  (PEPCID ) 20 MG tablet Take 1 tablet (20 mg total) by mouth 2 (two) times daily. For heartburn/indigestion 180 tablet 4   fluticasone  (FLONASE ) 50 MCG/ACT nasal spray instill TWO SPRAYS in each nostril daily (shake gently) 48 g 3   fluticasone -salmeterol (WIXELA INHUB) 250-50 MCG/ACT AEPB Inhale 1 puff into the lungs in the morning and at bedtime. 60 each 11   gabapentin  (NEURONTIN ) 100 MG capsule Take 1 capsule (100 mg total) by mouth in the morning.     meclizine  (ANTIVERT ) 25 MG tablet Take 1 tablet (25 mg total) by mouth 3 (three) times daily as needed for dizziness. 30 tablet 0   memantine  (NAMENDA ) 10 MG tablet Take 10 mg by mouth 2 (two) times daily.     mirabegron  ER (MYRBETRIQ ) 25 MG TB24 tablet Take 1 tablet (25 mg  total) by mouth daily. 90 tablet 3   Multiple Vitamin (MULTIVITAMIN) tablet Take 1 tablet by mouth daily.     pantoprazole  (PROTONIX ) 40 MG tablet Take 40 mg by mouth daily.     rosuvastatin  (CRESTOR ) 20 MG tablet Take 1 tablet (20 mg total) by mouth daily. 30 tablet 1   Study - OCEANIC-STROKE - asundexian 50 mg or placebo tablet (PI-Sethi) Take 1 tablet (50 mg total) by mouth daily. 30 tablet 0   No facility-administered medications prior to visit.     Per HPI unless specifically indicated in ROS section below Review of Systems  Objective:  BP 138/70   Pulse 67   Temp 98.1 F (36.7 C) (Oral)   Ht 5' 2 (1.575 m)   Wt 138 lb 2 oz (62.7 kg)   SpO2 95%   BMI 25.26 kg/m   Wt Readings from Last 3 Encounters:  04/16/24 138 lb 2 oz (62.7 kg)  02/23/24 138 lb (62.6 kg)  01/21/24 138 lb (62.6 kg)      Physical  Exam Vitals and nursing note reviewed.  Constitutional:      Appearance: Normal appearance. She is not ill-appearing.  Musculoskeletal:        General: No swelling or tenderness. Normal range of motion.     Right lower leg: No edema.     Left lower leg: No edema.     Comments:  2+ DP bilaterally FROM bilateral knees and ankles No popliteal fullness Neg seated SLR bilaterally No pain to palpation right lateral leg  Mild discomfort to palpation left lateral mid leg below fibula No palpable cords Scars to left lateral leg from prior cyst excisions   Skin:    General: Skin is warm and dry.     Findings: No rash.  Neurological:     Mental Status: She is alert.     Comments: Sensation intact  Psychiatric:        Mood and Affect: Mood normal.        Behavior: Behavior normal.       Results for orders placed or performed in visit on 04/16/24  CK   Collection Time: 04/16/24  4:30 PM  Result Value Ref Range   Total CK 148 18 - 225 U/L  Magnesium   Collection Time: 04/16/24  4:30 PM  Result Value Ref Range   Magnesium 2.2 1.5 - 2.5 mg/dL  CBC with Differential/Platelet   Collection Time: 04/16/24  4:30 PM  Result Value Ref Range   WBC 6.4 3.8 - 10.8 Thousand/uL   RBC 4.09 3.80 - 5.10 Million/uL   Hemoglobin 12.4 11.7 - 15.5 g/dL   HCT 61.5 64.9 - 54.9 %   MCV 93.9 80.0 - 100.0 fL   MCH 30.3 27.0 - 33.0 pg   MCHC 32.3 32.0 - 36.0 g/dL   RDW 87.4 88.9 - 84.9 %   Platelets 233 140 - 400 Thousand/uL   MPV 9.5 7.5 - 12.5 fL   Neutro Abs 2,560 1,500 - 7,800 cells/uL   Absolute Lymphocytes 3,098 850 - 3,900 cells/uL   Absolute Monocytes 531 200 - 950 cells/uL   Eosinophils Absolute 173 15 - 500 cells/uL   Basophils Absolute 38 0 - 200 cells/uL   Neutrophils Relative % 40 %   Total Lymphocyte 48.4 %   Monocytes Relative 8.3 %   Eosinophils Relative 2.7 %   Basophils Relative 0.6 %  Basic metabolic panel with GFR   Collection Time: 04/16/24  4:30 PM  Result Value Ref Range    Glucose, Bld 103 (H) 65 - 99 mg/dL   BUN 18 7 - 25 mg/dL   Creat 9.17 9.39 - 8.99 mg/dL   eGFR 73 > OR = 60 fO/fpw/8.26f7   BUN/Creatinine Ratio SEE NOTE: 6 - 22 (calc)   Sodium 144 135 - 146 mmol/L   Potassium 4.1 3.5 - 5.3 mmol/L   Chloride 108 98 - 110 mmol/L   CO2 28 20 - 32 mmol/L   Calcium  9.8 8.6 - 10.4 mg/dL  Sedimentation rate   Collection Time: 04/16/24  4:30 PM  Result Value Ref Range   Sed Rate 6 0 - 30 mm/h    Lab Results  Component Value Date   CHOL 139 08/27/2023   HDL 74 08/27/2023   LDLCALC 54 08/27/2023   TRIG 55 08/27/2023   CHOLHDL 1.9 08/27/2023   Lab Results  Component Value Date   TSH 0.75 07/02/2023    Assessment & Plan:   Problem List Items Addressed This Visit     Ganglion cyst   Mild dementia (HCC)   Mild, mixed, continue aricept  and namenda .       Bilateral leg pain - Primary   Story/exam suspicious for med related myalgia - check CPK in statin use and inflammatory marker, check Mg. 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  Possible post-surgical nerve irritation on left however no prior surgeries on right.  Start coenzyme Q10 supplement 100mg  daily with meal, update with effect.  Update further based on lab results.       Relevant Orders   CK (Completed)   Magnesium (Completed)   CBC with Differential/Platelet (Completed)   Basic metabolic panel with GFR (Completed)   Sedimentation rate (Completed)   Bilateral hand pain   Bilateral 3rd PIP joint pain, swelling, stiffness, progressive over the past 6+ months  Exam most suspicious for progressive osteoarthritis, without nodules or signs of active synovitis.  Supportive measures reviewed - rec regular topical voltaren use.  Update if not improving with treatment.       Other Visit Diagnoses       Special screening for malignant neoplasms, colon        Relevant Orders   Cologuard        No orders of the defined types were placed in this encounter.   Orders Placed This Encounter  Procedures   Cologuard   CK   Magnesium   CBC with Differential/Platelet   Basic metabolic panel with GFR   Sedimentation rate    Patient Instructions  For hands - likely osteoarthritis related changes to proximal joint of middle fingers. May use tylenol , topical voltaren three times daily as needed.  For legs - unsure cause. I'd like to check muscle test to ensure not a side effect to the rosuvastatin .  Let us  know if worsening symptoms.   Follow up plan: Return if symptoms worsen or fail to improve.  Anton Blas, MD

## 2024-04-17 DIAGNOSIS — M79642 Pain in left hand: Secondary | ICD-10-CM | POA: Insufficient documentation

## 2024-04-17 LAB — BASIC METABOLIC PANEL WITH GFR
BUN: 18 mg/dL (ref 7–25)
CO2: 28 mmol/L (ref 20–32)
Calcium: 9.8 mg/dL (ref 8.6–10.4)
Chloride: 108 mmol/L (ref 98–110)
Creat: 0.82 mg/dL (ref 0.60–1.00)
Glucose, Bld: 103 mg/dL — ABNORMAL HIGH (ref 65–99)
Potassium: 4.1 mmol/L (ref 3.5–5.3)
Sodium: 144 mmol/L (ref 135–146)
eGFR: 73 mL/min/1.73m2 (ref >=60)

## 2024-04-17 LAB — CBC WITH DIFFERENTIAL/PLATELET
Absolute Lymphocytes: 3098 {cells}/uL (ref 850–3900)
Absolute Monocytes: 531 {cells}/uL (ref 200–950)
Basophils Absolute: 38 {cells}/uL (ref 0–200)
Basophils Relative: 0.6 %
Eosinophils Absolute: 173 {cells}/uL (ref 15–500)
Eosinophils Relative: 2.7 %
HCT: 38.4 % (ref 35.0–45.0)
Hemoglobin: 12.4 g/dL (ref 11.7–15.5)
MCH: 30.3 pg (ref 27.0–33.0)
MCHC: 32.3 g/dL (ref 32.0–36.0)
MCV: 93.9 fL (ref 80.0–100.0)
MPV: 9.5 fL (ref 7.5–12.5)
Monocytes Relative: 8.3 %
Neutro Abs: 2560 {cells}/uL (ref 1500–7800)
Neutrophils Relative %: 40 %
Platelets: 233 Thousand/uL (ref 140–400)
RBC: 4.09 Million/uL (ref 3.80–5.10)
RDW: 12.5 % (ref 11.0–15.0)
Total Lymphocyte: 48.4 %
WBC: 6.4 Thousand/uL (ref 3.8–10.8)

## 2024-04-17 LAB — SEDIMENTATION RATE: Sed Rate: 6 mm/h (ref 0–30)

## 2024-04-17 LAB — CK: Total CK: 148 U/L (ref 18–225)

## 2024-04-17 LAB — MAGNESIUM: Magnesium: 2.2 mg/dL (ref 1.5–2.5)

## 2024-04-17 NOTE — Assessment & Plan Note (Signed)
 Bilateral 3rd PIP joint pain, swelling, stiffness, progressive over the past 6+ months  Exam most suspicious for progressive osteoarthritis, without nodules or signs of active synovitis.  Supportive measures reviewed - rec regular topical voltaren use.  Update if not improving with treatment.

## 2024-04-17 NOTE — Assessment & Plan Note (Addendum)
 Mild, mixed, continue aricept  and namenda .

## 2024-04-17 NOTE — Assessment & Plan Note (Addendum)
 Story/exam suspicious for med related myalgia - check CPK in statin use and inflammatory marker, check Mg. 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  Possible post-surgical nerve irritation on left however no prior surgeries on right.  Start coenzyme Q10 supplement 100mg  daily with meal, update with effect.  Update further based on lab results.

## 2024-04-20 ENCOUNTER — Ambulatory Visit: Admitting: Family Medicine

## 2024-04-23 ENCOUNTER — Ambulatory Visit: Payer: Self-pay | Admitting: Family Medicine

## 2024-04-23 DIAGNOSIS — M79604 Pain in right leg: Secondary | ICD-10-CM

## 2024-04-23 DIAGNOSIS — Z1211 Encounter for screening for malignant neoplasm of colon: Secondary | ICD-10-CM | POA: Diagnosis not present

## 2024-04-29 DIAGNOSIS — G4733 Obstructive sleep apnea (adult) (pediatric): Secondary | ICD-10-CM | POA: Diagnosis not present

## 2024-04-29 LAB — COLOGUARD: COLOGUARD: NEGATIVE

## 2024-04-30 ENCOUNTER — Other Ambulatory Visit: Payer: Self-pay

## 2024-04-30 DIAGNOSIS — N3941 Urge incontinence: Secondary | ICD-10-CM

## 2024-04-30 MED ORDER — LIDOCAINE 5 % EX PTCH: 1.0000 | MEDICATED_PATCH | CUTANEOUS | 0 refills | Status: AC

## 2024-04-30 MED ORDER — MIRABEGRON ER 25 MG PO TB24: 25.0000 mg | ORAL_TABLET | Freq: Every day | ORAL | 0 refills | Status: DC

## 2024-04-30 NOTE — Telephone Encounter (Signed)
 Called pt and pt stated that she's at another doctors office and will call office back

## 2024-04-30 NOTE — Telephone Encounter (Signed)
 Received request from husband for lidoderm  patches which have been helpful for leg pain control.  Will see if insurance covers this.

## 2024-04-30 NOTE — Telephone Encounter (Signed)
 E-scribed refill.  Plz schedule annual exam and fasting labs (after 05/12/24) for additional refills.

## 2024-05-05 DIAGNOSIS — I10 Essential (primary) hypertension: Secondary | ICD-10-CM | POA: Diagnosis not present

## 2024-05-05 DIAGNOSIS — E782 Mixed hyperlipidemia: Secondary | ICD-10-CM | POA: Diagnosis not present

## 2024-05-05 DIAGNOSIS — Z8673 Personal history of transient ischemic attack (TIA), and cerebral infarction without residual deficits: Secondary | ICD-10-CM | POA: Diagnosis not present

## 2024-05-05 DIAGNOSIS — R0602 Shortness of breath: Secondary | ICD-10-CM | POA: Diagnosis not present

## 2024-05-05 DIAGNOSIS — R002 Palpitations: Secondary | ICD-10-CM | POA: Diagnosis not present

## 2024-05-12 ENCOUNTER — Other Ambulatory Visit (HOSPITAL_COMMUNITY): Payer: Self-pay

## 2024-05-12 ENCOUNTER — Telehealth: Payer: Self-pay

## 2024-05-12 ENCOUNTER — Ambulatory Visit: Payer: PPO

## 2024-05-12 VITALS — Ht 62.0 in | Wt 138.0 lb

## 2024-05-12 DIAGNOSIS — Z0000 Encounter for general adult medical examination without abnormal findings: Secondary | ICD-10-CM | POA: Diagnosis not present

## 2024-05-12 NOTE — Progress Notes (Signed)
 Subjective:   Kathy Baldwin is a 78 y.o. who presents for a Medicare Wellness preventive visit.  As a reminder, Annual Wellness Visits don't include a physical exam, and some assessments may be limited, especially if this visit is performed virtually. We may recommend an in-person follow-up visit with your provider if needed.  Visit Complete: Virtual I connected with  Kathy Baldwin on 05/12/24 by a audio enabled telemedicine application and verified that I am speaking with the correct person using two identifiers.  Patient Location: Home  Provider Location: Home Office  I discussed the limitations of evaluation and management by telemedicine. The patient expressed understanding and agreed to proceed.  Vital Signs: Because this visit was a virtual/telehealth visit, some criteria may be missing or patient reported. Any vitals not documented were not able to be obtained and vitals that have been documented are patient reported.  VideoDeclined- This patient declined Librarian, academic. Therefore the visit was completed with audio only.  Persons Participating in Visit: Patient.  AWV Questionnaire: No: Patient Medicare AWV questionnaire was not completed prior to this visit.  Cardiac Risk Factors include: advanced age (>62men, >83 women);dyslipidemia;hypertension;sedentary lifestyle     Objective:    Today's Vitals   05/12/24 1344  Weight: 138 lb (62.6 kg)  Height: 5' 2 (1.575 m)   Body mass index is 25.24 kg/m.     05/12/2024    1:52 PM 08/27/2023    3:05 PM 06/23/2023    2:00 PM 06/23/2023    8:25 AM 05/01/2023    2:36 PM 03/13/2022   10:07 AM 09/11/2021    7:02 PM  Advanced Directives  Does Patient Have a Medical Advance Directive? Yes No No No Yes Yes   Type of Estate agent of [place];Living will    Healthcare Power of [place];Living will Healthcare Power of [place];Living will   Does patient want to make  changes to medical advance directive?      No - Patient declined   Copy of Healthcare Power of Attorney in Chart? No - copy requested    No - copy requested No - copy requested   Would patient like information on creating a medical advance directive?  No - Patient declined No - Patient declined    No - Patient declined    Current Medications (verified) Outpatient Encounter Medications as of 05/12/2024  Medication Sig   amLODipine  (NORVASC ) 5 MG tablet Take 1 tablet (5 mg total) by mouth daily.   aspirin  EC 81 MG tablet Take 1 tablet (81 mg total) by mouth daily. Swallow whole.   buPROPion  ER (WELLBUTRIN  SR) 100 MG 12 hr tablet Take 1 tablet (100 mg total) by mouth daily.   Calcium  Carb-Cholecalciferol (CALCIUM  600+D) 600-800 MG-UNIT TABS Take by mouth daily. 2 capsule daily   clopidogrel  (PLAVIX ) 75 MG tablet Take 1 tablet (75 mg total) by mouth daily.   COLLAGEN PO Take by mouth.   Cyanocobalamin (B-12) 2500 MCG TABS Take 1 tablet by mouth daily. Pt taking 1/2 of a 5000 mcg tablet   donepezil  (ARICEPT ) 10 MG tablet Take 10 mg by mouth at bedtime.    doxycycline  (VIBRA -TABS) 100 MG tablet Take 1 tablet (100 mg total) by mouth 2 (two) times daily.   famotidine  (PEPCID ) 20 MG tablet Take 1 tablet (20 mg total) by mouth 2 (two) times daily. For heartburn/indigestion   fluticasone  (FLONASE ) 50 MCG/ACT nasal spray instill TWO SPRAYS in each nostril daily (shake gently)  fluticasone -salmeterol (WIXELA INHUB) 250-50 MCG/ACT AEPB Inhale 1 puff into the lungs in the morning and at bedtime.   gabapentin  (NEURONTIN ) 100 MG capsule Take 1 capsule (100 mg total) by mouth in the morning.   lidocaine  (LIDODERM ) 5 % Place 1 patch onto the skin daily. Remove & Discard patch within 12 hours or as directed by MD   meclizine  (ANTIVERT ) 25 MG tablet Take 1 tablet (25 mg total) by mouth 3 (three) times daily as needed for dizziness.   memantine  (NAMENDA ) 10 MG tablet Take 10 mg by mouth 2 (two) times daily.    mirabegron  ER (MYRBETRIQ ) 25 MG TB24 tablet Take 1 tablet (25 mg total) by mouth daily.   Multiple Vitamin (MULTIVITAMIN) tablet Take 1 tablet by mouth daily.   pantoprazole  (PROTONIX ) 40 MG tablet Take 40 mg by mouth daily.   rosuvastatin  (CRESTOR ) 20 MG tablet Take 1 tablet (20 mg total) by mouth daily.   Study - OCEANIC-STROKE - asundexian 50 mg or placebo tablet (PI-Sethi) Take 1 tablet (50 mg total) by mouth daily.   No facility-administered encounter medications on file as of 05/12/2024.    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 Ruffus 2010   DNR no code (do not resuscitate) 03/20/2017   GERD (gastroesophageal reflux disease)    Hearing loss    History of gallstones    Hyperlipidemia    Hypertension    Hypertrophy of nasal turbinates    Leg mass, left    in calf muscle seeing surgeon tomorrow regarding   Lymphocytosis    Menopause age 65   Multiple food allergies    chocolate and cabbage   OSA on CPAP    Osteopenia 06/2013   Sleep apnea    Stroke (HCC) 09/11/2021   Thyroid  nodule    Vertigo    Vitamin D  deficiency disease    Past Surgical History:  Procedure Laterality Date   BREAST CYST ASPIRATION Right    negative   CARPAL TUNNEL RELEASE Right 2015   CATARACT EXTRACTION W/PHACO Left 09/30/2018   Procedure: CATARACT EXTRACTION PHACO AND INTRAOCULAR LENS PLACEMENT (IOC)  LEFT;  Surgeon: Mittie Gaskin, MD;  Location: [facility];  Service: Ophthalmology;  Laterality: Left;  sleep apnea   CATARACT EXTRACTION W/PHACO Right 10/28/2018   Procedure: CATARACT EXTRACTION PHACO AND INTRAOCULAR LENS PLACEMENT (IOC)  RIGHT;  Surgeon:  Mittie Gaskin, MD;  Location: [facility];  Service: Ophthalmology;  Laterality: Right;   CHOLECYSTECTOMY  1990   COLONOSCOPY  04/25/2008   CYST EXCISION Left 2020   L popliteal/peroneal cyst excision s/p residual numbness   DILATION AND CURETTAGE OF UTERUS  1982   s/p miscarriage   ESOPHAGOGASTRODUODENOSCOPY  08/2020   chronic gastritis, neg H pylori, no Barretts, no rpt needed [facility])   Family History  Problem Relation Age of Onset   Healthy Mother    Arthritis Mother    Lung disease Father        black lung   Heart disease Father    Hyperlipidemia Father    Hypertension Father    Emphysema Sister    Healthy Sister    Cancer  Brother        unsure of type   Asthma Daughter    Heart disease Sister    Thyroid  disease Sister    Stroke Maternal Grandmother    Hypertension Maternal Grandmother    Diabetes Maternal Grandmother    Diabetes Sister    Healthy Sister    Breast cancer Neg Hx    Social History   Socioeconomic History   Marital status: Married    Spouse name: [name]   Number of children: 1   Years of education: Not on file   Highest education level: Bachelor's degree (e.g., BA, AB, BS)  Occupational History   Occupation: Retired  Tobacco Use   Smoking status: Never   Smokeless tobacco: Never   Tobacco comments:    Never smoked, so cannot quit...  Vaping Use   Vaping status: Never Used  Substance and Sexual Activity   Alcohol use: Not Currently    Alcohol/week: 0.0 standard drinks of alcohol    Comment: Occasional celebratory drink (holidays, etc.) only.   Drug use: No   Sexual activity: Yes    Partners: Male    Birth control/protection: Post-menopausal  Other Topics Concern   Not on file  Social History Narrative   Lives with husband [name] reel   Occ: retired Diplomatic Services operational officer   Edu: BA   Act:    Diet:    Social Drivers of Corporate investment banker Strain: Low Risk  (05/12/2024)   Overall Financial Resource Strain (CARDIA)     Difficulty of Paying Living Expenses: Not hard at all  Food Insecurity: No Food Insecurity (05/12/2024)   Hunger Vital Sign    Worried About Running Out of Food in the Last Year: Never true    Ran Out of Food in the Last Year: Never true  Transportation Needs: No Transportation Needs (05/12/2024)   PRAPARE - Administrator, Civil Service (Medical): No    Lack of Transportation (Non-Medical): No  Physical Activity: Inactive (05/12/2024)   Exercise Vital Sign    Days of Exercise per Week: 0 days    Minutes of Exercise per Session: 0 min  Stress: No Stress Concern Present (05/12/2024)   [employer] of Occupational Health - Occupational Stress Questionnaire    Feeling of Stress: Not at all  Social Connections: Moderately Isolated (05/12/2024)   Social Connection and Isolation Panel    Frequency of Communication with Friends and Family: More than three times a week    Frequency of Social Gatherings with Friends and Family: More than three times a week    Attends Religious Services: Never    Database administrator or Organizations: No    Attends Banker Meetings: Never    Marital Status: Married    Tobacco Counseling Counseling given: Not Answered Tobacco comments: Never smoked, so cannot quit...    Clinical Intake:  Pre-visit preparation completed: Yes  Pain : No/denies pain     BMI - recorded: 25.24 Nutritional Status: BMI 25 -29 Overweight Nutritional Risks: None Diabetes: No  Lab Results  Component Value Date   HGBA1C 5.7 (H) 06/24/2023   HGBA1C 5.6 09/11/2021     How often do you need to have someone help you when you read instructions, pamphlets, or other written materials from your doctor or pharmacy?: 1 - Never  Interpreter Needed?: No  Comments: lives with husband Information entered by :: B.Emerie Vanderkolk,LPN   Activities of Daily Living     05/12/2024  1:52 PM 08/27/2023    3:02 PM  In your present state of health, do you have any  difficulty performing the following activities:  Hearing? 1   Vision? 0   Difficulty concentrating or making decisions? 1   Walking or climbing stairs? 0   Dressing or bathing? 0   Doing errands, shopping? 0 0  Preparing Food and eating ? N   Using the Toilet? N   In the past six months, have you accidently leaked urine? N   Do you have problems with loss of bowel control? N   Managing your Medications? N   Managing your Finances? N   Housekeeping or managing your Housekeeping? N     Patient Care Team: Rilla Baller, MD as PCP - General (Family Medicine) Jinny Carmine, MD as Consulting Physician (Gastroenterology) [facility], [facility] (Optometry) Solum, Therisa HERO, MD as Physician Assistant (Endocrinology) Herminio Miu, MD as Consulting Physician (Otolaryngology) Mittie Gaskin, MD as Referring Physician (Ophthalmology) Fate Morna SAILOR, [place] (Inactive) as Pharmacist (Pharmacist)  I have updated your Care Teams any recent Medical Services you may have received from other providers in the past year.     Assessment:   This is a routine wellness examination for Kathy Baldwin.  Hearing/Vision screen Hearing Screening - Comments:: Patient denies any hearing difficulties with aids Vision Screening - Comments:: Pt says their vision is good with glasses [facility]   Goals Addressed               This Visit's Progress     Keep weight down (pt-stated)   On track     05/12/24      Patient Stated   Not on track     05/12/24-Increase walking more        Depression Screen     05/12/2024    1:49 PM 04/16/2024    4:01 PM 02/23/2024    3:26 PM 10/03/2023   11:13 AM 06/02/2023    1:59 PM 05/01/2023    2:35 PM 03/25/2023    2:49 PM  PHQ 2/9 Scores  PHQ - 2 Score 0 0 0 0 0 0 0  PHQ- 9 Score  0  1 0 0 0    Fall Risk     05/12/2024    1:47 PM 04/16/2024    4:00 PM 02/23/2024    3:26 PM 10/03/2023   11:13 AM 06/02/2023    1:59 PM  Fall Risk   Falls in the past year? 0 0 1 1 1    Number falls in past yr: 0 0 0 0 0  Injury with Fall? 0 0 0 0 1  Risk for fall due to : No Fall Risks No Fall Risks History of fall(s)  History of fall(s)  Follow up Education provided;Falls prevention discussed Falls evaluation completed Falls evaluation completed  Falls evaluation completed    MEDICARE RISK AT HOME:  Medicare Risk at Home Any stairs in or around the home?: Yes If so, are there any without handrails?: Yes Home free of loose throw rugs in walkways, pet beds, electrical cords, etc?: Yes Adequate lighting in your home to reduce risk of falls?: Yes Life alert?: No Use of a cane, walker or w/c?: No Grab bars in the bathroom?: Yes Shower chair or bench in shower?: No Elevated toilet seat or a handicapped toilet?: Yes  TIMED UP AND GO:  Was the test performed?  No  Cognitive Function: 6CIT completed        05/12/2024  1:54 PM 05/01/2023    2:37 PM 03/13/2022   10:08 AM 04/17/2018    2:21 PM 03/20/2017   11:27 AM  6CIT Screen  What Year? 0 points 0 points 0 points 0 points 0 points  What month? 0 points 0 points 0 points 0 points 0 points  What time? 0 points 0 points 0 points 0 points 0 points  Count back from 20 0 points 0 points 0 points 0 points 0 points  Months in reverse 4 points 0 points 0 points 0 points 0 points  Repeat phrase 10 points 0 points 0 points 0 points 0 points  Total Score 14 points 0 points 0 points 0 points 0 points    Immunizations Immunization History  Administered Date(s) Administered   Fluad Quad(high Dose 65+) 05/12/2020, 06/08/2021   INFLUENZA, HIGH DOSE SEASONAL PF 06/27/2017, 05/21/2018   Influenza, Quadrivalent, Recombinant, Inj, Pf 07/05/2019   Influenza, Seasonal, Injecte, Preservative Fre 05/25/2015   Influenza-Unspecified 06/12/2011, 05/10/2014, 06/22/2016, 06/27/2017   Moderna Covid-19 Vaccine Bivalent Booster 70yrs & up 06/14/2021   Moderna SARS-COV2 Booster Vaccination 01/26/2021   Moderna Sars-Covid-2 Vaccination  10/22/2019, 11/19/2019, 07/03/2020   Pneumococcal Conjugate-13 11/30/2014   Pneumococcal Polysaccharide-23 08/13/2011, 10/01/2013   Td 06/09/2002   Tdap 09/10/2011, 08/13/2012   Zoster Recombinant(Shingrix) 04/27/2018, 06/29/2018   Zoster, Live 08/13/2012    Screening Tests Health Maintenance  Topic Date Due   DTaP/Tdap/Td (4 - Td or Tdap) 08/13/2022   MAMMOGRAM  03/24/2024   COVID-19 Vaccine (5 - 2025-26 season) 05/10/2024   INFLUENZA VACCINE  12/07/2024 (Originally 04/09/2024)   Medicare Annual Wellness (AWV)  05/12/2025   Pneumococcal Vaccine: 50+ Years  Completed   DEXA SCAN  Completed   Hepatitis C Screening  Completed   Zoster Vaccines- Shingrix  Completed   HPV VACCINES  Aged Out   Meningococcal B Vaccine  Aged Out   Fecal DNA (Cologuard)  Discontinued    Health Maintenance  Health Maintenance Due  Topic Date Due   DTaP/Tdap/Td (4 - Td or Tdap) 08/13/2022   MAMMOGRAM  03/24/2024   COVID-19 Vaccine (5 - 2025-26 season) 05/10/2024   Health Maintenance Items Addressed: None due at this time. Pt will receive vaccines at their pharmacy or in office when decided to obtain   Additional Screening:  Vision Screening: Recommended annual ophthalmology exams for early detection of glaucoma and other disorders of the eye. Would you like a referral to an eye doctor? No    Dental Screening: Recommended annual dental exams for proper oral hygiene  Community Resource Referral / Chronic Care Management: CRR required this visit?  No   CCM required this visit?  No   Plan:    I have personally reviewed and noted the following in the patient's chart:   Medical and social history Use of alcohol, tobacco or illicit drugs  Current medications and supplements including opioid prescriptions. Patient is not currently taking opioid prescriptions. Functional ability and status Nutritional status Physical activity Advanced directives List of other physicians Hospitalizations,  surgeries, and ER visits in previous 12 months Vitals Screenings to include cognitive, depression, and falls Referrals and appointments  In addition, I have reviewed and discussed with patient certain preventive protocols, quality metrics, and best practice recommendations. A written personalized care plan for preventive services as well as general preventive health recommendations were provided to patient.   Kathy LITTIE Saris, LPN   [id number]   After Visit Summary: (MyChart) Due to this being a telephonic visit, the after  visit summary with patients personalized plan was offered to patient via MyChart   Notes: Nothing significant to report at this time.

## 2024-05-12 NOTE — Patient Instructions (Signed)
 Kathy Baldwin , Thank you for taking time out of your busy schedule to complete your Annual Wellness Visit with me. I enjoyed our conversation and look forward to speaking with you again next year. I, as well as your care team,  appreciate your ongoing commitment to your health goals. Please review the following plan we discussed and let me know if I can assist you in the future. Your Game plan/ To Do List    Referrals: If you haven't heard from the office you've been referred to, please reach out to them at the phone provided.   Follow up Visits: We will see or speak with you next year for your Next Medicare AWV with our clinical staff Have you seen your provider in the last 6 months (3 months if uncontrolled diabetes)? Yes  Clinician Recommendations:  Aim for 30 minutes of exercise or brisk walking, 6-8 glasses of water, and 5 servings of fruits and vegetables each day.       This is a list of the screenings recommended for you:  Health Maintenance  Topic Date Due   DTaP/Tdap/Td vaccine (4 - Td or Tdap) 08/13/2022   Mammogram  03/24/2024   COVID-19 Vaccine (5 - 2025-26 season) 05/10/2024   Flu Shot  12/07/2024*   Medicare Annual Wellness Visit  05/12/2025   Pneumococcal Vaccine for age over 49  Completed   DEXA scan (bone density measurement)  Completed   Hepatitis C Screening  Completed   Zoster (Shingles) Vaccine  Completed   HPV Vaccine  Aged Out   Meningitis B Vaccine  Aged Out   Cologuard (Stool DNA test)  Discontinued  *Topic was postponed. The date shown is not the original due date.    Advanced directives: (Copy Requested) Please bring a copy of your health care power of attorney and living will to the office to be added to your chart at your convenience. You can mail to [facility] [address] [place], [place] [address] or email to [email or web address] Advance Care Planning is important because it:  [x]  Makes sure you receive the medical care  that is consistent with your values, goals, and preferences  [x]  It provides guidance to your family and loved ones and reduces their decisional burden about whether or not they are making the right decisions based on your wishes.  Follow the link provided in your after visit summary or read over the paperwork we have mailed to you to help you started getting your Advance Directives in place. If you need assistance in completing these, please reach out to us  so that we can help you!

## 2024-05-12 NOTE — Telephone Encounter (Signed)
 Pharmacy Patient Advocate Encounter   Received notification from Onbase that prior authorization for Lidocaine  5% patches is required/requested.   Insurance verification completed.   The patient is insured through [facility] ADVANTAGE/RX ADVANCE .   Per test claim: Not covered unless being used for post-herpetic neuralgia which I don't see on her chart. Please consider changing therapies.

## 2024-05-13 DIAGNOSIS — M65332 Trigger finger, left middle finger: Secondary | ICD-10-CM | POA: Diagnosis not present

## 2024-05-13 DIAGNOSIS — M65331 Trigger finger, right middle finger: Secondary | ICD-10-CM | POA: Diagnosis not present

## 2024-05-13 NOTE — Telephone Encounter (Signed)
 Please notify patient's husband - insurance denied lidocaine  patches for her pain.  They can pay out of pocket $277 or use over the counter salon pas vs aspercreme patches.

## 2024-05-13 NOTE — Telephone Encounter (Signed)
 Called patient they have got it worked out but wanted to thank us  for the information.

## 2024-05-30 ENCOUNTER — Other Ambulatory Visit: Payer: Self-pay | Admitting: Family Medicine

## 2024-05-30 DIAGNOSIS — N289 Disorder of kidney and ureter, unspecified: Secondary | ICD-10-CM

## 2024-05-30 DIAGNOSIS — E782 Mixed hyperlipidemia: Secondary | ICD-10-CM

## 2024-05-30 DIAGNOSIS — R946 Abnormal results of thyroid function studies: Secondary | ICD-10-CM

## 2024-05-30 DIAGNOSIS — M109 Gout, unspecified: Secondary | ICD-10-CM

## 2024-05-31 ENCOUNTER — Other Ambulatory Visit (INDEPENDENT_AMBULATORY_CARE_PROVIDER_SITE_OTHER)

## 2024-05-31 DIAGNOSIS — R946 Abnormal results of thyroid function studies: Secondary | ICD-10-CM

## 2024-05-31 DIAGNOSIS — E782 Mixed hyperlipidemia: Secondary | ICD-10-CM

## 2024-05-31 DIAGNOSIS — N289 Disorder of kidney and ureter, unspecified: Secondary | ICD-10-CM | POA: Diagnosis not present

## 2024-05-31 DIAGNOSIS — M109 Gout, unspecified: Secondary | ICD-10-CM

## 2024-05-31 LAB — URIC ACID: Uric Acid, Serum: 3.3 mg/dL (ref 2.4–7.0)

## 2024-05-31 LAB — T4, FREE: Free T4: 0.67 ng/dL (ref 0.60–1.60)

## 2024-05-31 LAB — LIPID PANEL
Cholesterol: 156 mg/dL (ref 0–200)
HDL: 70 mg/dL (ref >39.00)
LDL Cholesterol: 76 mg/dL (ref 0–99)
NonHDL: 85.8
Total CHOL/HDL Ratio: 2
Triglycerides: 50 mg/dL (ref 0.0–149.0)
VLDL: 10 mg/dL (ref 0.0–40.0)

## 2024-05-31 LAB — COMPREHENSIVE METABOLIC PANEL WITH GFR
ALT: 16 U/L (ref 0–35)
AST: 27 U/L (ref 0–37)
Albumin: 4.3 g/dL (ref 3.5–5.2)
Alkaline Phosphatase: 76 U/L (ref 39–117)
BUN: 21 mg/dL (ref 6–23)
CO2: 29 meq/L (ref 19–32)
Calcium: 9.8 mg/dL (ref 8.4–10.5)
Chloride: 107 meq/L (ref 96–112)
Creatinine, Ser: 0.96 mg/dL (ref 0.40–1.20)
GFR: 56.16 mL/min — ABNORMAL LOW (ref >60.00)
Glucose, Bld: 100 mg/dL — ABNORMAL HIGH (ref 70–99)
Potassium: 3.9 meq/L (ref 3.5–5.1)
Sodium: 144 meq/L (ref 135–145)
Total Bilirubin: 0.8 mg/dL (ref 0.2–1.2)
Total Protein: 6.5 g/dL (ref 6.0–8.3)

## 2024-05-31 LAB — TSH: TSH: 1.37 u[IU]/mL (ref 0.35–5.50)

## 2024-06-01 ENCOUNTER — Encounter: Payer: Self-pay | Admitting: Pulmonary Disease

## 2024-06-01 ENCOUNTER — Other Ambulatory Visit: Payer: Self-pay | Admitting: Family Medicine

## 2024-06-01 ENCOUNTER — Ambulatory Visit: Payer: Self-pay | Admitting: Family Medicine

## 2024-06-01 ENCOUNTER — Ambulatory Visit: Admitting: Pulmonary Disease

## 2024-06-01 VITALS — BP 130/84 | HR 67 | Temp 97.6°F | Ht 62.0 in | Wt 139.2 lb

## 2024-06-01 DIAGNOSIS — J4530 Mild persistent asthma, uncomplicated: Secondary | ICD-10-CM | POA: Diagnosis not present

## 2024-06-01 DIAGNOSIS — Z8673 Personal history of transient ischemic attack (TIA), and cerebral infarction without residual deficits: Secondary | ICD-10-CM

## 2024-06-01 DIAGNOSIS — G4733 Obstructive sleep apnea (adult) (pediatric): Secondary | ICD-10-CM

## 2024-06-01 DIAGNOSIS — J4520 Mild intermittent asthma, uncomplicated: Secondary | ICD-10-CM

## 2024-06-01 DIAGNOSIS — F0280 Dementia in other diseases classified elsewhere without behavioral disturbance: Secondary | ICD-10-CM

## 2024-06-01 DIAGNOSIS — G5732 Lesion of lateral popliteal nerve, left lower limb: Secondary | ICD-10-CM

## 2024-06-01 LAB — T3: T3, Total: 109 ng/dL (ref 76–181)

## 2024-06-01 NOTE — Patient Instructions (Signed)
 VISIT SUMMARY:  You came in today for a follow-up on your CPAP therapy for obstructive sleep apnea. You have experienced a couple of strokes since your last visit in October 2023 but reported no current breathing problems. You continue to use your CPAP machine every night and during the day when you lie down. You have not required any inhalers recently and plan to discuss getting a flu shot with your other doctor during your upcoming appointment.  YOUR PLAN:  -OBSTRUCTIVE SLEEP APNEA: Obstructive sleep apnea is a condition where your airway becomes blocked during sleep, causing breathing pauses. Your condition is well-managed with your CPAP machine, and you are using it consistently. There are no reported breathing issues or need for inhalers at this time. You are referred to Dr. Jess, a sleep specialist, for a follow-up in six months.  -MILD PERSISTENT ASTHMA: Mild persistent asthma is a condition where your airways are inflamed and can cause breathing difficulties. Currently, you have no asthma symptoms and have not needed any inhalers. Your lungs sounded clear during the examination. If you develop any asthma symptoms, please make an appointment.  -GENERAL HEALTH MAINTENANCE: You have not received a flu shot yet. You plan to get your flu shot at your upcoming appointment with Dr. Rilla. It is important to stay up-to-date with vaccinations to protect your health.  INSTRUCTIONS:  Please follow up with Dr. Jess, the sleep specialist, in four to six months for your CPAP therapy. Also, make sure to get your flu shot at your upcoming appointment with Dr. Rilla on Monday.

## 2024-06-01 NOTE — Progress Notes (Signed)
 Subjective:    Patient ID: Kathy Baldwin, female    DOB: Jun 05, 1946, 78 y.o.   MRN: [id number]  Patient Baldwin Team: Kathy Baller, MD as PCP - General (Family Medicine) Kathy Carmine, MD as Consulting Physician (Gastroenterology) Pa, Kathy Baldwin (Optometry) Solum, Therisa HERO, MD as Physician Assistant (Endocrinology) Kathy Miu, MD as Consulting Physician (Otolaryngology) Kathy Gaskin, MD as Referring Physician (Ophthalmology) Kathy Baldwin, [facility] (Inactive) as Pharmacist (Pharmacist)  Chief Complaint  Patient presents with   Asthma   Obstructive Sleep Apnea    BACKGROUND: Patient is a 78 year old lifelong never smoker who presents for follow-up on the issue of asthma. She has had issues with cerebrovascular accidents in the past.  She has mild residual memory impairment. She also has obstructive sleep CPAP.  I have not seen the patient since 18 June 2022.  Last visit to the clinic was on 07 April 2023 seen by Almarie Ferrari, NP.   HPI Discussed the use of AI scribe software for clinical note transcription with the patient, who gave verbal consent to proceed.  History of Present Illness   Kathy Baldwin is a 78 year old female with obstructive sleep apnea who presents for follow-up of her CPAP therapy.  She presents with her husband Kathy Baldwin.  She has experienced a couple of strokes since her last visit in October 2023. No current breathing problems are reported.  She has developed some mild cognitive decline.  She continues to use her CPAP machine every night and during the day when she lies down. She has not required any inhalers recently and believes she has an emergency inhaler at home.  She has mild intermittent asthma but has not had any issues with this lately.  She has not received a flu shot yet but plans to discuss this with her primary doctor during her upcoming appointment.   She is on CPAP AutoSet 6 to 10 cm H2O, EPR of 3.  Actual  pressures fluctuate between 6.8-9.2.  Residual AHI was 0.1.      DATA 12/07/2021 PFTs: FEV1 1.95 L or 86% predicted, FVC 2.61 L or 78% predicted, FEV1/FVC 74%.  Lung volumes were, normal diffusion capacity normal, mild small airways component.  No significant bronchodilator response.  The results of the testing however are limited due to the patient having a difficult time performing the test in general. 06/24/2023 echocardiogram: LVEF 60 to 65%.Grade 1 diastolic dysfunction.  Trivial mitral regurgitation.  Review of Systems A 10 point review of systems was performed and it is as noted above otherwise negative.   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 Ruffus 2010   DNR no code (do not resuscitate) 03/20/2017   GERD (gastroesophageal reflux disease)    Hearing loss    History of gallstones    Hyperlipidemia    Hypertension    Hypertrophy of nasal turbinates    Leg mass, left    in calf muscle seeing surgeon tomorrow regarding   Lymphocytosis    Menopause age 74   Multiple food allergies    chocolate and cabbage   OSA on CPAP    Osteopenia 06/2013   Sleep apnea    Stroke (HCC)  09/11/2021   Thyroid  nodule    Vertigo    Vitamin D  deficiency disease     Past Surgical History:  Procedure Laterality Date   BREAST CYST ASPIRATION Right    negative   CARPAL TUNNEL RELEASE Right 2015   CATARACT EXTRACTION W/PHACO Left 09/30/2018   Procedure: CATARACT EXTRACTION PHACO AND INTRAOCULAR LENS PLACEMENT (IOC)  LEFT;  Surgeon: Kathy Gaskin, MD;  Location[facility];  Service: Ophthalmology;  Laterality: Left;  sleep apnea   CATARACT EXTRACTION W/PHACO Right 10/28/2018   Procedure: CATARACT EXTRACTION PHACO AND  INTRAOCULAR LENS PLACEMENT (IOC)  RIGHT;  Surgeon: Kathy Gaskin, MD;  Location: [facility];  Service: Ophthalmology;  Laterality: Right;   CHOLECYSTECTOMY  1990   COLONOSCOPY  04/25/2008   CYST EXCISION Left 2020   L popliteal/peroneal cyst excision s/p residual numbness   DILATION AND CURETTAGE OF UTERUS  1982   s/p miscarriage   ESOPHAGOGASTRODUODENOSCOPY  08/2020   chronic gastritis, neg H pylori, no Barretts, no rpt needed [facility])    Patient Active Problem List   Diagnosis Date Noted   Bilateral hand pain 04/17/2024   Bilateral leg pain 04/16/2024   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) 10/03/2023   Abnormal thyroid  function test 07/05/2023   Acute cerebrovascular accident (CVA) due to ischemia (HCC) 06/24/2023   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   Fall with injury 06/02/2023   Right wrist injury, subsequent encounter 06/02/2023   Renal insufficiency 05/09/2023   MDD (major depressive disorder), single episode, mild 01/24/2023   Peroneal nerve palsy, 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   Asthma 11/09/2021   Exertional dyspnea 11/06/2021   Other fatigue 09/26/2021   History of stroke 09/11/2021   Bradycardia 04/27/2021   Urge urinary incontinence 03/06/2021   Medicare annual wellness visit, subsequent 03/05/2021   Advanced directives, counseling/discussion 03/05/2021   Seborrheic keratosis 12/13/2020   Mild dementia (HCC) 11/19/2018   Ganglion cyst 10/23/2018   History of cerebellar stroke 10/08/2018   Small vessel disease, 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   Carotid stenosis, bilateral 10/08/2018   Thyroid  nodule 04/14/2018   Vaginal atrophy 02/18/2017   Aortic atherosclerosis 09/05/2016   Decreased hearing, bilateral 12/04/2015   Health maintenance examination 12/04/2015   Hypertension    ASCUS with positive high risk human papillomavirus of vagina    GERD (gastroesophageal reflux disease)    OSA on CPAP    Hypertrophy of nasal turbinates     Hyperlipidemia 03/28/2015   Allergic rhinitis 03/28/2015   Osteopenia 06/09/2013    Family History  Problem Relation Age of Onset   Healthy Mother    Arthritis Mother    Lung disease Father        black lung   Heart disease Father    Hyperlipidemia Father    Hypertension Father    Emphysema Sister    Healthy Sister    Cancer Brother        unsure of type   Asthma Daughter    Heart disease Sister    Thyroid  disease Sister    Stroke Maternal Grandmother    Hypertension Maternal Grandmother    Diabetes Maternal Grandmother    Diabetes Sister    Healthy Sister    Breast cancer Neg Hx     Social History   Tobacco Use   Smoking status: Never   Smokeless tobacco: Never   Tobacco comments:    Never smoked, so cannot quit...  Substance Use Topics   Alcohol use: Not Currently  Alcohol/week: 0.0 standard drinks of alcohol    Comment: Occasional celebratory drink (holidays, etc.) only.    Allergies  Allergen Reactions   Cabbage Other (See Comments)    Hypersensitivity on allergy testing   Chocolate Other (See Comments)    Hypersensitivity on allergy testing   Statins Other (See Comments)    Zocor and Pravastatin--leg cramps Other reaction(s): Other (See Comments) Zocor and Pravastatin--leg cramps    Current Meds  Medication Sig   aspirin  EC 81 MG tablet Take 1 tablet (81 mg total) by mouth daily. Swallow whole.   Calcium  Carb-Cholecalciferol (CALCIUM  600+D) 600-800 MG-UNIT TABS Take by mouth daily. 2 capsule daily   clopidogrel  (PLAVIX ) 75 MG tablet Take 1 tablet (75 mg total) by mouth daily.   COLLAGEN PO Take by mouth.   Cyanocobalamin (B-12) 2500 MCG TABS Take 1 tablet by mouth daily. Pt taking 1/2 of a 5000 mcg tablet   donepezil  (ARICEPT ) 10 MG tablet Take 10 mg by mouth at bedtime.    meclizine  (ANTIVERT ) 25 MG tablet Take 1 tablet (25 mg total) by mouth 3 (three) times daily as needed for dizziness.   memantine  (NAMENDA ) 10 MG tablet Take 10 mg by mouth 2  (two) times daily.   Multiple Vitamin (MULTIVITAMIN) tablet Take 1 tablet by mouth daily.   rosuvastatin  (CRESTOR ) 20 MG tablet Take 1 tablet (20 mg total) by mouth daily.   [DISCONTINUED] amLODipine  (NORVASC ) 5 MG tablet Take 1 tablet (5 mg total) by mouth daily.   [DISCONTINUED] buPROPion  ER (WELLBUTRIN  SR) 100 MG 12 hr tablet Take 1 tablet (100 mg total) by mouth daily.   [DISCONTINUED] gabapentin  (NEURONTIN ) 100 MG capsule Take 1 capsule (100 mg total) by mouth in the morning.   [DISCONTINUED] mirabegron  ER (MYRBETRIQ ) 25 MG TB24 tablet Take 1 tablet (25 mg total) by mouth daily.   [DISCONTINUED] pantoprazole  (PROTONIX ) 40 MG tablet Take 40 mg by mouth daily.    Immunization History  Administered Date(s) Administered   Fluad Quad(high Dose 65+) 05/12/2020, 06/08/2021, 07/19/2022   INFLUENZA, HIGH DOSE SEASONAL PF 06/27/2017, 05/21/2018, 05/09/2023, 06/07/2024   Influenza, Quadrivalent, Recombinant, Inj, Pf 05/03/2019, 07/05/2019   Influenza, Seasonal, Injecte, Preservative Fre 05/25/2015   Influenza-Unspecified 06/12/2011, 05/10/2014, 06/22/2016, 06/27/2017   Moderna Covid-19 Fall Seasonal Vaccine 86yrs & older 07/19/2022   Moderna Covid-19 Vaccine Bivalent Booster 56yrs & up 06/14/2021   Moderna SARS-COV2 Booster Vaccination 01/26/2021   Moderna Sars-Covid-2 Vaccination 10/22/2019, 11/19/2019, 07/03/2020   PNEUMOCOCCAL CONJUGATE-20 06/07/2024   Pfizer(Comirnaty)Fall Seasonal Vaccine 12 years and older 05/09/2023   Pneumococcal Conjugate-13 11/30/2014   Pneumococcal Polysaccharide-23 08/13/2011, 10/01/2013   Respiratory Syncytial Virus Vaccine,Recomb Aduvanted(Arexvy) 08/14/2022   Td 06/09/2002   Tdap 09/10/2011, 08/13/2012   Zoster Recombinant(Shingrix) 04/27/2018, 06/29/2018   Zoster, Live 08/13/2012        Objective:     BP 130/84   Pulse 67   Temp 97.6 F (36.4 C) (Temporal)   Ht 5' 2 (1.575 m)   Wt 139 lb 3.2 oz (63.1 kg)   SpO2 96%   BMI 25.46 kg/m   SpO2: 96  %  GENERAL: Well-developed, well-nourished woman, no acute distress, fully ambulatory. HEAD: Normocephalic, atraumatic.  EYES: Pupils equal, round, reactive to light.  No scleral icterus.  MOUTH: Dentition intact, oral mucosa moist.  No thrush. NECK: Supple. No thyromegaly. Trachea midline. No JVD.  No adenopathy. PULMONARY: Good air entry bilaterally.  No adventitious sounds. CARDIOVASCULAR: S1 and S2. Regular rate and rhythm.  No rubs, murmurs or gallops  heard. ABDOMEN: Benign. MUSCULOSKELETAL: No joint deformity, no clubbing, no edema.  NEUROLOGIC: Mild residual right-sided hemiparesis speech is fluent. SKIN: Intact,warm,dry. PSYCH: Mildly befuddled.       Assessment & Plan:     ICD-10-CM   1. Mild intermittent asthma without complication  J45.20     2. OSA on CPAP  G47.33     3. History of multiple strokes  Z86.73     4. Mixed dementia (HCC)  G30.9    F01.50    F02.80      Discussion:    Obstructive sleep apnea Obstructive sleep apnea is well-managed with CPAP. Excellent compliance with CPAP usage, as confirmed by device download. No reported breathing issues or need for inhalers. - Refer to Dr. Jess, sleep specialist, for CPAP follow-up in six months.  Mild persistent asthma Mild persistent asthma, currently asymptomatic. No recent asthma symptoms or need for inhalers. Previous asthma issues possibly related to past strokes. Lungs sound clear on examination. - Use albuterol  as needed, call if albuterol  use increases. - Advise to make an appointment if asthma symptoms develop.  General Health Maintenance Flu shot not administered today. Plan to receive flu shot at upcoming appointment with Dr. Rilla. - Advise to see Dr. Rilla for flu shot on upcoming Monday appointment.      Advised if symptoms do not improve or worsen, to please contact office for sooner follow up or seek emergency Baldwin.    I spent 40 minutes of dedicated to the Baldwin of this patient on  the date of this encounter to include pre-visit review of records, face-to-face time with the patient discussing conditions above, post visit ordering of testing, clinical documentation with the electronic health record, making appropriate referrals as documented, and communicating necessary findings to members of the patients Baldwin team.   C. Leita Sanders, MD Advanced Bronchoscopy PCCM [facility]    *This note was dictated using voice recognition software/Dragon.  Despite best efforts to proofread, errors can occur which can change the meaning. Any transcriptional errors that result from this process are unintentional and may not be fully corrected at the time of dictation.

## 2024-06-03 NOTE — Telephone Encounter (Signed)
 Name of Medication: Gabapentin  Name of Pharmacy: [facility]  Last Fill or Written Date and Quantity: 06/02/23 #180 Last Office Visit and Type: 04/16/24 Next Office Visit and Type: 06/07/24

## 2024-06-03 NOTE — Telephone Encounter (Signed)
 ERx

## 2024-06-04 DIAGNOSIS — M65332 Trigger finger, left middle finger: Secondary | ICD-10-CM | POA: Diagnosis not present

## 2024-06-07 ENCOUNTER — Ambulatory Visit: Admitting: Family Medicine

## 2024-06-07 ENCOUNTER — Encounter: Payer: Self-pay | Admitting: Family Medicine

## 2024-06-07 VITALS — BP 132/68 | HR 67 | Temp 98.3°F | Ht 62.5 in | Wt 139.4 lb

## 2024-06-07 DIAGNOSIS — R946 Abnormal results of thyroid function studies: Secondary | ICD-10-CM

## 2024-06-07 DIAGNOSIS — N3941 Urge incontinence: Secondary | ICD-10-CM

## 2024-06-07 DIAGNOSIS — F320 Major depressive disorder, single episode, mild: Secondary | ICD-10-CM | POA: Diagnosis not present

## 2024-06-07 DIAGNOSIS — I10 Essential (primary) hypertension: Secondary | ICD-10-CM | POA: Diagnosis not present

## 2024-06-07 DIAGNOSIS — I6523 Occlusion and stenosis of bilateral carotid arteries: Secondary | ICD-10-CM | POA: Diagnosis not present

## 2024-06-07 DIAGNOSIS — Z0000 Encounter for general adult medical examination without abnormal findings: Secondary | ICD-10-CM

## 2024-06-07 DIAGNOSIS — E782 Mixed hyperlipidemia: Secondary | ICD-10-CM

## 2024-06-07 DIAGNOSIS — N289 Disorder of kidney and ureter, unspecified: Secondary | ICD-10-CM | POA: Diagnosis not present

## 2024-06-07 DIAGNOSIS — M79605 Pain in left leg: Secondary | ICD-10-CM | POA: Diagnosis not present

## 2024-06-07 DIAGNOSIS — G4733 Obstructive sleep apnea (adult) (pediatric): Secondary | ICD-10-CM

## 2024-06-07 DIAGNOSIS — J4520 Mild intermittent asthma, uncomplicated: Secondary | ICD-10-CM | POA: Diagnosis not present

## 2024-06-07 DIAGNOSIS — Z23 Encounter for immunization: Secondary | ICD-10-CM

## 2024-06-07 DIAGNOSIS — Z8673 Personal history of transient ischemic attack (TIA), and cerebral infarction without residual deficits: Secondary | ICD-10-CM

## 2024-06-07 DIAGNOSIS — M79604 Pain in right leg: Secondary | ICD-10-CM | POA: Diagnosis not present

## 2024-06-07 DIAGNOSIS — F03A3 Unspecified dementia, mild, with mood disturbance: Secondary | ICD-10-CM

## 2024-06-07 DIAGNOSIS — K219 Gastro-esophageal reflux disease without esophagitis: Secondary | ICD-10-CM

## 2024-06-07 DIAGNOSIS — Z7189 Other specified counseling: Secondary | ICD-10-CM

## 2024-06-07 DIAGNOSIS — M8589 Other specified disorders of bone density and structure, multiple sites: Secondary | ICD-10-CM

## 2024-06-07 MED ORDER — AMLODIPINE BESYLATE 5 MG PO TABS: 5.0000 mg | ORAL_TABLET | Freq: Every day | ORAL | 3 refills | Status: AC

## 2024-06-07 MED ORDER — BUPROPION HCL ER (SR) 150 MG PO TB12: 150.0000 mg | ORAL_TABLET | Freq: Every day | ORAL | 3 refills | Status: DC

## 2024-06-07 MED ORDER — MIRABEGRON ER 25 MG PO TB24: 25.0000 mg | ORAL_TABLET | Freq: Every day | ORAL | 3 refills | Status: AC

## 2024-06-07 NOTE — Progress Notes (Unsigned)
 Ph: [phone] Fax: [phone]   Patient ID: Kathy Baldwin, female    DOB: 28-Aug-1946, 78 y.o.   MRN: [id number]  This visit was conducted in person.  BP 132/68   Pulse 67   Temp 98.3 F (36.8 C) (Oral)   Ht 5' 2.5 (1.588 m)   Wt 139 lb 6 oz (63.2 kg)   SpO2 97%   BMI 25.09 kg/m    CC: CPE Subjective:   HPI: Kathy Baldwin is a 78 y.o. female presenting on 06/07/2024 for Annual Exam (Had AWV 05/12/24. Pt accompanied by husband, [name].)   Saw health advisor earlier this month for medicare wellness visit. Note reviewed.  Failed cognitive assessment     05/12/2024    1:54 PM 05/01/2023    2:37 PM 03/13/2022   10:08 AM 04/17/2018    2:21 PM 03/20/2017   11:27 AM  6CIT Screen  What Year? 0 points 0 points 0 points 0 points 0 points  What month? 0 points 0 points 0 points 0 points 0 points  What time? 0 points 0 points 0 points 0 points 0 points  Count back from 20 0 points 0 points 0 points 0 points 0 points  Months in reverse 4 points 0 points 0 points 0 points 0 points  Repeat phrase 10 points 0 points 0 points 0 points 0 points  Total Score 14 points 0 points 0 points 0 points 0 points    No results found.  Flowsheet Row Clinical Support from 05/12/2024 in [facility] [place]  PHQ-2 Total Score 0       05/12/2024    1:47 PM 04/16/2024    4:00 PM 02/23/2024    3:26 PM 10/03/2023   11:13 AM 06/02/2023    1:59 PM  Fall Risk   Falls in the past year? 0 0 1 1 1   Number falls in past yr: 0 0 0 0 0  Injury with Fall? 0 0 0 0 1  Risk for fall due to : No Fall Risks No Fall Risks History of fall(s)  History of fall(s)  Follow up Education provided;Falls prevention discussed Falls evaluation completed Falls evaluation completed  Falls evaluation completed   S/p trigger finger surgery by Dr Kathlynn last week.   Bilat leg pain - we started coq10 - they did not try this. Notes improvement actually since trying lidocaine  patches.   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 Also on plavix  and aspirin . Has been referred to [facility] neurology (07/2023).    Established with [facility] clinic cardiology Dr Custovic for HTN.   OSA - continues CPAP followed by pulm. 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 Also with h/o asthma, now in Wixela 250/50mcg 1 puff bid with benefit.   Preventative: Cologuard 03/2018, 03/2021, 04/2024 WNL.  Mammo 03/2023 Birad1 @ [place]  Well woman exam - last pap normal 2017 - desires to age out. No fmhx GYN cancer. DEXA scan - osteopenia managed by endo ([facility]) on Reclast  yearly infusion - stopped ~2021. Consider rpt DEXA 2026.  Lung cancer screening - not eligible  Flu shot -yearly COVID vaccine Moderna 10/2019, 11/2019, booster 06/2020, 01/2021, bivalent 06/2021 Td 2003, Tdap 2013 Prevnar13 2016, pneumovax 23 2012, 2015 , prevnar-20 today  RSV - 08/2022 Zostavax - 2013 Shingrix - 2019 x2 Advanced directive discussion - has at home.  Husband and daughter are HCPOA. Will bring me copy. Unsure if DNR.  Seat belt use discussed  Sunscreen use discussed. No changing moles on skin.  Smoking - none Alcohol  - none Dentist - q6 mo Eye exam - yearly Bowel - no constipation, now off miralax   Bladder - occ urge incontinence, wears pad regularly. No significant stress incontinence symptoms.    Lives with husband [name]  Activity - no regular exercise, does yardwork Diet - good water, fruits/vegetables daily     Relevant past medical, surgical, family and social history reviewed and updated as indicated. Interim medical history since our last visit reviewed. Allergies and medications reviewed and updated. Outpatient Medications Prior to Visit  Medication Sig Dispense Refill   aspirin  EC 81 MG tablet Take 1 tablet (81 mg total) by mouth daily. Swallow whole. 30 tablet 12   Calcium   Carb-Cholecalciferol (CALCIUM  600+D) 600-800 MG-UNIT TABS Take by mouth daily. 2 capsule daily     clopidogrel  (PLAVIX ) 75 MG tablet Take 1 tablet (75 mg total) by mouth daily. 90 tablet 0   COLLAGEN PO Take by mouth.     Cyanocobalamin (B-12) 2500 MCG TABS Take 1 tablet by mouth daily. Pt taking 1/2 of a 5000 mcg tablet     donepezil  (ARICEPT ) 10 MG tablet Take 10 mg by mouth at bedtime.      gabapentin  (NEURONTIN ) 100 MG capsule Take 1 capsule by mouth twice daily 180 capsule 3   lidocaine  (LIDODERM ) 5 % Place 1 patch onto the skin daily. Remove & Discard patch within 12 hours or as directed by MD 30 patch 0   meclizine  (ANTIVERT ) 25 MG tablet Take 1 tablet (25 mg total) by mouth 3 (three) times daily as needed for dizziness. 30 tablet 0   memantine  (NAMENDA ) 10 MG tablet Take 10 mg by mouth 2 (two) times daily.     Multiple Vitamin (MULTIVITAMIN) tablet Take 1 tablet by mouth daily.     rosuvastatin  (CRESTOR ) 20 MG tablet Take 1 tablet (20 mg total) by mouth daily. 30 tablet 1   amLODipine  (NORVASC ) 5 MG tablet Take 1 tablet (5 mg total) by mouth daily. 90 tablet 3   buPROPion  ER (WELLBUTRIN  SR) 100 MG 12 hr tablet Take 1 tablet (100 mg total) by mouth daily. 90 tablet 4   mirabegron  ER (MYRBETRIQ ) 25 MG TB24 tablet Take 1 tablet (25 mg total) by mouth daily. 90 tablet 0   pantoprazole  (PROTONIX ) 40 MG tablet Take 40 mg by mouth daily.     doxycycline  (VIBRA -TABS) 100 MG tablet Take 1 tablet (100 mg total) by mouth 2 (two) times daily. (Patient not taking: Reported on 06/01/2024) 14 tablet 0   famotidine  (PEPCID ) 20 MG tablet Take 1 tablet (20 mg total) by mouth 2 (two) times daily. For heartburn/indigestion (Patient not taking: Reported on 06/01/2024) 180 tablet 4   fluticasone  (FLONASE ) 50 MCG/ACT nasal spray instill TWO SPRAYS in each nostril daily (shake gently) (Patient not taking: Reported on 06/01/2024) 48 g 3   fluticasone -salmeterol (WIXELA INHUB) 250-50 MCG/ACT AEPB Inhale 1 puff into the  lungs in the morning and at bedtime. (Patient not taking: Reported on 06/01/2024) 60 each 11   Study - OCEANIC-STROKE - asundexian 50 mg or placebo tablet (PI-Sethi) Take 1 tablet (50 mg total) by mouth daily. (Patient not taking: Reported on 06/01/2024) 30 tablet 0   No facility-administered medications prior to visit.     Per HPI unless specifically indicated in ROS section below Review  of Systems  Constitutional:  Negative for activity change, appetite change, chills, fatigue, fever and unexpected weight change.  HENT:  Negative for hearing loss.   Eyes:  Negative for visual disturbance.  Respiratory:  Negative for cough, chest tightness, shortness of breath and wheezing.   Cardiovascular:  Negative for chest pain, palpitations and leg swelling.  Gastrointestinal:  Negative for abdominal distention, abdominal pain, blood in stool, constipation, diarrhea, nausea and vomiting.  Genitourinary:  Negative for difficulty urinating and hematuria.  Musculoskeletal:  Negative for arthralgias, myalgias and neck pain.  Skin:  Negative for rash.  Neurological:  Positive for dizziness (imbalance). Negative for seizures, syncope and headaches.  Hematological:  Negative for adenopathy. Does not bruise/bleed easily.  Psychiatric/Behavioral:  Negative for dysphoric mood. The patient is not nervous/anxious.     Objective:  BP 132/68   Pulse 67   Temp 98.3 F (36.8 C) (Oral)   Ht 5' 2.5 (1.588 m)   Wt 139 lb 6 oz (63.2 kg)   SpO2 97%   BMI 25.09 kg/m   Wt Readings from Last 3 Encounters:  06/07/24 139 lb 6 oz (63.2 kg)  06/01/24 139 lb 3.2 oz (63.1 kg)  05/12/24 138 lb (62.6 kg)      Physical Exam Vitals and nursing note reviewed.  Constitutional:      Appearance: Normal appearance. She is not ill-appearing.  HENT:     Head: Normocephalic and atraumatic.     Right Ear: Tympanic membrane, ear canal and external ear normal. There is no impacted cerumen.     Left Ear: Tympanic membrane, ear  canal and external ear normal. There is no impacted cerumen.     Mouth/Throat:     Mouth: Mucous membranes are moist.     Pharynx: Oropharynx is clear. No oropharyngeal exudate or posterior oropharyngeal erythema.  Eyes:     General:        Right eye: No discharge.        Left eye: No discharge.     Extraocular Movements: Extraocular movements intact.     Conjunctiva/sclera: Conjunctivae normal.     Pupils: Pupils are equal, round, and reactive to light.  Neck:     Thyroid : No thyroid  mass or thyromegaly.     Vascular: No carotid bruit.  Cardiovascular:     Rate and Rhythm: Normal rate and regular rhythm.     Pulses: Normal pulses.     Heart sounds: Normal heart sounds. No murmur heard. Pulmonary:     Effort: Pulmonary effort is normal. No respiratory distress.     Breath sounds: Normal breath sounds. No wheezing, rhonchi or rales.  Abdominal:     General: Bowel sounds are normal. There is no distension.     Palpations: Abdomen is soft. There is no mass.     Tenderness: There is no abdominal tenderness. There is no guarding or rebound.     Hernia: No hernia is present.  Musculoskeletal:     Cervical back: Normal range of motion and neck supple. No rigidity.     Right lower leg: No edema.     Left lower leg: No edema.  Lymphadenopathy:     Cervical: No cervical adenopathy.  Skin:    General: Skin is warm and dry.     Findings: No rash.  Neurological:     General: No focal deficit present.     Mental Status: She is alert. Mental status is at baseline.  Psychiatric:  Mood and Affect: Mood normal.        Behavior: Behavior normal.       Results for orders placed or performed in visit on 05/31/24  T4, free   Collection Time: 05/31/24  8:05 AM  Result Value Ref Range   Free T4 0.67 0.60 - 1.60 ng/dL  T3   Collection Time: 05/31/24  8:05 AM  Result Value Ref Range   T3, Total 109 76 - 181 ng/dL  TSH   Collection Time: 05/31/24  8:05 AM  Result Value Ref Range    TSH 1.37 0.35 - 5.50 uIU/mL  Uric acid   Collection Time: 05/31/24  8:05 AM  Result Value Ref Range   Uric Acid, Serum 3.3 2.4 - 7.0 mg/dL  Comprehensive metabolic panel with GFR   Collection Time: 05/31/24  8:05 AM  Result Value Ref Range   Sodium 144 135 - 145 mEq/L   Potassium 3.9 3.5 - 5.1 mEq/L   Chloride 107 96 - 112 mEq/L   CO2 29 19 - 32 mEq/L   Glucose, Bld 100 (H) 70 - 99 mg/dL   BUN 21 6 - 23 mg/dL   Creatinine, Ser 9.03 0.40 - 1.20 mg/dL   Total Bilirubin 0.8 0.2 - 1.2 mg/dL   Alkaline Phosphatase 76 39 - 117 U/L   AST 27 0 - 37 U/L   ALT 16 0 - 35 U/L   Total Protein 6.5 6.0 - 8.3 g/dL   Albumin 4.3 3.5 - 5.2 g/dL   GFR 43.83 (L) >39.99 mL/min   Calcium  9.8 8.4 - 10.5 mg/dL  Lipid panel   Collection Time: 05/31/24  8:05 AM  Result Value Ref Range   Cholesterol 156 0 - 200 mg/dL   Triglycerides 49.9 0.0 - 149.0 mg/dL   HDL 29.99 >60.99 mg/dL   VLDL 89.9 0.0 - 59.9 mg/dL   LDL Cholesterol 76 0 - 99 mg/dL   Total CHOL/HDL Ratio 2    NonHDL 85.80     Assessment & Plan:   Problem List Items Addressed This Visit     Hypertension   Relevant Medications   amLODipine  (NORVASC ) 5 MG tablet   Health maintenance examination - Primary (Chronic)   Preventative protocols reviewed and updated unless pt declined. Discussed healthy diet and lifestyle.       Advanced directives, counseling/discussion (Chronic)   Advanced directive discussion - has at home. Husband and daughter are HCPOA. Will bring me copy. Unsure if DNR.       Urge urinary incontinence   Relevant Medications   mirabegron  ER (MYRBETRIQ ) 25 MG TB24 tablet   MDD (major depressive disorder), single episode, mild   Relevant Medications   buPROPion  ER (WELLBUTRIN  SR) 150 MG 12 hr tablet   Bilateral leg pain   Other Visit Diagnoses       Need for influenza vaccination       Relevant Orders   Flu vaccine HIGH DOSE PF(Fluzone Trivalent) (Completed)     Need for vaccination against Streptococcus  pneumoniae       Relevant Orders   Pneumococcal conjugate vaccine 20-valent (Completed)        Meds ordered this encounter  Medications   amLODipine  (NORVASC ) 5 MG tablet    Sig: Take 1 tablet (5 mg total) by mouth daily.    Dispense:  90 tablet    Refill:  3   buPROPion  ER (WELLBUTRIN  SR) 150 MG 12 hr tablet    Sig: Take 1 tablet (150 mg  total) by mouth daily.    Dispense:  90 tablet    Refill:  3   mirabegron  ER (MYRBETRIQ ) 25 MG TB24 tablet    Sig: Take 1 tablet (25 mg total) by mouth daily.    Dispense:  90 tablet    Refill:  3    Orders Placed This Encounter  Procedures   Flu vaccine HIGH DOSE PF(Fluzone Trivalent)   Pneumococcal conjugate vaccine 20-valent    Patient Instructions  Flu shot today Prevnar-20 today  Call to schedule mammogram Ok to continue lidocaine  patches  Try Coenzyme Q10 100mg  over the ocunter supplement once daily if leg pain returns.  Ensure you're taking rosuvastatin  (Crestor ) for cholesterol control. Ensure you're not taking pantoprazole  40mg  daily  Bring us  copy of your living will/advanced directive, healthcare power of attorney form.  Increase wellbutrin  SR to 150mg  daily in the mornings Good to see you today Return as needed or in 6 months for follow up visit.   Follow up plan: Return in about 6 months (around 12/05/2024) for follow up visit.  Anton Blas, MD

## 2024-06-07 NOTE — Assessment & Plan Note (Signed)
 Advanced directive discussion - has at home. Husband and daughter are HCPOA. Will bring me copy. Unsure if DNR.

## 2024-06-07 NOTE — Assessment & Plan Note (Signed)
 Preventative protocols reviewed and updated unless pt declined. Discussed healthy diet and lifestyle.

## 2024-06-07 NOTE — Patient Instructions (Addendum)
 Flu shot today Prevnar-20 today  Call to schedule mammogram Ok to continue lidocaine  patches  Try Coenzyme Q10 100mg  over the ocunter supplement once daily if leg pain returns.  Ensure you're taking rosuvastatin  (Crestor ) for cholesterol control. Ensure you're not taking pantoprazole  40mg  daily  Bring us  copy of your living will/advanced directive, healthcare power of attorney form.  Increase wellbutrin  SR to 150mg  daily in the mornings Good to see you today Return as needed or in 6 months for follow up visit.

## 2024-06-07 NOTE — Assessment & Plan Note (Signed)
 Now unsure - will remove this from problem list.

## 2024-06-08 NOTE — Assessment & Plan Note (Signed)
 Chronic, stable on rosuvastatin  20mg  daily. The ASCVD Risk score (Arnett DK, et al., 2019) failed to calculate for the following reasons:   Risk score cannot be calculated because patient has a medical history suggesting prior/existing ASCVD

## 2024-06-08 NOTE — Assessment & Plan Note (Signed)
 H/o strokes - now on crestor  20mg  daily, continue aspirin  + plavix .

## 2024-06-08 NOTE — Assessment & Plan Note (Signed)
 Chronic, stable period only on amlodipine  5mg  daily

## 2024-06-08 NOTE — Assessment & Plan Note (Signed)
 Appreciate endo care. Was on reclast  until 2021. Plan rpt DEXA 2026

## 2024-06-08 NOTE — Assessment & Plan Note (Signed)
 Chronic, husband notes some increased depressed mood recently - they request higher wellbutrin  SR dose - will increase from 100mg  to 150mg  daily.

## 2024-06-08 NOTE — Assessment & Plan Note (Addendum)
 Stable period off PPI - they will verify no longer taking PPI.

## 2024-06-08 NOTE — Assessment & Plan Note (Signed)
 -  Continue nightly CPAP

## 2024-06-08 NOTE — Assessment & Plan Note (Signed)
 Notes she's no longer taking wixela/advair nor albuterol  rescue inhaler.

## 2024-06-08 NOTE — Assessment & Plan Note (Signed)
 Latest GFR 56. Will continue to monitor. Now off PPI.

## 2024-06-08 NOTE — Assessment & Plan Note (Signed)
 TFTs remain normal.

## 2024-06-08 NOTE — Assessment & Plan Note (Signed)
 Latest carotid US  stable earlier in the year.

## 2024-06-08 NOTE — Assessment & Plan Note (Signed)
Chronic, stable. Continue myrbetriq.

## 2024-06-08 NOTE — Assessment & Plan Note (Signed)
 Overall improved with lidocaine  patches although insurance did not approve these - did not try CoQ10 supplement.  Stable period off treatment.

## 2024-06-08 NOTE — Assessment & Plan Note (Signed)
 Followed by neurology Anastacio at [facility] aricept  and namenda 

## 2024-06-10 ENCOUNTER — Telehealth: Payer: Self-pay | Admitting: Family Medicine

## 2024-06-10 NOTE — Telephone Encounter (Signed)
 Placed POA in Dr Talmadge box.

## 2024-06-10 NOTE — Telephone Encounter (Signed)
 Patient dropped of POA for provider review placed I providers box

## 2024-07-03 ENCOUNTER — Other Ambulatory Visit: Payer: Self-pay | Admitting: Family Medicine

## 2024-07-03 DIAGNOSIS — F320 Major depressive disorder, single episode, mild: Secondary | ICD-10-CM

## 2024-07-07 MED ORDER — BUPROPION HCL ER (SR) 150 MG PO TB12: 150.0000 mg | ORAL_TABLET | Freq: Every day | ORAL | 3 refills | Status: AC

## 2024-07-30 DIAGNOSIS — G4733 Obstructive sleep apnea (adult) (pediatric): Secondary | ICD-10-CM | POA: Diagnosis not present

## 2024-08-29 ENCOUNTER — Other Ambulatory Visit: Payer: Self-pay | Admitting: Family Medicine

## 2024-08-30 ENCOUNTER — Telehealth: Payer: Self-pay | Admitting: Family Medicine

## 2024-08-30 NOTE — Telephone Encounter (Signed)
 Pt husband called and stated that she is no longeer taking the famotidine  (Pepcid ).

## 2024-08-30 NOTE — Telephone Encounter (Signed)
 Kathy Baldwin was returning the call, advised CMA at lunch and will return call upon return

## 2024-08-30 NOTE — Telephone Encounter (Signed)
 Per 06/07/24 OV, pt expressed no longer taking famotidine  (Pepcid ). Need to find out if pt has resumed med.  Spoke with pt's husband, [name] (on dpr), notifying him of refill request and info above. States he will double check with pt once he's home and call back to let us  if pt has restarted medication or not.

## 2024-08-31 NOTE — Telephone Encounter (Unsigned)
 Copied from CRM #[id number]. Topic: General - Call Back - No Documentation >> Aug 30, 2024  4:05 PM Leah C wrote: Reason for CRM: Patient called back to speak with Dr. KANDICE assistant in regards to wife medication in taking pepcid . Patients husband did not want to provider identifiers at first stating it does not pertain to me. Called [place]

## 2024-08-31 NOTE — Telephone Encounter (Signed)
 Called husband on dpr has restarted would like refill. Ok to send in. Not on med list at this time.

## 2024-08-31 NOTE — Telephone Encounter (Signed)
 ERx

## 2024-09-30 ENCOUNTER — Other Ambulatory Visit: Payer: Self-pay | Admitting: Family Medicine

## 2024-09-30 NOTE — Telephone Encounter (Signed)
 Copied from CRM #[id number]. Topic: Clinical - Medication Refill >> Sep 30, 2024  2:04 PM Kathy Baldwin wrote: Medication:  rosuvastatin  (CRESTOR ) 20 MG tablet  meclizine  (ANTIVERT ) 25 MG tablet    clopidogrel  (PLAVIX ) 75 MG tablet   Has the patient contacted their pharmacy? Yes (Agent: If no, request that the patient contact the pharmacy for the refill. If patient does not wish to contact the pharmacy document the reason why and proceed with request.) (Agent: If yes, when and what did the pharmacy advise?)  This is the patient's preferred pharmacy:  [employer] - [facility] - [place], [place] - [address] [place] [address] Phone: [phone] Fax: [phone]  Is this the correct pharmacy for this prescription? Yes If no, delete pharmacy and type the correct one.   Has the prescription been filled recently? Yes  Is the patient out of the medication? Yes  Has the patient been seen for an appointment in the last year OR does the patient have an upcoming appointment? Yes  Can we respond through MyChart? Yes  Agent: Please be advised that Rx refills may take up to 3 business days. We ask that you follow-up with your pharmacy.

## 2024-10-01 NOTE — Telephone Encounter (Unsigned)
 Copied from CRM [id number]. Topic: Clinical - Medication Refill >> Oct 01, 2024  1:48 PM Suzen RAMAN wrote: Medication: donepezil  (ARICEPT ) 10 MG tablet pantopazole 10 mg famotidine  (PEPCID ) 20 MG tablet meclizine  (ANTIVERT ) 25 MG tablet rosell inhaler 200 mcg/50 mcg  amLODipine  (NORVASC ) 5 MG tablet nemanrine 7 mg  90 Days supplies   Has the patient contacted their pharmacy? Yes   This is the patient's preferred pharmacy:  [email or web address] - [facility] - [place], [place] - [address] [place] [address] Phone: [phone] Fax: [phone]   Is this the correct pharmacy for this prescription? Yes If no, delete pharmacy and type the correct one.   Has the prescription been filled recently? No  Is the patient out of the medication? No  Has the patient been seen for an appointment in the last year OR does the patient have an upcoming appointment? Yes  Can we respond through MyChart? Yes  Agent: Please be advised that Rx refills may take up to 3 business days. We ask that you follow-up with your pharmacy.

## 2024-10-01 NOTE — Telephone Encounter (Signed)
 Don't see were you have given these in a year or over. Some are from other providers.

## 2024-10-02 MED ORDER — ROSUVASTATIN CALCIUM 20 MG PO TABS: 20.0000 mg | ORAL_TABLET | Freq: Every day | ORAL | 3 refills | Status: AC

## 2024-10-02 MED ORDER — CLOPIDOGREL BISULFATE 75 MG PO TABS: 75.0000 mg | ORAL_TABLET | Freq: Every day | ORAL | 3 refills | Status: DC

## 2024-10-02 MED ORDER — MECLIZINE HCL 25 MG PO TABS: 25.0000 mg | ORAL_TABLET | Freq: Three times a day (TID) | ORAL | 0 refills | Status: DC | PRN

## 2024-10-07 ENCOUNTER — Other Ambulatory Visit: Payer: Self-pay | Admitting: Family Medicine

## 2024-10-07 NOTE — Telephone Encounter (Addendum)
 Have reached out to both pharmacies local will send all refills that they have on hand for medications requested. All should be covered. They did send in refill for Meclizine  but it was for #30 and no refills were available. Called husband Voicemail not set up to update on status.

## 2024-10-07 NOTE — Telephone Encounter (Signed)
 Patient husband returned call update given.

## 2024-10-08 ENCOUNTER — Other Ambulatory Visit: Payer: Self-pay | Admitting: Family Medicine

## 2024-10-15 ENCOUNTER — Telehealth: Payer: Self-pay

## 2024-10-15 ENCOUNTER — Other Ambulatory Visit: Payer: Self-pay | Admitting: Family Medicine

## 2024-10-15 MED ORDER — CLOPIDOGREL BISULFATE 75 MG PO TABS: 75.0000 mg | ORAL_TABLET | Freq: Every day | ORAL | 3 refills | Status: AC

## 2024-10-15 MED ORDER — MECLIZINE HCL 25 MG PO TABS: 25.0000 mg | ORAL_TABLET | Freq: Three times a day (TID) | ORAL | 3 refills | Status: AC | PRN

## 2024-10-15 NOTE — Telephone Encounter (Signed)
 Copied from CRM #[id number]. Topic: Clinical - Medication Refill >> Oct 15, 2024 10:56 AM Viola F wrote: Medication: Requesting 90 Day Supply of the:   Meclizine  (ANTIVERT ) 25 MG tablet [[id number]] Pantoprazole  (PROTONIX ) 40 MG tablet [[id number]]  DISCONTINUED Fluticasone -salmeterol (WIXELA INHUB) 250-50 MCG/ACT AEPB [[id number]]  DISCONTINUED  Has the patient contacted their pharmacy? Yes (Agent: If no, request that the patient contact the pharmacy for the refill. If patient does not wish to contact the pharmacy document the reason why and proceed with request.) (Agent: If yes, when and what did the pharmacy advise?)  This is the patient's preferred pharmacy:   [employer] - [facility] - [place], [place] - [address] [place] [address] Phone: [phone] Fax: [phone]  Is this the correct pharmacy for this prescription? Yes If no, delete pharmacy and type the correct one.   Has the prescription been filled recently? Yes  Is the patient out of the medication? No  Has the patient been seen for an appointment in the last year OR does the patient have an upcoming appointment? Yes  Can we respond through MyChart? Yes  Agent: Please be advised that Rx refills may take up to 3 business days. We ask that you follow-up with your pharmacy.

## 2024-10-15 NOTE — Addendum Note (Signed)
 Addended by: RILLA BALLER on: 10/15/2024 05:03 PM   Modules accepted: Orders

## 2024-10-15 NOTE — Telephone Encounter (Unsigned)
 Copied from CRM [id number]. Topic: Clinical - Prescription Issue >> Oct 15, 2024 11:08 AM Rea BROCKS wrote: Reason for CRM: [facility] said prescription transfer denied. They need to be reauthorized by Dr. KANDICE and sent back to [facility].   clopidogrel  (PLAVIX ) 75 MG tablet donepezil  (ARICEPT ) 10 MG tablet  [email or web address] - [facility] - [place], [place] - [address] [place] [address] Phone: [phone] Fax: [phone] Hours: Open 24 hours   [phone] (M)

## 2024-10-15 NOTE — Telephone Encounter (Signed)
 I have refilled plavix  90d supply. I have refilled meclizine  30d supply as this is a PRN medication.   Donepezil  refill request needs to go to [facility] neurology who prescribes this.  I didn't have her on pantoprazole  or wixela - both were previously discontinued. Is she still taking?  I thought she was taking famotidine  (pepcid ) 20mg  bid in place of pantoprazole ?

## 2024-12-06 ENCOUNTER — Ambulatory Visit: Admitting: Family Medicine

## 2025-01-20 ENCOUNTER — Ambulatory Visit (INDEPENDENT_AMBULATORY_CARE_PROVIDER_SITE_OTHER): Admitting: Vascular Surgery

## 2025-01-20 ENCOUNTER — Encounter (INDEPENDENT_AMBULATORY_CARE_PROVIDER_SITE_OTHER)

## 2025-05-13 ENCOUNTER — Ambulatory Visit
